# Patient Record
Sex: Male | Born: 1935 | Race: White | Hispanic: No | Marital: Single | Smoking: Former smoker
Health system: Southern US, Community
[De-identification: ages and names within clinical notes are randomized; demographics above are authoritative.]

## PROBLEM LIST (undated history)

## (undated) DIAGNOSIS — R011 Cardiac murmur, unspecified: Secondary | ICD-10-CM

## (undated) DIAGNOSIS — M2550 Pain in unspecified joint: Secondary | ICD-10-CM

## (undated) DIAGNOSIS — M1990 Unspecified osteoarthritis, unspecified site: Secondary | ICD-10-CM

## (undated) DIAGNOSIS — I10 Essential (primary) hypertension: Secondary | ICD-10-CM

## (undated) DIAGNOSIS — I509 Heart failure, unspecified: Secondary | ICD-10-CM

## (undated) DIAGNOSIS — Z961 Presence of intraocular lens: Secondary | ICD-10-CM

## (undated) DIAGNOSIS — H3560 Retinal hemorrhage, unspecified eye: Secondary | ICD-10-CM

## (undated) DIAGNOSIS — M545 Low back pain: Secondary | ICD-10-CM

## (undated) DIAGNOSIS — H189 Unspecified disorder of cornea: Secondary | ICD-10-CM

## (undated) DIAGNOSIS — D181 Lymphangioma, any site: Secondary | ICD-10-CM

## (undated) DIAGNOSIS — R197 Diarrhea, unspecified: Secondary | ICD-10-CM

## (undated) DIAGNOSIS — K219 Gastro-esophageal reflux disease without esophagitis: Secondary | ICD-10-CM

## (undated) DIAGNOSIS — I4891 Unspecified atrial fibrillation: Secondary | ICD-10-CM

## (undated) DIAGNOSIS — K5900 Constipation, unspecified: Secondary | ICD-10-CM

## (undated) DIAGNOSIS — R42 Dizziness and giddiness: Secondary | ICD-10-CM

## (undated) DIAGNOSIS — J449 Chronic obstructive pulmonary disease, unspecified: Secondary | ICD-10-CM

## (undated) DIAGNOSIS — G4730 Sleep apnea, unspecified: Secondary | ICD-10-CM

## (undated) DIAGNOSIS — H26499 Other secondary cataract, unspecified eye: Secondary | ICD-10-CM

## (undated) DIAGNOSIS — I739 Peripheral vascular disease, unspecified: Secondary | ICD-10-CM

## (undated) DIAGNOSIS — J849 Interstitial pulmonary disease, unspecified: Secondary | ICD-10-CM

## (undated) DIAGNOSIS — H0289 Other specified disorders of eyelid: Secondary | ICD-10-CM

## (undated) HISTORY — PX: REPLACEMENT TOTAL KNEE: SUR1224

## (undated) HISTORY — DX: Pain in unspecified joint: M25.50

## (undated) HISTORY — DX: Sleep apnea, unspecified: G47.30

## (undated) HISTORY — DX: Dizziness and giddiness: R42

## (undated) HISTORY — PX: NASAL SINUS SURGERY: SHX719

## (undated) HISTORY — DX: Low back pain: M54.5

## (undated) HISTORY — PX: SHOULDER SURGERY: SHX246

## (undated) HISTORY — DX: Peripheral vascular disease, unspecified: I73.9

## (undated) HISTORY — PX: APPENDECTOMY: SHX54

## (undated) HISTORY — PX: CARPAL TUNNEL RELEASE: SHX101

## (undated) HISTORY — DX: Essential (primary) hypertension: I10

## (undated) HISTORY — PX: EYE SURGERY: SHX253

## (undated) HISTORY — DX: Other specified disorders of eyelid: H02.89

## (undated) HISTORY — DX: Heart failure, unspecified: I50.9

## (undated) HISTORY — PX: TONSILLECTOMY: SUR1361

## (undated) HISTORY — DX: Diarrhea, unspecified: R19.7

## (undated) HISTORY — DX: Other secondary cataract, unspecified eye: H26.499

## (undated) HISTORY — DX: Constipation, unspecified: K59.00

## (undated) HISTORY — DX: Unspecified disorder of cornea: H18.9

## (undated) HISTORY — DX: Interstitial pulmonary disease, unspecified: J84.9

## (undated) HISTORY — DX: Presence of intraocular lens: Z96.1

## (undated) HISTORY — DX: Retinal hemorrhage, unspecified eye: H35.60

## (undated) HISTORY — PX: BACK SURGERY: SHX140

## (undated) HISTORY — PX: JOINT REPLACEMENT: SHX530

## (undated) HISTORY — DX: Lymphangioma, any site: D18.1

## (undated) HISTORY — PX: HERNIA REPAIR: SHX51

## (undated) HISTORY — DX: Chronic obstructive pulmonary disease, unspecified: J44.9

---

## 2009-04-24 ENCOUNTER — Ambulatory Visit: Payer: Self-pay

## 2009-06-17 ENCOUNTER — Ambulatory Visit: Payer: Self-pay | Admitting: General Practice

## 2009-07-01 ENCOUNTER — Ambulatory Visit: Payer: Self-pay | Admitting: General Practice

## 2009-12-12 ENCOUNTER — Ambulatory Visit: Payer: Self-pay | Admitting: General Practice

## 2010-01-26 ENCOUNTER — Ambulatory Visit: Payer: Self-pay | Admitting: General Practice

## 2010-02-08 ENCOUNTER — Inpatient Hospital Stay: Payer: Self-pay | Admitting: General Practice

## 2010-02-13 ENCOUNTER — Encounter: Payer: Self-pay | Admitting: Internal Medicine

## 2010-02-14 ENCOUNTER — Encounter: Payer: Self-pay | Admitting: Internal Medicine

## 2010-03-04 ENCOUNTER — Other Ambulatory Visit: Payer: Self-pay | Admitting: Internal Medicine

## 2010-06-05 ENCOUNTER — Ambulatory Visit: Payer: Self-pay | Admitting: Internal Medicine

## 2010-07-03 ENCOUNTER — Ambulatory Visit: Payer: Self-pay | Admitting: Urology

## 2010-11-03 DIAGNOSIS — M545 Low back pain, unspecified: Secondary | ICD-10-CM

## 2010-11-03 DIAGNOSIS — I739 Peripheral vascular disease, unspecified: Secondary | ICD-10-CM

## 2010-11-03 DIAGNOSIS — I10 Essential (primary) hypertension: Secondary | ICD-10-CM

## 2010-11-03 DIAGNOSIS — J441 Chronic obstructive pulmonary disease with (acute) exacerbation: Secondary | ICD-10-CM | POA: Insufficient documentation

## 2010-11-03 DIAGNOSIS — J449 Chronic obstructive pulmonary disease, unspecified: Secondary | ICD-10-CM

## 2010-11-03 DIAGNOSIS — Z720 Tobacco use: Secondary | ICD-10-CM | POA: Insufficient documentation

## 2010-11-03 HISTORY — DX: Chronic obstructive pulmonary disease, unspecified: J44.9

## 2010-11-03 HISTORY — DX: Essential (primary) hypertension: I10

## 2010-11-03 HISTORY — DX: Low back pain, unspecified: M54.50

## 2010-11-03 HISTORY — DX: Peripheral vascular disease, unspecified: I73.9

## 2010-12-08 DIAGNOSIS — H189 Unspecified disorder of cornea: Secondary | ICD-10-CM | POA: Insufficient documentation

## 2010-12-08 DIAGNOSIS — Z961 Presence of intraocular lens: Secondary | ICD-10-CM

## 2010-12-08 DIAGNOSIS — H0289 Other specified disorders of eyelid: Secondary | ICD-10-CM | POA: Insufficient documentation

## 2010-12-08 HISTORY — DX: Presence of intraocular lens: Z96.1

## 2010-12-08 HISTORY — DX: Other specified disorders of eyelid: H02.89

## 2010-12-08 HISTORY — DX: Unspecified disorder of cornea: H18.9

## 2011-06-15 DIAGNOSIS — M2550 Pain in unspecified joint: Secondary | ICD-10-CM | POA: Insufficient documentation

## 2011-06-15 DIAGNOSIS — Z0000 Encounter for general adult medical examination without abnormal findings: Secondary | ICD-10-CM | POA: Insufficient documentation

## 2011-06-15 DIAGNOSIS — K5900 Constipation, unspecified: Secondary | ICD-10-CM

## 2011-06-15 HISTORY — DX: Pain in unspecified joint: M25.50

## 2011-06-15 HISTORY — DX: Constipation, unspecified: K59.00

## 2011-12-21 DIAGNOSIS — H26499 Other secondary cataract, unspecified eye: Secondary | ICD-10-CM

## 2011-12-21 DIAGNOSIS — Z9889 Other specified postprocedural states: Secondary | ICD-10-CM | POA: Insufficient documentation

## 2011-12-21 HISTORY — DX: Other secondary cataract, unspecified eye: H26.499

## 2012-06-16 ENCOUNTER — Ambulatory Visit: Payer: Self-pay | Admitting: Ophthalmology

## 2012-06-16 LAB — PROTIME-INR
INR: 2.6
Prothrombin Time: 27.1 secs — ABNORMAL HIGH (ref 11.5–14.7)

## 2012-06-25 ENCOUNTER — Ambulatory Visit: Payer: Self-pay | Admitting: Ophthalmology

## 2012-06-25 LAB — PROTIME-INR
INR: 1.7
Prothrombin Time: 20 secs — ABNORMAL HIGH (ref 11.5–14.7)

## 2012-06-26 ENCOUNTER — Ambulatory Visit: Payer: Self-pay | Admitting: Ophthalmology

## 2012-06-26 LAB — PROTIME-INR
INR: 1.3
Prothrombin Time: 16.6 secs — ABNORMAL HIGH (ref 11.5–14.7)

## 2012-10-10 ENCOUNTER — Emergency Department: Payer: Self-pay | Admitting: Emergency Medicine

## 2012-10-10 LAB — PROTIME-INR
INR: 2.5
Prothrombin Time: 26 secs — ABNORMAL HIGH (ref 11.5–14.7)

## 2012-10-10 LAB — CBC
HCT: 45.3 % (ref 40.0–52.0)
HGB: 15.4 g/dL (ref 13.0–18.0)
MCH: 30.6 pg (ref 26.0–34.0)
MCHC: 34.1 g/dL (ref 32.0–36.0)
MCV: 90 fL (ref 80–100)
Platelet: 183 10*3/uL (ref 150–440)
RBC: 5.04 10*6/uL (ref 4.40–5.90)
RDW: 13.8 % (ref 11.5–14.5)
WBC: 9.4 10*3/uL (ref 3.8–10.6)

## 2012-10-10 LAB — COMPREHENSIVE METABOLIC PANEL
Albumin: 3.7 g/dL (ref 3.4–5.0)
Alkaline Phosphatase: 68 U/L (ref 50–136)
Anion Gap: 5 — ABNORMAL LOW (ref 7–16)
BUN: 19 mg/dL — ABNORMAL HIGH (ref 7–18)
Bilirubin,Total: 0.7 mg/dL (ref 0.2–1.0)
Calcium, Total: 9.1 mg/dL (ref 8.5–10.1)
Chloride: 109 mmol/L — ABNORMAL HIGH (ref 98–107)
Co2: 28 mmol/L (ref 21–32)
Creatinine: 1.12 mg/dL (ref 0.60–1.30)
EGFR (African American): >60
EGFR (Non-African Amer.): >60
Glucose: 101 mg/dL — ABNORMAL HIGH (ref 65–99)
Osmolality: 286 (ref 275–301)
Potassium: 4.1 mmol/L (ref 3.5–5.1)
SGOT(AST): 29 U/L (ref 15–37)
SGPT (ALT): 36 U/L (ref 12–78)
Sodium: 142 mmol/L (ref 136–145)
Total Protein: 7.5 g/dL (ref 6.4–8.2)

## 2013-02-23 ENCOUNTER — Ambulatory Visit: Payer: Self-pay | Admitting: Specialist

## 2013-10-13 DIAGNOSIS — J849 Interstitial pulmonary disease, unspecified: Secondary | ICD-10-CM

## 2013-10-13 HISTORY — DX: Interstitial pulmonary disease, unspecified: J84.9

## 2014-03-16 DIAGNOSIS — H3560 Retinal hemorrhage, unspecified eye: Secondary | ICD-10-CM

## 2014-03-16 HISTORY — DX: Retinal hemorrhage, unspecified eye: H35.60

## 2014-05-13 ENCOUNTER — Ambulatory Visit: Payer: Self-pay | Admitting: Nephrology

## 2014-08-06 NOTE — Op Note (Signed)
PATIENT NAME:  Dennis Savage, Dennis Savage MR#:  [id number] DATE OF BIRTH:  Jul 12, 1929  DATE OF PROCEDURE:  06/26/2012  PROCEDURE PERFORMED:  1. Pars plana vitrectomy of the right eye.  2. Removal of subretinal membrane of the right eye.   PREOPERATIVE DIAGNOSES:  1. Choroidal neovascular membrane.  2. Dense subretinal hemorrhage.   POSTOPERATIVE DIAGNOSES:  1. Choroidal neovascular membrane.  2. Dense subretinal hemorrhage.   PRIMARY SURGEON: Teresa Pelton. Naeem Quillin, MD  ANESTHESIA: Retrobulbar block of the right eye with monitored anesthesia care.   COMPLICATIONS: None.   ESTIMATED BLOOD LOSS: Less than 1 mL.   INDICATIONS FOR PROCEDURE: The patient presented to my office with sudden loss of vision approximately 1-1/2 weeks ago. 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 The patient was noted to have vision of hand motion. Risks, benefits and alternatives of the above procedure were discussed, and the patient wished to proceed.   DETAILS OF PROCEDURE: After informed consent was obtained, the patient was brought into the operative suite at [facility]. The patient was placed in supine position and was given a small dose of Alfenta, and a retrobulbar block was performed on the right eye by the primary surgeon without complications. The right eye was prepped and draped in sterile manner. After lid speculum was inserted, a 25-gauge trocar was placed inferotemporally through displaced conjunctiva in an oblique fashion 3 mm beyond the limbus in the inferotemporal quadrant. Infusion cannula was turned on and inserted through the trocar and secured in position with Steri-Strips. Two more trocars were placed in a similar fashion superotemporally and superonasally. Vitreous cutter and light pipe were introduced, and a core vitrectomy was performed. The vitreous face was elevated off of the retina using suction. This was removed,  and the vitreous was trimmed for 360 degrees. Scleral depressed exam was performed for 360 degrees, and no signs of any breaks or tears could be identified for 360 degrees. A 38-gauge cannula was introduced into the eye, and a single retinal hole was created, and tPA was injected subretinally into the macula propria. Concentration of the tPA was 150 mcg/mL. Approximately somewhere between 0.33 and 0.5 mL was injected. A small amount of subretinal air was then injected in order to provide buoyancy to force the blood down. Once this was completed, another scleral depressed exam was performed. No signs of any breaks or tears could be identified. There did seem to be tracking of the tPA inferiorly. The air bubble tracked inferiorly, proving communication. An 85% air-fluid exchange was performed, and endolaser was introduced. Laser was provided from the equator to the ora serrata inferiorly. Once this was complete, the trocars were removed. The 20% SF6 was used as an Acupuncturist. One sclerotomy was noted to be leaky, and a 6-0 plain gut was placed. All the wounds were then noted to be airtight. Dexamethasone 5 mg was given into the inferior fornix. Avastin 1.25 mg was given in a pars plana fashion. The lid speculum was removed, and pressure in the eye was confirmed to be approximately 15 mmHg. The eye was cleaned, and TobraDex was placed on the eye. A patch and shield were placed over the eye, and the patient was taken to postanesthesia care with instruction to remain head up and a forward head tilt.    ____________________________ Teresa Pelton. Starling Manns, MD mfa:OSi D: 06/26/2012 08:06:31 ET T: 06/26/2012 08:33:31 ET JOB#: [id number]  cc: Teresa Pelton. Starling Manns, MD, <Dictator> Deepti Gunawan F  Jevante Hollibaugh MD ELECTRONICALLY SIGNED 07/02/2012 9:27

## 2014-08-10 ENCOUNTER — Ambulatory Visit
Admit: 2014-08-10 | Disposition: A | Discharge status: Home / Self Care | Payer: Self-pay | Attending: Nephrology | Admitting: Nephrology

## 2014-08-13 ENCOUNTER — Ambulatory Visit
Admit: 2014-08-13 | Disposition: A | Discharge status: Home / Self Care | Payer: Self-pay | Attending: Nephrology | Admitting: Nephrology

## 2014-11-04 ENCOUNTER — Other Ambulatory Visit: Payer: Self-pay | Admitting: Specialist

## 2014-11-04 DIAGNOSIS — J849 Interstitial pulmonary disease, unspecified: Secondary | ICD-10-CM

## 2014-11-11 ENCOUNTER — Ambulatory Visit
Admission: RE | Admit: 2014-11-11 | Discharge: 2014-11-11 | Disposition: A | Discharge status: Home / Self Care | Payer: Medicare Other | Source: Ambulatory Visit | Attending: Specialist | Admitting: Specialist

## 2014-11-11 ENCOUNTER — Other Ambulatory Visit: Payer: Self-pay | Admitting: Specialist

## 2014-11-11 DIAGNOSIS — J849 Interstitial pulmonary disease, unspecified: Secondary | ICD-10-CM | POA: Insufficient documentation

## 2014-11-11 DIAGNOSIS — R911 Solitary pulmonary nodule: Secondary | ICD-10-CM | POA: Insufficient documentation

## 2014-11-11 DIAGNOSIS — I2510 Atherosclerotic heart disease of native coronary artery without angina pectoris: Secondary | ICD-10-CM | POA: Insufficient documentation

## 2014-11-11 DIAGNOSIS — K760 Fatty (change of) liver, not elsewhere classified: Secondary | ICD-10-CM | POA: Insufficient documentation

## 2014-11-17 ENCOUNTER — Other Ambulatory Visit: Payer: Self-pay | Admitting: Specialist

## 2014-11-17 DIAGNOSIS — R911 Solitary pulmonary nodule: Secondary | ICD-10-CM

## 2015-02-17 ENCOUNTER — Emergency Department: Payer: Medicare Other

## 2015-02-17 ENCOUNTER — Encounter: Payer: Self-pay | Admitting: Emergency Medicine

## 2015-02-17 ENCOUNTER — Emergency Department
Admission: EM | Admit: 2015-02-17 | Discharge: 2015-02-17 | Disposition: A | Discharge status: Other Acute Inpatient Hospital | Payer: Medicare Other | Attending: Emergency Medicine | Admitting: Emergency Medicine

## 2015-02-17 DIAGNOSIS — Z7901 Long term (current) use of anticoagulants: Secondary | ICD-10-CM | POA: Diagnosis not present

## 2015-02-17 DIAGNOSIS — I10 Essential (primary) hypertension: Secondary | ICD-10-CM | POA: Insufficient documentation

## 2015-02-17 DIAGNOSIS — R531 Weakness: Secondary | ICD-10-CM | POA: Insufficient documentation

## 2015-02-17 DIAGNOSIS — Z87891 Personal history of nicotine dependence: Secondary | ICD-10-CM | POA: Insufficient documentation

## 2015-02-17 DIAGNOSIS — D181 Lymphangioma, any site: Secondary | ICD-10-CM

## 2015-02-17 DIAGNOSIS — Z79899 Other long term (current) drug therapy: Secondary | ICD-10-CM | POA: Insufficient documentation

## 2015-02-17 DIAGNOSIS — R42 Dizziness and giddiness: Secondary | ICD-10-CM | POA: Diagnosis present

## 2015-02-17 DIAGNOSIS — Z880 Allergy status to penicillin: Secondary | ICD-10-CM | POA: Insufficient documentation

## 2015-02-17 HISTORY — DX: Essential (primary) hypertension: I10

## 2015-02-17 HISTORY — DX: Unspecified atrial fibrillation: I48.91

## 2015-02-17 HISTORY — DX: Lymphangioma, any site: D18.1

## 2015-02-17 LAB — DIFFERENTIAL
Basophils Absolute: 0 10*3/uL (ref 0–0.1)
Basophils Relative: 1 %
Eosinophils Absolute: 0.1 10*3/uL (ref 0–0.7)
Eosinophils Relative: 2 %
Lymphocytes Relative: 30 %
Lymphs Abs: 2.6 10*3/uL (ref 1.0–3.6)
Monocytes Absolute: 0.8 10*3/uL (ref 0.2–1.0)
Monocytes Relative: 10 %
Neutro Abs: 5.1 10*3/uL (ref 1.4–6.5)
Neutrophils Relative %: 59 %

## 2015-02-17 LAB — COMPREHENSIVE METABOLIC PANEL
ALT: 34 U/L (ref 17–63)
AST: 35 U/L (ref 15–41)
Albumin: 4 g/dL (ref 3.5–5.0)
Alkaline Phosphatase: 53 U/L (ref 38–126)
Anion gap: 9 (ref 5–15)
BUN: 26 mg/dL — ABNORMAL HIGH (ref 6–20)
CO2: 26 mmol/L (ref 22–32)
Calcium: 9.2 mg/dL (ref 8.9–10.3)
Chloride: 105 mmol/L (ref 101–111)
Creatinine, Ser: 1.1 mg/dL (ref 0.61–1.24)
GFR calc Af Amer: >60 mL/min (ref >60)
GFR calc non Af Amer: >60 mL/min (ref >60)
Glucose, Bld: 109 mg/dL — ABNORMAL HIGH (ref 65–99)
Potassium: 3.6 mmol/L (ref 3.5–5.1)
Sodium: 140 mmol/L (ref 135–145)
Total Bilirubin: 1.1 mg/dL (ref 0.3–1.2)
Total Protein: 6.9 g/dL (ref 6.5–8.1)

## 2015-02-17 LAB — GLUCOSE, CAPILLARY: Glucose-Capillary: 103 mg/dL — ABNORMAL HIGH (ref 65–99)

## 2015-02-17 LAB — CBC
HCT: 47.2 % (ref 40.0–52.0)
Hemoglobin: 16.1 g/dL (ref 13.0–18.0)
MCH: 31.1 pg (ref 26.0–34.0)
MCHC: 34 g/dL (ref 32.0–36.0)
MCV: 91.3 fL (ref 80.0–100.0)
Platelets: 171 10*3/uL (ref 150–440)
RBC: 5.17 MIL/uL (ref 4.40–5.90)
RDW: 13.1 % (ref 11.5–14.5)
WBC: 8.7 10*3/uL (ref 3.8–10.6)

## 2015-02-17 LAB — APTT: aPTT: 26 seconds (ref 24–36)

## 2015-02-17 LAB — TROPONIN I: Troponin I: <0.03 ng/mL (ref <0.031)

## 2015-02-17 LAB — PROTIME-INR
INR: 0.99
Prothrombin Time: 13.3 seconds (ref 11.4–15.0)

## 2015-02-17 NOTE — ED Provider Notes (Addendum)
[facility] Emergency Department Provider Note  ____________________________________________   I have reviewed the triage vital signs and the nursing notes.   HISTORY  Chief Complaint Dizziness    HPI Dennis Savage is a 81 y.o. male with a history of sinus surgery 16 years ago, on Zarontin for atrial fibrillation, no history of CVA no history of stent placement or CAD that he knows of, does have a history of hypertension presents today complaining of being unsteady on his feet "like I'm drunk". His been going on for 5 days. He's had 2 near events where he almost fell. One where he was half on and off the toilet and the other where he had to lean up against the wall. Neither time that he hit his head. He has no recollected head trauma. He does take xarelto. He does state that over the last 6 months he has had recurrent headaches that come every few days which are significant but usually resolved with Tylenol. These headaches are atypical for him. They're usually on the right side. He has not had one for the last 3 days. The patient has no complaints of focal numbness or weakness, he has very poor vision at baseline but there's been no change in that, he has no history of difficulty speaking or other focal neurologic deficit. The patient does state a review of systems and over the last month he has been sometimes winded when he walks around, and occasionally has pain in his shoulders when he walks but he is not having that at this time. He denies any fever or chills or stiff neck. He has no drainage from his nose.  Past Medical History  Diagnosis Date  . Hypertension   . Atrial fibrillation ([place])     There are no active problems to display for this patient.   Past Surgical History  Procedure Laterality Date  . Shoulder surgery    . Replacement total knee    . Tonsillectomy    . Hernia repair    . Back surgery    . Appendectomy      Current Outpatient Rx   Name  Route  Sig  Dispense  Refill  . amLODipine (NORVASC) 10 MG tablet   Oral   Take 10 mg by mouth daily.         . chlorthalidone (HYGROTON) 25 MG tablet   Oral   Take 25 mg by mouth daily as needed.         . docusate sodium (COLACE) 250 MG capsule   Oral   Take 250 mg by mouth daily as needed for constipation.         . Multiple Vitamins-Minerals (PRESERVISION AREDS 2) CAPS   Oral   Take 2 capsules by mouth daily.         . rivaroxaban (XARELTO) 20 MG TABS tablet   Oral   Take 20 mg by mouth daily with supper.           Allergies Penicillins  History reviewed. No pertinent family history.  Social History Social History  Substance Use Topics  . Smoking status: Former Research scientist (life sciences)  . Smokeless tobacco: None  . Alcohol Use: No    Review of Systems Constitutional: No fever/chills Eyes: No visual changes. ENT: No sore throat. No stiff neck no neck pain Cardiovascular: Denies chest pain. Respiratory: Denies shortness of breath. Gastrointestinal:   no vomiting.  No diarrhea.  No constipation. Genitourinary: Negative for dysuria. Musculoskeletal: Negative lower  extremity swelling Skin: Negative for rash. Neurological: Negative for headaches, focal weakness or numbness. 10-point ROS otherwise negative.  ____________________________________________   PHYSICAL EXAM:  VITAL SIGNS: ED Triage Vitals  Enc Vitals Group     BP 02/17/15 1043 136/72 mmHg     Pulse Rate 02/17/15 1043 83     Resp 02/17/15 1043 18     Temp 02/17/15 1043 98 F (36.7 C)     Temp Source 02/17/15 1043 Oral     SpO2 02/17/15 1043 94 %     Weight 02/17/15 1043 217 lb (98.431 kg)     Height 02/17/15 1043 5' 9.5" (1.765 m)     Head Cir --      Peak Flow --      Pain Score --      Pain Loc --      Pain Edu? --      Excl. in [place]? --     Constitutional: Alert and oriented. Well appearing and in no acute distress. Eyes: Conjunctivae are normal. PERRL. EOMI. Head: Atraumatic. Nose:  No congestion/rhinnorhea. Mouth/Throat: Mucous membranes are moist.  Oropharynx non-erythematous. Neck: No stridor.   Nontender with no meningismus Cardiovascular: Normal rate, regular rhythm. Grossly normal heart sounds.  Good peripheral circulation. Respiratory: Normal respiratory effort.  No retractions. Lungs CTAB. Abdominal: Soft and nontender. No distention. No guarding no rebound Back:  There is no focal tenderness or step off there is no midline tenderness there are no lesions noted. there is no CVA tenderness Musculoskeletal: No lower extremity tenderness. No joint effusions, no DVT signs strong distal pulses no edema Cranial nerves II through XII are grossly intact 5 out of 5 strength bilateral upper and lower extremity. Finger to nose within normal limits heel to shin within normal limits, speech is normal with no word finding difficulty ordered dysarthria, reflexes symmetric pupils are equally round and reactive to light there is no pronator drift, sensation is normal, normal neurologic exam negative Romberg Skin:  Skin is warm, dry and intact. No rash noted. Psychiatric: Mood and affect are normal. Speech and behavior are normal.  ____________________________________________   LABS (all labs ordered are listed, but only abnormal results are displayed)  Labs Reviewed  COMPREHENSIVE METABOLIC PANEL - Abnormal; Notable for the following:    Glucose, Bld 109 (*)    BUN 26 (*)    All other components within normal limits  GLUCOSE, CAPILLARY - Abnormal; Notable for the following:    Glucose-Capillary 103 (*)    All other components within normal limits  PROTIME-INR  APTT  CBC  DIFFERENTIAL  TROPONIN I  CBG MONITORING, ED   ____________________________________________  EKG  I personally interpreted EKG, atrial fibrillation with occasional PVC. Right bundle-branch block noted no acute ischemic changes rate is  72 ____________________________________________  RADIOLOGY  Personally reviewed the films ____________________________________________   PROCEDURES  Procedure(s) performed: None  Critical Care performed: None  ____________________________________________   INITIAL IMPRESSION / ASSESSMENT AND PLAN / ED COURSE  Pertinent labs & imaging results that were available during my care of the patient were reviewed by me and considered in my medical decision making (see chart for details).  Patient is neurological intact however he is on a blood thinner and he has a headache from a suggested that he probably has had a headache injury in the recent past. He has also had extensive sinus surgery, there is no evidence of a sign CSF leak. He also has been having symptoms of lightheadedness and difficulty  with ambulation. This could be secondary to the hygroma or other pathology. The patient additionally complains of exertional dyspnea and occasional shoulder pain when he walks as well. He is not having any symptoms of angina at this time. For these and many reasons I think the patient probably would benefit from admission for observation in the hospital. However we do not have neurosurgery here. I will discuss with [facility] with her thoughts are.  ----------------------------------------- 1:48 PM on 02/17/2015 ----------------------------------------- Given that there is no clear inciting cause for this and patient did have a significant headache last week the concern exists for possible spontaneous subdural bleed although again trauma is the most likely. Patient has no recollection of a trauma however and he does seem to be quite with it. I did discuss with the hospitalist here and they feel he would be better served in place with neurosurgical backup. This is not unreasonable. I then talked to Dr. Forbes Cellar at [facility] ED who very graciously accepted the patient onto her service. 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     ____________________________________________   FINAL CLINICAL IMPRESSION(S) / ED DIAGNOSES  Final diagnoses:  None     Schuyler Amor, MD 02/17/15 [place], MD 02/17/15 1349

## 2015-02-17 NOTE — ED Notes (Signed)
Patient has been dizzy and lightheaded for 5 days and off balance.  Has fallen twice since sx started.  C/o weakness in both arms.  No speech changes.

## 2015-02-25 DIAGNOSIS — R42 Dizziness and giddiness: Secondary | ICD-10-CM

## 2015-02-25 DIAGNOSIS — R197 Diarrhea, unspecified: Secondary | ICD-10-CM

## 2015-02-25 HISTORY — DX: Diarrhea, unspecified: R19.7

## 2015-02-25 HISTORY — DX: Dizziness and giddiness: R42

## 2015-07-06 ENCOUNTER — Ambulatory Visit: Payer: Self-pay | Admitting: Urology

## 2015-07-13 ENCOUNTER — Ambulatory Visit (INDEPENDENT_AMBULATORY_CARE_PROVIDER_SITE_OTHER): Payer: Medicare Other | Admitting: Urology

## 2015-07-13 ENCOUNTER — Encounter: Payer: Self-pay | Admitting: Urology

## 2015-07-13 VITALS — BP 149/77 | HR 97 | Ht 69.5 in | Wt 211.0 lb

## 2015-07-13 DIAGNOSIS — N400 Enlarged prostate without lower urinary tract symptoms: Secondary | ICD-10-CM | POA: Diagnosis not present

## 2015-07-13 DIAGNOSIS — Q6100 Congenital renal cyst, unspecified: Secondary | ICD-10-CM

## 2015-07-13 DIAGNOSIS — Z9889 Other specified postprocedural states: Secondary | ICD-10-CM

## 2015-07-13 DIAGNOSIS — N281 Cyst of kidney, acquired: Secondary | ICD-10-CM

## 2015-07-13 DIAGNOSIS — I4891 Unspecified atrial fibrillation: Secondary | ICD-10-CM | POA: Insufficient documentation

## 2015-07-13 DIAGNOSIS — R351 Nocturia: Secondary | ICD-10-CM

## 2015-07-13 DIAGNOSIS — G4730 Sleep apnea, unspecified: Secondary | ICD-10-CM

## 2015-07-13 DIAGNOSIS — N50811 Right testicular pain: Secondary | ICD-10-CM | POA: Diagnosis not present

## 2015-07-13 HISTORY — DX: Sleep apnea, unspecified: G47.30

## 2015-07-13 LAB — URINALYSIS, COMPLETE
Bilirubin, UA: NEGATIVE
Glucose, UA: NEGATIVE
Ketones, UA: NEGATIVE
Leukocytes, UA: NEGATIVE
Nitrite, UA: NEGATIVE
Specific Gravity, UA: 1.02 (ref 1.005–1.030)
Urobilinogen, Ur: 0.2 mg/dL (ref 0.2–1.0)
pH, UA: 5 (ref 5.0–7.5)

## 2015-07-13 LAB — MICROSCOPIC EXAMINATION

## 2015-07-13 LAB — BLADDER SCAN AMB NON-IMAGING

## 2015-07-13 MED ORDER — TAMSULOSIN HCL 0.4 MG PO CAPS: 0.4000 mg | ORAL_CAPSULE | Freq: Every day | ORAL | Status: DC

## 2015-07-13 NOTE — Progress Notes (Signed)
07/13/2015 5:28 PM   Dennis Savage 1929/11/13 [id number]  Referring provider: [name], MD [facility] [place], [place] [address]  Chief Complaint  Patient presents with  . Benign Prostatic Hypertrophy    New Patient    HPI: 82 yo M referred by Dr. Gilford Rile for evaluation of nocturia.   Nocturia/ LUTS Patient reports that he gets up 5x nightly over the past month which is increased from 2-3 x.  No significant daytime urgency, frequency, or urge incontinence.  He does have a weak stream and sits to void.  He does sometimes double void and does not feel that he is able to completely emtpy at times.     He does have untreated OSA.  He was dx 15 years ago but was unable to tolerate CPAP. He denies leg swelling. He is currently on a diuretic.  He does avoid drinking in the evening time.  IPSS 17/2 (see below)  PVR 15 mL.    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   No testicular swelling. He puts a pillow between his legs at night to help with the discomfort. He has switched from boxers to boxer briefs.  No dysuria or gross hematuria.  History of bladder lesion He also has a history of ? Transurethral surgery for a bladder lesion by [name] ~4 years ago.  He does not think that he has a  history of bladder cancer. Pathology and surgical dictation unavailable today. We'll request records.   Renal cyst   Patient was told he has renal cysts by his nephrologist (Dr.Singh approximately one year ago). 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 In addition, it appears that there was a hyperattenuating/ hyper enhancing exophytic lesion on  the lower pole of the right kidney measuring 1.9 cm and other complex cysts. Unable to review images today report only.       IPSS      07/13/15 1400       International Prostate Symptom Score   How often have you had the sensation of not emptying your bladder? Less than half the time     How often have you had to urinate less than every two hours? More than half the time     How often have you found you stopped and started again several times when you urinated? Less than half the time     How often have you found it difficult to postpone urination? Less than half the time     How often have you had a weak urinary stream? Less than half the time     How often have you had to strain to start urination? Not at All     How many times did you typically get up at night to urinate? 5 Times     Total IPSS Score 17     Quality of Life due to urinary symptoms   If you were to spend the rest of your life with your urinary condition just the way it is now how would you feel about that? Mostly Satisfied        Score:  1-7 Mild 8-19 Moderate  20-35 Severe   PMH: Past Medical History  Diagnosis Date  . Hypertension   . Atrial fibrillation ([place])   . Benign essential HTN 11/03/2010  . Peripheral vascular disease ([place]) 11/03/2010  . Chronic obstructive pulmonary disease ([place]) 11/03/2010  . Interstitial lung disease ([place]) 10/13/2013  . Acute diarrhea 02/25/2015  . CN (constipation) 06/15/2011  . After cataract not obscuring vision 12/21/2011  . 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 Apnea, sleep 07/13/2015  . Arthralgia of multiple joints 06/15/2011  . Artificial lens present 12/08/2010  . 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 Dizziness 02/25/2015  . LBP (low back pain) 11/03/2010  . 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 Retinal hemorrhage 03/16/2014    Surgical History: Past Surgical History  Procedure Laterality Date  . Shoulder surgery    . Replacement total knee    . Tonsillectomy    . Hernia  repair    . Back surgery    . Appendectomy    . Carpal tunnel release    . Nasal sinus surgery      Home Medications:    Medication List       This list is accurate as of: 07/13/15  5:28 PM.  Always use your most recent med list.               amLODipine 10 MG tablet  Commonly known as:  NORVASC  Take 10 mg by mouth daily.     chlorthalidone 25 MG tablet  Commonly known as:  HYGROTON  Take 25 mg by mouth daily as needed.     docusate sodium 250 MG capsule  Commonly known as:  COLACE  Take 250 mg by mouth daily as needed for constipation.     PRESERVISION AREDS 2 Caps  Take 2 capsules by mouth daily.     tamsulosin 0.4 MG Caps capsule  Commonly known as:  FLOMAX  Take 1 capsule (0.4 mg total) by mouth daily.     XARELTO 20 MG Tabs tablet  Generic drug:  rivaroxaban  Take 20 mg by mouth daily with supper.        Allergies:  Allergies  Allergen Reactions  . Penicillins Swelling    Family History: Family History  Problem Relation Age of Onset  . Bladder Cancer Neg Hx   . Kidney cancer Neg Hx   . Prostate cancer Neg Hx     Social History:  reports that he has quit smoking. He does not have any smokeless tobacco history on file. He reports that he does not drink alcohol or use illicit drugs.  ROS: UROLOGY Frequent Urination?: Yes Hard to postpone urination?: Yes Burning/pain with urination?: No Get up at night to urinate?: Yes Leakage of urine?: No Urine stream starts and stops?: No Trouble starting stream?: No Do you have to strain to urinate?: No Blood in urine?: No Urinary tract infection?: No Sexually transmitted disease?: No Injury to kidneys or bladder?: No Painful intercourse?: No Weak stream?: Yes Erection problems?: No Penile pain?: No  Gastrointestinal Nausea?: No Vomiting?: No Indigestion/heartburn?: No Diarrhea?: No Constipation?: Yes  Constitutional Fever: No Night sweats?: No Weight loss?: No Fatigue?: No  Skin Skin  rash/lesions?: No Itching?: No  Eyes Blurred vision?: Yes Double vision?: No  Ears/Nose/Throat Sore throat?: Yes Sinus problems?: No  Hematologic/Lymphatic Swollen glands?: No Easy bruising?: Yes  Cardiovascular Leg swelling?: No Chest pain?: No  Respiratory Cough?: Yes Shortness of breath?: No  Endocrine Excessive thirst?: No  Musculoskeletal Back pain?: Yes Joint pain?: Yes  Neurological Headaches?: Yes Dizziness?: Yes  Psychologic Depression?: No Anxiety?: No  Physical Exam: BP 149/77 mmHg  Pulse 97  Ht 5' 9.5" (1.765 m)  Wt 211 lb (95.709 kg)  BMI 30.72 kg/m2  Constitutional:  Alert and oriented, No acute distress. HEENT: [place] AT, moist mucus membranes.  Trachea midline, no masses. Cardiovascular: No clubbing, cyanosis, or edema. Respiratory: Normal respiratory effort, no increased work of breathing. GI: Abdomen is soft, nontender, nondistended, no abdominal masses GU: No CVA tenderness. Circumcised phallus.  Mild right testicular tenderness but no evidence of epididymo-orchitis.  Left testicle normal.   Rectal: + external hemorrhoids.  Normal sphincter tone.  20 cc prostate, nontender.   Skin: No rashes, bruises or suspicious lesions. Lymph: No cervical or inguinal adenopathy. Neurologic: Grossly intact, no focal deficits, moving all 4 extremities.  Mild hand tremor. Psychiatric: Normal mood and affect.  Laboratory Data: Lab Results  Component Value Date   WBC 8.7 02/17/2015   HGB 16.1 02/17/2015   HCT 47.2 02/17/2015   MCV 91.3 02/17/2015   PLT 171 02/17/2015    Lab Results  Component Value Date   CREATININE 1.10 02/17/2015    Urinalysis UA today reviewed.  2+ blood on dip but negative RBC on HPF.  3+ protein.  Pertinent Imaging: Study Result     CLINICAL DATA: Left flank pain for 6 days  EXAM: RENAL / URINARY TRACT ULTRASOUND COMPLETE  COMPARISON: 05/13/2014  FINDINGS: Right Kidney:  Length: 13.7 cm. Echogenicity  within normal limits. No mass or hydronephrosis visualized. Two cysts laterally in the lower pole the larger measuring 7 cm.  Left Kidney:  Length: 14.1 cm. Echogenicity within normal limits. No mass or hydronephrosis visualized. Lower pole cyst measures 6 cm.  Bladder:  Normal for degree of distention, but not well evaluated as it was not distended.  IMPRESSION: There are no acute findings. Renal cysts.   Electronically Signed  By: Skipper Cliche M.D.  On: 08/13/2014 16:11   CT abdomen pelvis with contrast11/12/2014  [place]  Result Narrative  CT abdomen and pelvis with IV contrast,      Comparison:  No comparison.  Indication:  R10.84 Generalized abdominal pain, Eval for diverticulitis.  Technique:  CT imaging was performed of the abdomen and pelvis following the uncomplicated administration of intravenous contrast (Isovue-300, 150 mL at 3 mL/sec).  Iodinated contrast was used due to the indications for the examination, to improve disease detection and further define anatomy. The most recent serum creatinine is 1.3 mg/dL.   Coronal reformatted images were generated and reviewed.  Findings:  There is mild dependent atelectasis bilaterally. There are small right-sided pleural calcifications.  The liver is normal in size and contour. There is a 2.0 cm hypoattenuating/hypoenhancing lesion in the mid liver (series 5 image 31), most likely a hepatic cyst. There is a nodular too small to characterize lesion in the inferior posterior right lobe (series 5 image 50). The portal and hepatic veins are patent. There is steatosis of the liver. The gallbladder is fluid-filled without radiopaque stones within the lumen. There is no intrahepatic or extra hepatic biliary ductal dilation. There is central sent fatty infiltration of the pancreas. The spleen is unremarkable. There is a 2.9 cm left adrenal nodule with indeterminate attenuation  characteristics. There is a 2.4 cm right adrenal nodule with heterogeneous fatty attenuation consistent with an adrenal myelolipoma.  There are multiple cysts within the bilateral kidneys. There is a hyperattenuating/hyper enhancing exophytic lesion located on the lower pole  of the right kidney which measures 1.9 cm in diameter. There is the suggestion of a thickened enhancing wall. Additionally there is a 1.9 cm cystic lesion in the superior pole of the left kidney with the suggestion of an enhancing thin septa.  There is a 4 mm calcified stone located in the left upper pole located within the collecting system. There is no hydronephrosis, hydroureter, or evidence of obstruction. The bladder is fluid-filled. The prostate and seminal vesicles are unremarkable.  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 There is thickening of the terminal ileum with increased mural enhancement. The ascending and transverse colon are decompressed, however there is the suggestion of mild bowel wall thickening. The surrounding mesentery is normal in appearance. There are are no intraperitoneal fluid collections. There is no evidence of obstruction.  There are scattered atherosclerotic calcifications of the abdominal aorta and its major branches. The abdominal vasculature is otherwise unremarkable. There is no retroperitoneal or mesenteric adenopathy by CT criteria. There are multilevel degenerative changes of the thoracic and lumbar spine as well as the pelvis. There are no focal osseous lesions.  Impression: 1.Thickening of the terminal ileum, likely enteritis. The colon is decompressed throughout, giving the appearance of wall thickening. Differential considerations include ischemic, infectious and inflammatory etiologies. The mesenteric vasculature is patent. No pneumatosis. 2.Indeterminate right renal lesion. A dedicated CT of the kidneys without and with intravenous  is recommended for further evaluation. 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 This could be further evaluated at the time of dedicated renal mass workup.  Discussed  02/23/2015 8:19 PM by Garlan Fillers, MD with Dr. Rogue Bussing  Electronically Reviewed by:  Garlan Fillers, MD Electronically Reviewed on:  02/24/2015 10:51 AM  I have reviewed the images and concur with the above findings.  Electronically Signed by:  Art Buff, MD Electronically Signed on:  02/24/2015 2:19 PM     Assessment & Plan:    1. Nocturia 82 year old with primary complaint of nocturia up to 5 times only. We discussed the pathophysiology of nocturia and that the etiology is multifactorial. Prostate white small on exam today and is not likely a significant contributing factor.  Emptying bladder well today with PVR. -Recommend avoidance of fluids after 6 PM with a 10 PM bedtime -Discussed importance of compliance with CPAP, have recommended that he follow back up with his PCP and consider repeat sleep study -Discussed nocturnal polyuria as it relates to aging -Will try Flomax for a few weeks to see if this helps, advised to stop if no benefit - Urinalysis, Complete - BLADDER SCAN AMB NON-IMAGING  2. Renal cyst Incidental renal cyst, follow-up CT with contrast at [facility] from 02/2015 somewhat suspicious Recommend follow-up CT abdomen with and without contrast (dedicated renal study) at 6 month interval which would be May for further follow-up/evaluation - CT Abd Wo & W Cm; Future  3. History of transurethral destruction of bladder lesion Unclear history of some sort of transurethral procedure, patient thinks for benign lesion. Records have been requested today  4. Right testicular pain Exam today benign, recommend supportive care   Return in about 2 months (around 09/12/2015) for f/u CT abd, please try to find old [facility] records.  Hollice Espy, MD  [facility] [address], [place], [place] [address] [phone]   I spent 45 min with this patient of which greater than 50% was spent in counseling and coordination of care with the patient.

## 2015-08-24 ENCOUNTER — Ambulatory Visit
Admission: RE | Admit: 2015-08-24 | Discharge: 2015-08-24 | Disposition: A | Discharge status: Home / Self Care | Payer: Medicare Other | Source: Ambulatory Visit | Attending: Urology | Admitting: Urology

## 2015-08-24 DIAGNOSIS — N200 Calculus of kidney: Secondary | ICD-10-CM | POA: Diagnosis not present

## 2015-08-24 DIAGNOSIS — K760 Fatty (change of) liver, not elsewhere classified: Secondary | ICD-10-CM | POA: Diagnosis not present

## 2015-08-24 DIAGNOSIS — N281 Cyst of kidney, acquired: Secondary | ICD-10-CM

## 2015-08-24 DIAGNOSIS — Q6100 Congenital renal cyst, unspecified: Secondary | ICD-10-CM | POA: Diagnosis present

## 2015-08-24 LAB — POCT I-STAT CREATININE: Creatinine, Ser: 1.1 mg/dL (ref 0.61–1.24)

## 2015-08-24 MED ORDER — IOPAMIDOL (ISOVUE-300) INJECTION 61%
100.0000 mL | Freq: Once | INTRAVENOUS | Status: AC | PRN
  Administered 2015-08-24: 100 mL via INTRAVENOUS

## 2015-09-12 DIAGNOSIS — M169 Osteoarthritis of hip, unspecified: Secondary | ICD-10-CM | POA: Insufficient documentation

## 2015-09-14 ENCOUNTER — Ambulatory Visit (INDEPENDENT_AMBULATORY_CARE_PROVIDER_SITE_OTHER): Payer: Medicare Other | Admitting: Urology

## 2015-09-14 ENCOUNTER — Encounter: Payer: Self-pay | Admitting: Urology

## 2015-09-14 VITALS — BP 125/67 | HR 82 | Ht 69.5 in | Wt 212.0 lb

## 2015-09-14 DIAGNOSIS — Q6100 Congenital renal cyst, unspecified: Secondary | ICD-10-CM | POA: Diagnosis not present

## 2015-09-14 DIAGNOSIS — E278 Other specified disorders of adrenal gland: Secondary | ICD-10-CM

## 2015-09-14 DIAGNOSIS — R351 Nocturia: Secondary | ICD-10-CM

## 2015-09-14 DIAGNOSIS — N281 Cyst of kidney, acquired: Secondary | ICD-10-CM

## 2015-09-14 DIAGNOSIS — N200 Calculus of kidney: Secondary | ICD-10-CM | POA: Diagnosis not present

## 2015-09-14 DIAGNOSIS — E279 Disorder of adrenal gland, unspecified: Secondary | ICD-10-CM

## 2015-09-14 NOTE — Progress Notes (Signed)
1:43 PM  09/14/2015  Dennis Savage [birth year] [id number]  Referring provider: [name], MD [facility] [place], [place] [address]  Chief Complaint  Patient presents with  . Renal Cyst    CTscan results    HPI: 82 yo M Who returned today for follow-up of multiple GI issues including: CT abdomen pelvis with and without contrast.  Nocturia/ LUTS Patient reports that he gets up 3x nightly, improved   No significant daytime urgency, frequency, or urge incontinence.  He does have a weak stream and sits to void.  He does sometimes double void and does not feel that he is able to completely emtpy at times.    He does have untreated OSA.  He was dx 15 years ago but was unable to tolerate CPAP. He denies leg swelling. He is currently on a diuretic.   He was prescribed Flomax last visit as a trial but never filled this prescription. There was some confusion about whether or not the prescription had been sent electronically.   Right testicular pain Resolved since last visit  History of bladder lesion He also has a history of ? Transurethral surgery for a bladder lesion by [name] ~4 years ago.  He does not think that he has a  history of bladder cancer. Pathology and surgical dictation unavailable. Unable to locate records.  Renal cyst   Patient was told he has renal cysts by his nephrologist (Dr.Singh approximately one year ago). 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 In addition, it appears that there was a hyperattenuating/ hyper enhancing exophytic lesion on the lower pole of the right kidney measuring 1.9 cm and other complex cysts on 02/2015.  Repeat CT abd/ pelvis with and without contrast (dedicated renal mass protocol) on 08/24/2015, the  indeterminate left lower pole lesion has remained stable in size 19 mm and has equivocal/ low level enhancement.    Nephrolithiasis Incidental 4 mm left upper pole stone on CT scan. Asymptomatic.   PMH: Past Medical History  Diagnosis Date  . Hypertension   . Atrial fibrillation ([place])   . Benign essential HTN 11/03/2010  . Peripheral vascular disease ([place]) 11/03/2010  . Chronic obstructive pulmonary disease ([place]) 11/03/2010  . Interstitial lung disease ([place]) 10/13/2013  . Acute diarrhea 02/25/2015  . CN (constipation) 06/15/2011  . After cataract not obscuring vision 12/21/2011  . 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 Apnea, sleep 07/13/2015  . Arthralgia of multiple joints 06/15/2011  . Artificial lens present 12/08/2010  . 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 Dizziness 02/25/2015  . LBP (low back pain) 11/03/2010  . 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 Retinal hemorrhage 03/16/2014    Surgical History: Past Surgical History  Procedure Laterality Date  . Shoulder surgery    . Replacement total knee    . Tonsillectomy    . Hernia repair    . Back surgery    . Appendectomy    . Carpal tunnel release    . Nasal sinus surgery      Home Medications:    Medication List       This list is accurate as of: 09/14/15  1:43 PM.  Always use your most recent med list.  amLODipine 10 MG tablet  Commonly known as:  NORVASC  Take 10 mg by mouth daily.     chlorthalidone 25 MG tablet  Commonly known as:  HYGROTON  Take 25 mg by mouth daily as needed.     docusate sodium 250 MG capsule  Commonly known as:  COLACE  Take 250 mg by mouth daily as needed for constipation.     PRESERVISION AREDS 2 Caps  Take 2 capsules by mouth daily.     tamsulosin 0.4 MG Caps capsule  Commonly known as:  FLOMAX  Take 1 capsule (0.4 mg total) by mouth daily.     XARELTO 20 MG Tabs tablet  Generic drug:  rivaroxaban  Take 20 mg by mouth daily with supper.        Allergies:  Allergies    Allergen Reactions  . Penicillins Swelling    Family History: Family History  Problem Relation Age of Onset  . Bladder Cancer Neg Hx   . Kidney cancer Neg Hx   . Prostate cancer Neg Hx     Social History:  reports that he has quit smoking. He does not have any smokeless tobacco history on file. He reports that he does not drink alcohol or use illicit drugs.  ROS: UROLOGY Frequent Urination?: No Hard to postpone urination?: No Burning/pain with urination?: No Get up at night to urinate?: No Leakage of urine?: No Urine stream starts and stops?: No Trouble starting stream?: No Do you have to strain to urinate?: No Blood in urine?: No Urinary tract infection?: No Sexually transmitted disease?: No Injury to kidneys or bladder?: No Painful intercourse?: No Weak stream?: No Erection problems?: No Penile pain?: No  Gastrointestinal Nausea?: No Vomiting?: No Indigestion/heartburn?: No Diarrhea?: No Constipation?: No  Constitutional Fever: No Night sweats?: No Weight loss?: No Fatigue?: No  Skin Skin rash/lesions?: No Itching?: No  Eyes Blurred vision?: No Double vision?: No  Ears/Nose/Throat Sore throat?: No Sinus problems?: No  Hematologic/Lymphatic Swollen glands?: No Easy bruising?: No  Cardiovascular Leg swelling?: No Chest pain?: No  Respiratory Cough?: No Shortness of breath?: No  Endocrine Excessive thirst?: No  Musculoskeletal Back pain?: No Joint pain?: No  Neurological Headaches?: No Dizziness?: No  Psychologic Depression?: No Anxiety?: No  Physical Exam: BP 125/67 mmHg  Pulse 82  Ht 5' 9.5" (1.765 m)  Wt 212 lb (96.163 kg)  BMI 30.87 kg/m2  Constitutional:  Alert and oriented, No acute distress.  Presents today with his daughter.   HEENT: [place] AT, moist mucus membranes.  Trachea midline, no masses. Cardiovascular: No clubbing, cyanosis, or edema. Respiratory: Normal respiratory effort, no increased work of breathing. GI:  Abdomen is soft, nontender, nondistended, no abdominal masses.  Obese.   Rectal: Deferred today, unremarkable last visit Skin: No rashes, bruises or suspicious lesions. Lymph: No cervical or inguinal adenopathy. Neurologic: Grossly intact, no focal deficits, moving all 4 extremities.  Mild hand tremor.  Ambulating with walker.   Psychiatric: Normal mood and affect.  Laboratory Data: Lab Results  Component Value Date   WBC 8.7 02/17/2015   HGB 16.1 02/17/2015   HCT 47.2 02/17/2015   MCV 91.3 02/17/2015   PLT 171 02/17/2015    Lab Results  Component Value Date   CREATININE 1.10 08/24/2015     Pertinent Imaging:    Study Result     CLINICAL DATA: Follow up renal ultrasound demonstrating bilateral cysts. No current complaints. History of benign bladder tumors per patient.  EXAM: CT ABDOMEN WITHOUT AND  WITH CONTRAST  TECHNIQUE: Multidetector CT imaging of the abdomen was performed following the standard protocol before and following the bolus administration of intravenous contrast.  CONTRAST: 113mL ISOVUE-300 IOPAMIDOL (ISOVUE-300) INJECTION 61%  COMPARISON: Renal ultrasound 08/13/2014. Chest CT 11/11/2014 and 02/23/2013.  FINDINGS: Lower chest: Stable calcified subpleural nodule or pleural plaque posteriorly at the right lung base on image 12. No suspicious pulmonary nodules. No pleural effusion. A small amount of pericardial fluid and mild cardiomegaly are unchanged.  Hepatobiliary: As demonstrated on prior chest CTs, the liver demonstrates heterogeneous low density consistent with steatosis. Best seen on the portal phase images is a 1.7 cm low-density lesion in the left lobe on image 26 of series 5. There is another smaller lesion posteriorly on the same image. The dominant lesion appears grossly unchanged from the 2014 chest CT. No evidence of gallstones, gallbladder wall thickening or biliary dilatation.  Pancreas: Unremarkable. No pancreatic  ductal dilatation or surrounding inflammatory changes.  Spleen: Normal in size without focal abnormality.  Adrenals/Urinary Tract: 2.2 cm low-density right adrenal nodule is unchanged from 2014, consistent with an adenoma or myelolipoma. There is a left adrenal nodule as well, similar in size to the prior CT, measuring 2.9 x 2.6 cm. This lesion is higher in density than on the prior study, measuring 36 HU prior to contrast administration, 44 HU on the immediate postcontrast images and 56 HU on the delayed images. These enhancement and washout characteristics are nonspecific. Previously, this did appear to reflect an adenoma. Pre contrast images demonstrate a 4 mm nonobstructing calculus in the upper pole of the left kidney. No other urinary tract calculi are seen. There is no evidence of hydronephrosis or proximal ureteral calculus. There are bilateral renal cysts, the largest in the lower poles, measuring up to 7.2 cm on the right and 6.1 cm on the left. Most of these appear simple with low-density and no abnormal enhancement. There is a mildly complex lesion anteriorly in the lower pole the left kidney, measuring 18 mm on image 70 series 5. This measures slightly higher than water density, although demonstrates no significant enhancement. There is an indeterminate lesion laterally in the lower pole of the right kidney. This measures 19 mm on image 64 of series 5. It measures approximately 32 HU prior to contrast administration, 37 HU on the immediate postcontrast images and 46-52 HU on the delayed post-contrast images.  Stomach/Bowel: No evidence of bowel wall thickening, distention or surrounding inflammatory change.Prominent gastric fundal diverticulum again noted.  Vascular/Lymphatic: There are stable small lymph nodes within the porta hepatis. There is no retroperitoneal lymphadenopathy. Stable aortic and branch vessel atherosclerosis.  Other:  None.  Musculoskeletal: No acute or significant osseous findings. There is moderate multilevel lumbar spondylosis. Postsurgical changes are present within the lower lumbar spine.  IMPRESSION: 1. Bilateral renal cystic lesions, mostly simple. 1.9 cm lesion in the lower pole of the right kidney demonstrates possible low-level enhancement, indeterminate for neoplasm. This could be more definitively characterized with renal MRI. Alternatively, given the patient's age, follow-up CT in 6-12 months could be performed. 2. Interval increased density within the otherwise stable left adrenal nodule, possibly hemorrhage into an adenoma. Small right adrenal nodule appears unchanged. 3. Nonobstructing calculus in the upper pole of the left kidney. 4. Heterogeneous hepatic steatosis with grossly stable low-density lesion in the left.   Electronically Signed  By: Richardean Sale M.D.  On: 08/24/2015 10:38   DT scan reviewed personally today and with the patient and his daughter.  Assessment & Plan:    1. Renal cyst/ mass Dedicated CT abdomen and pelvis with contrast again equivocal. Patient is mildly suspicious for a small right lower pole renal mass measuring 19 mm that certainly not diagnostic.   We discussed this in detail and in regards to the spectrum of renal masses which includes cysts (pure cysts are considered benign), solid masses and everything in between. The risk of metastasis increases as the size of solid renal mass increases. In general, it is believed that the risk of metastasis for renal masses less than 3-4 cm is small (up to approximately 5%) based mainly on large retrospective studies.  In some cases and especially in patients of older age and multiple comorbidities a surveillance approach may be appropriate. The treatment of solid renal masses includes: surveillance, cryoablation (percutaneous and laparoscopic), RFA in addition to partial and complete nephrectomy (each  with option of laparoscopic, robotic and open depending on appropriateness).   Given age and comorbidities as well as the stable size of the lesion over the past 6 months, I would recommend further characterization in 6 months with an MRI. If lesion remained stable at that time, may continue to follow annually with renal ultrasound.  - MR Abdomen W Wo Contrast; Future  2. Nocturia Improving, down to 3 times nightly. He will fill the Flomax prescription and try for 30 days, may or may not continue based on relief of symptoms  3. 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  4. Nephrolithiasis Incidental 4 mm left nonobstructing renal stone, asymptomatic. No intervention recommended.   Return in about 6 months (around 03/15/2016) for f/u MRI.  Hollice Espy, MD  [facility] [address] [place], [place] [address] [phone]   I spent 45 min with this patient of which greater than 50% was spent in counseling and coordination of care with the patient.

## 2015-09-28 ENCOUNTER — Other Ambulatory Visit: Payer: Medicare Other

## 2015-10-05 ENCOUNTER — Encounter
Admission: RE | Admit: 2015-10-05 | Discharge: 2015-10-05 | Disposition: A | Discharge status: Home / Self Care | Payer: Medicare Other | Source: Ambulatory Visit | Attending: Orthopedic Surgery | Admitting: Orthopedic Surgery

## 2015-10-05 DIAGNOSIS — I4891 Unspecified atrial fibrillation: Secondary | ICD-10-CM | POA: Diagnosis not present

## 2015-10-05 DIAGNOSIS — Z01812 Encounter for preprocedural laboratory examination: Secondary | ICD-10-CM | POA: Insufficient documentation

## 2015-10-05 DIAGNOSIS — R197 Diarrhea, unspecified: Secondary | ICD-10-CM | POA: Insufficient documentation

## 2015-10-05 DIAGNOSIS — Z720 Tobacco use: Secondary | ICD-10-CM | POA: Insufficient documentation

## 2015-10-05 DIAGNOSIS — J449 Chronic obstructive pulmonary disease, unspecified: Secondary | ICD-10-CM | POA: Diagnosis not present

## 2015-10-05 DIAGNOSIS — M1611 Unilateral primary osteoarthritis, right hip: Secondary | ICD-10-CM | POA: Diagnosis present

## 2015-10-05 DIAGNOSIS — I4510 Unspecified right bundle-branch block: Secondary | ICD-10-CM | POA: Diagnosis not present

## 2015-10-05 DIAGNOSIS — G4730 Sleep apnea, unspecified: Secondary | ICD-10-CM | POA: Insufficient documentation

## 2015-10-05 DIAGNOSIS — I10 Essential (primary) hypertension: Secondary | ICD-10-CM | POA: Insufficient documentation

## 2015-10-05 DIAGNOSIS — I739 Peripheral vascular disease, unspecified: Secondary | ICD-10-CM | POA: Insufficient documentation

## 2015-10-05 DIAGNOSIS — Z01810 Encounter for preprocedural cardiovascular examination: Secondary | ICD-10-CM | POA: Insufficient documentation

## 2015-10-05 DIAGNOSIS — J849 Interstitial pulmonary disease, unspecified: Secondary | ICD-10-CM | POA: Insufficient documentation

## 2015-10-05 DIAGNOSIS — H26499 Other secondary cataract, unspecified eye: Secondary | ICD-10-CM | POA: Insufficient documentation

## 2015-10-05 HISTORY — DX: Cardiac murmur, unspecified: R01.1

## 2015-10-05 HISTORY — DX: Unspecified osteoarthritis, unspecified site: M19.90

## 2015-10-05 HISTORY — DX: Gastro-esophageal reflux disease without esophagitis: K21.9

## 2015-10-05 LAB — TYPE AND SCREEN
ABO/RH(D): A POS
Antibody Screen: NEGATIVE

## 2015-10-05 LAB — COMPREHENSIVE METABOLIC PANEL
ALT: 27 U/L (ref 17–63)
AST: 27 U/L (ref 15–41)
Albumin: 4.2 g/dL (ref 3.5–5.0)
Alkaline Phosphatase: 51 U/L (ref 38–126)
Anion gap: 9 (ref 5–15)
BUN: 36 mg/dL — ABNORMAL HIGH (ref 6–20)
CO2: 26 mmol/L (ref 22–32)
Calcium: 9.5 mg/dL (ref 8.9–10.3)
Chloride: 105 mmol/L (ref 101–111)
Creatinine, Ser: 1.02 mg/dL (ref 0.61–1.24)
GFR calc Af Amer: >60 mL/min (ref >60)
GFR calc non Af Amer: >60 mL/min (ref >60)
Glucose, Bld: 115 mg/dL — ABNORMAL HIGH (ref 65–99)
Potassium: 3.7 mmol/L (ref 3.5–5.1)
Sodium: 140 mmol/L (ref 135–145)
Total Bilirubin: 1 mg/dL (ref 0.3–1.2)
Total Protein: 7.1 g/dL (ref 6.5–8.1)

## 2015-10-05 LAB — URINALYSIS COMPLETE WITH MICROSCOPIC (ARMC ONLY)
Bacteria, UA: NONE SEEN
Bilirubin Urine: NEGATIVE
Glucose, UA: NEGATIVE mg/dL
Hgb urine dipstick: NEGATIVE
Ketones, ur: NEGATIVE mg/dL
Leukocytes, UA: NEGATIVE
Nitrite: NEGATIVE
Protein, ur: 100 mg/dL — AB
Specific Gravity, Urine: 1.02 (ref 1.005–1.030)
pH: 5 (ref 5.0–8.0)

## 2015-10-05 LAB — CBC
HCT: 43.4 % (ref 40.0–52.0)
Hemoglobin: 15.1 g/dL (ref 13.0–18.0)
MCH: 31.6 pg (ref 26.0–34.0)
MCHC: 34.8 g/dL (ref 32.0–36.0)
MCV: 90.7 fL (ref 80.0–100.0)
Platelets: 175 10*3/uL (ref 150–440)
RBC: 4.78 MIL/uL (ref 4.40–5.90)
RDW: 13.5 % (ref 11.5–14.5)
WBC: 9.1 10*3/uL (ref 3.8–10.6)

## 2015-10-05 LAB — SEDIMENTATION RATE: Sed Rate: 8 mm/hr (ref 0–20)

## 2015-10-05 LAB — APTT: aPTT: 32 seconds (ref 24–36)

## 2015-10-05 LAB — SURGICAL PCR SCREEN
MRSA, PCR: NEGATIVE
Staphylococcus aureus: POSITIVE — AB

## 2015-10-05 LAB — PROTIME-INR
INR: 1.52
Prothrombin Time: 18.4 seconds — ABNORMAL HIGH (ref 11.4–15.0)

## 2015-10-05 NOTE — Patient Instructions (Signed)
  Your procedure is scheduled on October 12, 2015 (Wednesday) Report to Day Surgery.[facility] SECOND FLOOR To find out your arrival time please call [phone] between 1PM - 3PM on October 11, 2015 (Tuesday).  Remember: Instructions that are not followed completely may result in serious medical risk, up to and including death, or upon the discretion of your surgeon and anesthesiologist your surgery may need to be rescheduled.    _x___ 1. Do not eat food or drink liquids after midnight. No gum chewing or hard candies.     _x___ 2. No Alcohol for 24 hours before or after surgery.   _x___ 3. Do Not Smoke For 24 Hours Prior to Your Surgery.   ____ 4. Bring all medications with you on the day of surgery if instructed.    __x__ 5. Notify your doctor if there is any change in your medical condition     (cold, fever, infections).       Do not wear jewelry, make-up, hairpins, clips or nail polish.  Do not wear lotions, powders, or perfumes. You may wear deodorant.  Do not shave 48 hours prior to surgery. Men may shave face and neck.  Do not bring valuables to the hospital.    [facility] is not responsible for any belongings or valuables.               Contacts, dentures or bridgework may not be worn into surgery.  Leave your suitcase in the car. After surgery it may be brought to your room.  For patients admitted to the hospital, discharge time is determined by your                treatment team.   Patients discharged the day of surgery will not be allowed to drive home.   Please read over the following fact sheets that you were given:   MRSA Information and Surgical Site Infection Prevention   _x___ Take these medicines the morning of surgery with A SIP OF WATER:    1. AMLODIPINE  2.   3.   4.  5.  6.  ____ Fleet Enema (as directed)   _x___ Use CHG Soap as directed  ____ Use inhalers on the day of surgery  ____ Stop metformin 2 days prior to surgery    ____ Take 1/2 of  usual insulin dose the night before surgery and none on the morning of surgery.   _x___ Stop Coumadin/Plavix/aspirin on (PATIENT INSTRUCTED BY [place] ON JUNE  26)  __x__ Stop Anti-inflammatories on (NO NSAIDS) TYLENOL OK TO TAKE FOR PAIN IF NEEDED   _x___ Stop supplements until after surgery.  (STOP PRESERVISION NOW )  ____ Bring C-Pap to the hospital.

## 2015-10-05 NOTE — Pre-Procedure Instructions (Signed)
Staph results faxed and called to Dr. Marry Guan office (spoke to Nadine Counts).

## 2015-10-06 LAB — URINE CULTURE: Special Requests: NORMAL

## 2015-10-12 ENCOUNTER — Encounter
Admission: RE | Disposition: A | Discharge status: Skilled Nursing Facility | Payer: Self-pay | Source: Ambulatory Visit | Attending: Orthopedic Surgery

## 2015-10-12 ENCOUNTER — Inpatient Hospital Stay: Payer: Medicare Other | Admitting: Anesthesiology

## 2015-10-12 ENCOUNTER — Inpatient Hospital Stay: Payer: Medicare Other

## 2015-10-12 ENCOUNTER — Encounter: Payer: Self-pay | Admitting: *Deleted

## 2015-10-12 ENCOUNTER — Inpatient Hospital Stay
Admission: RE | Admit: 2015-10-12 | Discharge: 2015-10-16 | DRG: 470 | Disposition: A | Discharge status: Skilled Nursing Facility | Payer: Medicare Other | Source: Ambulatory Visit | Attending: Orthopedic Surgery | Admitting: Orthopedic Surgery

## 2015-10-12 DIAGNOSIS — Z800 Family history of malignant neoplasm of digestive organs: Secondary | ICD-10-CM | POA: Diagnosis not present

## 2015-10-12 DIAGNOSIS — K219 Gastro-esophageal reflux disease without esophagitis: Secondary | ICD-10-CM | POA: Diagnosis present

## 2015-10-12 DIAGNOSIS — J449 Chronic obstructive pulmonary disease, unspecified: Secondary | ICD-10-CM | POA: Diagnosis present

## 2015-10-12 DIAGNOSIS — Z23 Encounter for immunization: Secondary | ICD-10-CM

## 2015-10-12 DIAGNOSIS — Z880 Allergy status to penicillin: Secondary | ICD-10-CM | POA: Diagnosis not present

## 2015-10-12 DIAGNOSIS — Z79899 Other long term (current) drug therapy: Secondary | ICD-10-CM

## 2015-10-12 DIAGNOSIS — Z833 Family history of diabetes mellitus: Secondary | ICD-10-CM | POA: Diagnosis not present

## 2015-10-12 DIAGNOSIS — Z6831 Body mass index (BMI) 31.0-31.9, adult: Secondary | ICD-10-CM

## 2015-10-12 DIAGNOSIS — I10 Essential (primary) hypertension: Secondary | ICD-10-CM | POA: Diagnosis present

## 2015-10-12 DIAGNOSIS — Z7901 Long term (current) use of anticoagulants: Secondary | ICD-10-CM

## 2015-10-12 DIAGNOSIS — I739 Peripheral vascular disease, unspecified: Secondary | ICD-10-CM | POA: Diagnosis present

## 2015-10-12 DIAGNOSIS — Z8249 Family history of ischemic heart disease and other diseases of the circulatory system: Secondary | ICD-10-CM

## 2015-10-12 DIAGNOSIS — G4730 Sleep apnea, unspecified: Secondary | ICD-10-CM | POA: Diagnosis present

## 2015-10-12 DIAGNOSIS — E669 Obesity, unspecified: Secondary | ICD-10-CM | POA: Diagnosis present

## 2015-10-12 DIAGNOSIS — I4891 Unspecified atrial fibrillation: Secondary | ICD-10-CM | POA: Diagnosis present

## 2015-10-12 DIAGNOSIS — M1611 Unilateral primary osteoarthritis, right hip: Secondary | ICD-10-CM | POA: Diagnosis present

## 2015-10-12 DIAGNOSIS — Z96649 Presence of unspecified artificial hip joint: Secondary | ICD-10-CM

## 2015-10-12 HISTORY — PX: TOTAL HIP ARTHROPLASTY: SHX124

## 2015-10-12 LAB — PROTIME-INR
INR: 1.01
Prothrombin Time: 13.5 seconds (ref 11.4–15.0)

## 2015-10-12 SURGERY — ARTHROPLASTY, HIP, TOTAL,POSTERIOR APPROACH
Anesthesia: Spinal | Site: Hip | Laterality: Right | Wound class: Clean

## 2015-10-12 MED ORDER — CLINDAMYCIN PHOSPHATE 900 MG/50ML IV SOLN
INTRAVENOUS | Status: AC
  Filled 2015-10-12: qty 50

## 2015-10-12 MED ORDER — PHENOL 1.4 % MT LIQD
1.0000 | OROMUCOSAL | Status: DC | PRN
  Filled 2015-10-12: qty 177

## 2015-10-12 MED ORDER — ESMOLOL HCL 100 MG/10ML IV SOLN
INTRAVENOUS | Status: DC | PRN
  Administered 2015-10-12 (×2): 10 mg via INTRAVENOUS

## 2015-10-12 MED ORDER — ONDANSETRON HCL 4 MG PO TABS: 4.0000 mg | ORAL_TABLET | Freq: Four times a day (QID) | ORAL | Status: DC | PRN

## 2015-10-12 MED ORDER — CLINDAMYCIN PHOSPHATE 600 MG/50ML IV SOLN
600.0000 mg | Freq: Four times a day (QID) | INTRAVENOUS | Status: AC
  Administered 2015-10-12 – 2015-10-13 (×4): 600 mg via INTRAVENOUS
  Filled 2015-10-12 (×4): qty 50

## 2015-10-12 MED ORDER — DEXAMETHASONE SODIUM PHOSPHATE 10 MG/ML IJ SOLN
INTRAMUSCULAR | Status: DC | PRN
  Administered 2015-10-12: 10 mg via INTRAVENOUS

## 2015-10-12 MED ORDER — TAMSULOSIN HCL 0.4 MG PO CAPS
0.4000 mg | ORAL_CAPSULE | Freq: Every day | ORAL | Status: DC
  Administered 2015-10-12 – 2015-10-16 (×5): 0.4 mg via ORAL
  Filled 2015-10-12 (×5): qty 1

## 2015-10-12 MED ORDER — TRANEXAMIC ACID 1000 MG/10ML IV SOLN
1000.0000 mg | Freq: Once | INTRAVENOUS | Status: AC
  Administered 2015-10-12: 1000 mg via INTRAVENOUS
  Filled 2015-10-12: qty 10

## 2015-10-12 MED ORDER — BISACODYL 10 MG RE SUPP
10.0000 mg | Freq: Every day | RECTAL | Status: DC | PRN
  Administered 2015-10-15: 10 mg via RECTAL
  Filled 2015-10-12: qty 1

## 2015-10-12 MED ORDER — ACETAMINOPHEN 325 MG PO TABS: 650.0000 mg | ORAL_TABLET | Freq: Four times a day (QID) | ORAL | Status: DC | PRN

## 2015-10-12 MED ORDER — PROPOFOL 10 MG/ML IV BOLUS
INTRAVENOUS | Status: DC | PRN
  Administered 2015-10-12 (×2): 10 mg via INTRAVENOUS

## 2015-10-12 MED ORDER — OXYCODONE HCL 5 MG PO TABS
5.0000 mg | ORAL_TABLET | ORAL | Status: DC | PRN
  Administered 2015-10-12: 10 mg via ORAL
  Administered 2015-10-12 – 2015-10-13 (×3): 5 mg via ORAL
  Administered 2015-10-13 – 2015-10-14 (×3): 10 mg via ORAL
  Filled 2015-10-12 (×2): qty 2
  Filled 2015-10-12 (×2): qty 1
  Filled 2015-10-12: qty 2
  Filled 2015-10-12: qty 1
  Filled 2015-10-12: qty 2

## 2015-10-12 MED ORDER — ONDANSETRON HCL 4 MG/2ML IJ SOLN
4.0000 mg | Freq: Four times a day (QID) | INTRAMUSCULAR | Status: DC | PRN
  Administered 2015-10-15: 4 mg via INTRAVENOUS
  Filled 2015-10-12: qty 2

## 2015-10-12 MED ORDER — GLYCOPYRROLATE 0.2 MG/ML IJ SOLN
INTRAMUSCULAR | Status: DC | PRN
  Administered 2015-10-12: 0.1 mg via INTRAVENOUS
  Administered 2015-10-12: 0.2 mg via INTRAVENOUS

## 2015-10-12 MED ORDER — FAMOTIDINE 20 MG PO TABS
20.0000 mg | ORAL_TABLET | Freq: Once | ORAL | Status: AC
  Administered 2015-10-12: 20 mg via ORAL

## 2015-10-12 MED ORDER — CHLORHEXIDINE GLUCONATE 4 % EX LIQD: 60.0000 mL | Freq: Once | CUTANEOUS | Status: DC

## 2015-10-12 MED ORDER — ONDANSETRON HCL 4 MG/2ML IJ SOLN: 4.0000 mg | Freq: Once | INTRAMUSCULAR | Status: DC | PRN

## 2015-10-12 MED ORDER — AMLODIPINE BESYLATE 10 MG PO TABS
10.0000 mg | ORAL_TABLET | Freq: Every day | ORAL | Status: DC
  Administered 2015-10-12 – 2015-10-16 (×5): 10 mg via ORAL
  Filled 2015-10-12 (×5): qty 1

## 2015-10-12 MED ORDER — OCUVITE-LUTEIN PO CAPS
1.0000 | ORAL_CAPSULE | Freq: Every day | ORAL | Status: DC
  Administered 2015-10-12 – 2015-10-16 (×5): 1 via ORAL
  Filled 2015-10-12 (×5): qty 1

## 2015-10-12 MED ORDER — RIVAROXABAN 20 MG PO TABS
20.0000 mg | ORAL_TABLET | Freq: Every day | ORAL | Status: DC
  Administered 2015-10-13 – 2015-10-15 (×3): 20 mg via ORAL
  Filled 2015-10-12 (×3): qty 1

## 2015-10-12 MED ORDER — ACETAMINOPHEN 10 MG/ML IV SOLN
INTRAVENOUS | Status: DC | PRN
Start: 2015-10-12 — End: 2015-10-12
  Administered 2015-10-12: 1000 mg via INTRAVENOUS

## 2015-10-12 MED ORDER — NEOMYCIN-POLYMYXIN B GU 40-200000 IR SOLN
Status: AC
  Filled 2015-10-12: qty 20

## 2015-10-12 MED ORDER — LACTATED RINGERS IV SOLN
INTRAVENOUS | Status: DC
  Administered 2015-10-12: 07:00:00 via INTRAVENOUS

## 2015-10-12 MED ORDER — NEOMYCIN-POLYMYXIN B GU 40-200000 IR SOLN
Status: DC | PRN
  Administered 2015-10-12: 16 mL

## 2015-10-12 MED ORDER — MAGNESIUM HYDROXIDE 400 MG/5ML PO SUSP
30.0000 mL | Freq: Every day | ORAL | Status: DC | PRN
  Administered 2015-10-13: 30 mL via ORAL
  Filled 2015-10-12: qty 30

## 2015-10-12 MED ORDER — SODIUM CHLORIDE 0.9 % IV SOLN
INTRAVENOUS | Status: DC
  Administered 2015-10-12 (×2): via INTRAVENOUS

## 2015-10-12 MED ORDER — ACETAMINOPHEN 10 MG/ML IV SOLN
INTRAVENOUS | Status: AC
  Filled 2015-10-12: qty 100

## 2015-10-12 MED ORDER — CHLORTHALIDONE 25 MG PO TABS
25.0000 mg | ORAL_TABLET | Freq: Every day | ORAL | Status: DC | PRN
  Filled 2015-10-12: qty 1

## 2015-10-12 MED ORDER — MENTHOL 3 MG MT LOZG
1.0000 | LOZENGE | OROMUCOSAL | Status: DC | PRN
  Filled 2015-10-12: qty 9

## 2015-10-12 MED ORDER — FENTANYL CITRATE (PF) 100 MCG/2ML IJ SOLN: 25.0000 ug | INTRAMUSCULAR | Status: DC | PRN

## 2015-10-12 MED ORDER — ALUM & MAG HYDROXIDE-SIMETH 200-200-20 MG/5ML PO SUSP
30.0000 mL | ORAL | Status: DC | PRN
  Administered 2015-10-12 – 2015-10-16 (×6): 30 mL via ORAL
  Filled 2015-10-12 (×6): qty 30

## 2015-10-12 MED ORDER — PROPOFOL 500 MG/50ML IV EMUL
INTRAVENOUS | Status: DC | PRN
  Administered 2015-10-12: 75 ug/kg/min via INTRAVENOUS

## 2015-10-12 MED ORDER — PNEUMOCOCCAL VAC POLYVALENT 25 MCG/0.5ML IJ INJ: 0.5000 mL | INJECTION | INTRAMUSCULAR | Status: DC

## 2015-10-12 MED ORDER — FLEET ENEMA 7-19 GM/118ML RE ENEM: 1.0000 | ENEMA | Freq: Once | RECTAL | Status: DC | PRN

## 2015-10-12 MED ORDER — BUPIVACAINE HCL (PF) 0.5 % IJ SOLN
INTRAMUSCULAR | Status: DC | PRN
  Administered 2015-10-12: 3 mL

## 2015-10-12 MED ORDER — TRAMADOL HCL 50 MG PO TABS
50.0000 mg | ORAL_TABLET | ORAL | Status: DC | PRN
  Administered 2015-10-12: 50 mg via ORAL
  Administered 2015-10-12: 100 mg via ORAL
  Administered 2015-10-12 – 2015-10-16 (×6): 50 mg via ORAL
  Filled 2015-10-12 (×4): qty 1
  Filled 2015-10-12: qty 2
  Filled 2015-10-12 (×3): qty 1

## 2015-10-12 MED ORDER — ACETAMINOPHEN 650 MG RE SUPP: 650.0000 mg | Freq: Four times a day (QID) | RECTAL | Status: DC | PRN

## 2015-10-12 MED ORDER — ACETAMINOPHEN 10 MG/ML IV SOLN
1000.0000 mg | Freq: Four times a day (QID) | INTRAVENOUS | Status: AC
  Administered 2015-10-12 – 2015-10-13 (×4): 1000 mg via INTRAVENOUS
  Filled 2015-10-12 (×4): qty 100

## 2015-10-12 MED ORDER — FENTANYL CITRATE (PF) 100 MCG/2ML IJ SOLN
INTRAMUSCULAR | Status: DC | PRN
  Administered 2015-10-12 (×2): 25 ug via INTRAVENOUS

## 2015-10-12 MED ORDER — SODIUM CHLORIDE 0.9 % IV SOLN
10000.0000 ug | INTRAVENOUS | Status: DC | PRN
  Administered 2015-10-12: 60 ug/min via INTRAVENOUS

## 2015-10-12 MED ORDER — TRANEXAMIC ACID 1000 MG/10ML IV SOLN
1000.0000 mg | INTRAVENOUS | Status: AC
  Administered 2015-10-12: 1000 mg via INTRAVENOUS
  Filled 2015-10-12: qty 10

## 2015-10-12 MED ORDER — ONDANSETRON HCL 4 MG/2ML IJ SOLN
INTRAMUSCULAR | Status: DC | PRN
  Administered 2015-10-12: 4 mg via INTRAVENOUS

## 2015-10-12 MED ORDER — FERROUS SULFATE 325 (65 FE) MG PO TABS
325.0000 mg | ORAL_TABLET | Freq: Two times a day (BID) | ORAL | Status: DC
  Administered 2015-10-12 – 2015-10-16 (×8): 325 mg via ORAL
  Filled 2015-10-12 (×8): qty 1

## 2015-10-12 MED ORDER — DIPHENHYDRAMINE HCL 12.5 MG/5ML PO ELIX: 12.5000 mg | ORAL_SOLUTION | ORAL | Status: DC | PRN

## 2015-10-12 MED ORDER — MORPHINE SULFATE (PF) 2 MG/ML IV SOLN
2.0000 mg | INTRAVENOUS | Status: DC | PRN
Start: 2015-10-12 — End: 2015-10-16

## 2015-10-12 MED ORDER — PHENYLEPHRINE HCL 10 MG/ML IJ SOLN
INTRAMUSCULAR | Status: DC | PRN
  Administered 2015-10-12: 100 ug via INTRAVENOUS

## 2015-10-12 MED ORDER — SENNOSIDES-DOCUSATE SODIUM 8.6-50 MG PO TABS
1.0000 | ORAL_TABLET | Freq: Two times a day (BID) | ORAL | Status: DC
Start: 2015-10-12 — End: 2015-10-16
  Administered 2015-10-12 – 2015-10-16 (×9): 1 via ORAL
  Filled 2015-10-12 (×9): qty 1

## 2015-10-12 MED ORDER — CLINDAMYCIN PHOSPHATE 900 MG/50ML IV SOLN
900.0000 mg | INTRAVENOUS | Status: AC
  Administered 2015-10-12: 900 mg via INTRAVENOUS

## 2015-10-12 MED ORDER — KETAMINE HCL 50 MG/ML IJ SOLN
INTRAMUSCULAR | Status: DC | PRN
  Administered 2015-10-12: 20 mg via INTRAMUSCULAR

## 2015-10-12 MED ORDER — METOCLOPRAMIDE HCL 10 MG PO TABS
10.0000 mg | ORAL_TABLET | Freq: Three times a day (TID) | ORAL | Status: AC
  Administered 2015-10-12 – 2015-10-14 (×7): 10 mg via ORAL
  Filled 2015-10-12 (×7): qty 1

## 2015-10-12 MED ORDER — EPHEDRINE SULFATE 50 MG/ML IJ SOLN
INTRAMUSCULAR | Status: DC | PRN
  Administered 2015-10-12 (×2): 10 mg via INTRAVENOUS

## 2015-10-12 MED ORDER — FAMOTIDINE 20 MG PO TABS
ORAL_TABLET | ORAL | Status: AC
  Administered 2015-10-12: 20 mg via ORAL
  Filled 2015-10-12: qty 1

## 2015-10-12 SURGICAL SUPPLY — 50 items
BLADE DRUM FLTD (BLADE) ×3 IMPLANT
BLADE SAW 1 (BLADE) ×3 IMPLANT
CANISTER SUCT 1200ML W/VALVE (MISCELLANEOUS) ×3 IMPLANT
CANISTER SUCT 3000ML (MISCELLANEOUS) ×6 IMPLANT
CAPT HIP TOTAL 2 ×3 IMPLANT
CARTRIDGE OIL MAESTRO DRILL (MISCELLANEOUS) ×1 IMPLANT
CATH FOL LEG HOLDER (MISCELLANEOUS) ×3 IMPLANT
CATH TRAY METER 16FR LF (MISCELLANEOUS) ×3 IMPLANT
DIFFUSER MAESTRO (MISCELLANEOUS) ×3 IMPLANT
DRAPE INCISE IOBAN 66X60 STRL (DRAPES) ×3 IMPLANT
DRAPE SHEET LG 3/4 BI-LAMINATE (DRAPES) ×3 IMPLANT
DRSG DERMACEA 8X12 NADH (GAUZE/BANDAGES/DRESSINGS) ×3 IMPLANT
DRSG OPSITE POSTOP 3X4 (GAUZE/BANDAGES/DRESSINGS) ×3 IMPLANT
DRSG OPSITE POSTOP 4X12 (GAUZE/BANDAGES/DRESSINGS) ×3 IMPLANT
DRSG OPSITE POSTOP 4X14 (GAUZE/BANDAGES/DRESSINGS) ×3 IMPLANT
DRSG TEGADERM 4X4.75 (GAUZE/BANDAGES/DRESSINGS) ×3 IMPLANT
DURAPREP 26ML APPLICATOR (WOUND CARE) ×3 IMPLANT
ELECT BLADE 6.5 EXT (BLADE) ×3 IMPLANT
ELECT CAUTERY BLADE 6.4 (BLADE) ×3 IMPLANT
GLOVE BIOGEL M STRL SZ7.5 (GLOVE) ×6 IMPLANT
GLOVE INDICATOR 8.0 STRL GRN (GLOVE) ×3 IMPLANT
GLOVE SURG 9.0 ORTHO LTXF (GLOVE) ×3 IMPLANT
GLOVE SURG ORTHO 9.0 STRL STRW (GLOVE) ×3 IMPLANT
GOWN STRL REUS W/ TWL LRG LVL3 (GOWN DISPOSABLE) ×2 IMPLANT
GOWN STRL REUS W/TWL 2XL LVL3 (GOWN DISPOSABLE) ×3 IMPLANT
GOWN STRL REUS W/TWL LRG LVL3 (GOWN DISPOSABLE) ×4
HANDPIECE SUCTION TUBG SURGILV (MISCELLANEOUS) ×3 IMPLANT
HEMOVAC 400CC 10FR (MISCELLANEOUS) ×3 IMPLANT
HOOD PEEL AWAY FLYTE STAYCOOL (MISCELLANEOUS) ×6 IMPLANT
KIT RM TURNOVER STRD PROC AR (KITS) ×3 IMPLANT
NDL SAFETY 18GX1.5 (NEEDLE) ×3 IMPLANT
NS IRRIG 500ML POUR BTL (IV SOLUTION) ×3 IMPLANT
OIL CARTRIDGE MAESTRO DRILL (MISCELLANEOUS) ×3
PACK HIP PROSTHESIS (MISCELLANEOUS) ×3 IMPLANT
PIN STEIN THRED 5/32 (Pin) ×3 IMPLANT
SOL .9 NS 3000ML IRR  AL (IV SOLUTION) ×2
SOL .9 NS 3000ML IRR UROMATIC (IV SOLUTION) ×1 IMPLANT
SOL PREP PVP 2OZ (MISCELLANEOUS) ×3
SOLUTION PREP PVP 2OZ (MISCELLANEOUS) ×1 IMPLANT
SPONGE DRAIN TRACH 4X4 STRL 2S (GAUZE/BANDAGES/DRESSINGS) ×3 IMPLANT
STAPLER SKIN PROX 35W (STAPLE) ×3 IMPLANT
SUT ETHIBOND #5 BRAIDED 30INL (SUTURE) ×3 IMPLANT
SUT VIC AB 0 CT1 36 (SUTURE) ×3 IMPLANT
SUT VIC AB 1 CT1 36 (SUTURE) ×6 IMPLANT
SUT VIC AB 2-0 CT1 27 (SUTURE) ×2
SUT VIC AB 2-0 CT1 TAPERPNT 27 (SUTURE) ×1 IMPLANT
SYR 20CC LL (SYRINGE) ×3 IMPLANT
TAPE ADH 3 LX (MISCELLANEOUS) ×3 IMPLANT
TAPE TRANSPORE STRL 2 31045 (GAUZE/BANDAGES/DRESSINGS) ×3 IMPLANT
TOWEL OR 17X26 4PK STRL BLUE (TOWEL DISPOSABLE) ×3 IMPLANT

## 2015-10-12 NOTE — Anesthesia Preprocedure Evaluation (Addendum)
Anesthesia Evaluation  Patient identified by MRN, date of birth, ID band Patient awake    Reviewed: Allergy & Precautions, NPO status , Patient's Chart, lab work & pertinent test results, reviewed documented beta blocker date and time   Airway Mallampati: II  TM Distance: >3 FB     Dental  (+) Chipped   Pulmonary sleep apnea , pneumonia, resolved, COPD, former smoker,           Cardiovascular hypertension, Pt. on medications + Peripheral Vascular Disease  + dysrhythmias Atrial Fibrillation      Neuro/Psych    GI/Hepatic GERD  Controlled,  Endo/Other    Renal/GU      Musculoskeletal  (+) Arthritis ,   Abdominal   Peds  Hematology   Anesthesia Other Findings Obese. Stopped Xarelto 3 days. No CPAP use. EKG no change, RBBB and AF. PT now 1.01.  Reproductive/Obstetrics                            Anesthesia Physical Anesthesia Plan  ASA: III  Anesthesia Plan: Spinal   Post-op Pain Management:    Induction:   Airway Management Planned:   Additional Equipment:   Intra-op Plan:   Post-operative Plan:   Informed Consent: I have reviewed the patients History and Physical, chart, labs and discussed the procedure including the risks, benefits and alternatives for the proposed anesthesia with the patient or authorized representative who has indicated his/her understanding and acceptance.     Plan Discussed with: CRNA  Anesthesia Plan Comments:         Anesthesia Quick Evaluation

## 2015-10-12 NOTE — NC FL2 (Signed)
[place] LEVEL OF CARE SCREENING TOOL     IDENTIFICATION  Patient Name: Dennis Savage Birthdate: 1929/07/21 Sex: male Admission Date (Current Location): 10/12/2015  [place] and [place] Number:  Engineering geologist and Address:  [facility], [address], [place], [place] [address]      Provider Number: [id number]  Attending Physician Name and Address:  Dereck Leep, MD  Relative Name and Phone Number:       Current Level of Care: Hospital Recommended Level of Care: [place] Prior Approval Number:    Date Approved/Denied:   PASRR Number:    Discharge Plan: SNF    Current Diagnoses: Patient Active Problem List   Diagnosis Date Noted  . 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 Degenerative arthritis of hip 09/12/2015  . Atrial fibrillation ([place]) 07/13/2015  . Apnea, sleep 07/13/2015  . Acute diarrhea 02/25/2015  . Dizziness 02/25/2015  . 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 Lymphangioma, 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  . Retinal hemorrhage 03/16/2014  . Interstitial lung disease ([place]) 10/13/2013  . After cataract not obscuring vision 12/21/2011  . History of surgical procedure 12/21/2011  . CN (constipation) 06/15/2011  . Encounter for general adult medical examination without abnormal findings 06/15/2011  . Arthralgia of multiple joints 06/15/2011  . 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 Artificial lens present 12/08/2010  . 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 Benign essential HTN 11/03/2010  . Chronic obstructive pulmonary disease ([place]) 11/03/2010  . Benign hypertension 11/03/2010  . LBP (low back pain) 11/03/2010  . Peripheral vascular disease ([place]) 11/03/2010  . Current tobacco use 11/03/2010    Orientation RESPIRATION BLADDER Height & Weight     Self, Time, Situation, Place  O2 (3 Liters Oxygen ) Continent Weight: 212 lb (96.163 kg) Height:  5\' 9"  (175.3 cm)  BEHAVIORAL SYMPTOMS/MOOD NEUROLOGICAL BOWEL  NUTRITION STATUS   (none )  (none ) Continent Diet (Regular Diet )  AMBULATORY STATUS COMMUNICATION OF NEEDS Skin   Extensive Assist Verbally Surgical wounds (Incision: Right Hip. )                       Personal Care Assistance Level of Assistance  Bathing, Feeding, Dressing Bathing Assistance: Limited assistance Feeding assistance: Independent Dressing Assistance: Limited assistance     Functional Limitations Info  Sight, Hearing, Speech Sight Info: Impaired Hearing Info: Impaired Speech Info: Adequate    SPECIAL CARE FACTORS FREQUENCY  PT (By licensed PT), OT (By licensed OT)     PT Frequency:  (5) OT Frequency:  (5)            Contractures      Additional Factors Info  Code Status, Allergies Code Status Info:  (Full Code. ) Allergies Info:  (Penicillins)           Current Medications (10/12/2015):  This is the current hospital active medication list Current Facility-Administered Medications  Medication Dose Route Frequency Provider Last Rate Last Dose  . 0.9 %  sodium chloride infusion   Intravenous Continuous Dereck Leep, MD 100 mL/hr at 10/12/15 1220    . acetaminophen (OFIRMEV) IV 1,000 mg  1,000 mg Intravenous Q6H Dereck Leep, MD   1,000 mg at 10/12/15 1221  . acetaminophen (TYLENOL) tablet 650 mg  650 mg Oral Q6H PRN Dereck Leep, MD       Or  . acetaminophen (TYLENOL) suppository 650 mg  650 mg Rectal Q6H PRN Dereck Leep,  MD      . alum & mag hydroxide-simeth (MAALOX/MYLANTA) 200-200-20 MG/5ML suspension 30 mL  30 mL Oral Q4H PRN Dereck Leep, MD      . amLODipine (NORVASC) tablet 10 mg  10 mg Oral Daily Dereck Leep, MD   10 mg at 10/12/15 1219  . bisacodyl (DULCOLAX) suppository 10 mg  10 mg Rectal Daily PRN Dereck Leep, MD      . chlorthalidone (HYGROTON) tablet 25 mg  25 mg Oral Daily PRN Dereck Leep, MD      . clindamycin (CLEOCIN) 900 MG/50ML IVPB           . clindamycin (CLEOCIN) IVPB 600 mg  600 mg Intravenous Q6H Dereck Leep, MD   600 mg at 10/12/15 1257  . diphenhydrAMINE (BENADRYL) 12.5 MG/5ML elixir 12.5-25 mg  12.5-25 mg Oral Q4H PRN Dereck Leep, MD      . ferrous sulfate tablet 325 mg  325 mg Oral BID WC Dereck Leep, MD   325 mg at 10/12/15 1608  . magnesium hydroxide (MILK OF MAGNESIA) suspension 30 mL  30 mL Oral Daily PRN Dereck Leep, MD      . menthol-cetylpyridinium (CEPACOL) lozenge 3 mg  1 lozenge Oral PRN Dereck Leep, MD       Or  . phenol (CHLORASEPTIC) mouth spray 1 spray  1 spray Mouth/Throat PRN Dereck Leep, MD      . metoCLOPramide (REGLAN) tablet 10 mg  10 mg Oral TID AC & HS Dereck Leep, MD   10 mg at 10/12/15 1608  . morphine 2 MG/ML injection 2 mg  2 mg Intravenous Q2H PRN Dereck Leep, MD      . multivitamin-lutein (OCUVITE-LUTEIN) capsule 1 capsule  1 capsule Oral Daily Dereck Leep, MD   1 capsule at 10/12/15 1224  . ondansetron (ZOFRAN) tablet 4 mg  4 mg Oral Q6H PRN Dereck Leep, MD       Or  . ondansetron (ZOFRAN) injection 4 mg  4 mg Intravenous Q6H PRN Dereck Leep, MD      . oxyCODONE (Oxy IR/ROXICODONE) immediate release tablet 5-10 mg  5-10 mg Oral Q4H PRN Dereck Leep, MD   5 mg at 10/12/15 1308  . [START ON 10/13/2015] pneumococcal 23 valent vaccine (PNU-IMMUNE) injection 0.5 mL  0.5 mL Intramuscular Tomorrow-1000 Dereck Leep, MD      . Derrill Memo ON 10/13/2015] rivaroxaban (XARELTO) tablet 20 mg  20 mg Oral Q supper Dereck Leep, MD      . senna-docusate (Senokot-S) tablet 1 tablet  1 tablet Oral BID Dereck Leep, MD   1 tablet at 10/12/15 1219  . sodium phosphate (FLEET) 7-19 GM/118ML enema 1 enema  1 enema Rectal Once PRN Dereck Leep, MD      . tamsulosin (FLOMAX) capsule 0.4 mg  0.4 mg Oral Daily Dereck Leep, MD   0.4 mg at 10/12/15 1219  . traMADol (ULTRAM) tablet 50-100 mg  50-100 mg Oral Q4H PRN Dereck Leep, MD   50 mg at 10/12/15 1608     Discharge Medications: Please see discharge summary for a list of discharge  medications.  Relevant Imaging Results:  Relevant Lab Results:   Additional Information  (SSN: [id number])  Loralyn Freshwater, LCSW

## 2015-10-12 NOTE — Anesthesia Procedure Notes (Signed)
Spinal Patient location during procedure: OR Start time: 10/12/2015 7:21 AM End time: 10/12/2015 7:35 AM Staffing Performed by: anesthesiologist  Preanesthetic Checklist Completed: patient identified, site marked, surgical consent, pre-op evaluation, timeout performed, IV checked, risks and benefits discussed and monitors and equipment checked Spinal Block Patient position: sitting Prep: ChloraPrep Patient monitoring: continuous pulse ox and blood pressure Approach: midline Location: L2-3 Injection technique: single-shot Needle Needle type: [place]  Needle gauge: 25 G

## 2015-10-12 NOTE — Evaluation (Signed)
Physical Therapy Evaluation Patient Details Name: Dennis Savage MRN: [id number] DOB: 1929-05-07 Today's Date: 10/12/2015   History of Present Illness  Patient is a pleasant 82 y/o male that presents for R THR.   Clinical Impression  Patient seen for R THR, he has previously been mod I to independent with household mobility. He reports some concerns of returning home after this admission as he lives with daughters who work during the day and his mobile home cannot accommodate a RW. He also has high threshold to step into tub. Patient today requires fairly significant assistance with bed mobility and sit to stand transfer, as well as mod verbal cuing. He improves with gait, however unable to tolerate more than 3' today due to pain/weakness. He is progressing well, however would recommend STR upon discharge due to concerns of living space and being at home alone.      Follow Up Recommendations SNF    Equipment Recommendations  Rolling walker with 5" wheels    Recommendations for Other Services       Precautions / Restrictions Precautions Precautions: Posterior Hip;Fall Restrictions Weight Bearing Restrictions: Yes RLE Weight Bearing: Weight bearing as tolerated      Mobility  Bed Mobility Overal bed mobility: Needs Assistance Bed Mobility: Supine to Sit     Supine to sit: Min assist;Mod assist     General bed mobility comments: Patient requires assistance to bring LEs off the EOB and manage trunk secondary to weakness.   Transfers Overall transfer level: Needs assistance Equipment used: Rolling walker (2 wheeled) Transfers: Sit to/from Stand Sit to Stand: Mod assist;Max assist         General transfer comment: Patient requires fairly heavy physical assistance and cuing to complete transfer, no increase in dizziness during.   Ambulation/Gait Ambulation/Gait assistance: Min guard;Min assist Ambulation Distance (Feet): 3 Feet Assistive device: Rolling walker (2  wheeled) Gait Pattern/deviations: Step-through pattern;Decreased step length - right;Decreased step length - left;Narrow base of support;Trunk flexed   Gait velocity interpretation: Below normal speed for age/gender General Gait Details: Patient is able to advance LEs forward in sequence provided by therapist. No buckling or loss of balance, he did note minor increase in pain.   Stairs            Wheelchair Mobility    Modified Rankin (Stroke Patients Only)       Balance Overall balance assessment: Needs assistance Sitting-balance support: Bilateral upper extremity supported Sitting balance-Leahy Scale: Fair     Standing balance support: Bilateral upper extremity supported Standing balance-Leahy Scale: Fair                               Pertinent Vitals/Pain Pain Assessment:  (He reports as very mild.)    Home Living Family/patient expects to be discharged to:: Skilled nursing facility (7 steps into house, high threshold to get into tub.) Living Arrangements: Children                    Prior Function Level of Independence: Independent with assistive device(s);Independent         Comments: Patient has been using cane it appears PRN in the house.      Hand Dominance        Extremity/Trunk Assessment   Upper Extremity Assessment: Overall WFL for tasks assessed           Lower Extremity Assessment: RLE deficits/detail RLE Deficits / Details: Able  to complete SLRs and LAQs with very minimal assistance.        Communication   Communication: No difficulties  Cognition Arousal/Alertness: Awake/alert Behavior During Therapy: WFL for tasks assessed/performed Overall Cognitive Status: Within Functional Limits for tasks assessed                      General Comments General comments (skin integrity, edema, etc.): Drains, lines, tubes in tact.     Exercises Total Joint Exercises Ankle Circles/Pumps: AROM;Both;15  reps Gluteal Sets: AROM;Both;10 reps Heel Slides: AROM;Both;10 reps Hip ABduction/ADduction: AROM;Both;10 reps Straight Leg Raises: AROM;Left;AAROM;Right;10 reps Long Arc Quad: AROM;Left;AAROM;Right;15 reps      Assessment/Plan    PT Assessment Patient needs continued PT services  PT Diagnosis Difficulty walking   PT Problem List Decreased strength;Decreased mobility;Decreased knowledge of precautions;Decreased activity tolerance;Decreased balance;Decreased knowledge of use of DME  PT Treatment Interventions DME instruction;Gait training;Therapeutic exercise;Therapeutic activities;Manual techniques;Stair training;Balance training   PT Goals (Current goals can be found in the Care Plan section) Acute Rehab PT Goals Patient Stated Goal: To get stronger  PT Goal Formulation: With patient Time For Goal Achievement: 10/26/15 Potential to Achieve Goals: Good    Frequency BID   Barriers to discharge Inaccessible home environment;Decreased caregiver support Patient has 7 steps in and is at home alone throughout the day.     Co-evaluation               End of Session Equipment Utilized During Treatment: Gait belt (O2 dc'd as patient was satting in the upper 90s on RA throughout session.) Activity Tolerance: Patient tolerated treatment well Patient left: in chair;with chair alarm set;with call bell/phone within reach Nurse Communication: Mobility status         Time: [id number] PT Time Calculation (min) (ACUTE ONLY): 28 min   Charges:   PT Evaluation $PT Eval Moderate Complexity: 1 Procedure PT Treatments $Therapeutic Exercise: 8-22 mins   PT G Codes:       Kerman Passey, PT, DPT    10/12/2015, 6:24 PM

## 2015-10-12 NOTE — Anesthesia Postprocedure Evaluation (Signed)
Anesthesia Post Note  Patient: Dennis Savage  Procedure(s) Performed: Procedure(s) (LRB): TOTAL HIP ARTHROPLASTY (Right)  Patient location during evaluation: PACU Anesthesia Type: General Level of consciousness: awake and alert Pain management: pain level controlled Vital Signs Assessment: post-procedure vital signs reviewed and stable Respiratory status: spontaneous breathing, nonlabored ventilation, respiratory function stable and patient connected to nasal cannula oxygen Cardiovascular status: blood pressure returned to baseline and stable Postop Assessment: no signs of nausea or vomiting Anesthetic complications: no    Last Vitals:  Filed Vitals:   10/12/15 1539 10/12/15 1632  BP: 126/66 121/64  Pulse: 61 66  Temp: 36.2 C 36.4 C  Resp: 18 18    Last Pain:  Filed Vitals:   10/12/15 1858  PainSc: [place]

## 2015-10-12 NOTE — Op Note (Signed)
OPERATIVE NOTE  DATE OF SURGERY:  10/12/2015  PATIENT NAME:  Dennis Savage   DOB: 1929/08/03  MRN: [id number]  PRE-OPERATIVE DIAGNOSIS: Degenerative arthrosis of the right hip, primary  POST-OPERATIVE DIAGNOSIS:  Same  PROCEDURE:  Right total hip arthroplasty  SURGEON:  Marciano Sequin. M.D.  ASSISTANT:  Vance Peper, PA (present and scrubbed throughout the case, critical for assistance with exposure, retraction, instrumentation, and closure)  ANESTHESIA: spinal  ESTIMATED BLOOD LOSS: 250 mL  FLUIDS REPLACED: 1400 mL of crystalloid  DRAINS: 2 medium drains to a Hemovac reservoir  IMPLANTS UTILIZED: DePuy 15 mm large stature AML femoral stem, 56 mm OD Pinnacle 100 acetabular component, neutral Pinnacle Marathon polyethylene insert, and a 36 mm M-SPEC +1.5 mm hip ball  INDICATIONS FOR SURGERY: Dennis Savage is a 82 y.o. year old male with a long history of progressive hip and groin  pain. X-rays demonstrated severe degenerative changes. The patient had not seen any significant improvement despite conservative nonsurgical intervention. After discussion of the risks and benefits of surgical intervention, the patient expressed understanding of the risks benefits and agree with plans for total hip arthroplasty.   The risks, benefits, and alternatives were discussed at length including but not limited to the risks of infection, bleeding, nerve injury, stiffness, blood clots, the need for revision surgery, limb length inequality, dislocation, cardiopulmonary complications, among others, and they were willing to proceed.  PROCEDURE IN DETAIL: The patient was brought into the operating room and, after adequate spinal anesthesia was achieved, the patient was placed in a left lateral decubitus position. Axillary roll was placed and all bony prominences were well-padded. The patient's right hip was cleaned and prepped with alcohol and DuraPrep and draped in the usual sterile fashion. A "timeout"  was performed as per usual protocol. A lateral curvilinear incision was made gently curving towards the posterior superior iliac spine. The IT band was incised in line with the skin incision and the fibers of the gluteus maximus were split in line. The piriformis tendon was identified, skeletonized, and incised at its insertion to the proximal femur and reflected posteriorly. A T type posterior capsulotomy was performed. Prior to dislocation of the femoral head, a threaded Steinmann pin was inserted through a separate stab incision into the pelvis superior to the acetabulum and bent in the form of a stylus so as to assess limb length and hip offset throughout the procedure. The femoral head was then dislocated posteriorly. Inspection of the femoral head demonstrated severe degenerative changes with full-thickness loss of articular cartilage. The femoral neck cut was performed using an oscillating saw. The anterior capsule was elevated off of the femoral neck using a periosteal elevator. Attention was then directed to the acetabulum. The remnant of the labrum was excised using electrocautery. Inspection of the acetabulum also demonstrated significant degenerative changes. The acetabulum was reamed in sequential fashion up to a 55 mm diameter. Good punctate bleeding bone was encountered. A 56 mm Pinnacle 100 acetabular component was positioned and impacted into place. Good scratch fit was appreciated. A neutral polyethylene trial was inserted.  Attention was then directed to the proximal femur. A hole for reaming of the proximal femoral canal was created using a high-speed burr. The femoral canal was reamed in sequential fashion up to a 14.5 mm diameter. This allowed for approximately 5 cm of scratch fit, with an extremely tight fit. It was thus elected to ream up to a 15 mm diameter to allow for a line to  line fit. Serial broaches were inserted up to a 15 mm large stature femoral broach. Calcar region was planed  and a trial reduction was performed using a 36 mm hip ball with a +1.5 mm neck length. Good equalization of limb lengths and hip offset was appreciated and excellent stability was noted both anteriorly and posteriorly. Trial components were removed. The acetabular shell was irrigated with copious amounts of normal saline with antibiotic solution and suctioned dry. A neutral Pinnacle Marathon polyethylene insert was positioned and impacted into place. Next, a 15 mm large stature AML femoral stem was positioned and impacted into place. Excellent scratch fit was appreciated. A trial reduction was again performed with a 36 mm hip ball with a +1.5 mm neck length. Again, good equalization of limb lengths was appreciated and excellent stability appreciated both anteriorly and posteriorly. The hip was then dislocated and the trial hip ball was removed. The Morse taper was cleaned and dried. A 36 mm M-SPEC hip ball with a +1.5 mm neck length was placed on the trunnion and impacted into place. The hip was then reduced and placed through range of motion. Excellent stability was appreciated both anteriorly and posteriorly.  The wound was irrigated with copious amounts of normal saline with antibiotic solution and suctioned dry. Good hemostasis was appreciated. The posterior capsulotomy was repaired using #5 Ethibond. Piriformis tendon was reapproximated to the undersurface of the gluteus medius tendon using #5 Ethibond. Two medium drains were placed in the wound bed and brought out through separate stab incisions to be attached to a Hemovac reservoir. The IT band was reapproximated using interrupted sutures of #1 Vicryl. Subcutaneous tissue was approximated using first #0 Vicryl followed by #2-0 Vicryl. The skin was closed with skin staples.  The patient tolerated the procedure well and was transported to the recovery room in stable condition.   Marciano Sequin., M.D.

## 2015-10-12 NOTE — Care Management Note (Signed)
Case Management Note  Patient Details  Name: Dennis Savage MRN: [id number] Date of Birth: 17-Dec-1929  Subjective/Objective:    Spoke with patient who is alert and oriented from home. Patient daughters present in room. Patient lives with adult daughters who work during the day.  Patient has a cane and a front rolling walker and stated that he cannot maneuver walker well inside home due to small passageways. Stated that home is a trailer. Has 6 steps to climb to get into the home. No ramp. Gets medications filled at [facility] on [address]   Patient is on Xarelto chronically for Affib. Will need PT evaluation prior to discharge. Patient would like to go to [facility] or [facility] if SNF recommended.            Action/Plan: Awaiting PT evaluation.   Expected Discharge Date:  10/14/15               Expected Discharge Plan:  Skilled Nursing Facility  In-House Referral:  Clinical Social Work  Discharge planning Services  CM Consult  Post Acute Care Choice:    Choice offered to:  Patient, Adult Children  DME Arranged:    DME Agency:     HH Arranged:    [place] Agency:     Status of Service:  In process, will continue to follow  If discussed at Long Length of Stay Meetings, dates discussed:    Additional Comments:  Alvie Heidelberg, RN 10/12/2015, 2:04 PM

## 2015-10-12 NOTE — H&P (Signed)
The patient has been re-examined, and the chart reviewed, and there have been no interval changes to the documented history and physical.    The risks, benefits, and alternatives have been discussed at length. The patient expressed understanding of the risks benefits and agreed with plans for surgical intervention.  James P. Hooten, Jr. M.D.    

## 2015-10-12 NOTE — Transfer of Care (Signed)
Immediate Anesthesia Transfer of Care Note  Patient: Dennis Savage  Procedure(s) Performed: Procedure(s): TOTAL HIP ARTHROPLASTY (Right)  Patient Location: PACU  Anesthesia Type:Spinal  Level of Consciousness: awake, alert , oriented and patient cooperative  Airway & Oxygen Therapy: Patient Spontanous Breathing and Patient connected to face mask oxygen  Post-op Assessment: Report given to RN and Post -op Vital signs reviewed and stable  Post vital signs: Reviewed and stable  Last Vitals:  Filed Vitals:   10/12/15 0622 10/12/15 1041  BP: 150/77 104/67  Pulse: 64 71  Temp: 36 C 36.1 C  Resp: 18 12    Last Pain: There were no vitals filed for this visit.       Complications: No apparent anesthesia complications

## 2015-10-12 NOTE — Brief Op Note (Signed)
10/12/2015  10:41 AM  PATIENT:  Dennis Savage  82 y.o. male  PRE-OPERATIVE DIAGNOSIS:  osteoarthritis of the right hip  POST-OPERATIVE DIAGNOSIS:  Same  PROCEDURE:  Procedure(s): TOTAL HIP ARTHROPLASTY (Right)  SURGEON:  Surgeon(s) and Role:    * Dereck Leep, MD - Primary  ASSISTANTS: Vance Peper, PA   ANESTHESIA:   spinal  EBL:  Total I/O In: 1400 [I.V.:1400] Out: 650 [Urine:400; Blood:250]  BLOOD ADMINISTERED:none  DRAINS: 2 medium Hemovac drains   LOCAL MEDICATIONS USED:  NONE  SPECIMEN:  Source of Specimen:  Right femoral head  DISPOSITION OF SPECIMEN:  PATHOLOGY  COUNTS:  YES  TOURNIQUET:  * No tourniquets in log *  DICTATION: .Dragon Dictation  PLAN OF CARE: Admit to inpatient   PATIENT DISPOSITION:  PACU - hemodynamically stable.   Delay start of Pharmacological VTE agent (>24hrs) due to surgical blood loss or risk of bleeding: yes

## 2015-10-13 LAB — CBC
HCT: 38.1 % — ABNORMAL LOW (ref 40.0–52.0)
Hemoglobin: 13.4 g/dL (ref 13.0–18.0)
MCH: 31.4 pg (ref 26.0–34.0)
MCHC: 35.1 g/dL (ref 32.0–36.0)
MCV: 89.5 fL (ref 80.0–100.0)
Platelets: 175 10*3/uL (ref 150–440)
RBC: 4.26 MIL/uL — ABNORMAL LOW (ref 4.40–5.90)
RDW: 13.3 % (ref 11.5–14.5)
WBC: 13.6 10*3/uL — ABNORMAL HIGH (ref 3.8–10.6)

## 2015-10-13 LAB — BASIC METABOLIC PANEL
Anion gap: 8 (ref 5–15)
BUN: 25 mg/dL — ABNORMAL HIGH (ref 6–20)
CO2: 25 mmol/L (ref 22–32)
Calcium: 8.4 mg/dL — ABNORMAL LOW (ref 8.9–10.3)
Chloride: 101 mmol/L (ref 101–111)
Creatinine, Ser: 1.22 mg/dL (ref 0.61–1.24)
GFR calc Af Amer: >60 mL/min (ref >60)
GFR calc non Af Amer: 53 mL/min — ABNORMAL LOW (ref >60)
Glucose, Bld: 129 mg/dL — ABNORMAL HIGH (ref 65–99)
Potassium: 3.2 mmol/L — ABNORMAL LOW (ref 3.5–5.1)
Sodium: 134 mmol/L — ABNORMAL LOW (ref 135–145)

## 2015-10-13 MED ORDER — PNEUMOCOCCAL VAC POLYVALENT 25 MCG/0.5ML IJ INJ
0.5000 mL | INJECTION | INTRAMUSCULAR | Status: AC
  Administered 2015-10-14: 0.5 mL via INTRAMUSCULAR
  Filled 2015-10-13: qty 0.5

## 2015-10-13 MED ORDER — TRAMADOL HCL 50 MG PO TABS: 50.0000 mg | ORAL_TABLET | ORAL | Status: DC | PRN

## 2015-10-13 MED ORDER — POTASSIUM CHLORIDE 20 MEQ PO PACK
40.0000 meq | PACK | Freq: Two times a day (BID) | ORAL | Status: DC
  Administered 2015-10-13 – 2015-10-15 (×5): 40 meq via ORAL
  Filled 2015-10-13 (×7): qty 2

## 2015-10-13 MED ORDER — OXYCODONE HCL 5 MG PO TABS: 5.0000 mg | ORAL_TABLET | ORAL | Status: DC | PRN

## 2015-10-13 NOTE — Clinical Social Work Placement (Signed)
   CLINICAL SOCIAL WORK PLACEMENT  NOTE  Date:  10/13/2015  Patient Details  Name: Dennis Savage MRN: [id number] Date of Birth: Jul 23, 1929  Clinical Social Work is seeking post-discharge placement for this patient at the [place] level of care (*CSW will initial, date and re-position this form in  chart as items are completed):  Yes   Patient/family provided with [facility] Work Department's list of facilities offering this level of care within the geographic area requested by the patient (or if unable, by the patient's family).  Yes   Patient/family informed of their freedom to choose among providers that offer the needed level of care, that participate in Medicare, Medicaid or managed care program needed by the patient, have an available bed and are willing to accept the patient.  Yes   Patient/family informed of [facility]'s ownership interest in [facility] and [facility], as well as of the fact that they are under no obligation to receive care at these facilities.  PASRR submitted to EDS on       PASRR number received on       Existing PASRR number confirmed on 10/13/15     FL2 transmitted to all facilities in geographic area requested by pt/family on 10/13/15     FL2 transmitted to all facilities within larger geographic area on       Patient informed that his/her managed care company has contracts with or will negotiate with certain facilities, including the following:        Yes   Patient/family informed of bed offers received.  Patient chooses bed at  ([facility]     Physician recommends and patient chooses bed at      Patient to be transferred to   on  .  Patient to be transferred to facility by       Patient family notified on   of transfer.  Name of family member notified:        PHYSICIAN       Additional Comment:    _______________________________________________ Loralyn Freshwater, LCSW 10/13/2015, 5:25 PM

## 2015-10-13 NOTE — Progress Notes (Addendum)
   Subjective: 1 Day Post-Op Procedure(s) (LRB): TOTAL HIP ARTHROPLASTY (Right) Patient reports pain as 1 on 0-10 scale.   Patient is well, and has had no acute complaints or problems We will start therapy today.  Plan is to go Rehab after hospital stay. no nausea and no vomiting Patient denies any chest pains or shortness of breath. Do not well during the night. Patient does have a history of sleep apnea which does create some issues. Likes to sleep on his left side  Objective: Vital signs in last 24 hours: Temp:  [96.3 F (35.7 C)-98.4 F (36.9 C)] 98.4 F (36.9 C) (06/29 0556) Pulse Rate:  [61-81] 63 (06/29 0556) Resp:  [12-20] 19 (06/29 0556) BP: (101-127)/(60-74) 110/60 mmHg (06/29 0556) SpO2:  [92 %-99 %] 96 % (06/29 0556) well approximated incision Heels are non tender and elevated off the bed using rolled towels Intake/Output from previous day: 06/28 0701 - 06/29 0700 In: 4911.7 [P.O.:960; I.V.:3206.7; IV Piggyback:710] Out: 2430 [Urine:2100; Drains:80; Blood:250] Intake/Output this shift:     Recent Labs  10/13/15 0327  HGB 13.4    Recent Labs  10/13/15 0327  WBC 13.6*  RBC 4.26*  HCT 38.1*  PLT 175    Recent Labs  10/13/15 0327  NA 134*  K 3.2*  CL 101  CO2 25  BUN 25*  CREATININE 1.22  GLUCOSE 129*  CALCIUM 8.4*    Recent Labs  10/12/15 0621  INR 1.01    EXAM General - Patient is Alert, Appropriate and Oriented Extremity - Neurologically intact Neurovascular intact Sensation intact distally Intact pulses distally Dorsiflexion/Plantar flexion intact Compartment soft Dressing - scant drainage Motor Function - intact, moving foot and toes well on exam.    Past Medical History  Diagnosis Date  . Hypertension   . Atrial fibrillation ([place])   . Benign essential HTN 11/03/2010  . Peripheral vascular disease ([place]) 11/03/2010  . Chronic obstructive pulmonary disease ([place]) 11/03/2010  . Interstitial lung disease ([place]) 10/13/2013  .  Acute diarrhea 02/25/2015  . CN (constipation) 06/15/2011  . After cataract not obscuring vision 12/21/2011  . 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 Apnea, sleep 07/13/2015  . Arthralgia of multiple joints 06/15/2011  . Artificial lens present 12/08/2010  . 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 Dizziness 02/25/2015  . LBP (low back pain) 11/03/2010  . 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 Retinal hemorrhage 03/16/2014  . Heart murmur   . GERD (gastroesophageal reflux disease)   . Arthritis     Assessment/Plan: 1 Day Post-Op Procedure(s) (LRB): TOTAL HIP ARTHROPLASTY (Right) Active Problems:   S/P total hip arthroplasty  Estimated body mass index is 31.29 kg/(m^2) as calculated from the following:   Height as of this encounter: 5\' 9"  (1.753 m).   Weight as of this encounter: 96.163 kg (212 lb). Advance diet Up with therapy D/C IV fluids Discharge to SNF  Labs: K+ 3.2 added klor con DVT Prophylaxis - Xarelto, Foot Pumps and TED hose Weight-Bearing as tolerated to right leg D/C O2 and Pulse OX and try on Room Air Begin working on a bowel movement Labs tomorrow am  Jillyn Ledger. Antonito Beloit 10/13/2015, 7:16 AM

## 2015-10-13 NOTE — Progress Notes (Signed)
Physical Therapy Treatment Patient Details Name: ZACKARY KOSE MRN: [id number] DOB: [birth year] Today's Date: 10/13/2015    History of Present Illness Patient is a pleasant 82 y/o male that presents for R THR.     PT Comments    Pt agreeable to PT. Pain in right hip manageable. Pt continues to require assist for bed mobility and transfers, but demonstrates good effort. Mild unsteadiness with stand and initial steps with retropulsive tendencies, but improves. Pt demonstrates short ambulation that is antalgic and effortful. Pt participates well in seated and long sit exercises. Continue PT to progress strength, endurance, bed mobility, transfers, and ambulation to improve functional mobility. Plan to see pt again this afternoon.   Follow Up Recommendations        Equipment Recommendations       Recommendations for Other Services       Precautions / Restrictions Precautions Precautions: Posterior Hip;Fall Restrictions Weight Bearing Restrictions: Yes RLE Weight Bearing: Weight bearing as tolerated    Mobility  Bed Mobility                  Transfers                    Ambulation/Gait                 Stairs            Wheelchair Mobility    Modified Rankin (Stroke Patients Only)       Balance                                    Cognition Arousal/Alertness: Awake/alert   Overall Cognitive Status: Within Functional Limits for tasks assessed                      Exercises      General Comments        Pertinent Vitals/Pain Pain Assessment: 0-10 Pain Score: 2     Home Living Family/patient expects to be discharged to:: Skilled nursing facility Living Arrangements: Children                  Prior Function Level of Independence: Independent with assistive device(s);Independent          PT Goals (current goals can now be found in the care plan section) Acute Rehab PT Goals Patient Stated Goal: To  get stronger    Frequency       PT Plan      Co-evaluation             End of Session           Time:  -     Charges:                       G CodesCharlaine Dalton, PTA 10/13/2015, 3:35 PM

## 2015-10-13 NOTE — Progress Notes (Signed)
Physical Therapy Treatment Patient Details Name: Dennis Savage MRN: [id number] DOB: Aug 28, 1929 Today's Date: 10/13/2015    History of Present Illness      PT Comments    Pt had just returned to bed before PT arrived. Pt fatigued and does not wish out of bed at this time. Agreeable to supine exercises. Pt notes pain has increased to 4/10 in right hip. Pt participates well with supine exercises with assist as needed on right. Pt increasingly lethargic during session, but able to encourage through. Continue PT to progress strength, endurance, bed mobility, transfers, balance and distance/quality of gait to improve functional mobility.   Follow Up Recommendations  SNF     Equipment Recommendations  Rolling walker with 5" wheels    Recommendations for Other Services       Precautions / Restrictions Restrictions Weight Bearing Restrictions: Yes RLE Weight Bearing: Weight bearing as tolerated    Mobility  Bed Mobility                  Transfers                    Ambulation/Gait                 Stairs            Wheelchair Mobility    Modified Rankin (Stroke Patients Only)       Balance                                    Cognition Arousal/Alertness: Lethargic Behavior During Therapy: WFL for tasks assessed/performed Overall Cognitive Status: Within Functional Limits for tasks assessed                      Exercises Total Joint Exercises Ankle Circles/Pumps: AROM;Both;20 reps;Supine Quad Sets: Strengthening;Both;10 reps (long sit) Gluteal Sets: Strengthening;Both;10 reps (long sit) Towel Squeeze: Strengthening;Both;10 reps (long sit ) Short Arc Quad: AROM;Strengthening;Both;10 reps;Supine (2 sets) Heel Slides: AROM;Both;10 reps;Supine Hip ABduction/ADduction: Both;10 reps;AAROM;Supine (with knee extended; single leg at a time) Straight Leg Raises: AAROM;Both;10 reps;Supine    General Comments         Pertinent Vitals/Pain Pain Assessment: 0-10 Pain Score: 4  Pain Location: R hip Pain Descriptors / Indicators: Aching;Constant Pain Intervention(s): Premedicated before session;Monitored during session    Home Living                      Prior Function            PT Goals (current goals can now be found in the care plan section) Progress towards PT goals: Progressing toward goals    Frequency  BID    PT Plan Current plan remains appropriate    Co-evaluation             End of Session   Activity Tolerance: Patient tolerated treatment well;Patient limited by fatigue Patient left: with call bell/phone within reach;in bed;with bed alarm set;with nursing/sitter in room;with SCD's reapplied     Time: 1545-1605 PT Time Calculation (min) (ACUTE ONLY): 20 min  Charges:  $Therapeutic Exercise: 8-22 mins                    G CodesCharlaine Dalton, PTA 10/13/2015, 4:08 PM

## 2015-10-13 NOTE — Clinical Social Work Note (Signed)
Clinical Social Work Assessment  Patient Details  Name: Dennis Savage MRN: [id number] Date of Birth: [birth year]  Date of referral:  10/13/15               Reason for consult:  Facility Placement                Permission sought to share information with:  Chartered certified accountant granted to share information::  Yes, Verbal Permission Granted  Name::      [place]::   [place]   Relationship::     Contact Information:     Housing/Transportation Living arrangements for the past 2 months:  [place] of Information:  Patient Patient Interpreter Needed:  None Criminal Activity/Legal Involvement Pertinent to Current Situation/Hospitalization:  No - Comment as needed Significant Relationships:  Adult Children Lives with:  Adult Children Do you feel safe going back to the place where you live?  Yes Need for family participation in patient care:  Yes (Comment)  Care giving concerns:  Patient lives with his 2 adult daughters in [place].    Social Worker assessment / plan:  Holiday representative (CSW) received SNF consult. PT is recommending SNF. CSW met with patient alone to discuss D/C plan. Patient was alert and oriented and was sitting up in the bed. CSW introduced self and explained role of CSW department. Patient reported that he lives in [place] with his 2 daughters. CSW explained SNF process and that he will require a 3 night qualifying inpatient stay at [facility] in order for Medicare to pay for SNF. Patient was admitted to inpatient on 10/12/15. Patient is agreeable to SNF search in [facility]   FL2 complete and faxed out. CSW presented bed offers. [facility] chose [facility]. [facility] liaison is aware of accepted bed offer.   Employment status:  Retired Forensic scientist:  Medicare PT Recommendations:  [place] / Referral to community resources:  [place]  Patient/Family's Response to care:  Patient is agreeable to going to [facility].   Patient/Family's Understanding of and Emotional Response to Diagnosis, Current Treatment, and Prognosis:  Patient was pleasant and thanked CSW for visit.   Emotional Assessment Appearance:  Appears stated age Attitude/Demeanor/Rapport:    Affect (typically observed):  Accepting, Adaptable, Pleasant Orientation:  Oriented to Self, Oriented to Place, Oriented to  Time, Oriented to Situation Alcohol / Substance use:  Not Applicable Psych involvement (Current and /or in the community):  No (Comment)  Discharge Needs  Concerns to be addressed:  Discharge Planning Concerns Readmission within the last 30 days:  No Current discharge risk:  Dependent with Mobility Barriers to Discharge:  Continued Medical Work up   [name] 10/13/2015, 5:26 PM

## 2015-10-13 NOTE — Evaluation (Addendum)
Occupational Therapy Evaluation Patient Details Name: EDELMIRO NIEMI MRN: [id number] DOB: 1929-07-28 Today's Date: 10/13/2015    History of Present Illness Patient is a pleasant 82 y/o male that presents for R THR.    Clinical Impression   Pt. Is a 82 y.o. Male who was admitted to [facility] for a right THR(Posterior approach). Pt. Presents with pain, limited ROM, weakness, limited activity tolerance, and impaired functional mobility for ADLs which hinder his ability to complete ADL tasks. Pt. Could benefit from skilled OT services for ADL training, A/E training, UE therapeutic, ex., functional mobility for ADLs, and pt. Education about home modifications and DME.    Follow Up Recommendations  No OT follow up    Equipment Recommendations       Recommendations for Other Services PT consult     Precautions / Restrictions Precautions Precautions: Posterior Hip;Fall Restrictions Weight Bearing Restrictions: Yes RLE Weight Bearing: Weight bearing as tolerated      Balance Overall balance assessment: Needs assistance Sitting-balance support: Bilateral upper extremity supported;Feet supported Sitting balance-Leahy Scale: Good Sitting balance - Comments: Initial "wooziness"; possibly due to overly hot room                            ADL Overall ADL's : Needs assistance/impaired Eating/Feeding: Set up       Upper Body Bathing: Set up           Lower Body Dressing: Maximal assistance               Functional mobility during ADLs: Minimal assistance       Vision     Perception     Praxis      Pertinent Vitals/Pain Pain Assessment: 0-10 Pain Score: 2  Pain Location: R hip Pain Descriptors / Indicators: Aching (with movement) Pain Intervention(s): Monitored during session;Premedicated before session;Repositioned     Hand Dominance     Extremity/Trunk Assessment Upper Extremity Assessment Upper Extremity Assessment: Overall WFL for tasks  assessed           Communication Communication Communication: No difficulties   Cognition Arousal/Alertness: Awake/alert Behavior During Therapy: WFL for tasks assessed/performed Overall Cognitive Status: Within Functional Limits for tasks assessed       Memory: Decreased recall of precautions (Remembers 2 of 3; requires cues for functional application)             General Comments   Pt. Education about A/E use for LE ADLs.    Exercises       Shoulder Instructions      Home Living Family/patient expects to be discharged to:: Skilled nursing facility Living Arrangements: Children                                      Prior Functioning/Environment Level of Independence: Independent with assistive device(s);Independent             OT Diagnosis: Generalized weakness   OT Problem List: Decreased strength;Decreased range of motion;Decreased activity tolerance;Decreased knowledge of use of DME or AE;Decreased safety awareness   OT Treatment/Interventions: Self-care/ADL training;Therapeutic exercise;Therapeutic activities;DME and/or AE instruction;Patient/family education    OT Goals(Current goals can be found in the care plan section) Acute Rehab OT Goals Patient Stated Goal: To get stronger OT Goal Formulation: With patient/family  OT Frequency: Min 1X/week   Barriers to D/C:  Co-evaluation              End of Session    Activity Tolerance: Patient tolerated treatment well Patient left: in chair;with call bell/phone within reach;with chair alarm set   Time: 1105-1135 OT Time Calculation (min): 30 min Charges:  OT General Charges $OT Visit: 1 Procedure OT Evaluation $OT Eval Moderate Complexity: 1 Procedure OT Treatments $Self Care/Home Management : 8-22 mins G-Codes:    Harrel Carina, MS, OTR/L  Harrel Carina 10/13/2015, 12:07 PM

## 2015-10-13 NOTE — Progress Notes (Signed)
Foley d/c'd at [place] with 450cc urine output.

## 2015-10-13 NOTE — Discharge Summary (Signed)
Physician Discharge Summary  Patient ID: Dennis Savage MRN: [id number] DOB/AGE: 1929-10-18 82 y.o.  Admit date: 10/12/2015 Discharge date: 10/15/2015  Admission Diagnoses:  osteoarthritis   Discharge Diagnoses: Patient Active Problem List   Diagnosis Date Noted  . 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 Degenerative arthritis of hip 09/12/2015  . Atrial fibrillation ([place]) 07/13/2015  . Apnea, sleep 07/13/2015  . Acute diarrhea 02/25/2015  . Dizziness 02/25/2015  . 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 Lymphangioma, 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  . Retinal hemorrhage 03/16/2014  . Interstitial lung disease ([place]) 10/13/2013  . After cataract not obscuring vision 12/21/2011  . History of surgical procedure 12/21/2011  . CN (constipation) 06/15/2011  . Encounter for general adult medical examination without abnormal findings 06/15/2011  . Arthralgia of multiple joints 06/15/2011  . 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 Artificial lens present 12/08/2010  . 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 Benign essential HTN 11/03/2010  . Chronic obstructive pulmonary disease ([place]) 11/03/2010  . Benign hypertension 11/03/2010  . LBP (low back pain) 11/03/2010  . Peripheral vascular disease ([place]) 11/03/2010  . Current tobacco use 11/03/2010    Past Medical History  Diagnosis Date  . Hypertension   . Atrial fibrillation ([place])   . Benign essential HTN 11/03/2010  . Peripheral vascular disease ([place]) 11/03/2010  . Chronic obstructive pulmonary disease ([place]) 11/03/2010  . Interstitial lung disease ([place]) 10/13/2013  . Acute diarrhea 02/25/2015  . CN (constipation) 06/15/2011  . After cataract not obscuring vision 12/21/2011  . 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 Apnea, sleep 07/13/2015  . Arthralgia of multiple joints 06/15/2011  . Artificial lens present 12/08/2010  . 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 Dizziness 02/25/2015  . LBP (low back pain) 11/03/2010  . 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 Retinal  hemorrhage 03/16/2014  . Heart murmur   . GERD (gastroesophageal reflux disease)   . Arthritis      Transfusion: No transfusions given doing this admission   Consultants (if any):   case management for assistance with placement  Discharged Condition: Improved  Hospital Course: Dennis Savage is an 82 y.o. male who was admitted 10/12/2015 with a diagnosis of degenerative arthrosis right hip and went to the operating room on 10/12/2015 and underwent the above named procedures.    Surgeries:Procedure(s): TOTAL HIP ARTHROPLASTY on 10/12/2015  PRE-OPERATIVE DIAGNOSIS: Degenerative arthrosis of the right hip, primary  POST-OPERATIVE DIAGNOSIS: Same  PROCEDURE: Right total hip arthroplasty  SURGEON: Marciano Sequin. M.D.  ASSISTANT: Vance Peper, PA (present and scrubbed throughout the case, critical for assistance with exposure, retraction, instrumentation, and closure)  ANESTHESIA: spinal  ESTIMATED BLOOD LOSS: 250 mL  FLUIDS REPLACED: 1400 mL of crystalloid  DRAINS: 2 medium drains to a Hemovac reservoir  IMPLANTS UTILIZED: DePuy 15 mm large stature AML femoral stem, 56 mm OD Pinnacle 100 acetabular component, neutral Pinnacle Marathon polyethylene insert, and a 36 mm M-SPEC +1.5 mm hip ball  INDICATIONS FOR SURGERY: Dennis Savage is a 82 y.o. year old male with a long history of progressive hip and groin pain. X-rays demonstrated severe degenerative changes. The patient had not seen any significant improvement despite conservative nonsurgical intervention. After discussion of the risks and benefits of surgical intervention, the patient expressed understanding of the risks benefits and agree with plans for total hip arthroplasty.   The risks, benefits, and alternatives were discussed at length including but not limited to the risks of infection, bleeding, nerve injury, stiffness, blood clots, the need for revision surgery,  limb length inequality, dislocation, cardiopulmonary  complications, among others, and they were willing to proceed. Patient tolerated the surgery well. No complications .Patient was taken to PACU where she was stabilized and then transferred to the orthopedic floor.  Patient started on Xarelto q 24 hrs. Foot pumps applied bilaterally at 80 mm hg. Heels elevated off bed with rolled towels. No evidence of DVT. Calves non tender. Negative [place]oman. Physical therapy started on day #1 for gait training and transfer with OT starting on  day #1 for ADL and assisted devices. Patient has done well with therapy. Ambulated 22 feet upon being discharged. The patient had a bowel movement on postop day 2.   Patient's IV and Foley were discontinued on day #1 with the Hemovac being discontinued on day #2. Dressing was also changed on day #2.   He was given perioperative antibiotics:      Anti-infectives    Start     Dose/Rate Route Frequency Ordered Stop   10/12/15 1145  clindamycin (CLEOCIN) IVPB 600 mg     600 mg 100 mL/hr over 30 Minutes Intravenous Every 6 hours 10/12/15 1138 10/13/15 0531   10/12/15 0555  clindamycin (CLEOCIN) 900 MG/50ML IVPB    Comments:  Ronnell [place]: cabinet override      10/12/15 0555 10/12/15 1759   10/12/15 0507  clindamycin (CLEOCIN) IVPB 900 mg     900 mg 100 mL/hr over 30 Minutes Intravenous On call to O.R. 10/12/15 0507 10/12/15 0739    .  He was fitted with AV 1 compression foot pump devices, instructed on heel pumps early ambulation, and TED stockings bilaterally for DVT prophylaxis.  He benefited maximally from the hospital stay and there were no complications.    Recent vital signs:  Filed Vitals:   10/14/15 1959 10/15/15 0430  BP: 128/67 129/72  Pulse: 80 82  Temp: 99.6 F (37.6 C) 98.5 F (36.9 C)  Resp: 20 22    Recent laboratory studies:  Lab Results  Component Value Date   HGB 13.1 10/14/2015   HGB 13.4 10/13/2015   HGB 15.1 10/05/2015   Lab Results  Component Value Date   WBC 12.7* 10/14/2015    PLT 160 10/14/2015   Lab Results  Component Value Date   INR 1.01 10/12/2015   Lab Results  Component Value Date   NA 135 10/14/2015   K 3.4* 10/14/2015   CL 100* 10/14/2015   CO2 29 10/14/2015   BUN 23* 10/14/2015   CREATININE 1.10 10/14/2015   GLUCOSE 132* 10/14/2015    Discharge Medications:     Medication List    TAKE these medications        acetaminophen 325 MG tablet  Commonly known as:  TYLENOL  Take 650 mg by mouth every 6 (six) hours as needed.     amLODipine 10 MG tablet  Commonly known as:  NORVASC  Take 10 mg by mouth daily.     chlorthalidone 25 MG tablet  Commonly known as:  HYGROTON  Take 25 mg by mouth daily as needed.     docusate sodium 250 MG capsule  Commonly known as:  COLACE  Take 250 mg by mouth daily as needed for constipation.     oxyCODONE 5 MG immediate release tablet  Commonly known as:  Oxy IR/ROXICODONE  Take 1-2 tablets (5-10 mg total) by mouth every 4 (four) hours as needed for severe pain.     PRESERVISION AREDS 2 Caps  Take 2 capsules by mouth  daily.     tamsulosin 0.4 MG Caps capsule  Commonly known as:  FLOMAX  Take 1 capsule (0.4 mg total) by mouth daily.     traMADol 50 MG tablet  Commonly known as:  ULTRAM  Take 1-2 tablets (50-100 mg total) by mouth every 4 (four) hours as needed for moderate pain.     XARELTO 20 MG Tabs tablet  Generic drug:  rivaroxaban  Take 20 mg by mouth daily with supper.        Diagnostic Studies: Dg Hip Port Unilat With Pelvis 1v Right  10/12/2015  CLINICAL DATA:  Postop right total hip arthroplasty. EXAM: DG HIP (WITH OR WITHOUT PELVIS) 1V PORT RIGHT COMPARISON:  Abdominal radiographs 08/10/2014 FINDINGS: The AP view of the lower pelvis and cross-table lateral view of the right hip demonstrate recent right total hip arthroplasty. The hardware is well positioned. There is no evidence of acute fracture or dislocation. A surgical drain and surgical clips are in place with a small amount of  soft tissue emphysema surrounding the hip. IMPRESSION: No demonstrated complication following right total hip arthroplasty. Electronically Signed   By: Richardean Sale M.D.   On: 10/12/2015 11:37    Disposition: 02-Transferred to [facility]  Discharge Instructions    Diet - low sodium heart healthy    Complete by:  As directed      Increase activity slowly    Complete by:  As directed            Follow-up Information    Follow up with Dereck Leep, MD On 11/17/2015.   Specialty:  Orthopedic Surgery   Why:  at 2:15pm   Contact information:   [address] [place] [address] [phone]       Follow up with [facility] .   Specialty:  [place] information:   [address] [place] [phone]       Signed: Prescott Parma, Sherren Mocha 10/15/2015, 6:37 AM

## 2015-10-13 NOTE — Progress Notes (Signed)
Patient is A&Ox4. HOH. Up to [facility] and chair with assist x1 and WW. C/o acid reflex, gave PRN med with relief. C/O pain with movement. Gave oral pain med with relief. Dressing is intact to right hip. Drain in place. LBM 6/27. K+ 3.2, gave oral supplement. PPP. Passing flatus.

## 2015-10-14 DIAGNOSIS — M1611 Unilateral primary osteoarthritis, right hip: Secondary | ICD-10-CM | POA: Diagnosis not present

## 2015-10-14 LAB — BASIC METABOLIC PANEL
Anion gap: 6 (ref 5–15)
BUN: 23 mg/dL — ABNORMAL HIGH (ref 6–20)
CO2: 29 mmol/L (ref 22–32)
Calcium: 8.2 mg/dL — ABNORMAL LOW (ref 8.9–10.3)
Chloride: 100 mmol/L — ABNORMAL LOW (ref 101–111)
Creatinine, Ser: 1.1 mg/dL (ref 0.61–1.24)
GFR calc Af Amer: >60 mL/min (ref >60)
GFR calc non Af Amer: >60 mL/min (ref >60)
Glucose, Bld: 132 mg/dL — ABNORMAL HIGH (ref 65–99)
Potassium: 3.4 mmol/L — ABNORMAL LOW (ref 3.5–5.1)
Sodium: 135 mmol/L (ref 135–145)

## 2015-10-14 LAB — CBC
HCT: 37.4 % — ABNORMAL LOW (ref 40.0–52.0)
Hemoglobin: 13.1 g/dL (ref 13.0–18.0)
MCH: 31.5 pg (ref 26.0–34.0)
MCHC: 35 g/dL (ref 32.0–36.0)
MCV: 90 fL (ref 80.0–100.0)
Platelets: 160 10*3/uL (ref 150–440)
RBC: 4.16 MIL/uL — ABNORMAL LOW (ref 4.40–5.90)
RDW: 13.3 % (ref 11.5–14.5)
WBC: 12.7 10*3/uL — ABNORMAL HIGH (ref 3.8–10.6)

## 2015-10-14 LAB — SURGICAL PATHOLOGY

## 2015-10-14 MED ORDER — LACTULOSE 10 GM/15ML PO SOLN: 20.0000 g | Freq: Two times a day (BID) | ORAL | Status: DC | PRN

## 2015-10-14 NOTE — Progress Notes (Signed)
Patient is A&O x4, HOH. Up with a strong assist x1 and WW. Drain removed this shift. Dressing to hip is intact. Urinating via urinal. Up to chair for meals, needs encouragement for activity. No C/O pain, no pain meds given. LBM this shift. PPP. CMTS, intact.

## 2015-10-14 NOTE — Progress Notes (Signed)
Physical Therapy Treatment Patient Details Name: Dennis Savage MRN: [id number] DOB: 08/20/1929 Today's Date: 10/14/2015    History of Present Illness Patient is a pleasant 82 y/o male that presents for R THR.     PT Comments    Pt presented in chair requesting to use BSC.  Due to urgency attempt to BR was not performed.  Pt demonstrated MinA sit to stand with cues for decreasing forward flexion to maintain precautions.  Pt ambulated 3ft to BSC.  After voiding pt demonstrated improved technique sit to stand from BSC.  Ambulated 8ft to bed and was minA for LE placement and sequencing for sit to supine transfer.  Pt able to scoot to HOB with UE and use of bed rails.  Pt then able to perform supine therex as noted in chair.  Pt left in bed with all current needs met.   Follow Up Recommendations  SNF     Equipment Recommendations  Rolling walker with 5" wheels    Recommendations for Other Services       Precautions / Restrictions Precautions Precautions: Posterior Hip;Fall Restrictions Weight Bearing Restrictions: Yes RLE Weight Bearing: Weight bearing as tolerated    Mobility  Bed Mobility Overal bed mobility: Needs Assistance Bed Mobility: Sit to Supine       Sit to supine: Min assist   General bed mobility comments: Assist for LE placement.  Pt able to scoot to HOB with min cues.    Transfers Overall transfer level: Needs assistance Equipment used: Rolling walker (2 wheeled) Transfers: Sit to/from Stand Sit to Stand: Min assist         General transfer comment: Cues for hand placement, min cues for maintaing precautions   Ambulation/Gait Ambulation/Gait assistance: Min guard Ambulation Distance (Feet): 8 Feet Assistive device: Rolling walker (2 wheeled) Gait Pattern/deviations: Step-to pattern;Decreased step length - right;Decreased step length - left;Decreased stance time - left;Antalgic     General Gait Details: Cues to increase step length and wt shifting ,  significant UE use noted.     Stairs            Wheelchair Mobility    Modified Rankin (Stroke Patients Only)       Balance Overall balance assessment: Needs assistance Sitting-balance support: Feet supported Sitting balance-Leahy Scale: Good     Standing balance support: Bilateral upper extremity supported Standing balance-Leahy Scale: Fair                      Cognition Arousal/Alertness: Awake/alert Behavior During Therapy: WFL for tasks assessed/performed Overall Cognitive Status: Within Functional Limits for tasks assessed                      Exercises Total Joint Exercises Ankle Circles/Pumps: AROM;20 reps;Supine Quad Sets: Strengthening;10 reps;Supine Gluteal Sets: Strengthening;Both;10 reps;Supine Towel Squeeze: Strengthening;Both;10 reps;Supine Heel Slides: AROM;Left;10 reps;Supine Hip ABduction/ADduction: AROM;Left;10 reps;Supine    General Comments        Pertinent Vitals/Pain Pain Assessment: 0-10 Pain Score: 4  Pain Location: R hip Pain Descriptors / Indicators: Aching;Operative site guarding Pain Intervention(s): Limited activity within patient's tolerance;Monitored during session    Home Living                      Prior Function            PT Goals (current goals can now be found in the care plan section) Acute Rehab PT Goals Patient Stated Goal: To get   stronger Progress towards PT goals: Progressing toward goals    Frequency  BID    PT Plan Current plan remains appropriate    Co-evaluation             End of Session Equipment Utilized During Treatment: Gait belt Activity Tolerance: Patient tolerated treatment well Patient left: in bed;with call bell/phone within reach;with bed alarm set     Time: 0910-0950 PT Time Calculation (min) (ACUTE ONLY): 40 min  Charges:  $Gait Training: 8-22 mins $Therapeutic Exercise: 8-22 mins $Therapeutic Activity: 8-22 mins                    G Codes:       Rosita DeChalus 10/14/2015, 9:59 AM Rosita DeChalus, PTA   

## 2015-10-14 NOTE — Progress Notes (Signed)
Occupational Therapy Treatment Patient Details Name: Dennis Savage MRN: [id number] DOB: July 05, 1929 Today's Date: 10/14/2015    History of present illness Patient is an 82 y/o male who was admitted for a  right THR.    OT comments  Pt. education was provided about A/E use for LE dressing, posterior hip precautions, and DME. Pt. Continues to present with pain, weakness, posterior hip precautions, and limited functional mobility during ADLS which continue to hinder his ability to complete ADL tasks independently. Pt. Continues to benefit from skilled OT services for ADL and A/E training, UE therapeutic ex., review of hip precautions, and functional mobility for ADLs in order to achieve independence and return to his PLOF.   Follow Up Recommendations  SNF    Equipment Recommendations       Recommendations for Other Services PT consult    Precautions / Restrictions                ADL   Eating/Feeding: Set up       Upper Body Bathing: Set up           Lower Body Dressing: Maximal assistance                 General ADL Comments: Pt. education was provided about A/E use for LE ADLs, and DME.      Vision                     Perception     Praxis      Cognition   Behavior During Therapy: WFL for tasks assessed/performed Overall Cognitive Status: Within Functional Limits for tasks assessed       Memory: Decreased recall of precautions               Extremity/Trunk Assessment               Exercises     Shoulder Instructions       General Comments      Pertinent Vitals/ Pain       Pain Assessment: 0-10 Pain Score: 4  Pain Location: Right Hip  Pain Descriptors / Indicators: Aching  Home Living Family/patient expects to be discharged to:: Skilled nursing facility Living Arrangements: Children                                      Prior Functioning/Environment Level of Independence: Independent with  assistive device(s)            Frequency Min 1X/week     Progress Toward Goals  OT Goals(current goals can now be found in the care plan section)        Plan      Co-evaluation                 End of Session     Activity Tolerance Patient tolerated treatment well   Patient Left in bed;with bed alarm set   Nurse Communication          Time: 0122-0137 OT Time Calculation (min): 15 min  Charges: OT Treatments $Self Care/Home Management : 8-22 mins   Harrel Carina, MS, OTR/L  Harrel Carina 10/14/2015, 2:57 PM

## 2015-10-14 NOTE — Progress Notes (Signed)
Dr Marry Guan requested that I discontinue the lactulose order if the pt had a BM today.  Pt had a BM so order was discontinued

## 2015-10-14 NOTE — Care Management Important Message (Signed)
Important Message  Patient Details  Name: Dennis Savage MRN: [id number] Date of Birth: [birth year]   Medicare Important Message Given:  Yes    [name] 10/14/2015, 11:08 AM

## 2015-10-14 NOTE — Progress Notes (Signed)
Physical Therapy Treatment Patient Details Name: Dennis Savage MRN: [id number] DOB: 27-Jun-1929 Today's Date: 10/14/2015    History of Present Illness Patient is an 82 y/o male who was admitted for a  right THR.     PT Comments    Pt presented in bed, supine to sit with minA for LE placement and truncal support.  Improved technique with sit to stand transfer.  Upon standing pt ambulated 40ft to [facility].  After voiding pt ambulated 65ft in room with cues to decreased UE pressure on RW.  Pt continues to demonstrate decreased step length with gait.  Encouraged pt to sit in chair and pt able to tolerate seated LE therex as noted.  Encouraged pt to sit in chair for min 30 and contact nsg when ready to return to bed.    Follow Up Recommendations  SNF     Equipment Recommendations  Rolling walker with 5" wheels    Recommendations for Other Services       Precautions / Restrictions Precautions Precautions: Posterior Hip;Fall Restrictions Weight Bearing Restrictions: Yes RLE Weight Bearing: Weight bearing as tolerated    Mobility  Bed Mobility Overal bed mobility: Needs Assistance Bed Mobility: Supine to Sit     Supine to sit: Min assist     General bed mobility comments: Min assist for LE placement and truncal support   Transfers Overall transfer level: Needs assistance Equipment used: Rolling walker (2 wheeled) Transfers: Sit to/from Stand Sit to Stand: Min guard         General transfer comment: improved techniqe in pm session   Ambulation/Gait Ambulation/Gait assistance: Min guard Ambulation Distance (Feet): 22 Feet Assistive device: Rolling walker (2 wheeled) Gait Pattern/deviations: Step-to pattern     General Gait Details: Cues to decrease heavy use of UE    Stairs            Wheelchair Mobility    Modified Rankin (Stroke Patients Only)       Balance Overall balance assessment: Needs assistance Sitting-balance support: Feet supported Sitting  balance-Leahy Scale: Good     Standing balance support: Bilateral upper extremity supported Standing balance-Leahy Scale: Fair                      Cognition Arousal/Alertness: Awake/alert Behavior During Therapy: WFL for tasks assessed/performed Overall Cognitive Status: Within Functional Limits for tasks assessed       Memory: Decreased recall of precautions              Exercises Total Joint Exercises Quad Sets: Strengthening;Both;10 reps Gluteal Sets: Strengthening;Both;10 reps Towel Squeeze: Strengthening;Both;10 reps Hip ABduction/ADduction: Strengthening;Both;10 reps Long Arc Quad: Strengthening;Both;10 reps    General Comments        Pertinent Vitals/Pain Pain Assessment: 0-10 Pain Score: 4  Pain Location: Right Hip  Pain Descriptors / Indicators: Aching Pain Intervention(s): Limited activity within patient's tolerance    Home Living Family/patient expects to be discharged to:: Skilled nursing facility Living Arrangements: Children                  Prior Function Level of Independence: Independent with assistive device(s)          PT Goals (current goals can now be found in the care plan section) Acute Rehab PT Goals Patient Stated Goal: To get stronger Progress towards PT goals: Progressing toward goals    Frequency  BID    PT Plan Current plan remains appropriate    Co-evaluation  End of Session Equipment Utilized During Treatment: Gait belt Activity Tolerance: Patient tolerated treatment well Patient left: in chair;with call bell/phone within reach;with chair alarm set     Time: 1335-1410 PT Time Calculation (min) (ACUTE ONLY): 35 min  Charges:  $Gait Training: 8-22 mins $Therapeutic Exercise: 8-22 mins                    G Codes:      Jayquan Bradsher 28-Oct-2015, 3:57 PM  Shakeisha Horine, PTA

## 2015-10-14 NOTE — Progress Notes (Addendum)
   Subjective: 2 Days Post-Op Procedure(s) (LRB): TOTAL HIP ARTHROPLASTY (Right) Patient reports pain as 1 on 0-10 scale.   Patient is well, and has had no acute complaints or problems We will continue therapy today.  Plan is to go Rehab after hospital stay. The plan is to go to [facility] on Saturday. no nausea and no vomiting Patient denies any chest pains or shortness of breath. The patient slept better last night.  Objective: Vital signs in last 24 hours: Temp:  [97.8 F (36.6 C)-98.7 F (37.1 C)] 98.7 F (37.1 C) (06/30 0442) Pulse Rate:  [64-93] 87 (06/30 0442) Resp:  [18] 18 (06/30 0442) BP: (129-133)/(70-75) 129/70 mmHg (06/30 0442) SpO2:  [93 %-98 %] 93 % (06/30 0442) well approximated incision Heels are non tender and elevated off the bed using rolled towels Intake/Output from previous day: 06/29 0701 - 06/30 0700 In: 240 [P.O.:240] Out: 425 [Urine:375; Drains:50] Intake/Output this shift: Total I/O In: -  Out: 50 [Drains:50]   Recent Labs  10/13/15 0327 10/14/15 0504  HGB 13.4 13.1    Recent Labs  10/13/15 0327 10/14/15 0504  WBC 13.6* 12.7*  RBC 4.26* 4.16*  HCT 38.1* 37.4*  PLT 175 160    Recent Labs  10/13/15 0327 10/14/15 0504  NA 134* 135  K 3.2* 3.4*  CL 101 100*  CO2 25 29  BUN 25* 23*  CREATININE 1.22 1.10  GLUCOSE 129* 132*  CALCIUM 8.4* 8.2*    Recent Labs  10/12/15 0621  INR 1.01    EXAM General - Patient is Alert, Appropriate and Oriented Extremity - Neurologically intact Neurovascular intact Sensation intact distally Intact pulses distally Dorsiflexion/Plantar flexion intact Compartment soft Dressing - scant drainage, with the Hemovac removed. Motor Function - intact, moving foot and toes well on exam.  The patient ambulated to the chair with physical therapy.  Past Medical History  Diagnosis Date  . Hypertension   . Atrial fibrillation ([place])   . Benign essential HTN 11/03/2010  . Peripheral vascular  disease ([place]) 11/03/2010  . Chronic obstructive pulmonary disease ([place]) 11/03/2010  . Interstitial lung disease ([place]) 10/13/2013  . Acute diarrhea 02/25/2015  . CN (constipation) 06/15/2011  . After cataract not obscuring vision 12/21/2011  . 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 Apnea, sleep 07/13/2015  . Arthralgia of multiple joints 06/15/2011  . Artificial lens present 12/08/2010  . 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 Dizziness 02/25/2015  . LBP (low back pain) 11/03/2010  . 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 Retinal hemorrhage 03/16/2014  . Heart murmur   . GERD (gastroesophageal reflux disease)   . Arthritis     Assessment/Plan: 2 Days Post-Op Procedure(s) (LRB): TOTAL HIP ARTHROPLASTY (Right) Active Problems:   S/P total hip arthroplasty  Estimated body mass index is 31.29 kg/(m^2) as calculated from the following:   Height as of this encounter: 5\' 9"  (1.753 m).   Weight as of this encounter: 96.163 kg (212 lb). Continue increasing his diet. Plan to discharge to rehabilitation Saturday. Continue physical therapy.  Labs: K+ 3.4 continue klor con DVT Prophylaxis - Xarelto, Foot Pumps and TED hose Weight-Bearing as tolerated to right leg D/C O2 and Pulse OX and try on Room Air Continue working on a bowel movement Labs tomorrow am  Dennis Dixon PA-C [place] 10/14/2015, 6:13 AM

## 2015-10-14 NOTE — Discharge Instructions (Signed)
POSTERIOR TOTAL HIP REPLACEMENT POSTOPERATIVE DIRECTIONS ° °Hip Rehabilitation, Guidelines Following Surgery  °The results of a hip operation are greatly improved after range of motion and muscle strengthening exercises. Follow all safety measures which are given to protect your hip. If any of these exercises cause increased pain or swelling in your joint, decrease the amount until you are comfortable again. Then slowly increase the exercises. Call your caregiver if you have problems or questions.  ° °HOME CARE INSTRUCTIONS  °Remove items at home which could result in a fall. This includes throw rugs or furniture in walking pathways.  °· ICE to the affected hip every three hours for 30 minutes at a time and then as needed for pain and swelling.  Continue to use ice on the hip for pain and swelling from surgery. You may notice swelling that will progress down to the foot and ankle.  This is normal after surgery.  Elevate the leg when you are not up walking on it.   °· Continue to use the breathing machine which will help keep your temperature down.  It is common for your temperature to cycle up and down following surgery, especially at night when you are not up moving around and exerting yourself.  The breathing machine keeps your lungs expanded and your temperature down. ° °DIET °You may resume your previous home diet once your are discharged from the hospital. ° °DRESSING / WOUND CARE / SHOWERING °Keep the surgical dressing until follow up.  The dressing is water proof, so you can shower without any extra covering.  IF THE DRESSING FALLS OFF or the wound gets wet inside, change the dressing with sterile gauze.  Please use good hand washing techniques before changing the dressing.  Do not use any lotions or creams on the incision until instructed by your surgeon.   °You need to keep your dressing dry after discharge.   °Change the surgical dressing if needed with Physical Therapy and reapply a dry dressing each  time. ° ° ° °ACTIVITY °Walk with your walker as instructed. °Use walker as long as suggested by your caregivers. °Avoid periods of inactivity such as sitting longer than an hour when not asleep. This helps prevent blood clots.  °You may resume a sexual relationship in one month or when given the OK by your doctor.  °You may return to work once you are cleared by your doctor.  °Do not drive a car for 6 weeks or until released by you surgeon.  °Do not drive while taking narcotics. ° °WEIGHT BEARING °Weight bearing as tolerated with assist device (walker, cane, etc) as directed, use it as long as suggested by your surgeon or therapist, typically at least 4-6 weeks. ° °POSTOPERATIVE CONSTIPATION PROTOCOL °Constipation - defined medically as fewer than three stools per week and severe constipation as less than one stool per week. ° °One of the most common issues patients have following surgery is constipation.  Even if you have a regular bowel pattern at home, your normal regimen is likely to be disrupted due to multiple reasons following surgery.  Combination of anesthesia, postoperative narcotics, change in appetite and fluid intake all can affect your bowels.  In order to avoid complications following surgery, here are some recommendations in order to help you during your recovery period. ° °Colace (docusate) - Pick up an over-the-counter form of Colace or another stool softener and take twice a day as long as you are requiring postoperative pain medications.  Take with a   full glass of water daily.  If you experience loose stools or diarrhea, hold the colace until you stool forms back up.  If your symptoms do not get better within 1 week or if they get worse, check with your doctor. ° °Dulcolax (bisacodyl) - Pick up over-the-counter and take as directed by the product packaging as needed to assist with the movement of your bowels.  Take with a full glass of water.  Use this product as needed if not relieved by Colace  only.  ° °MiraLax (polyethylene glycol) - Pick up over-the-counter to have on hand.  MiraLax is a solution that will increase the amount of water in your bowels to assist with bowel movements.  Take as directed and can mix with a glass of water, juice, soda, coffee, or tea.  Take if you go more than two days without a movement. °Do not use MiraLax more than once per day. Call your doctor if you are still constipated or irregular after using this medication for 7 days in a row. ° °If you continue to have problems with postoperative constipation, please contact the office for further assistance and recommendations.  If you experience "the worst abdominal pain ever" or develop nausea or vomiting, please contact the office immediatly for further recommendations for treatment. ° °ITCHING ° If you experience itching with your medications, try taking only a single pain pill, or even half a pain pill at a time.  You can also use Benadryl over the counter for itching or also to help with sleep.  ° °TED HOSE STOCKINGS °Wear the elastic stockings on both legs for three weeks following surgery during the day but you may remove then at night for sleeping. ° °MEDICATIONS °See your medication summary on the “After Visit Summary” that the nursing staff will review with you prior to discharge.  You may have some home medications which will be placed on hold until you complete the course of blood thinner medication.  It is important for you to complete the blood thinner medication as prescribed by your surgeon.  Continue your approved medications as instructed at time of discharge. ° °PRECAUTIONS °If you experience chest pain or shortness of breath - call 911 immediately for transfer to the hospital emergency department.  °If you develop a fever greater that 101 F, purulent drainage from wound, increased redness or drainage from wound, foul odor from the wound/dressing, or calf pain - CONTACT YOUR SURGEON.   °                                                 °FOLLOW-UP APPOINTMENTS °Make sure you keep all of your appointments after your operation with your surgeon and caregivers. You should call the office at the above phone number and make an appointment for approximately two weeks after the date of your surgery or on the date instructed by your surgeon outlined in the "After Visit Summary". ° °RANGE OF MOTION AND STRENGTHENING EXERCISES  °These exercises are designed to help you keep full movement of your hip joint. Follow your caregiver's or physical therapist's instructions. Perform all exercises about fifteen times, three times per day or as directed. Exercise both hips, even if you have had only one joint replacement. These exercises can be done on a training (exercise) mat, on the floor, on a table or on a   bed. Use whatever works the best and is most comfortable for you. Use music or television while you are exercising so that the exercises are a pleasant break in your day. This will make your life better with the exercises acting as a break in routine you can look forward to.  °Lying on your back, slowly slide your foot toward your buttocks, raising your knee up off the floor. Then slowly slide your foot back down until your leg is straight again.  °Lying on your back spread your legs as far apart as you can without causing discomfort.  °Lying on your side, raise your upper leg and foot straight up from the floor as far as is comfortable. Slowly lower the leg and repeat.  °Lying on your back, tighten up the muscle in the front of your thigh (quadriceps muscles). You can do this by keeping your leg straight and trying to raise your heel off the floor. This helps strengthen the largest muscle supporting your knee.  °Lying on your back, tighten up the muscles of your buttocks both with the legs straight and with the knee bent at a comfortable angle while keeping your heel on the floor.  ° ° ° ° °IF YOU ARE TRANSFERRED TO A SKILLED REHAB  FACILITY °If the patient is transferred to a skilled rehab facility following release from the hospital, a list of the current medications will be sent to the facility for the patient to continue.  When discharged from the skilled rehab facility, please have the facility set up the patient's Home Health Physical Therapy prior to being released. Also, the skilled facility will be responsible for providing the patient with their medications at time of release from the facility to include their pain medication, the muscle relaxants, and their blood thinner medication. If the patient is still at the rehab facility at time of the two week follow up appointment, the skilled rehab facility will also need to assist the patient in arranging follow up appointment in our office and any transportation needs. ° °MAKE SURE YOU:  °Understand these instructions.  °Get help right away if you are not doing well or get worse.  ° ° °Pick up stool softner and laxative for home use following surgery while on pain medications. °Do not submerge incision under water. °Please use good hand washing techniques while changing dressing each day. °May shower starting three days after surgery. °Please use a clean towel to pat the incision dry following showers. °Continue to use ice for pain and swelling after surgery. °Do not use any lotions or creams on the incision until instructed by your surgeon. °

## 2015-10-15 NOTE — Progress Notes (Signed)
CSW was informed that patient will not discharge today. CSW informed Broadus John- Admissions Coordinator at [facility]. Patient may discharge tomorrow 7/2. CSW will continue to follow and assist.  Ernest Pine, MSW, [place], [place] Social Worker [phone]

## 2015-10-15 NOTE — Progress Notes (Signed)
Patient states he still does not feel good and feels like he may not be able to discharge today. Dr Roland Rack paged. Waiting on callback.

## 2015-10-15 NOTE — Progress Notes (Signed)
Dr Roland Rack called back, will keep patient one more night.

## 2015-10-15 NOTE — Progress Notes (Signed)
Physical Therapy Treatment Patient Details Name: Dennis Savage MRN: [id number] DOB: 12-02-1929 Today's Date: 10/15/2015    History of Present Illness Patient is an 82 y/o male who was admitted for a  right THR.     PT Comments    Pt did not discharge today as planned and is eager to work with PT this afternoon.  He reports essentially no pain in the hip at rest and shows good effort t/o the session.  He is nearly able to get to sitting w/o assist and does very well with getting to standing and walking 2X as far as he had in any previous PT session.  Pt able to recall 3/3 hip precautions and shows great effort with exercises.    Follow Up Recommendations  SNF     Equipment Recommendations       Recommendations for Other Services       Precautions / Restrictions Precautions Precautions: Posterior Hip;Fall Restrictions RLE Weight Bearing: Weight bearing as tolerated    Mobility  Bed Mobility Overal bed mobility: Needs Assistance Bed Mobility: Supine to Sit;Sit to Supine     Supine to sit: Min assist Sit to supine: Min assist   General bed mobility comments: Pt did very well getting to/from supine and needs only very light assist and cuing.  Heavy reliance on the rails, labored once up to sitting.  Transfers Overall transfer level: Modified independent Equipment used: Rolling walker (2 wheeled) Transfers: Sit to/from Stand Sit to Stand: Min guard         General transfer comment: Pt needs cues for hand placement and positioning, but pt is able to get up to standing w/o direct assist.  Ambulation/Gait Ambulation/Gait assistance: Min assist Ambulation Distance (Feet): 55 Feet Assistive device: Rolling walker (2 wheeled)       General Gait Details: Pt is slow and deliberate with ambulation, but actually is able to walk much farther than he has yet.  Pt fatigued with the effort, but overall shows good improvement with slow, safe ambulation.    Stairs             Wheelchair Mobility    Modified Rankin (Stroke Patients Only)       Balance                                    Cognition Arousal/Alertness: Awake/alert Behavior During Therapy: WFL for tasks assessed/performed Overall Cognitive Status: Within Functional Limits for tasks assessed                      Exercises Total Joint Exercises Ankle Circles/Pumps: 10 reps;Strengthening Quad Sets: 20 reps;Strengthening Gluteal Sets: Strengthening;20 reps Short Arc Quad: 20 reps;Strengthening Heel Slides: 10 reps;AROM;Strengthening Hip ABduction/ADduction: 10 reps;Strengthening    General Comments        Pertinent Vitals/Pain Pain Score:  (almost no hip pain at rest, stomach 5/10)    Home Living                      Prior Function            PT Goals (current goals can now be found in the care plan section) Progress towards PT goals: Progressing toward goals    Frequency  BID    PT Plan Current plan remains appropriate    Co-evaluation  End of Session Equipment Utilized During Treatment: Gait belt Activity Tolerance: Patient tolerated treatment well Patient left: with call bell/phone within reach;with bed alarm set     Time: [id number] PT Time Calculation (min) (ACUTE ONLY): 25 min  Charges:  $Gait Training: 8-22 mins $Therapeutic Exercise: 8-22 mins                    G Codes:      Kreg Shropshire, DPT 10/15/2015, 4:20 PM

## 2015-10-15 NOTE — Progress Notes (Signed)
OT Cancellation Note  Patient Details Name: FRIEND VANDERPOEL MRN: [id number] DOB: [birth year]   Cancelled Treatment:    Reason Eval/Treat Not Completed: Other (comment) (Patient declined, due to nausea. Also states he does not need AE, and does not want OT at this time)  Amie [place], OTR/L Emanuella Nickle L 10/15/2015, 12:44 PM

## 2015-10-15 NOTE — Progress Notes (Signed)
DR POGGI  ON ROUNDS. PT DISTENDED . MD ORDERS  DULCOLAX SUPP  10MG  PR ORDERED FOLLOWED BY ENEMA IN 1 HR

## 2015-10-15 NOTE — Progress Notes (Signed)
   Subjective: 3 Days Post-Op Procedure(s) (LRB): TOTAL HIP ARTHROPLASTY (Right) Patient reports pain as 1 on 0-10 scale.   Patient is well, and has had no acute complaints or problems We will continue therapy today. He ambulated 22 feet yesterday. Plan is to go Rehab after hospital stay. The plan [facility]. no nausea and no vomiting Patient denies any chest pains or shortness of breath. The patient slept better last night. The patient had a bowel movement yesterday.  Objective: Vital signs in last 24 hours: Temp:  [98.1 F (36.7 C)-99.6 F (37.6 C)] 98.5 F (36.9 C) (07/01 0430) Pulse Rate:  [70-82] 82 (07/01 0430) Resp:  [18-22] 22 (07/01 0430) BP: (123-129)/(67-72) 129/72 mmHg (07/01 0430) SpO2:  [92 %-96 %] 94 % (07/01 0430) well approximated incision Heels are non tender and elevated off the bed using rolled towels Intake/Output from previous day: 06/30 0701 - 07/01 0700 In: -  Out: 900 [Urine:900] Intake/Output this shift: Total I/O In: -  Out: 900 [Urine:900]   Recent Labs  10/13/15 0327 10/14/15 0504  HGB 13.4 13.1    Recent Labs  10/13/15 0327 10/14/15 0504  WBC 13.6* 12.7*  RBC 4.26* 4.16*  HCT 38.1* 37.4*  PLT 175 160    Recent Labs  10/13/15 0327 10/14/15 0504  NA 134* 135  K 3.2* 3.4*  CL 101 100*  CO2 25 29  BUN 25* 23*  CREATININE 1.22 1.10  GLUCOSE 129* 132*  CALCIUM 8.4* 8.2*   No results for input(s): LABPT, INR in the last 72 hours.  EXAM General - Patient is Alert, Appropriate and Oriented Extremity - Neurologically intact Neurovascular intact Sensation intact distally Intact pulses distally Dorsiflexion/Plantar flexion intact Compartment soft Dressing - scant drainage, Motor Function - intact, moving foot and toes well on exam.  The patient ambulated 22 feet with physical therapy.  Past Medical History  Diagnosis Date  . Hypertension   . Atrial fibrillation ([place])   . Benign essential HTN  11/03/2010  . Peripheral vascular disease ([place]) 11/03/2010  . Chronic obstructive pulmonary disease ([place]) 11/03/2010  . Interstitial lung disease ([place]) 10/13/2013  . Acute diarrhea 02/25/2015  . CN (constipation) 06/15/2011  . After cataract not obscuring vision 12/21/2011  . 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 Apnea, sleep 07/13/2015  . Arthralgia of multiple joints 06/15/2011  . Artificial lens present 12/08/2010  . 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 Dizziness 02/25/2015  . LBP (low back pain) 11/03/2010  . 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 Retinal hemorrhage 03/16/2014  . Heart murmur   . GERD (gastroesophageal reflux disease)   . Arthritis     Assessment/Plan: 3 Days Post-Op Procedure(s) (LRB): TOTAL HIP ARTHROPLASTY (Right) Active Problems:   S/P total hip arthroplasty  Estimated body mass index is 31.29 kg/(m^2) as calculated from the following:   Height as of this encounter: 5\' 9"  (1.753 m).   Weight as of this encounter: 96.163 kg (212 lb). Continue increasing his diet. Plan to discharge to rehabilitation Today. Continue physical therapy.  Labs: K+ 3.4 continue klor con DVT Prophylaxis - Xarelto, Foot Pumps and TED hose Weight-Bearing as tolerated to right leg Plan to discharge to rehabilitation today.  Reche Dixon [facility] 10/15/2015, 6:33 AM

## 2015-10-15 NOTE — Progress Notes (Signed)
Pt refusing foot pumps through out shift.

## 2015-10-15 NOTE — Progress Notes (Signed)
Physical Therapy Treatment Patient Details Name: SCOUT VIENS MRN: [id number] DOB: 28-Feb-1930 Today's Date: 10/15/2015    History of Present Illness Patient is an 82 y/o male who was admitted for a  right THR.     PT Comments    Patient reports having stomach discomfort and declines all mobility due to stomach pain but agrees to perform bed exercises today. Patient tolerates exercises well.  Follow Up Recommendations  SNF     Equipment Recommendations  Rolling walker with 5" wheels    Recommendations for Other Services       Precautions / Restrictions Precautions Precautions: Posterior Hip;Fall Restrictions Weight Bearing Restrictions: Yes RLE Weight Bearing: Weight bearing as tolerated    Mobility  Bed Mobility Overal bed mobility: Needs Assistance Bed Mobility:  (Patient declined)     Supine to sit:  (patient declined)        Transfers Overall transfer level:  (Patient declined)                  Ambulation/Gait Ambulation/Gait assistance:  (Patient declined)               Financial trader Rankin (Stroke Patients Only)       Balance                                    Cognition Arousal/Alertness: Awake/alert Behavior During Therapy: WFL for tasks assessed/performed Overall Cognitive Status: Within Functional Limits for tasks assessed       Memory: Decreased recall of precautions              Exercises Total Joint Exercises Ankle Circles/Pumps: AROM;20 reps;Supine Quad Sets: Strengthening;Both;10 reps Gluteal Sets: Strengthening;Both;10 reps Towel Squeeze: Strengthening;Both;10 reps Short Arc Quad: AROM;Strengthening;Both;10 reps;Supine Heel Slides: AROM;Left;10 reps;Supine Hip ABduction/ADduction: Strengthening;Both;10 reps Straight Leg Raises: AAROM;Both;10 reps;Supine Long Arc Quad: Strengthening;Both;10 reps    General Comments        Pertinent Vitals/Pain  Pain Assessment: 0-10 Pain Score: 5  Pain Location: stomach and right hip Pain Descriptors / Indicators: Aching Pain Intervention(s): Limited activity within patient's tolerance    Home Living                      Prior Function            PT Goals (current goals can now be found in the care plan section) Acute Rehab PT Goals Patient Stated Goal: To get stronger PT Goal Formulation: With patient Time For Goal Achievement: 10/26/15 Potential to Achieve Goals: Good Progress towards PT goals: Progressing toward goals    Frequency  BID    PT Plan Current plan remains appropriate    Co-evaluation             End of Session   Activity Tolerance: Patient tolerated treatment well       Time: [id number] PT Time Calculation (min) (ACUTE ONLY): 25 min  Charges:  $Therapeutic Exercise: 23-37 mins                    G Codes:     Alanson Puls, PT, DPT [place], [place] S 10/15/2015, 9:14 AM

## 2015-10-15 NOTE — Progress Notes (Signed)
CSW informed by RN that patient's stomach is distended. Awaiting results from Dulcolax and Enema to determine if patient will discharge to [facility] today. CSW will continue to follow and assist.  Ernest Pine, MSW, [place], [place] Social Worker [phone]

## 2015-10-16 NOTE — Progress Notes (Signed)
Report called to Kim at [facility]. EMS called. Waiting on transport.

## 2015-10-16 NOTE — Progress Notes (Signed)
Plan is for patient to discharge to [facility] today.

## 2015-10-16 NOTE — Progress Notes (Signed)
   Subjective: 4 Days Post-Op Procedure(s) (LRB): TOTAL HIP ARTHROPLASTY (Right) Patient reports pain as 1 on 0-10 scale.   Patient is well, and has had no acute complaints or problems. The patient had abdominal distention yesterday and nausea. He has had several bowel movements since receiving an enema. He is feeling much better now. We will continue therapy today. He ambulated 55 feet yesterday. Plan is to go Rehab after hospital stay. The plan [facility]. no nausea and no vomiting Patient denies any chest pains or shortness of breath. The patient slept better last night. The patient had a bowel movement yesterday.  Objective: Vital signs in last 24 hours: Temp:  [97.5 F (36.4 C)-98.7 F (37.1 C)] 97.9 F (36.6 C) (07/02 0445) Pulse Rate:  [77-87] 82 (07/02 0445) Resp:  [16-20] 20 (07/02 0445) BP: (125-128)/(65-75) 125/75 mmHg (07/02 0445) SpO2:  [92 %-96 %] 92 % (07/02 0445) well approximated incision Heels are non tender and elevated off the bed using rolled towels Intake/Output from previous day: 07/01 0701 - 07/02 0700 In: 240 [P.O.:240] Out: 500 [Urine:500] Intake/Output this shift: Total I/O In: -  Out: 500 [Urine:500]   Recent Labs  10/14/15 0504  HGB 13.1    Recent Labs  10/14/15 0504  WBC 12.7*  RBC 4.16*  HCT 37.4*  PLT 160    Recent Labs  10/14/15 0504  NA 135  K 3.4*  CL 100*  CO2 29  BUN 23*  CREATININE 1.10  GLUCOSE 132*  CALCIUM 8.2*   No results for input(s): LABPT, INR in the last 72 hours.  EXAM General - Patient is Alert, Appropriate and Oriented Extremity - Neurologically intact Neurovascular intact Sensation intact distally Intact pulses distally Dorsiflexion/Plantar flexion intact Compartment soft Dressing - scant drainage, Motor Function - intact, moving foot and toes well on exam.  The patient ambulated 55 feet with physical therapy.  Past Medical History  Diagnosis Date  . Hypertension   .  Atrial fibrillation ([place])   . Benign essential HTN 11/03/2010  . Peripheral vascular disease ([place]) 11/03/2010  . Chronic obstructive pulmonary disease ([place]) 11/03/2010  . Interstitial lung disease ([place]) 10/13/2013  . Acute diarrhea 02/25/2015  . CN (constipation) 06/15/2011  . After cataract not obscuring vision 12/21/2011  . 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 Apnea, sleep 07/13/2015  . Arthralgia of multiple joints 06/15/2011  . Artificial lens present 12/08/2010  . 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 Dizziness 02/25/2015  . LBP (low back pain) 11/03/2010  . 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 Retinal hemorrhage 03/16/2014  . Heart murmur   . GERD (gastroesophageal reflux disease)   . Arthritis     Assessment/Plan: 4 Days Post-Op Procedure(s) (LRB): TOTAL HIP ARTHROPLASTY (Right) Active Problems:   S/P total hip arthroplasty  Estimated body mass index is 31.29 kg/(m^2) as calculated from the following:   Height as of this encounter: 5\' 9"  (1.753 m).   Weight as of this encounter: 96.163 kg (212 lb). Continue increasing his diet. Plan to discharge to rehabilitation Today. Continue physical therapy.  DVT Prophylaxis - Xarelto, Foot Pumps and TED hose Weight-Bearing as tolerated to right leg Plan to discharge to rehabilitation today.  Reche Dixon PA-C [place] 10/16/2015, 6:18 AM

## 2015-10-16 NOTE — Clinical Social Work Placement (Signed)
   CLINICAL SOCIAL WORK PLACEMENT  NOTE  Date:  10/16/2015  Patient Details  Name: Dennis Savage MRN: [id number] Date of Birth: 07/10/1929  Clinical Social Work is seeking post-discharge placement for this patient at the [place] level of care (*CSW will initial, date and re-position this form in  chart as items are completed):  Yes   Patient/family provided with [place] Work Department's list of facilities offering this level of care within the geographic area requested by the patient (or if unable, by the patient's family).  Yes   Patient/family informed of their freedom to choose among providers that offer the needed level of care, that participate in Medicare, Medicaid or managed care program needed by the patient, have an available bed and are willing to accept the patient.  Yes   Patient/family informed of [place]'s ownership interest in [facility] and [facility], as well as of the fact that they are under no obligation to receive care at these facilities.  PASRR submitted to EDS on       PASRR number received on       Existing PASRR number confirmed on 10/13/15     FL2 transmitted to all facilities in geographic area requested by pt/family on 10/13/15     FL2 transmitted to all facilities within larger geographic area on       Patient informed that his/her managed care company has contracts with or will negotiate with certain facilities, including the following:        Yes   Patient/family informed of bed offers received.  Patient chooses bed at  ([facility] )     Physician recommends and patient chooses bed at      Patient to be transferred to  ([facility] ) on 10/16/15.  Patient to be transferred to facility by  [facility] )     Patient family notified on 10/16/15 of transfer.  Name of family member notified:   (Beatrice left patient's daughter Pamala Hurry a Advertising account executive )     PHYSICIAN       Additional Comment:     _______________________________________________ Loralyn Freshwater, LCSW 10/16/2015, 9:15 AM

## 2015-10-16 NOTE — Progress Notes (Signed)
Patient is medically stable for D/C to [facility] today. Per [facility] liaison patient will go to room 701. RN called reported and arranged EMS for transport. Clinical Education officer, museum (CSW) sent D/C orders to [facility] via [facility]. Patient is aware of above. CSW left patient's daughter Pamala Hurry a Advertising account executive. Please reconsult if future social work needs arise. CSW signing off.   Blima Rich, LCSW [phone]

## 2015-11-10 ENCOUNTER — Telehealth: Payer: Self-pay | Admitting: Urology

## 2015-11-10 ENCOUNTER — Ambulatory Visit
Admission: RE | Admit: 2015-11-10 | Discharge: 2015-11-10 | Disposition: A | Discharge status: Home / Self Care | Payer: Medicare Other | Source: Ambulatory Visit | Attending: Specialist | Admitting: Specialist

## 2015-11-10 DIAGNOSIS — I2510 Atherosclerotic heart disease of native coronary artery without angina pectoris: Secondary | ICD-10-CM | POA: Diagnosis not present

## 2015-11-10 DIAGNOSIS — J61 Pneumoconiosis due to asbestos and other mineral fibers: Secondary | ICD-10-CM | POA: Diagnosis not present

## 2015-11-10 DIAGNOSIS — R911 Solitary pulmonary nodule: Secondary | ICD-10-CM

## 2015-11-10 DIAGNOSIS — I700 Atherosclerosis of aorta: Secondary | ICD-10-CM | POA: Diagnosis not present

## 2015-11-10 NOTE — Telephone Encounter (Signed)
Please call me about this so I can explain what happened.   Sharyn Lull

## 2015-11-11 ENCOUNTER — Ambulatory Visit
Admission: RE | Admit: 2015-11-11 | Discharge: 2015-11-11 | Disposition: A | Discharge status: Home / Self Care | Payer: Medicare Other | Source: Ambulatory Visit | Attending: Urology | Admitting: Urology

## 2015-11-11 ENCOUNTER — Other Ambulatory Visit: Payer: Self-pay | Admitting: Urology

## 2015-11-11 DIAGNOSIS — N281 Cyst of kidney, acquired: Secondary | ICD-10-CM

## 2015-12-02 ENCOUNTER — Other Ambulatory Visit: Payer: Self-pay | Admitting: Specialist

## 2015-12-02 DIAGNOSIS — J849 Interstitial pulmonary disease, unspecified: Secondary | ICD-10-CM

## 2016-03-14 ENCOUNTER — Ambulatory Visit
Admission: RE | Admit: 2016-03-14 | Discharge: 2016-03-14 | Disposition: A | Discharge status: Home / Self Care | Payer: Medicare Other | Source: Ambulatory Visit | Attending: Urology | Admitting: Urology

## 2016-03-14 DIAGNOSIS — K760 Fatty (change of) liver, not elsewhere classified: Secondary | ICD-10-CM | POA: Insufficient documentation

## 2016-03-14 DIAGNOSIS — N281 Cyst of kidney, acquired: Secondary | ICD-10-CM | POA: Insufficient documentation

## 2016-03-14 DIAGNOSIS — N2889 Other specified disorders of kidney and ureter: Secondary | ICD-10-CM | POA: Diagnosis present

## 2016-03-14 DIAGNOSIS — D3501 Benign neoplasm of right adrenal gland: Secondary | ICD-10-CM | POA: Insufficient documentation

## 2016-03-14 MED ORDER — GADOBENATE DIMEGLUMINE 529 MG/ML IV SOLN
20.0000 mL | Freq: Once | INTRAVENOUS | Status: AC | PRN
  Administered 2016-03-14: 20 mL via INTRAVENOUS

## 2016-03-21 ENCOUNTER — Ambulatory Visit: Payer: Medicare Other | Admitting: Urology

## 2016-03-21 ENCOUNTER — Encounter: Payer: Self-pay | Admitting: Urology

## 2016-03-21 ENCOUNTER — Ambulatory Visit (INDEPENDENT_AMBULATORY_CARE_PROVIDER_SITE_OTHER): Payer: Medicare Other | Admitting: Urology

## 2016-03-21 VITALS — BP 138/68 | HR 84 | Ht 69.0 in | Wt 211.0 lb

## 2016-03-21 DIAGNOSIS — N200 Calculus of kidney: Secondary | ICD-10-CM

## 2016-03-21 DIAGNOSIS — E279 Disorder of adrenal gland, unspecified: Secondary | ICD-10-CM

## 2016-03-21 DIAGNOSIS — E278 Other specified disorders of adrenal gland: Secondary | ICD-10-CM

## 2016-03-21 DIAGNOSIS — N281 Cyst of kidney, acquired: Secondary | ICD-10-CM | POA: Diagnosis not present

## 2016-03-22 ENCOUNTER — Ambulatory Visit: Payer: Medicare Other | Admitting: Urology

## 2016-03-24 NOTE — Progress Notes (Signed)
1:50 PM  03/21/16  Dennis Savage 1929/08/01 [id number]  Referring provider: [name], MD [place] [facility] [place], [place] [address]  Chief Complaint  Patient presents with  . Results    32months w/MRI    HPI: 82 yo M Who returned today for follow-up of multiple GI issues including:  Nocturia/ LUTS Patient reports that he gets up 3x nightly, improved   No significant daytime urgency, frequency, or urge incontinence.  He does have a weak stream and sits to void.  He does sometimes double void and does not feel that he is able to completely emtpy at times.    He does have untreated OSA.  He was dx 15 years ago but was unable to tolerate CPAP. He denies leg swelling. He is currently on a diuretic.   Urinary symptoms not addressed today.  History of bladder lesion He also has a history of ? Transurethral surgery for a bladder lesion by [name] ~5 years ago.  He does not think that he has a  history of bladder cancer. Pathology and surgical dictation unavailable. Unable to locate records.  Renal cyst   Patient was told he has renal cysts by his nephrologist (Dr.Singh approximately one year ago). 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 In addition, it appears that there was a hyperattenuating/ hyper enhancing exophytic lesion on the lower pole of the right kidney measuring 1.9 cm and other complex cysts on 02/2015.  Repeat CT abd/ pelvis with and without contrast (dedicated renal mass protocol) on 08/24/2015, the indeterminate left lower pole lesion has remained stable in size 19 mm and has equivocal/ low level enhancement.    Follow-up MRI abdomen with and without contrast on 03/14/2016 shows a 2.3 hemorrhagic left renal cyst as well as a 1.6 cm  left hemorrhagic renal cyst. There is no enhancing renal masses identified. He has had interval hemorrhage into the left adrenal adenoma measuring 2.2 cm overall unchanged from 5/17.  Right adrenal adenoma 2.2 cm also stable in size.  Flank pain or gross hematuria.  Nephrolithiasis Incidental 4 mm left upper pole stone on CT scan. Asymptomatic.   PMH: Past Medical History:  Diagnosis Date  . Acute diarrhea 02/25/2015  . After cataract not obscuring vision 12/21/2011  . 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 Apnea, sleep 07/13/2015  . Arthralgia of multiple joints 06/15/2011  . Arthritis   . Artificial lens present 12/08/2010  . Atrial fibrillation ([place])   . Benign essential HTN 11/03/2010  . Chronic obstructive pulmonary disease ([place]) 11/03/2010  . CN (constipation) 06/15/2011  . 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 Dizziness 02/25/2015  . GERD (gastroesophageal reflux disease)   . Heart murmur   . Hypertension   . Interstitial lung disease ([place]) 10/13/2013  . LBP (low back pain) 11/03/2010  . 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 Peripheral vascular disease ([place]) 11/03/2010  . Retinal hemorrhage 03/16/2014    Surgical History: Past Surgical History:  Procedure Laterality Date  . APPENDECTOMY    . BACK SURGERY    . CARPAL TUNNEL RELEASE Bilateral   . EYE SURGERY Bilateral    Cataract Extraction with IOL  . HERNIA REPAIR     Umbilical Hernia X 3  . JOINT REPLACEMENT  bilat knees  . NASAL SINUS SURGERY    . REPLACEMENT TOTAL KNEE Bilateral   . SHOULDER SURGERY Right   . TONSILLECTOMY    . TOTAL HIP ARTHROPLASTY Right 10/12/2015   Procedure: TOTAL HIP ARTHROPLASTY;  Surgeon: Dereck Leep, MD;  Location: [facility] ORS;  Service: Orthopedics;  Laterality: Right;    Home Medications:    Medication List       Accurate as of 03/21/16 11:59 PM. Always use your most recent med list.          acetaminophen 325 MG tablet Commonly known as:  TYLENOL Take 650 mg by mouth every 6 (six)  hours as needed.   amLODipine 10 MG tablet Commonly known as:  NORVASC Take 10 mg by mouth daily.   chlorthalidone 25 MG tablet Commonly known as:  HYGROTON Take 25 mg by mouth daily as needed.   docusate sodium 250 MG capsule Commonly known as:  COLACE Take 250 mg by mouth daily as needed for constipation.   oxyCODONE 5 MG immediate release tablet Commonly known as:  Oxy IR/ROXICODONE Take 1-2 tablets (5-10 mg total) by mouth every 4 (four) hours as needed for severe pain.   PRESERVISION AREDS 2 Caps Take 2 capsules by mouth daily.   tamsulosin 0.4 MG Caps capsule Commonly known as:  FLOMAX Take 1 capsule (0.4 mg total) by mouth daily.   traMADol 50 MG tablet Commonly known as:  ULTRAM Take 1-2 tablets (50-100 mg total) by mouth every 4 (four) hours as needed for moderate pain.   XARELTO 20 MG Tabs tablet Generic drug:  rivaroxaban Take 20 mg by mouth daily with supper.       Allergies:  Allergies  Allergen Reactions  . Penicillins Hives and Swelling    Has patient had a PCN reaction causing immediate rash, facial/tongue/throat swelling, SOB or lightheadedness with hypotension: yES Has patient had a PCN reaction causing severe rash involving mucus membranes or skin necrosis: UNKNOWN Has patient had a PCN reaction that required hospitalization NO Has patient had a PCN reaction occurring within the last 10 years: nO If all of the above answers are "NO", then may proceed with Cephalosporin use.    Family History: Family History  Problem Relation Age of Onset  . Bladder Cancer Neg Hx   . Kidney cancer Neg Hx   . Prostate cancer Neg Hx     Social History:  reports that he has quit smoking. His smoking use included Cigarettes. He smoked 1.00 pack per day. He has never used smokeless tobacco. He reports that he does not drink alcohol or use drugs.  ROS: UROLOGY Frequent Urination?: No Hard to postpone urination?: No Burning/pain with urination?: No Get up at  night to urinate?: No Leakage of urine?: No Urine stream starts and stops?: No Trouble starting stream?: No Do you have to strain to urinate?: No Blood in urine?: No Urinary tract infection?: No Sexually transmitted disease?: No Injury to kidneys or bladder?: No Painful intercourse?: No Weak stream?: No Erection problems?: No Penile pain?: No  Gastrointestinal Nausea?: No Vomiting?: No Indigestion/heartburn?: No Diarrhea?: No Constipation?: No  Constitutional Fever: No Night sweats?: No Weight loss?: No Fatigue?: No  Skin Skin rash/lesions?: No Itching?: No  Eyes Blurred vision?: No Double vision?: No  Ears/Nose/Throat Sore throat?: No Sinus problems?: No  Hematologic/Lymphatic Swollen glands?: No Easy bruising?: No  Cardiovascular Leg swelling?: No Chest pain?: No  Respiratory Cough?: No Shortness of breath?: No  Endocrine Excessive thirst?: No  Musculoskeletal Back pain?: No Joint pain?: No  Neurological Headaches?: No Dizziness?: No  Psychologic Depression?: No Anxiety?: No  Physical Exam: BP 138/68   Pulse 84   Ht 5\' 9"  (1.753 m)   Wt 211 lb (95.7 kg)   BMI 31.16 kg/m   Constitutional:  Alert and oriented, No acute distress.   HEENT: [place] AT, moist mucus membranes.  Trachea midline, no masses. Cardiovascular: No clubbing, cyanosis, or edema. Respiratory: Normal respiratory effort, no increased work of breathing. GI: Abdomen is soft, nontender, nondistended, no abdominal masses.  Obese.   Skin: No rashes, bruises or suspicious lesions Neurologic: Grossly intact, no focal deficits, moving all 4 extremities.  Mild hand tremor.  Ambulating with walker.   Psychiatric: Normal mood and affect.  Laboratory Data: Lab Results  Component Value Date   WBC 12.7 (H) 10/14/2015   HGB 13.1 10/14/2015   HCT 37.4 (L) 10/14/2015   MCV 90.0 10/14/2015   PLT 160 10/14/2015    Lab Results  Component Value Date   CREATININE 1.10 10/14/2015      Pertinent Imaging: Study Result   CLINICAL DATA:  Right renal mass.  EXAM: MRI ABDOMEN WITHOUT AND WITH CONTRAST  TECHNIQUE: Multiplanar multisequence MR imaging of the abdomen was performed both before and after the administration of intravenous contrast.  CONTRAST:  58mL MULTIHANCE GADOBENATE DIMEGLUMINE 529 MG/ML IV SOLN  COMPARISON:  11/10/2015  FINDINGS: Lower chest: No pleural fluid.  No pericardial effusion.  Hepatobiliary: Hepatic steatosis is identified. Cyst within the posterior right lobe measures 7 mm, image number 5 of series 7. Additional cysts are identified within the left lobe. The gallbladder appears normal. No biliary dilatation.  Pancreas: No mass, inflammatory changes, or other parenchymal abnormality identified.  Spleen:  Within normal limits in size and appearance.  Adrenals/Urinary Tract: Left adrenal nodule is identified measuring 2.2 cm and is unchanged from 08/24/2015. This appears to of been complicated by a hemorrhage with areas of increased T1 signal present, image number 6 of series 9. This likely accounts for the increased attenuation on CT from 11/10/2015 peer similar appearance of right adrenal gland adenoma measuring 2.2 cm. Numerous bilateral renal cysts are identified. Hemorrhagic cyst arises from the inferior pole of the left kidney measuring 2.3 cm, image 37 of series 9. A second hemorrhagic cyst is identified within the left mid kidney measuring 1.6 cm, image 15 of series 9. No enhancing kidney lesions identified.  Stomach/Bowel: Visualized portions within the abdomen are unremarkable.  Vascular/Lymphatic: No pathologically enlarged lymph nodes identified. No abdominal aortic aneurysm demonstrated.  Other:  None.  Musculoskeletal: No suspicious bone lesions identified.  IMPRESSION: 1. Bilateral Bosniak category 1 and 2 kidney cysts. 2. Chronic hemorrhage into stable appearing of left adrenal nodule. 3.  Right adrenal adenoma 4. Hepatic steatosis.   Electronically Signed   By: Kerby Moors M.D.   On: 03/14/2016 17:39   MRI personally reviewed today.  Assessment & Plan:    1. Renal cyst/ mass Dedicated CT abdomen and pelvis with contrast previously equivocal.  Follow up MRI with bilateral Bosniak I/II renal cyst, no enhancing renal masses Results reviewed with the patient, I would recommend RUS in 1 year as a precaution  2. 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 Hemorrhage into left adenoma likely compounded by Coumadin. Given age and likely benign nature along with stability, will defer any further work up  3. Nephrolithiasis Incidental 4 mm left nonobstructing renal stone, asymptomatic. No  intervention recommended.   Return in about 1 year (around 03/21/2017) for RUS.  Hollice Espy, MD  [facility] [address] [place], [place] [address] [phone]

## 2016-10-30 ENCOUNTER — Ambulatory Visit: Payer: Medicare Other

## 2016-11-13 ENCOUNTER — Other Ambulatory Visit: Payer: Self-pay | Admitting: Specialist

## 2016-11-13 ENCOUNTER — Ambulatory Visit
Admission: RE | Admit: 2016-11-13 | Discharge: 2016-11-13 | Disposition: A | Discharge status: Home / Self Care | Payer: Medicare Other | Source: Ambulatory Visit | Attending: Specialist | Admitting: Specialist

## 2016-11-13 DIAGNOSIS — I517 Cardiomegaly: Secondary | ICD-10-CM | POA: Insufficient documentation

## 2016-11-13 DIAGNOSIS — K760 Fatty (change of) liver, not elsewhere classified: Secondary | ICD-10-CM | POA: Diagnosis not present

## 2016-11-13 DIAGNOSIS — E278 Other specified disorders of adrenal gland: Secondary | ICD-10-CM | POA: Diagnosis not present

## 2016-11-13 DIAGNOSIS — N200 Calculus of kidney: Secondary | ICD-10-CM | POA: Diagnosis not present

## 2016-11-13 DIAGNOSIS — I700 Atherosclerosis of aorta: Secondary | ICD-10-CM | POA: Diagnosis not present

## 2016-11-13 DIAGNOSIS — J849 Interstitial pulmonary disease, unspecified: Secondary | ICD-10-CM | POA: Diagnosis not present

## 2016-11-17 IMAGING — CT CT ADDITIONAL VIEWS AT NO CHARGE
2 of 4 series · 13 of 32 positions shown, 19 images · IV contrast (isovue)
Comparison: 08/24/2015

CLINICAL DATA: Bilateral renal cystic lesions.

EXAM:
CT ABDOMEN WITHOUT AND WITH CONTRAST
TECHNIQUE: Multidetector CT imaging of the abdomen was performed following the
standard protocol before and following the bolus administration of
intravenous contrast.
CONTRAST:  100 cc Isovue 370 IV

[Series 3: axial arterial · axial · arterial · 0.86mm/px · z∈[-818,-602]mm · 9 of 91 slices shown, 15 images]
[im 10/91  soft-tissue]
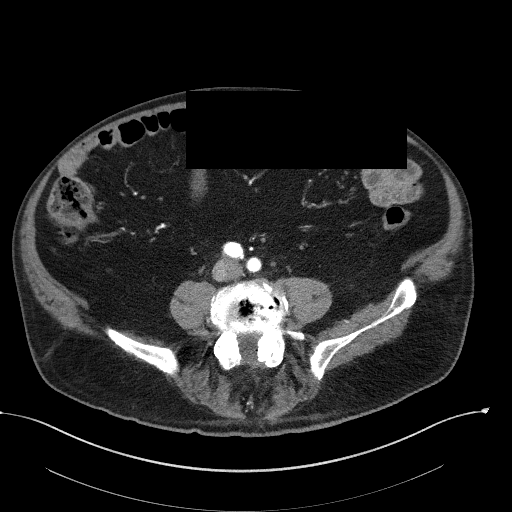
[im 10/91  bone]
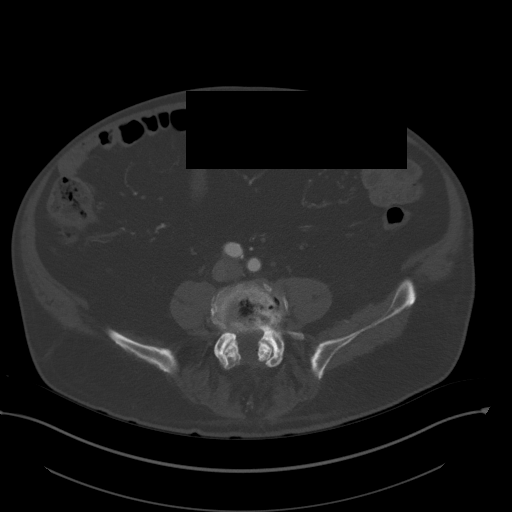
[im 19/91  soft-tissue]
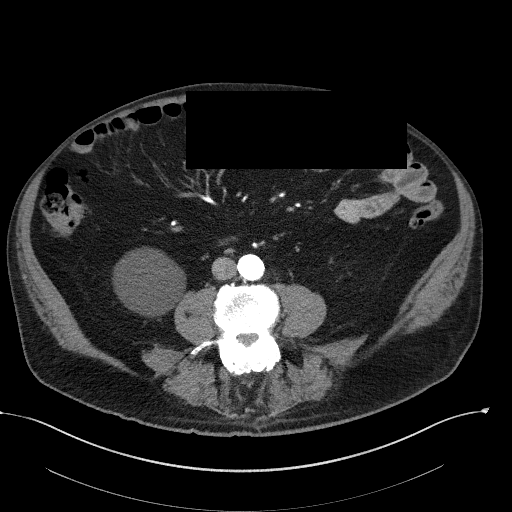
[im 28/91  soft-tissue]
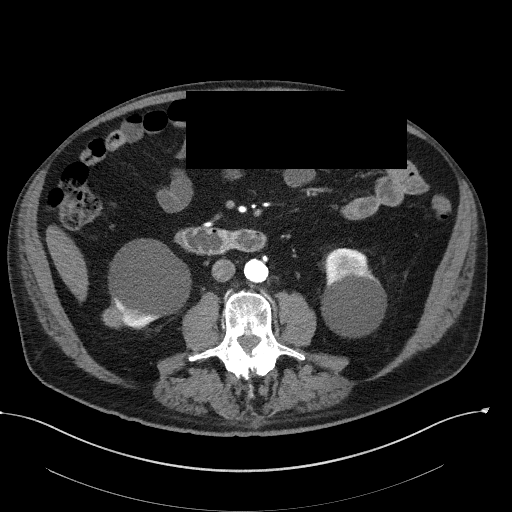
[im 37/91  soft-tissue]
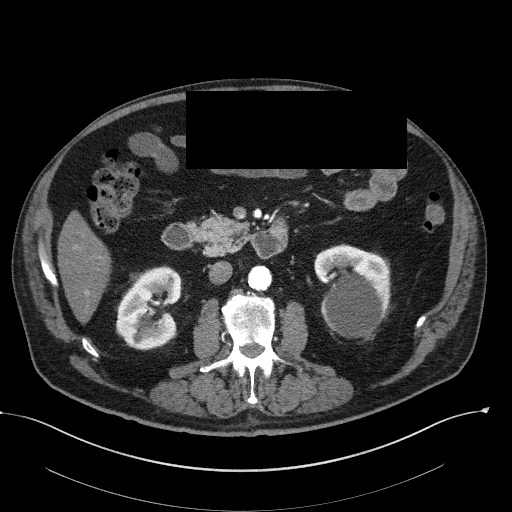
[im 46/91  soft-tissue]
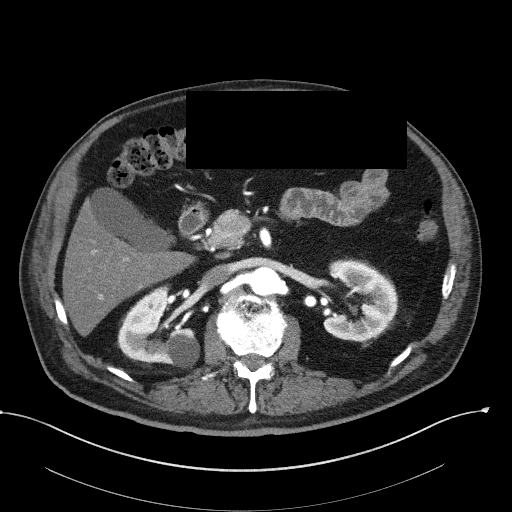
[im 55/91  soft-tissue]
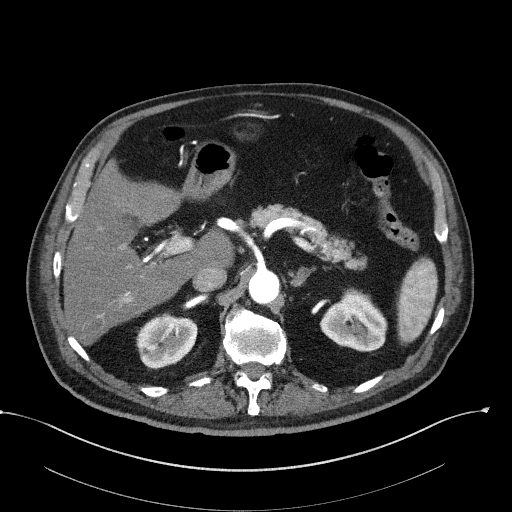
[im 55/91  lung]
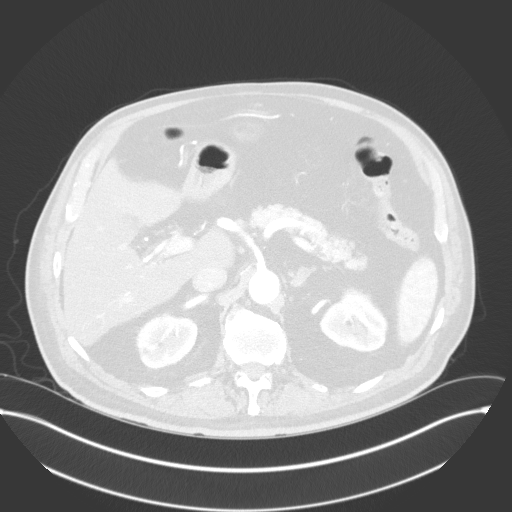
[im 64/91  soft-tissue]
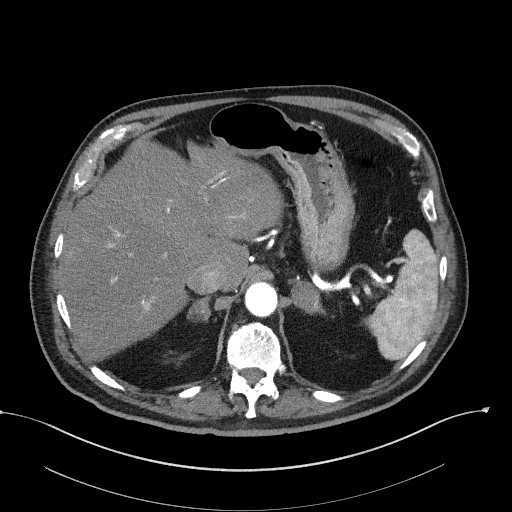
[im 64/91  lung]
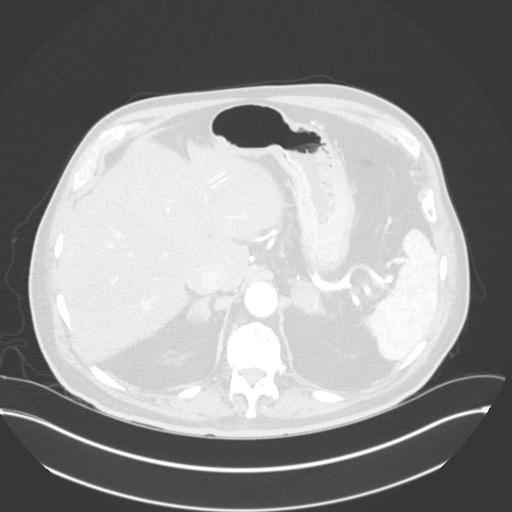
[im 73/91  soft-tissue]
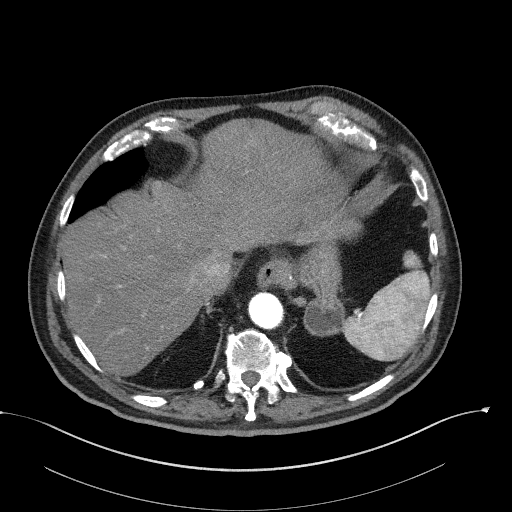
[im 73/91  lung]
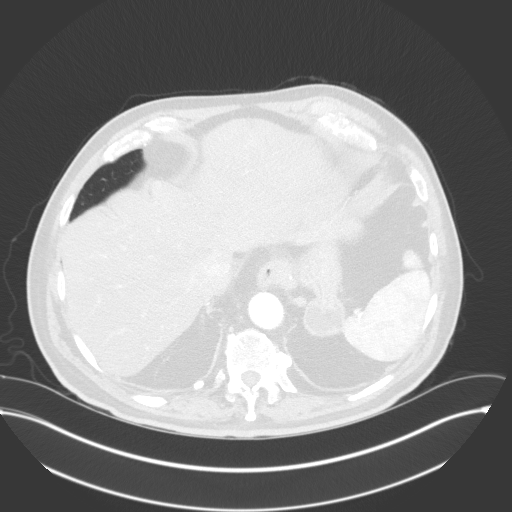
[im 82/91  soft-tissue]
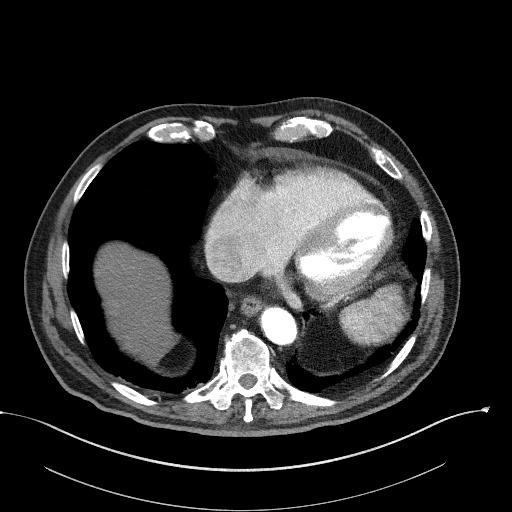
[im 82/91  lung]
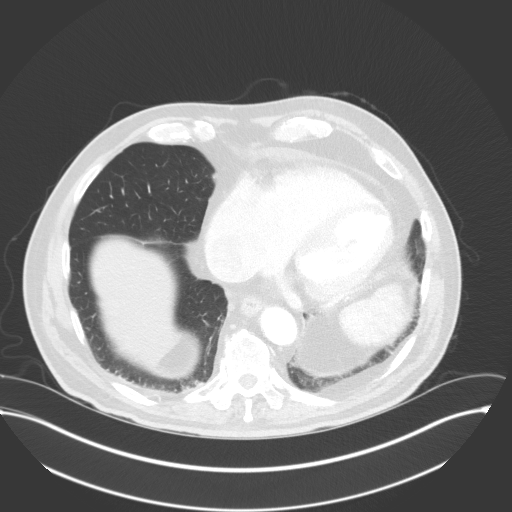
[im 82/91  bone]
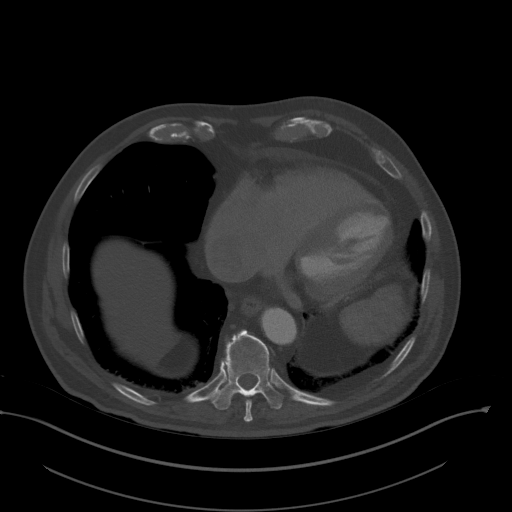

[Series 7: axial nephro · axial · 0.86mm/px · z∈[-818,-737]mm · 4 of 91 slices shown]
[im 10/91  soft-tissue]
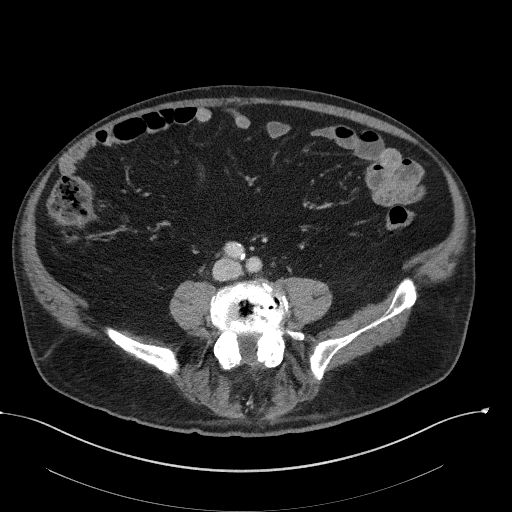
[im 19/91  soft-tissue]
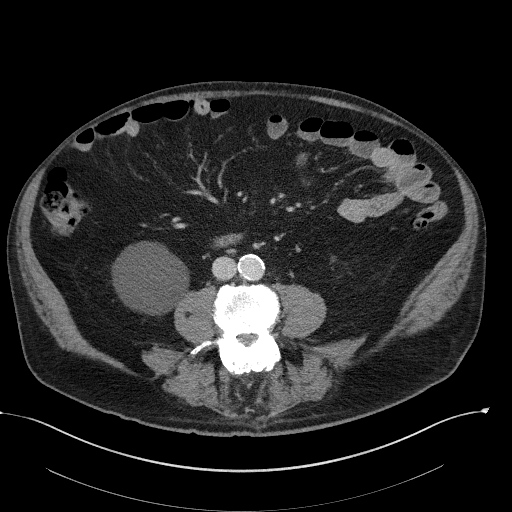
[im 28/91  soft-tissue]
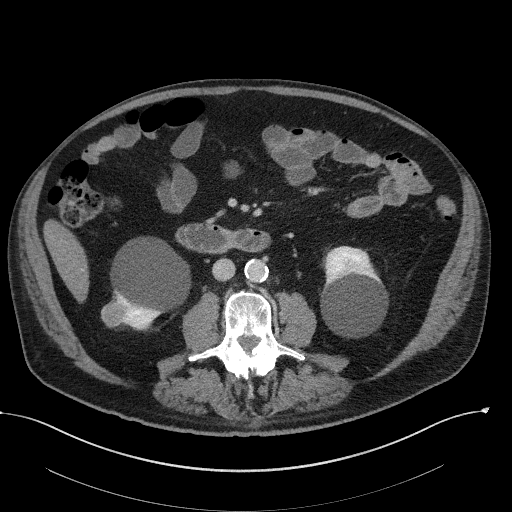
[im 37/91  soft-tissue]
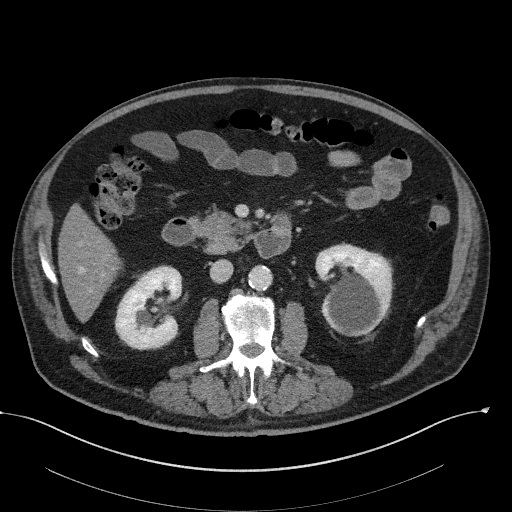

[13 of 32 positions shown; findings below may reference images not displayed]

FINDINGS: Lower chest: Cardiomegaly. No confluent airspace opacity heater rim
bases. No effusions.

Hepatobiliary: Diffuse fatty infiltration of the liver. Small
scattered low-density areas in the liver compatible with small
cysts. Gallbladder unremarkable.

Pancreas: No focal abnormality or ductal dilatation.

Spleen: No focal abnormality.  Normal size.

Adrenals/Urinary Tract: 2.2 cm nodule within the left adrenal gland.
This does not measure fat density on precontrast images. This is
unchanged since prior study. Fat density nodule in the right adrenal
gland is stable.

Numerous bilateral renal cysts, the largest on the right measures
7.2 cm in the lowest pole. The largest on the left measures 5.9 cm
in the lower pole. No change. No hydronephrosis. Punctate
nonobstructing stone in the upper pole of the left kidney.

Stomach/Bowel: Small posterior proximal gastric wall diverticulum.
Visualized large and small bowel unremarkable.

Vascular/Lymphatic: Aortic calcifications without aneurysm. No
adenopathy.

Other: No free fluid or free air.

Musculoskeletal: No acute bony abnormality or focal bone lesion.
IMPRESSION: Numerous bilateral renal cysts which appear simple and stable since
prior study.

Fatty liver.  Small scattered hepatic cysts.

Punctate left upper pole nephrolithiasis.

Bilateral adrenal nodules, unchanged since prior study.

Aortic atherosclerosis.

## 2016-11-17 IMAGING — CT CT CHEST W/O CM
1 series · 15 of 34 positions shown, 19 images · non-contrast
Comparison: 11/11/2014

CLINICAL DATA: Followup right upper lobe pulmonary nodule.

EXAM:
CT CHEST WITHOUT CONTRAST
TECHNIQUE: Multidetector CT imaging of the chest was performed following the
standard protocol without IV contrast.

[Series 2: thorax · axial · 0.79mm/px · z∈[-656,-394]mm · 15 of 155 slices shown, 19 images]
[im 12/155  mediastinal]
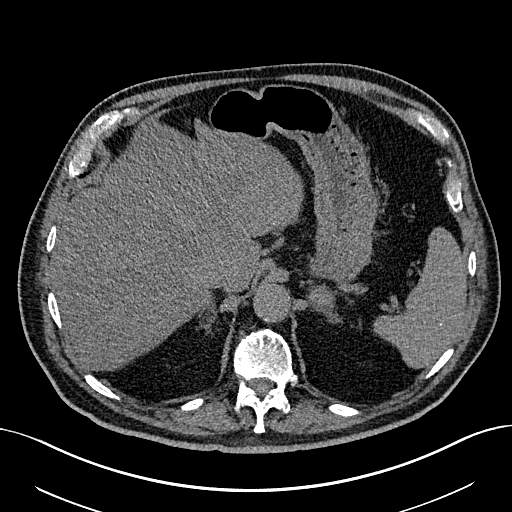
[im 12/155  lung]
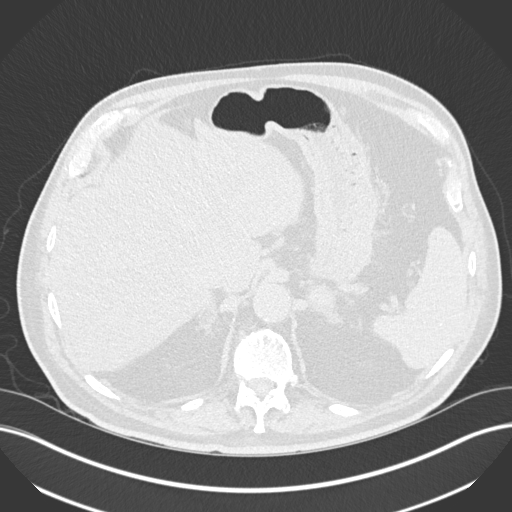
[im 23/155  lung]
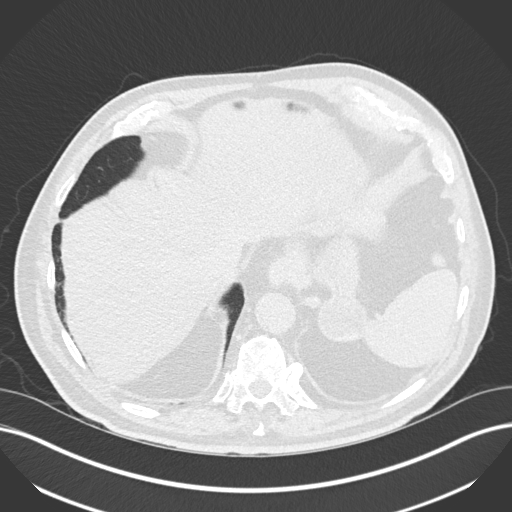
[im 31/155  lung]
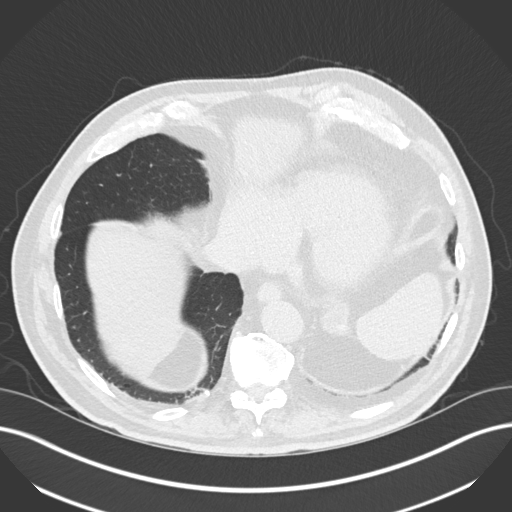
[im 40/155  lung]
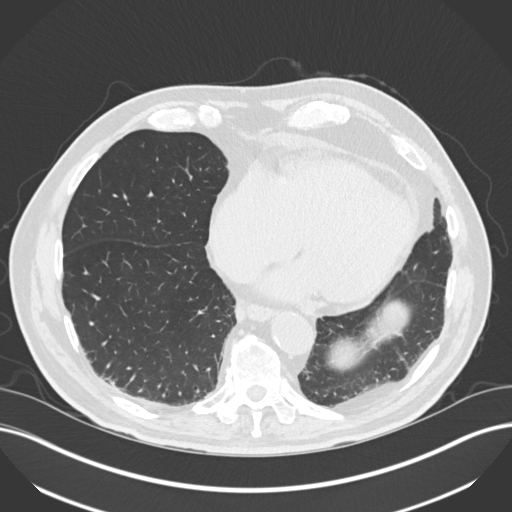
[im 52/155  mediastinal]
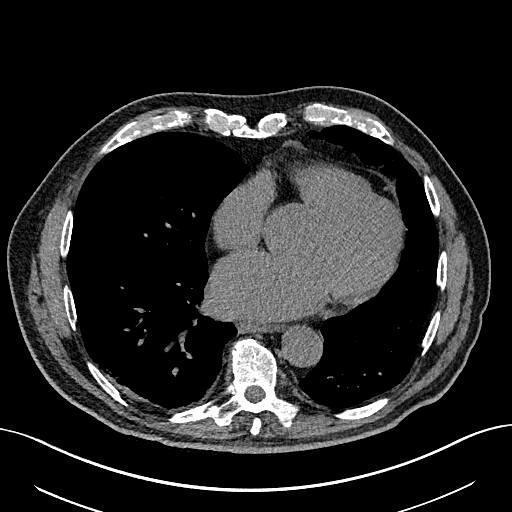
[im 52/155  lung]
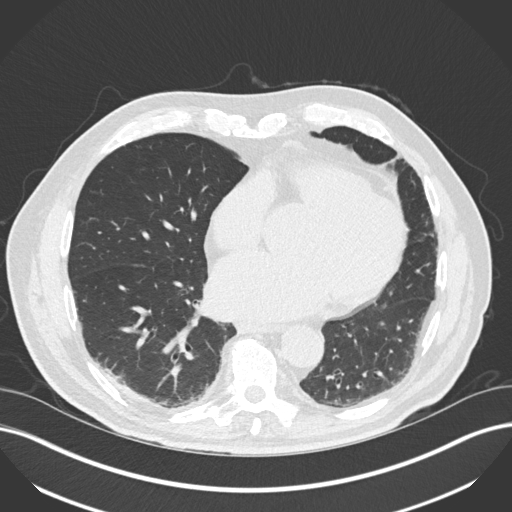
[im 62/155  lung]
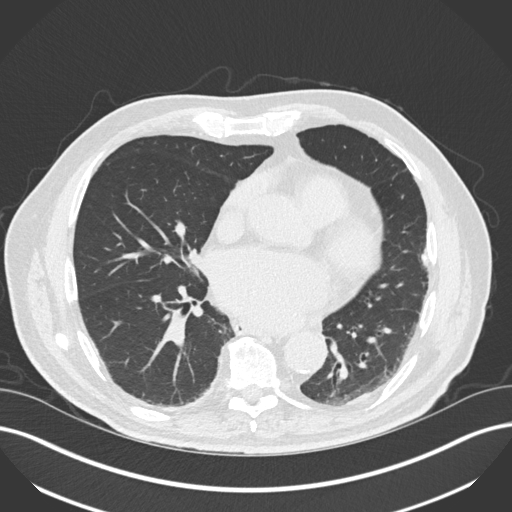
[im 69/155  lung]
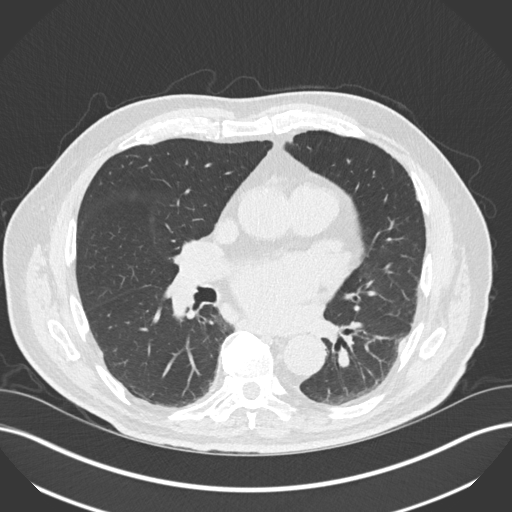
[im 80/155  lung]
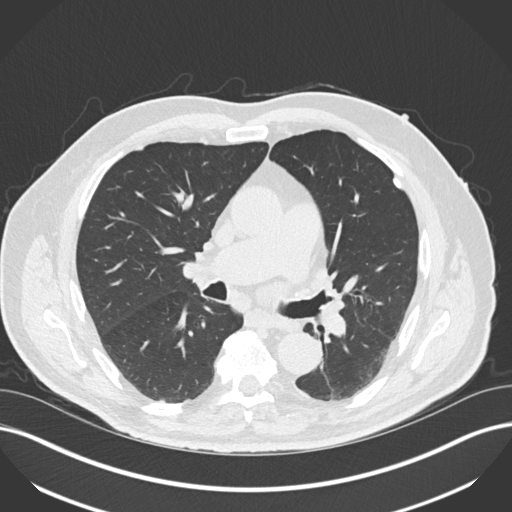
[im 86/155  mediastinal]
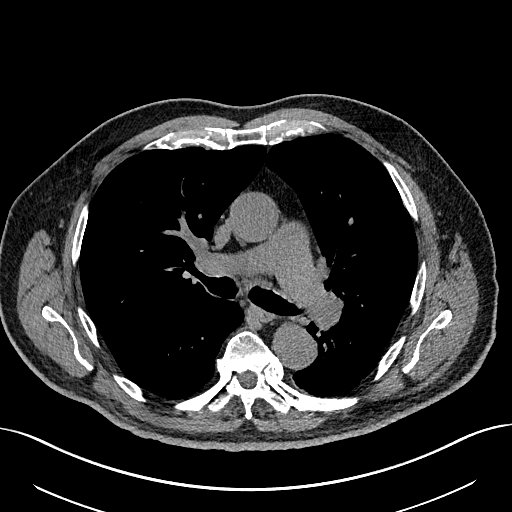
[im 86/155  lung]
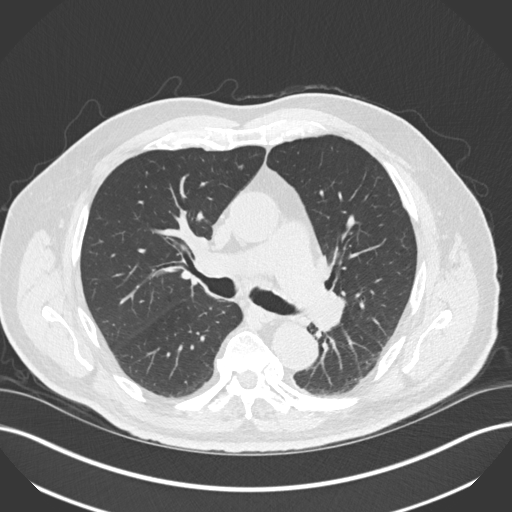
[im 93/155  lung]
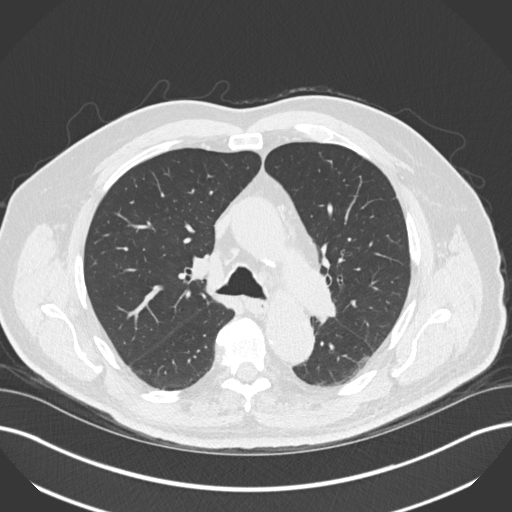
[im 103/155  lung]
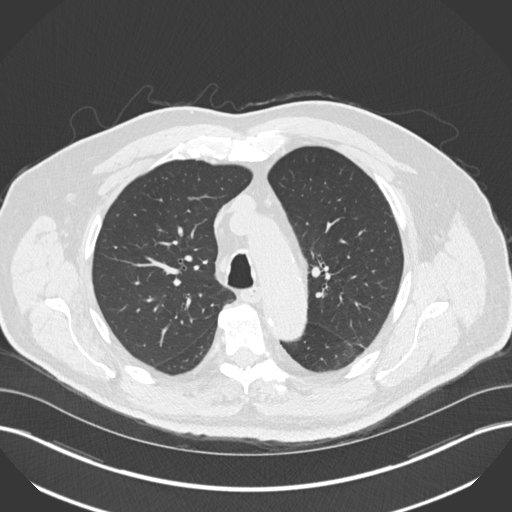
[im 115/155  lung]
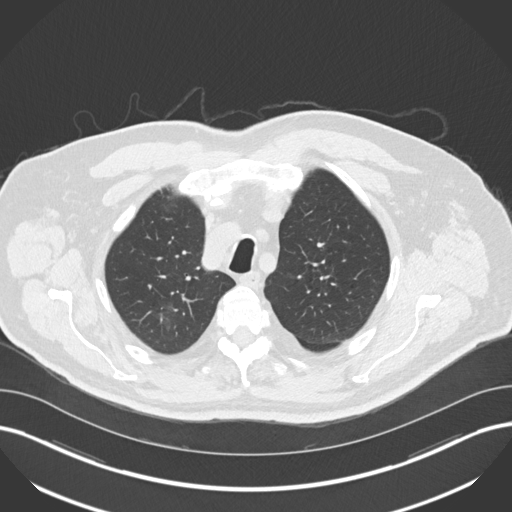
[im 124/155  mediastinal]
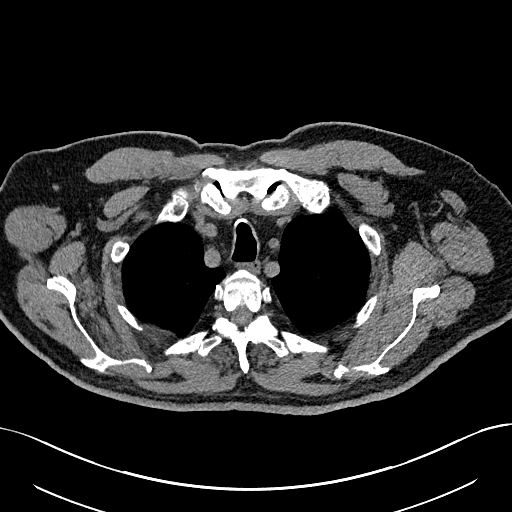
[im 124/155  lung]
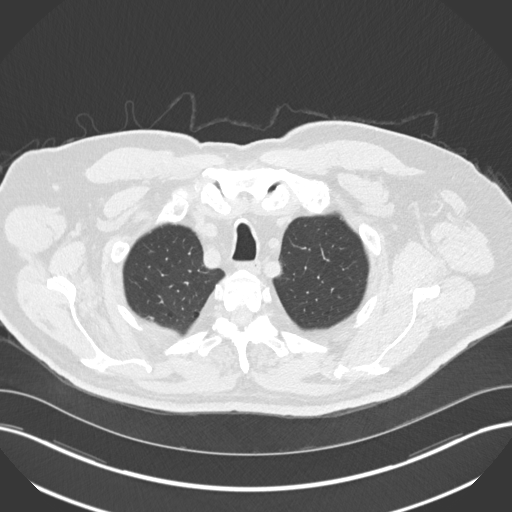
[im 132/155  lung]
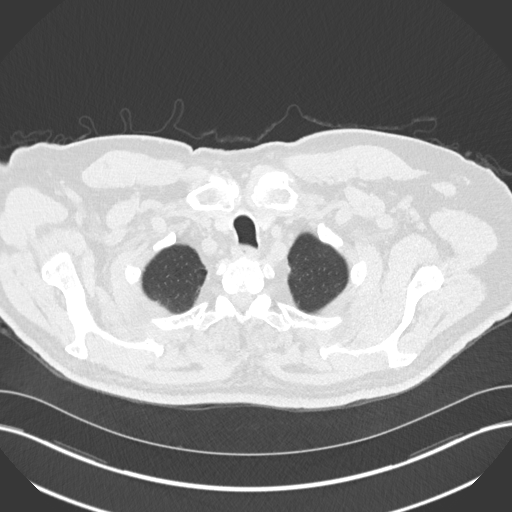
[im 143/155  lung]
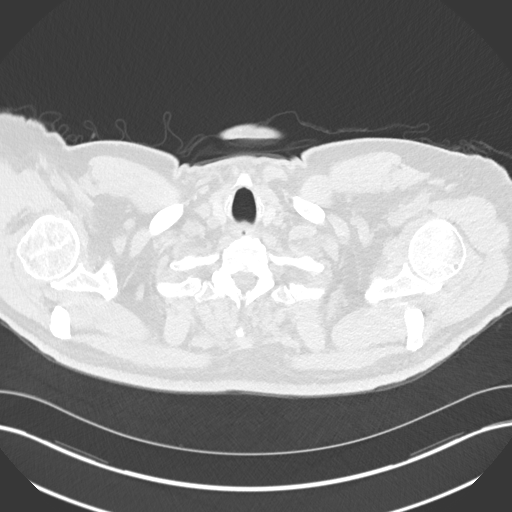

[15 of 34 positions shown; findings below may reference images not displayed]

FINDINGS: Cardiovascular: Stable mild cardiomegaly. Aortic atherosclerosis
noted. LAD and left circumflex coronary artery calcification again
noted.

Mediastinum/Lymph Nodes: 1.4 cm subcarinal mediastinal lymph node
shows no significant change compared to previous study when it
measured 1.5 cm. Other small less than 1 cm mediastinal lymph nodes
are also stable. No definite hilar lymphadenopathy seen on this
unenhanced exam. No axillary lymphadenopathy.

Lungs/Pleura: Faint ground-glass opacity in the posterior right
upper lobe shows no significant change, measuring 1.2 x 1.9 cm on
image 41/ series 3 compared to 1.3 x 1.9 cm previously. No solid
pulmonary nodules or masses identified. No evidence of pulmonary
consolidation or pleural effusion. Mild bilateral calcified pleural
plaque seen, consistent with asbestos related pleural disease.

Upper abdomen: Stable hepatic steatosis and small low-attenuation
lesion in the left hepatic lobe. No significant change in bilateral
adrenal nodules.

Musculoskeletal: No chest wall mass or suspicious bone lesions
identified.
IMPRESSION: Stable faint ground-glass opacity in posterior right upper lobe.
Low-grade adenocarcinoma cannot be excluded. Continued followup by
chest CT recommended in 12 months. This recommendation follows the
consensus statement: Guidelines for Management of Small Pulmonary
Nodules Detected on CT Images: From the [HOSPITAL] 6995;
published online before print (10.1148/radiol.1810171772).

Stable shotty mediastinal lymph nodes, largest in subcarinal region
measuring 1.4 cm.

Stable asbestos related pleural disease.

Aortic atherosclerosis and coronary artery calcification.

## 2017-03-13 ENCOUNTER — Ambulatory Visit
Admission: RE | Admit: 2017-03-13 | Discharge: 2017-03-13 | Disposition: A | Discharge status: Home / Self Care | Payer: Medicare Other | Source: Ambulatory Visit | Attending: Urology | Admitting: Urology

## 2017-03-13 DIAGNOSIS — N281 Cyst of kidney, acquired: Secondary | ICD-10-CM | POA: Insufficient documentation

## 2017-03-20 ENCOUNTER — Ambulatory Visit: Payer: Medicare Other | Admitting: Urology

## 2017-03-26 ENCOUNTER — Ambulatory Visit: Payer: Medicare Other | Admitting: Urology

## 2017-04-03 ENCOUNTER — Ambulatory Visit (INDEPENDENT_AMBULATORY_CARE_PROVIDER_SITE_OTHER): Payer: Medicare Other | Admitting: Urology

## 2017-04-03 ENCOUNTER — Encounter: Payer: Self-pay | Admitting: Urology

## 2017-04-03 VITALS — BP 132/67 | HR 80 | Ht 69.0 in | Wt 212.0 lb

## 2017-04-03 DIAGNOSIS — E278 Other specified disorders of adrenal gland: Secondary | ICD-10-CM

## 2017-04-03 DIAGNOSIS — E279 Disorder of adrenal gland, unspecified: Secondary | ICD-10-CM | POA: Diagnosis not present

## 2017-04-03 DIAGNOSIS — N200 Calculus of kidney: Secondary | ICD-10-CM

## 2017-04-03 DIAGNOSIS — R351 Nocturia: Secondary | ICD-10-CM | POA: Diagnosis not present

## 2017-04-03 DIAGNOSIS — N281 Cyst of kidney, acquired: Secondary | ICD-10-CM | POA: Diagnosis not present

## 2017-04-03 NOTE — Progress Notes (Signed)
9:02 AM  03/21/16  Dennis Savage 10/05/1929 [id number]  Referring provider: [name], MD No address on file  Chief Complaint  Patient presents with  . Renal Cyst    1year follow up    HPI: 83 yo M who returns for annual follow-up of multiple GU issues.  Nocturia/ LUTS Patient reports that he gets up 2-3x nightly, stable.  He denies any other urinary symptoms.  He continues on Flomax.  He does have untreated OSA.  He was dx 15 years ago but was unable to tolerate CPAP. He denies leg swelling. He is currently on a diuretic.   History of bladder lesion He also has a history of ? Transurethral surgery for a bladder lesion by [name] >5 years ago.  He does not think that he has a  history of bladder cancer. Pathology and surgical dictation unavailable. Unable to locate records.  No gross hematuria.  Renal cyst / adrenal nodule  Patient was told he has renal cysts by his nephrologist (Dr.Singh approximately two year ago). 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 In addition, it appears that there was a hyperattenuating/ hyper enhancing exophytic lesion on the lower pole of the right kidney measuring 1.9 cm and other complex cysts on 02/2015.  Repeat CT abd/ pelvis with and without contrast (dedicated renal mass protocol) on 08/24/2015, the indeterminate left lower pole lesion has remained stable in size 19 mm and has equivocal/ low level enhancement.    Follow-up MRI abdomen with and without contrast on 03/14/2016 shows a 2.3 hemorrhagic left renal cyst as well as a 1.6 cm left hemorrhagic renal cyst. There is no enhancing renal masses identified. He has had interval hemorrhage into the left adrenal adenoma measuring 2.2 cm overall unchanged from 5/17.  Right adrenal  adenoma 2.2 cm also stable in size.  In the light of no non-concerning features, he underwent routine renal ultrasound this year on 02/2017, essentially unchanged from previous exams.  Since her last visit, he was referred to Dr. Gabriel Carina for metabolic workup of his adrenal nodules.  He is yet to hear the results back from her although it appears unremarkable.  Flank pain or gross hematuria.  Nephrolithiasis Incidental 4 mm left upper pole stone on CT scan. Asymptomatic.   PMH: Past Medical History:  Diagnosis Date  . Acute diarrhea 02/25/2015  . After cataract not obscuring vision 12/21/2011  . 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 Apnea, sleep 07/13/2015  . Arthralgia of multiple joints 06/15/2011  . Arthritis   . Artificial lens present 12/08/2010  . Atrial fibrillation ([place])   . Benign essential HTN 11/03/2010  . Chronic obstructive pulmonary disease ([place]) 11/03/2010  . CN (constipation) 06/15/2011  . 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 Dizziness 02/25/2015  . GERD (gastroesophageal reflux disease)   . Heart murmur   . Hypertension   . Interstitial lung disease ([place]) 10/13/2013  . LBP (low back pain) 11/03/2010  . 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 Peripheral vascular disease ([place]) 11/03/2010  . Retinal hemorrhage 03/16/2014    Surgical History: Past Surgical History:  Procedure Laterality Date  . APPENDECTOMY    . BACK SURGERY    . CARPAL TUNNEL RELEASE Bilateral   . EYE SURGERY Bilateral    Cataract Extraction  with IOL  . HERNIA REPAIR     Umbilical Hernia X 3  . JOINT REPLACEMENT     bilat knees  . NASAL SINUS SURGERY    . REPLACEMENT TOTAL KNEE Bilateral   . SHOULDER SURGERY Right   . TONSILLECTOMY    . TOTAL HIP ARTHROPLASTY Right 10/12/2015   Procedure: TOTAL HIP ARTHROPLASTY;  Surgeon: Dereck Leep, MD;  Location: [facility] ORS;  Service: Orthopedics;  Laterality: Right;    Home Medications:  Allergies as of 04/03/2017      Reactions   Penicillins Hives, Swelling    Has patient had a PCN reaction causing immediate rash, facial/tongue/throat swelling, SOB or lightheadedness with hypotension: yES Has patient had a PCN reaction causing severe rash involving mucus membranes or skin necrosis: UNKNOWN Has patient had a PCN reaction that required hospitalization NO Has patient had a PCN reaction occurring within the last 10 years: nO If all of the above answers are "NO", then may proceed with Cephalosporin use.      Medication List        Accurate as of 04/03/17  9:02 AM. Always use your most recent med list.          acetaminophen 325 MG tablet Commonly known as:  TYLENOL Take 650 mg by mouth every 6 (six) hours as needed.   amLODipine 10 MG tablet Commonly known as:  NORVASC Take 10 mg by mouth daily.   chlorthalidone 25 MG tablet Commonly known as:  HYGROTON Take 25 mg by mouth daily as needed.   docusate sodium 250 MG capsule Commonly known as:  COLACE Take 250 mg by mouth daily as needed for constipation.   metoprolol tartrate 25 MG tablet Commonly known as:  LOPRESSOR Take 25 mg by mouth.   predniSONE 10 MG tablet Commonly known as:  DELTASONE   PRESERVISION AREDS 2 Caps Take 2 capsules by mouth daily.   PRESERVISION AREDS 2 PO Take by mouth.   tamsulosin 0.4 MG Caps capsule Commonly known as:  FLOMAX Take 1 capsule (0.4 mg total) by mouth daily.   traMADol 50 MG tablet Commonly known as:  ULTRAM Take 1-2 tablets (50-100 mg total) by mouth every 4 (four) hours as needed for moderate pain.   XARELTO 20 MG Tabs tablet Generic drug:  rivaroxaban Take 20 mg by mouth daily with supper.       Allergies:  Allergies  Allergen Reactions  . Penicillins Hives and Swelling    Has patient had a PCN reaction causing immediate rash, facial/tongue/throat swelling, SOB or lightheadedness with hypotension: yES Has patient had a PCN reaction causing severe rash involving mucus membranes or skin necrosis: UNKNOWN Has patient had a PCN  reaction that required hospitalization NO Has patient had a PCN reaction occurring within the last 10 years: nO If all of the above answers are "NO", then may proceed with Cephalosporin use.    Family History: Family History  Problem Relation Age of Onset  . Bladder Cancer Neg Hx   . Kidney cancer Neg Hx   . Prostate cancer Neg Hx     Social History:  reports that he has quit smoking. His smoking use included cigarettes. He smoked 1.00 pack per day. he has never used smokeless tobacco. He reports that he does not drink alcohol or use drugs.  ROS: UROLOGY Frequent Urination?: No Hard to postpone urination?: No Burning/pain with urination?: No Get up at night to urinate?: No Leakage of urine?: No Urine stream starts and  stops?: No Trouble starting stream?: No Do you have to strain to urinate?: No Blood in urine?: No Urinary tract infection?: No Sexually transmitted disease?: No Injury to kidneys or bladder?: No Painful intercourse?: No Weak stream?: No Erection problems?: No Penile pain?: No  Gastrointestinal Nausea?: No Vomiting?: No Indigestion/heartburn?: No Diarrhea?: No Constipation?: No  Constitutional Fever: No Night sweats?: No Weight loss?: No Fatigue?: No  Skin Skin rash/lesions?: No Itching?: No  Eyes Blurred vision?: No Double vision?: No  Ears/Nose/Throat Sore throat?: No Sinus problems?: No  Hematologic/Lymphatic Swollen glands?: No Easy bruising?: No  Cardiovascular Leg swelling?: No Chest pain?: No  Respiratory Cough?: No Shortness of breath?: No  Endocrine Excessive thirst?: No  Musculoskeletal Back pain?: Yes Joint pain?: No  Neurological Headaches?: No Dizziness?: No  Psychologic Depression?: No Anxiety?: No  Physical Exam: BP 132/67   Pulse 80   Ht 5\' 9"  (1.753 m)   Wt 212 lb (96.2 kg)   BMI 31.31 kg/m   Constitutional:  Alert and oriented, No acute distress.  Accompanied by daughter and great  granddaughter. HEENT: [place] AT, moist mucus membranes.  Trachea midline, no masses. Cardiovascular: No clubbing, cyanosis, or edema. Respiratory: Normal respiratory effort, no increased work of breathing. GI: Abdomen is soft, nontender, nondistended, no abdominal masses.  Obese.  Umbilicus surgically absent. GU: No CVA tenderness Skin: No rashes, bruises or suspicious lesions Neurologic: Grossly intact, no focal deficits, moving all 4 extremities.  Ambulating with cane Psychiatric: Normal mood and affect.  Laboratory Data: Lab Results  Component Value Date   WBC 12.7 (H) 10/14/2015   HGB 13.1 10/14/2015   HCT 37.4 (L) 10/14/2015   MCV 90.0 10/14/2015   PLT 160 10/14/2015    Lab Results  Component Value Date   CREATININE 1.10 10/14/2015     Pertinent Imaging: CLINICAL DATA:  Renal cysts  EXAM: RENAL / URINARY TRACT ULTRASOUND COMPLETE  COMPARISON:  MRI 03/14/2016, CT 11/10/2015  FINDINGS: Right Kidney:  Length: 15.8 cm. Cortical echogenicity within normal limits. No hydronephrosis. Multiple cysts within the right kidney. The largest cysts were measured. Within the lower pole of the right kidney, there is a 6.4 x 8 x 7.2 cm cyst. Also within the lower pole of the right kidney is a 1.8 x 1.6 x 1.4 cm cyst. Within the upper pole of the kidney, there is a 2.7 x 2.8 x 2.5 cm cyst  Left Kidney:  Length: 14.1 cm. Slight increased cortical echogenicity. No hydronephrosis. Multiple cysts, the largest were measured.  Within the mid to upper pole of the left kidney is a 1.1 x 0.9 x 1 cm cysts. Within the mid to lower pole of the left kidney is a 6 x 5.9 x 6.6 cm cyst.  Bladder:  Appears normal for degree of bladder distention.  IMPRESSION: 1. Negative for hydronephrosis. Numerous bilateral cysts, the largest measured cysts were present on the previous exams. 2. Slight increased cortical echogenicity of the left kidney.   Electronically Signed   By: Donavan Foil M.D.   On: 03/13/2017 19:12  RUS personally reviewed this morning.  Assessment & Plan:    1. Renal cysts Dedicated CT abdomen and pelvis with contrast previously equivocal.  Follow up MRI with bilateral Bosniak I/II renal cyst, no enhancing renal masses Stable f/u renal ultraound this year Given his age and non-concerning findings, recommend deferring further imaging  2. 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 Hemorrhage into left adenoma likely  compounded by Coumadin. Previously elected to defer that further workup but is now undergoing imaging/metabolic workup with Dr. Gabriel Carina  3. Nephrolithiasis Incidental 4 mm left nonobstructing renal stone, asymptomatic. No intervention recommended.  4. Nocturia Stable with minimal bother Continue Flomax Would likely benefit from compliance with CPAP  Hollice Espy, MD [facility] [address], [place], [place] [address] [phone]

## 2017-06-06 ENCOUNTER — Other Ambulatory Visit: Payer: Self-pay

## 2017-06-06 ENCOUNTER — Emergency Department
Admission: EM | Admit: 2017-06-06 | Discharge: 2017-06-06 | Disposition: A | Discharge status: Home / Self Care | Payer: Medicare Other | Attending: Emergency Medicine | Admitting: Emergency Medicine

## 2017-06-06 ENCOUNTER — Emergency Department: Payer: Medicare Other

## 2017-06-06 ENCOUNTER — Encounter: Payer: Self-pay | Admitting: Emergency Medicine

## 2017-06-06 DIAGNOSIS — Z79899 Other long term (current) drug therapy: Secondary | ICD-10-CM | POA: Diagnosis not present

## 2017-06-06 DIAGNOSIS — M25552 Pain in left hip: Secondary | ICD-10-CM | POA: Insufficient documentation

## 2017-06-06 DIAGNOSIS — Z96641 Presence of right artificial hip joint: Secondary | ICD-10-CM | POA: Diagnosis not present

## 2017-06-06 DIAGNOSIS — I10 Essential (primary) hypertension: Secondary | ICD-10-CM | POA: Insufficient documentation

## 2017-06-06 DIAGNOSIS — Z87891 Personal history of nicotine dependence: Secondary | ICD-10-CM | POA: Insufficient documentation

## 2017-06-06 DIAGNOSIS — Z7901 Long term (current) use of anticoagulants: Secondary | ICD-10-CM | POA: Diagnosis not present

## 2017-06-06 DIAGNOSIS — J449 Chronic obstructive pulmonary disease, unspecified: Secondary | ICD-10-CM | POA: Insufficient documentation

## 2017-06-06 DIAGNOSIS — Z96653 Presence of artificial knee joint, bilateral: Secondary | ICD-10-CM | POA: Insufficient documentation

## 2017-06-06 MED ORDER — TRAMADOL HCL 50 MG PO TABS
50.0000 mg | ORAL_TABLET | Freq: Once | ORAL | Status: AC
  Administered 2017-06-06: 50 mg via ORAL
  Filled 2017-06-06: qty 1

## 2017-06-06 MED ORDER — TRAMADOL HCL 50 MG PO TABS: 50.0000 mg | ORAL_TABLET | Freq: Four times a day (QID) | ORAL | 0 refills | Status: DC | PRN

## 2017-06-06 NOTE — ED Notes (Signed)
See triage note  Presents with left hip pain  Denies any fall but states he rolled over in bed and felt left hip pain  Is able to bear wt but left is painful  No deformity noted

## 2017-06-06 NOTE — ED Provider Notes (Signed)
[facility] Emergency Department Provider Note  ___________________________________________   None    (approximate)  I have reviewed the triage vital signs and the nursing notes.   HISTORY  Chief Complaint Hip Pain  HPI Dennis Savage is a 84 y.o. male is here with complaint of left hip pain after rolling over in bed last night.  He denies any injury to his leg in the last several days.  Patient is status post total hip replacement on the right 2 years ago.  Patient is able to ambulate but states that bearing weight or walking increases his pain.  He also has had difficulty getting from a standing to seated position when the seat level is low such as a toilet.  Currently he rates his pain as 3/10.  Past Medical History:  Diagnosis Date  . Acute diarrhea 02/25/2015  . After cataract not obscuring vision 12/21/2011  . 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 Apnea, sleep 07/13/2015  . Arthralgia of multiple joints 06/15/2011  . Arthritis   . Artificial lens present 12/08/2010  . Atrial fibrillation ([place])   . Benign essential HTN 11/03/2010  . Chronic obstructive pulmonary disease ([place]) 11/03/2010  . CN (constipation) 06/15/2011  . 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 Dizziness 02/25/2015  . GERD (gastroesophageal reflux disease)   . Heart murmur   . Hypertension   . Interstitial lung disease ([place]) 10/13/2013  . LBP (low back pain) 11/03/2010  . 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 Peripheral vascular disease ([place]) 11/03/2010  . Retinal hemorrhage 03/16/2014    Patient Active Problem List   Diagnosis Date Noted  . 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 Degenerative arthritis of hip 09/12/2015  . Atrial fibrillation ([place]) 07/13/2015  . Apnea, sleep 07/13/2015  . Acute diarrhea 02/25/2015  . Dizziness 02/25/2015  . 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 Lymphangioma, 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  . Retinal hemorrhage 03/16/2014  . Interstitial lung disease ([place]) 10/13/2013    . After cataract not obscuring vision 12/21/2011  . History of surgical procedure 12/21/2011  . CN (constipation) 06/15/2011  . Encounter for general adult medical examination without abnormal findings 06/15/2011  . Arthralgia of multiple joints 06/15/2011  . 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 Artificial lens present 12/08/2010  . 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 Benign essential HTN 11/03/2010  . Chronic obstructive pulmonary disease ([place]) 11/03/2010  . Benign hypertension 11/03/2010  . LBP (low back pain) 11/03/2010  . Peripheral vascular disease ([place]) 11/03/2010  . Current tobacco use 11/03/2010    Past Surgical History:  Procedure Laterality Date  . APPENDECTOMY    . BACK SURGERY    . CARPAL TUNNEL RELEASE Bilateral   . EYE SURGERY Bilateral    Cataract Extraction with IOL  . HERNIA REPAIR     Umbilical Hernia X 3  . JOINT REPLACEMENT     bilat knees  . NASAL SINUS SURGERY    . REPLACEMENT TOTAL KNEE Bilateral   . SHOULDER SURGERY Right   . TONSILLECTOMY    . TOTAL HIP ARTHROPLASTY Right 10/12/2015   Procedure: TOTAL HIP ARTHROPLASTY;  Surgeon: Dereck Leep, MD;  Location: [facility] ORS;  Service: Orthopedics;  Laterality: Right;    Prior to Admission medications   Medication Sig Start Date End Date Taking? Authorizing Provider  acetaminophen (TYLENOL) 325 MG tablet Take 650 mg by mouth every 6 (six) hours as needed.    [provider]  amLODipine (NORVASC) 10 MG tablet Take 10 mg by mouth  daily.    [provider]  chlorthalidone (HYGROTON) 25 MG tablet Take 25 mg by mouth daily as needed.     [provider]  docusate sodium (COLACE) 250 MG capsule Take 250 mg by mouth daily as needed for constipation.    [provider]  metoprolol tartrate (LOPRESSOR) 25 MG tablet Take 25 mg by mouth. 10/22/12   [provider]  Multiple Vitamins-Minerals (PRESERVISION AREDS 2 PO) Take by mouth.    [provider]  Multiple  Vitamins-Minerals (PRESERVISION AREDS 2) CAPS Take 2 capsules by mouth daily.    [provider]  predniSONE (DELTASONE) 10 MG tablet  03/22/17   [provider]  rivaroxaban (XARELTO) 20 MG TABS tablet Take 20 mg by mouth daily with supper.    [provider]  traMADol (ULTRAM) 50 MG tablet Take 1 tablet (50 mg total) by mouth every 6 (six) hours as needed for moderate pain. 06/06/17   Johnn Hai, PA-C    Allergies Penicillins  Family History  Problem Relation Age of Onset  . Bladder Cancer Neg Hx   . Kidney cancer Neg Hx   . Prostate cancer Neg Hx     Social History Social History   Tobacco Use  . Smoking status: Former Smoker    Packs/day: 1.00    Types: Cigarettes  . Smokeless tobacco: Never Used  Substance Use Topics  . Alcohol use: No  . Drug use: No    Review of Systems Constitutional: No fever/chills Cardiovascular: Denies chest pain. Respiratory: Denies shortness of breath. Musculoskeletal: Positive for left hip pain.  Positive for right total hip replacement. Skin: Negative for rash. Neurological: Negative for focal weakness or numbness. ___________________________________________   PHYSICAL EXAM:  VITAL SIGNS: ED Triage Vitals  Enc Vitals Group     BP 06/06/17 0703 (!) 149/81     Pulse Rate 06/06/17 0703 76     Resp 06/06/17 0703 18     Temp 06/06/17 0703 97.7 F (36.5 C)     Temp Source 06/06/17 0703 Oral     SpO2 06/06/17 0703 99 %     Weight 06/06/17 0702 215 lb (97.5 kg)     Height 06/06/17 0702 5\' 9"  (1.753 m)     Head Circumference --      Peak Flow --      Pain Score 06/06/17 0702 3     Pain Loc --      Pain Edu? --      Excl. in [place]? --    Constitutional: Alert and oriented. Well appearing and in no acute distress. Eyes: Conjunctivae are normal.  Head: Atraumatic. Neck: No stridor.   Cardiovascular: Normal rate, regular rhythm. Grossly normal heart sounds.  Good peripheral circulation. Respiratory:  Normal respiratory effort.  No retractions. Lungs CTAB. Musculoskeletal: There is point tenderness on palpation of the left hip lateral and posterior aspect.  Range of motion is limited secondary to discomfort but no obvious deformity is noted.  Patient is able to weight-bear.  Neurologic:  Normal speech and language. No gross focal neurologic deficits are appreciated. No gait instability. Skin:  Skin is warm, dry and intact.  No ecchymosis, erythema or abrasions noted. Psychiatric: Mood and affect are normal. Speech and behavior are normal.  ____________________________________________   LABS (all labs ordered are listed, but only abnormal results are displayed)  Labs Reviewed - No data to display  RADIOLOGY  ED MD interpretation:   No acute injury.  Decreased space  femoral head with degenerative changes.  Official radiology report(s): Dg Hip Unilat W Or Wo Pelvis 2-3 Views Left  Result Date: 06/06/2017 CLINICAL DATA:  84 year old male with acute onset left hip pain when rolling over in bed last night. EXAM: DG HIP (WITH OR WITHOUT PELVIS) 2-3V LEFT COMPARISON:  CT Abdomen and Pelvis 11/10/2015. FINDINGS: Preexisting right bipolar hip arthroplasty appears stable. Left femoral head is normally located. Stable and intact pelvis. The proximal left femur appears intact. No acute osseous abnormality identified. Lower lumbar advanced disc degeneration. Calcified iliofemoral atherosclerosis. IMPRESSION: No acute fracture or dislocation identified about the left hip or pelvis. If occult hip fracture is suspected or if the patient is unable to weightbear, MRI is the preferred modality for further evaluation. Electronically Signed   By: Genevie Ann M.D.   On: 06/06/2017 07:47    ____________________________________________   PROCEDURES  Procedure(s) performed: None  Procedures  Critical Care performed: No  ____________________________________________   INITIAL IMPRESSION / ASSESSMENT AND  PLAN / ED COURSE   Clinical Course as of Jun 06 1232  Thu Jun 06, 2017  0805 DG Hip Unilat W or Wo Pelvis 2-3 Views Left [RS]    Clinical Course User Index [RS] Johnn Hai, PA-C   Patient was made aware that that he has arthritic changes but no acute fracture was noted.  Because there was no history of injury and patient states the pain only started after he turned over in bed no further imaging was done.  Patient will make an appointment with the orthopedic department at [facility] for evaluation of his left hip as this was the group that did he is been hip replacement 2 years ago.  Patient has taken tramadol in the past and has not had any adverse reactions to it.  He is aware that this medication could cause drowsiness and increase his risk for falling.  ____________________________________________   FINAL CLINICAL IMPRESSION(S) / ED DIAGNOSES  Final diagnoses:  Acute pain of left hip     ED Discharge Orders        Ordered    traMADol (ULTRAM) 50 MG tablet  Every 6 hours PRN     06/06/17 7741       Note:  This document was prepared using Dragon voice recognition software and may include unintentional dictation errors.    Johnn Hai, PA-C 06/06/17 1234    Lavonia Drafts, MD 06/06/17 1335

## 2017-06-06 NOTE — ED Triage Notes (Addendum)
Pt to triage via w/c with no distress noted; reports "sometime in the night I rolled over and hurt my hip"; pt c/o left hip pain; st able to weight bear but painful

## 2017-06-06 NOTE — Discharge Instructions (Signed)
Follow-up with the orthopedic department at [facility] for any continued hip pain.  Begin taking tramadol 1 every 6 hours as needed for pain.  Be aware that this medication could cause drowsiness and increase your risk for falling.  Use your walker at home to provide extra stability.

## 2017-08-22 ENCOUNTER — Other Ambulatory Visit: Payer: Self-pay | Admitting: Specialist

## 2017-08-22 DIAGNOSIS — R911 Solitary pulmonary nodule: Secondary | ICD-10-CM

## 2017-09-05 ENCOUNTER — Ambulatory Visit
Admission: RE | Admit: 2017-09-05 | Discharge: 2017-09-05 | Disposition: A | Discharge status: Home / Self Care | Payer: Medicare Other | Source: Ambulatory Visit | Attending: Specialist | Admitting: Specialist

## 2017-09-05 DIAGNOSIS — N281 Cyst of kidney, acquired: Secondary | ICD-10-CM | POA: Diagnosis not present

## 2017-09-05 DIAGNOSIS — E279 Disorder of adrenal gland, unspecified: Secondary | ICD-10-CM | POA: Insufficient documentation

## 2017-09-05 DIAGNOSIS — R918 Other nonspecific abnormal finding of lung field: Secondary | ICD-10-CM | POA: Diagnosis not present

## 2017-09-05 DIAGNOSIS — I700 Atherosclerosis of aorta: Secondary | ICD-10-CM | POA: Insufficient documentation

## 2017-09-05 DIAGNOSIS — J61 Pneumoconiosis due to asbestos and other mineral fibers: Secondary | ICD-10-CM | POA: Diagnosis not present

## 2017-09-05 DIAGNOSIS — R911 Solitary pulmonary nodule: Secondary | ICD-10-CM | POA: Insufficient documentation

## 2017-09-05 DIAGNOSIS — J439 Emphysema, unspecified: Secondary | ICD-10-CM | POA: Insufficient documentation

## 2017-09-05 DIAGNOSIS — I2510 Atherosclerotic heart disease of native coronary artery without angina pectoris: Secondary | ICD-10-CM | POA: Insufficient documentation

## 2017-09-05 LAB — POCT I-STAT CREATININE: Creatinine, Ser: 1.2 mg/dL (ref 0.61–1.24)

## 2017-09-05 MED ORDER — IOPAMIDOL (ISOVUE-300) INJECTION 61%
75.0000 mL | Freq: Once | INTRAVENOUS | Status: AC | PRN
  Administered 2017-09-05: 75 mL via INTRAVENOUS

## 2018-02-21 ENCOUNTER — Other Ambulatory Visit: Payer: Self-pay | Admitting: Specialist

## 2018-02-21 DIAGNOSIS — J849 Interstitial pulmonary disease, unspecified: Secondary | ICD-10-CM

## 2018-08-27 ENCOUNTER — Other Ambulatory Visit: Payer: Self-pay

## 2018-08-27 ENCOUNTER — Ambulatory Visit
Admission: RE | Admit: 2018-08-27 | Discharge: 2018-08-27 | Disposition: A | Discharge status: Home / Self Care | Payer: Medicare Other | Source: Ambulatory Visit | Attending: Specialist | Admitting: Specialist

## 2018-08-27 ENCOUNTER — Ambulatory Visit: Payer: Medicare Other

## 2018-08-27 DIAGNOSIS — J849 Interstitial pulmonary disease, unspecified: Secondary | ICD-10-CM | POA: Diagnosis not present

## 2018-09-29 ENCOUNTER — Other Ambulatory Visit: Payer: Self-pay | Admitting: Specialist

## 2018-09-29 DIAGNOSIS — E278 Other specified disorders of adrenal gland: Secondary | ICD-10-CM

## 2018-10-22 ENCOUNTER — Other Ambulatory Visit: Payer: Self-pay

## 2018-10-22 ENCOUNTER — Ambulatory Visit: Payer: Medicare Other

## 2018-10-22 ENCOUNTER — Ambulatory Visit
Admission: RE | Admit: 2018-10-22 | Discharge: 2018-10-22 | Disposition: A | Discharge status: Home / Self Care | Payer: Medicare Other | Source: Ambulatory Visit | Attending: Specialist | Admitting: Specialist

## 2018-10-22 DIAGNOSIS — E278 Other specified disorders of adrenal gland: Secondary | ICD-10-CM | POA: Insufficient documentation

## 2018-10-22 MED ORDER — GADOBUTROL 1 MMOL/ML IV SOLN
10.0000 mL | Freq: Once | INTRAVENOUS | Status: AC | PRN
  Administered 2018-10-22: 10 mL via INTRAVENOUS

## 2018-10-23 ENCOUNTER — Ambulatory Visit: Payer: Medicare Other

## 2018-10-24 ENCOUNTER — Other Ambulatory Visit: Payer: Self-pay | Admitting: Gastroenterology

## 2018-10-24 DIAGNOSIS — R1314 Dysphagia, pharyngoesophageal phase: Secondary | ICD-10-CM

## 2018-10-31 ENCOUNTER — Ambulatory Visit: Admission: RE | Admit: 2018-10-31 | Payer: Medicare Other | Source: Ambulatory Visit

## 2018-10-31 ENCOUNTER — Ambulatory Visit
Admission: RE | Admit: 2018-10-31 | Discharge: 2018-10-31 | Disposition: A | Discharge status: Home / Self Care | Payer: Medicare Other | Source: Ambulatory Visit | Attending: Gastroenterology | Admitting: Gastroenterology

## 2018-10-31 ENCOUNTER — Other Ambulatory Visit: Payer: Self-pay

## 2018-10-31 ENCOUNTER — Ambulatory Visit: Payer: Medicare Other

## 2018-10-31 ENCOUNTER — Other Ambulatory Visit: Payer: Medicare Other

## 2018-10-31 DIAGNOSIS — R1314 Dysphagia, pharyngoesophageal phase: Secondary | ICD-10-CM

## 2018-11-03 ENCOUNTER — Other Ambulatory Visit: Payer: Self-pay | Admitting: Specialist

## 2018-11-03 DIAGNOSIS — E278 Other specified disorders of adrenal gland: Secondary | ICD-10-CM

## 2018-11-04 ENCOUNTER — Other Ambulatory Visit: Payer: Self-pay | Admitting: Specialist

## 2018-11-04 DIAGNOSIS — E278 Other specified disorders of adrenal gland: Secondary | ICD-10-CM

## 2018-11-07 ENCOUNTER — Other Ambulatory Visit: Payer: Medicare Other

## 2018-11-17 ENCOUNTER — Other Ambulatory Visit: Payer: Self-pay

## 2018-11-17 ENCOUNTER — Ambulatory Visit
Admission: RE | Admit: 2018-11-17 | Discharge: 2018-11-17 | Disposition: A | Discharge status: Home / Self Care | Payer: Medicare Other | Source: Ambulatory Visit | Attending: Specialist | Admitting: Specialist

## 2018-11-17 DIAGNOSIS — I700 Atherosclerosis of aorta: Secondary | ICD-10-CM | POA: Diagnosis not present

## 2018-11-17 DIAGNOSIS — I2510 Atherosclerotic heart disease of native coronary artery without angina pectoris: Secondary | ICD-10-CM | POA: Insufficient documentation

## 2018-11-17 DIAGNOSIS — N200 Calculus of kidney: Secondary | ICD-10-CM | POA: Insufficient documentation

## 2018-11-17 DIAGNOSIS — E278 Other specified disorders of adrenal gland: Secondary | ICD-10-CM | POA: Diagnosis not present

## 2018-11-17 LAB — GLUCOSE, CAPILLARY: Glucose-Capillary: 97 mg/dL (ref 70–99)

## 2018-11-17 MED ORDER — FLUDEOXYGLUCOSE F - 18 (FDG) INJECTION
11.7700 | Freq: Once | INTRAVENOUS | Status: AC | PRN
  Administered 2018-11-17: 11.77 via INTRAVENOUS

## 2018-11-25 ENCOUNTER — Other Ambulatory Visit: Payer: Self-pay | Admitting: Specialist

## 2018-11-25 DIAGNOSIS — E278 Other specified disorders of adrenal gland: Secondary | ICD-10-CM

## 2018-11-26 ENCOUNTER — Other Ambulatory Visit: Payer: Self-pay | Admitting: Specialist

## 2018-11-26 DIAGNOSIS — N200 Calculus of kidney: Secondary | ICD-10-CM

## 2018-11-26 DIAGNOSIS — E278 Other specified disorders of adrenal gland: Secondary | ICD-10-CM

## 2019-07-01 ENCOUNTER — Other Ambulatory Visit: Payer: Self-pay | Admitting: Specialist

## 2019-07-01 DIAGNOSIS — R918 Other nonspecific abnormal finding of lung field: Secondary | ICD-10-CM

## 2019-08-28 ENCOUNTER — Ambulatory Visit
Admission: RE | Admit: 2019-08-28 | Discharge: 2019-08-28 | Disposition: A | Discharge status: Home / Self Care | Payer: Medicare Other | Source: Ambulatory Visit | Attending: Specialist | Admitting: Specialist

## 2019-08-28 ENCOUNTER — Other Ambulatory Visit: Payer: Self-pay

## 2019-08-28 DIAGNOSIS — R918 Other nonspecific abnormal finding of lung field: Secondary | ICD-10-CM

## 2020-03-09 ENCOUNTER — Other Ambulatory Visit: Payer: Self-pay | Admitting: Specialist

## 2020-03-09 DIAGNOSIS — E279 Disorder of adrenal gland, unspecified: Secondary | ICD-10-CM

## 2020-03-09 DIAGNOSIS — E278 Other specified disorders of adrenal gland: Secondary | ICD-10-CM

## 2020-03-14 ENCOUNTER — Other Ambulatory Visit: Payer: Self-pay | Admitting: Specialist

## 2020-03-14 DIAGNOSIS — E279 Disorder of adrenal gland, unspecified: Secondary | ICD-10-CM

## 2020-03-14 DIAGNOSIS — E278 Other specified disorders of adrenal gland: Secondary | ICD-10-CM

## 2020-03-16 ENCOUNTER — Other Ambulatory Visit: Payer: Self-pay | Admitting: Specialist

## 2020-03-16 DIAGNOSIS — E278 Other specified disorders of adrenal gland: Secondary | ICD-10-CM

## 2020-09-04 ENCOUNTER — Emergency Department
Admission: EM | Admit: 2020-09-04 | Discharge: 2020-09-04 | Disposition: A | Discharge status: Home / Self Care | Payer: Medicare Other | Attending: Emergency Medicine | Admitting: Emergency Medicine

## 2020-09-04 ENCOUNTER — Emergency Department: Payer: Medicare Other

## 2020-09-04 DIAGNOSIS — I4891 Unspecified atrial fibrillation: Secondary | ICD-10-CM | POA: Insufficient documentation

## 2020-09-04 DIAGNOSIS — Y906 Blood alcohol level of 120-199 mg/100 ml: Secondary | ICD-10-CM | POA: Diagnosis not present

## 2020-09-04 DIAGNOSIS — Y92511 Restaurant or cafe as the place of occurrence of the external cause: Secondary | ICD-10-CM | POA: Insufficient documentation

## 2020-09-04 DIAGNOSIS — J449 Chronic obstructive pulmonary disease, unspecified: Secondary | ICD-10-CM | POA: Diagnosis not present

## 2020-09-04 DIAGNOSIS — Z87891 Personal history of nicotine dependence: Secondary | ICD-10-CM | POA: Diagnosis not present

## 2020-09-04 DIAGNOSIS — Z7901 Long term (current) use of anticoagulants: Secondary | ICD-10-CM | POA: Insufficient documentation

## 2020-09-04 DIAGNOSIS — W07XXXA Fall from chair, initial encounter: Secondary | ICD-10-CM | POA: Insufficient documentation

## 2020-09-04 DIAGNOSIS — Z23 Encounter for immunization: Secondary | ICD-10-CM | POA: Diagnosis not present

## 2020-09-04 DIAGNOSIS — Z96653 Presence of artificial knee joint, bilateral: Secondary | ICD-10-CM | POA: Insufficient documentation

## 2020-09-04 DIAGNOSIS — S0990XA Unspecified injury of head, initial encounter: Secondary | ICD-10-CM

## 2020-09-04 DIAGNOSIS — I10 Essential (primary) hypertension: Secondary | ICD-10-CM | POA: Insufficient documentation

## 2020-09-04 DIAGNOSIS — F10129 Alcohol abuse with intoxication, unspecified: Secondary | ICD-10-CM | POA: Diagnosis not present

## 2020-09-04 DIAGNOSIS — F10920 Alcohol use, unspecified with intoxication, uncomplicated: Secondary | ICD-10-CM

## 2020-09-04 DIAGNOSIS — Z79899 Other long term (current) drug therapy: Secondary | ICD-10-CM | POA: Diagnosis not present

## 2020-09-04 DIAGNOSIS — S00412A Abrasion of left ear, initial encounter: Secondary | ICD-10-CM | POA: Insufficient documentation

## 2020-09-04 DIAGNOSIS — W19XXXA Unspecified fall, initial encounter: Secondary | ICD-10-CM

## 2020-09-04 DIAGNOSIS — Z96641 Presence of right artificial hip joint: Secondary | ICD-10-CM | POA: Insufficient documentation

## 2020-09-04 DIAGNOSIS — S0991XA Unspecified injury of ear, initial encounter: Secondary | ICD-10-CM | POA: Diagnosis present

## 2020-09-04 LAB — CBC
HCT: 42.4 % (ref 39.0–52.0)
Hemoglobin: 14.2 g/dL (ref 13.0–17.0)
MCH: 30.1 pg (ref 26.0–34.0)
MCHC: 33.5 g/dL (ref 30.0–36.0)
MCV: 89.8 fL (ref 80.0–100.0)
Platelets: 237 10*3/uL (ref 150–400)
RBC: 4.72 MIL/uL (ref 4.22–5.81)
RDW: 13.1 % (ref 11.5–15.5)
WBC: 10.4 10*3/uL (ref 4.0–10.5)
nRBC: 0 % (ref 0.0–0.2)

## 2020-09-04 LAB — BASIC METABOLIC PANEL
Anion gap: 15 (ref 5–15)
BUN: 19 mg/dL (ref 8–23)
CO2: 22 mmol/L (ref 22–32)
Calcium: 8.4 mg/dL — ABNORMAL LOW (ref 8.9–10.3)
Chloride: 97 mmol/L — ABNORMAL LOW (ref 98–111)
Creatinine, Ser: 1.07 mg/dL (ref 0.61–1.24)
GFR, Estimated: >60 mL/min (ref >60)
Glucose, Bld: 129 mg/dL — ABNORMAL HIGH (ref 70–99)
Potassium: 3.1 mmol/L — ABNORMAL LOW (ref 3.5–5.1)
Sodium: 134 mmol/L — ABNORMAL LOW (ref 135–145)

## 2020-09-04 LAB — HEPATIC FUNCTION PANEL
ALT: 20 U/L (ref 0–44)
AST: 25 U/L (ref 15–41)
Albumin: 3.7 g/dL (ref 3.5–5.0)
Alkaline Phosphatase: 66 U/L (ref 38–126)
Bilirubin, Direct: 0.1 mg/dL (ref 0.0–0.2)
Indirect Bilirubin: 0.7 mg/dL (ref 0.3–0.9)
Total Bilirubin: 0.8 mg/dL (ref 0.3–1.2)
Total Protein: 6.7 g/dL (ref 6.5–8.1)

## 2020-09-04 LAB — OCCULT BLOOD GASTRIC / DUODENUM (SPECIMEN CUP)
Occult Blood, Gastric: POSITIVE — AB
pH, Gastric: 2

## 2020-09-04 LAB — TROPONIN I (HIGH SENSITIVITY)
Troponin I (High Sensitivity): 38 ng/L — ABNORMAL HIGH (ref <18)
Troponin I (High Sensitivity): 42 ng/L — ABNORMAL HIGH (ref <18)

## 2020-09-04 LAB — CBG MONITORING, ED: Glucose-Capillary: 144 mg/dL — ABNORMAL HIGH (ref 70–99)

## 2020-09-04 LAB — ETHANOL: Alcohol, Ethyl (B): 195 mg/dL — ABNORMAL HIGH (ref <10)

## 2020-09-04 MED ORDER — POTASSIUM CHLORIDE CRYS ER 20 MEQ PO TBCR
40.0000 meq | EXTENDED_RELEASE_TABLET | Freq: Once | ORAL | Status: AC
  Administered 2020-09-04: 40 meq via ORAL
  Filled 2020-09-04: qty 2

## 2020-09-04 MED ORDER — ONDANSETRON HCL 4 MG/2ML IJ SOLN
4.0000 mg | Freq: Once | INTRAMUSCULAR | Status: AC
  Administered 2020-09-04: 4 mg via INTRAVENOUS
  Filled 2020-09-04: qty 2

## 2020-09-04 MED ORDER — SODIUM CHLORIDE 0.9 % IV SOLN: INTRAVENOUS | Status: DC

## 2020-09-04 MED ORDER — TETANUS-DIPHTH-ACELL PERTUSSIS 5-2.5-18.5 LF-MCG/0.5 IM SUSY
0.5000 mL | PREFILLED_SYRINGE | Freq: Once | INTRAMUSCULAR | Status: AC
  Administered 2020-09-04: 0.5 mL via INTRAMUSCULAR
  Filled 2020-09-04: qty 0.5

## 2020-09-04 MED ORDER — ONDANSETRON 4 MG PO TBDP: 4.0000 mg | ORAL_TABLET | Freq: Four times a day (QID) | ORAL | 0 refills | Status: DC | PRN

## 2020-09-04 NOTE — ED Notes (Signed)
Pt reports c-spine tenderness, c-collar placed at this time

## 2020-09-04 NOTE — ED Triage Notes (Addendum)
Pt to ED via EMS after falling at [place]. Per EMS, nobody on scene able to verify what pt hit when falling d/t all being intoxicated. Pt had 6 beers, very hard of hearing. BGL 111 for EMS. Unknown if on blood thinners or LOC. Pt able to state name and birthday at this time. Alert to voice. smalll lac noted to left ear. Denies pain

## 2020-09-04 NOTE — ED Notes (Signed)
Pt's daughter at bedside, reports pt on xarelto

## 2020-09-04 NOTE — ED Notes (Signed)
Pt in CT at this time.

## 2020-09-04 NOTE — ED Notes (Signed)
BGL 144

## 2020-09-04 NOTE — ED Provider Notes (Addendum)
[facility] Emergency Department Provider Note ____________________________________________   Event Date/Time   First MD Initiated Contact with Patient 09/04/20 0005     (approximate)  I have reviewed the triage vital signs and the nursing notes.   HISTORY  Chief Complaint Fall and Alcohol Intoxication    HPI Dennis Savage is a 87 y.o. male with history of atrial fibrillation on Xarelto, hypertension, COPD who presents to the emergency department with EMS after he had a fall.  His daughter is at the bedside and provides most of the history.  She states that they went to her daughters place of work tonight and had 6 or 7 beers.  She states that the patient told her he wanted to leave and when they got up to go he stood up off the barstool and stumbled and fell to the ground.  He has an abrasion to the left ear.  No known loss of consciousness.  Denies any pain currently but nurse states that he was complaining of some cervical spine pain so a c-collar was placed.  No other known injury.  Ambulates with a cane.  Unsure of last tetanus vaccine.  History limited as patient is intoxicated.  Daughter states that patient does not drink regularly.         Past Medical History:  Diagnosis Date  . Acute diarrhea 02/25/2015  . After cataract not obscuring vision 12/21/2011  . 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 Apnea, sleep 07/13/2015  . Arthralgia of multiple joints 06/15/2011  . Arthritis   . Artificial lens present 12/08/2010  . Atrial fibrillation ([place])   . Benign essential HTN 11/03/2010  . Chronic obstructive pulmonary disease ([place]) 11/03/2010  . CN (constipation) 06/15/2011  . 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 Dizziness 02/25/2015  . GERD (gastroesophageal reflux disease)   . Heart murmur   . Hypertension   . Interstitial lung disease ([place]) 10/13/2013  . LBP (low back pain) 11/03/2010  . 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 Peripheral vascular disease ([place])  11/03/2010  . Retinal hemorrhage 03/16/2014    Patient Active Problem List   Diagnosis Date Noted  . 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 Degenerative arthritis of hip 09/12/2015  . Atrial fibrillation ([place]) 07/13/2015  . Apnea, sleep 07/13/2015  . Acute diarrhea 02/25/2015  . Dizziness 02/25/2015  . 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 Lymphangioma, 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  . Retinal hemorrhage 03/16/2014  . Interstitial lung disease ([place]) 10/13/2013  . After cataract not obscuring vision 12/21/2011  . History of surgical procedure 12/21/2011  . CN (constipation) 06/15/2011  . Encounter for general adult medical examination without abnormal findings 06/15/2011  . Arthralgia of multiple joints 06/15/2011  . 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 Artificial lens present 12/08/2010  . 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 Benign essential HTN 11/03/2010  . Chronic obstructive pulmonary disease ([place]) 11/03/2010  . Benign hypertension 11/03/2010  . LBP (low back pain) 11/03/2010  . Peripheral vascular disease ([place]) 11/03/2010  . Current tobacco use 11/03/2010    Past Surgical History:  Procedure Laterality Date  . APPENDECTOMY    . BACK SURGERY    . CARPAL TUNNEL RELEASE Bilateral   . EYE SURGERY Bilateral    Cataract Extraction with IOL  . HERNIA REPAIR     Umbilical Hernia X 3  . JOINT REPLACEMENT     bilat knees  . NASAL SINUS SURGERY    . REPLACEMENT TOTAL KNEE Bilateral   . SHOULDER SURGERY Right   .  TONSILLECTOMY    . TOTAL HIP ARTHROPLASTY Right 10/12/2015   Procedure: TOTAL HIP ARTHROPLASTY;  Surgeon: Dereck Leep, MD;  Location: [facility] ORS;  Service: Orthopedics;  Laterality: Right;    Prior to Admission medications   Medication Sig Start Date End Date Taking? Authorizing Provider  ondansetron (ZOFRAN ODT) 4 MG disintegrating tablet Take 1 tablet (4 mg total) by mouth every 6 (six) hours as needed for nausea or vomiting. 09/04/20  Yes Chandria Rookstool, Delice Bison, DO   acetaminophen (TYLENOL) 325 MG tablet Take 650 mg by mouth every 6 (six) hours as needed.    [provider]  amLODipine (NORVASC) 10 MG tablet Take 10 mg by mouth daily.    [provider]  chlorthalidone (HYGROTON) 25 MG tablet Take 25 mg by mouth daily as needed.     [provider]  docusate sodium (COLACE) 250 MG capsule Take 250 mg by mouth daily as needed for constipation.    [provider]  metoprolol tartrate (LOPRESSOR) 25 MG tablet Take 25 mg by mouth. 10/22/12   [provider]  Multiple Vitamins-Minerals (PRESERVISION AREDS 2 PO) Take by mouth.    [provider]  Multiple Vitamins-Minerals (PRESERVISION AREDS 2) CAPS Take 2 capsules by mouth daily.    [provider]  predniSONE (DELTASONE) 10 MG tablet  03/22/17   [provider]  rivaroxaban (XARELTO) 20 MG TABS tablet Take 20 mg by mouth daily with supper.    [provider]  traMADol (ULTRAM) 50 MG tablet Take 1 tablet (50 mg total) by mouth every 6 (six) hours as needed for moderate pain. 06/06/17   Johnn Hai, PA-C    Allergies Penicillins  Family History  Problem Relation Age of Onset  . Bladder Cancer Neg Hx   . Kidney cancer Neg Hx   . Prostate cancer Neg Hx     Social History Social History   Tobacco Use  . Smoking status: Former Smoker    Packs/day: 1.00    Types: Cigarettes  . Smokeless tobacco: Never Used  Substance Use Topics  . Alcohol use: No  . Drug use: No    Review of Systems Level V Caveat due to intoxication   ____________________________________________   PHYSICAL EXAM:  VITAL SIGNS: ED Triage Vitals  Enc Vitals Group     BP 09/04/20 0013 139/71     Pulse Rate 09/04/20 0012 (!) 51     Resp 09/04/20 0012 16     Temp 09/04/20 0013 97.6 F (36.4 C)     Temp Source 09/04/20 0013 Oral     SpO2 09/04/20 0012 97 %     Weight 09/04/20 0013 212 lb (96.2 kg)     Height 09/04/20 0026 5\' 9"  (1.753 m)      Head Circumference --      Peak Flow --      Pain Score 09/04/20 0012 0     Pain Loc --      Pain Edu? --      Excl. in GC? --    CONSTITUTIONAL: Alert and oriented but very hard of hearing, intoxicated and drowsy.  Elderly, obese. HEAD: Normocephalic; small abrasion noted to the left earlobe without laceration EYES: Conjunctivae clear, PERRL, EOMI ENT: normal nose; no rhinorrhea; moist mucous membranes; pharynx without lesions noted; no dental injury; no septal hematoma NECK: Supple, no meningismus, no LAD; no midline spinal tenderness, step-off or deformity; trachea midline, cervical collar in place CARD: Irregularly irregular and slightly  bradycardic; S1 and S2 appreciated; no murmurs, no clicks, no rubs, no gallops RESP: Normal chest excursion without splinting or tachypnea; breath sounds clear and equal bilaterally; no wheezes, no rhonchi, no rales; no hypoxia or respiratory distress CHEST:  chest wall stable, no crepitus or ecchymosis or deformity, nontender to palpation; no flail chest ABD/GI: Normal bowel sounds; non-distended; soft, non-tender, no rebound, no guarding; no ecchymosis or other lesions noted PELVIS:  stable, nontender to palpation BACK:  The back appears normal and is non-tender to palpation, there is no CVA tenderness; no midline spinal tenderness, step-off or deformity EXT: Normal ROM in all joints; non-tender to palpation; no edema; normal capillary refill; no cyanosis, no bony tenderness or bony deformity of patient's extremities, no joint effusion, compartments are soft, extremities are warm and well-perfused, no ecchymosis SKIN: Normal color for age and race; warm NEURO: Moves all extremities equally, normal speech, no facial asymmetry PSYCH: The patient's mood and manner are appropriate. Grooming and personal hygiene are appropriate.  ____________________________________________   LABS (all labs ordered are listed, but only abnormal results are  displayed)  Labs Reviewed  BASIC METABOLIC PANEL - Abnormal; Notable for the following components:      Result Value   Sodium 134 (*)    Potassium 3.1 (*)    Chloride 97 (*)    Glucose, Bld 129 (*)    Calcium 8.4 (*)    All other components within normal limits  ETHANOL - Abnormal; Notable for the following components:   Alcohol, Ethyl (B) 195 (*)    All other components within normal limits  CBG MONITORING, ED - Abnormal; Notable for the following components:   Glucose-Capillary 144 (*)    All other components within normal limits  TROPONIN I (HIGH SENSITIVITY) - Abnormal; Notable for the following components:   Troponin I (High Sensitivity) 42 (*)    All other components within normal limits  TROPONIN I (HIGH SENSITIVITY) - Abnormal; Notable for the following components:   Troponin I (High Sensitivity) 38 (*)    All other components within normal limits  CBC  HEPATIC FUNCTION PANEL  URINALYSIS, COMPLETE (UACMP) WITH MICROSCOPIC   ____________________________________________  EKG   EKG Interpretation  Date/Time:  Sunday Sep 04 2020 00:20:08 EDT Ventricular Rate:  54 PR Interval:    QRS Duration: 185 QT Interval:  501 QTC Calculation: 475 R Axis:   13 Text Interpretation: Atrial fibrillation Right bundle branch block Confirmed by Pryor Curia [phone]) on 09/04/2020 12:40:41 AM       ____________________________________________  RADIOLOGY Jessie Foot Sabrina Keough, personally viewed and evaluated these images (plain radiographs) as part of my medical decision making, as well as reviewing the written report by the radiologist.  ED MD interpretation: CT head and cervical spine showed no acute abnormality.  Official radiology report(s): CT HEAD WO CONTRAST  Result Date: 09/04/2020 CLINICAL DATA:  Fall while on blood thinners. EXAM: CT HEAD WITHOUT CONTRAST CT CERVICAL SPINE WITHOUT CONTRAST TECHNIQUE: Multidetector CT imaging of the head and cervical spine was performed  following the standard protocol without intravenous contrast. Multiplanar CT image reconstructions of the cervical spine were also generated. COMPARISON:  None. FINDINGS: CT HEAD FINDINGS Brain: There is no mass, hemorrhage or extra-axial collection. Mildly enlarged CSF space over the right convexity, possibly due to chronic volume loss. The size and configuration of the ventricles and extra-axial CSF spaces are normal. The brain parenchyma is normal, without evidence of acute or chronic infarction. Vascular: No abnormal hyperdensity of the major  intracranial arteries or dural venous sinuses. No intracranial atherosclerosis. Skull: The visualized skull base, calvarium and extracranial soft tissues are normal. Sinuses/Orbits: Postsurgical changes with moderate mucosal thickening. The orbits are normal. CT CERVICAL SPINE FINDINGS Alignment: No static subluxation. Facets are aligned. Occipital condyles are normally positioned. Skull base and vertebrae: No acute fracture. Fusion of the C4-5 vertebrae. There is fusion of the right C2-3 and C4-5 facets. There is fusion of the left C4-5 facets. Soft tissues and spinal canal: No prevertebral fluid or swelling. No visible canal hematoma. Disc levels: No advanced spinal canal or neural foraminal stenosis. Upper chest: No pneumothorax, pulmonary nodule or pleural effusion. Other: Normal visualized paraspinal cervical soft tissues. IMPRESSION: 1. No acute intracranial abnormality. 2. No acute fracture or static subluxation of the cervical spine. Electronically Signed   By: Ulyses Jarred M.D.   On: 09/04/2020 00:55   CT Cervical Spine Wo Contrast  Result Date: 09/04/2020 CLINICAL DATA:  Fall while on blood thinners. EXAM: CT HEAD WITHOUT CONTRAST CT CERVICAL SPINE WITHOUT CONTRAST TECHNIQUE: Multidetector CT imaging of the head and cervical spine was performed following the standard protocol without intravenous contrast. Multiplanar CT image reconstructions of the cervical  spine were also generated. COMPARISON:  None. FINDINGS: CT HEAD FINDINGS Brain: There is no mass, hemorrhage or extra-axial collection. Mildly enlarged CSF space over the right convexity, possibly due to chronic volume loss. The size and configuration of the ventricles and extra-axial CSF spaces are normal. The brain parenchyma is normal, without evidence of acute or chronic infarction. Vascular: No abnormal hyperdensity of the major intracranial arteries or dural venous sinuses. No intracranial atherosclerosis. Skull: The visualized skull base, calvarium and extracranial soft tissues are normal. Sinuses/Orbits: Postsurgical changes with moderate mucosal thickening. The orbits are normal. CT CERVICAL SPINE FINDINGS Alignment: No static subluxation. Facets are aligned. Occipital condyles are normally positioned. Skull base and vertebrae: No acute fracture. Fusion of the C4-5 vertebrae. There is fusion of the right C2-3 and C4-5 facets. There is fusion of the left C4-5 facets. Soft tissues and spinal canal: No prevertebral fluid or swelling. No visible canal hematoma. Disc levels: No advanced spinal canal or neural foraminal stenosis. Upper chest: No pneumothorax, pulmonary nodule or pleural effusion. Other: Normal visualized paraspinal cervical soft tissues. IMPRESSION: 1. No acute intracranial abnormality. 2. No acute fracture or static subluxation of the cervical spine. Electronically Signed   By: Ulyses Jarred M.D.   On: 09/04/2020 00:55    ____________________________________________   PROCEDURES  Procedure(s) performed (including Critical Care):  Procedures   ____________________________________________   INITIAL IMPRESSION / ASSESSMENT AND PLAN / ED COURSE  As part of my medical decision making, I reviewed the following data within the [place] History obtained from family, Nursing notes reviewed and incorporated, Labs reviewed , EKG interpreted , Old EKG reviewed, Old chart  reviewed and Notes from prior ED visits         Patient here after fall at a bar tonight.  Was drinking alcohol.  Appears intoxicated, drowsy here.  He is on Xarelto for atrial fibrillation and did hit his head.  Will obtain CT of the head and cervical spine.  It sounds like this may have been a mechanical fall but is unclear given patient is not able to provide much history.  EKG shows atrial fibrillation which is chronic for him and mild bradycardia.  Otherwise hemodynamically stable.  No other sign of traumatic injury on exam.  Will update his tetanus vaccination.  Will obtain screening labs including troponins.  Will monitor until clinically sober.  ED PROGRESS  CT head and cervical spine show no acute abnormality.  Patient has an alcohol level of 195.  Troponin minimally elevated at 42 with no old for comparison.  EKG nonischemic.  He is now more awake, alert and denies any chest pain, shortness of breath.  Plan is to obtain second troponin to ensure no change.  Patient and daughter comfortable with plan although daughter is eager for discharge home.  2:20 AM  Pt actively vomiting.  Will give Zofran.   2:55 AM  Pt no longer vomiting.  Will p.o. challenge.  Repeat troponin is actually downtrending.  Will discharge home.   At this time, I do not feel there is any life-threatening condition present. I have reviewed, interpreted and discussed all results (EKG, imaging, lab, urine as appropriate) and exam findings with patient/family. I have reviewed nursing notes and appropriate previous records.  I feel the patient is safe to be discharged home without further emergent workup and can continue workup as an outpatient as needed. Discussed usual and customary return precautions. Patient/family verbalize understanding and are comfortable with this plan.  Outpatient follow-up has been provided as needed. All questions have been answered.  4:20 AM  Nurse reported that she was concerned patient had  coffee-ground emesis.  On my evaluation, emesis appears brown in nature without bright red blood.  It is gastric occult positive but he has not had any further vomiting and is tolerating p.o. here.  He is not complaining of any abdominal pain, distention, bloody stool or melena.  This could be a Mallory-Weiss tear from vomiting which I suspect is likely from alcohol intoxication.  He does not have coffee-ground emesis here in the emergency department.  I feel he is safe to be discharged home and have had a lengthy discussion with return precautions and what to look out for at home especially given he is on blood thinners.  Patient and daughter comfortable with this plan.  Patient also ambulate with steady gait. ____________________________________________   FINAL CLINICAL IMPRESSION(S) / ED DIAGNOSES  Final diagnoses:  Fall, initial encounter  Injury of head, initial encounter  Alcoholic intoxication without complication [facility])     ED Discharge Orders         Ordered    ondansetron (ZOFRAN ODT) 4 MG disintegrating tablet  Every 6 hours PRN        09/04/20 0255          *Please note:  Dennis Savage was evaluated in Emergency Department on 09/04/2020 for the symptoms described in the history of present illness. He was evaluated in the context of the global COVID-19 pandemic, which necessitated consideration that the patient might be at risk for infection with the SARS-CoV-2 virus that causes COVID-19. Institutional protocols and algorithms that pertain to the evaluation of patients at risk for COVID-19 are in a state of rapid change based on information released by regulatory bodies including the CDC and federal and state organizations. These policies and algorithms were followed during the patient's care in the ED.  Some ED evaluations and interventions may be delayed as a result of limited staffing during and the pandemic.*   Note:  This document was prepared using Dragon voice recognition  software and may include unintentional dictation errors.   Kiandria Clum, Delice Bison, DO 09/04/20 0256    Kotaro Buer, Delice Bison, DO 09/04/20 0422    Desarae Placide, Delice Bison, DO 09/04/20 [phone]

## 2020-09-04 NOTE — ED Notes (Signed)
Pt ambulatory around room with cane, pt and pt's daughter state they feel safe for discharge home. Pt tolerated PO, denied nausea. All paperwork reviewed with pt and pt's daughter with all questions answered. Pt assisted into pt's daughters car without incident.

## 2020-09-04 NOTE — ED Notes (Signed)
This nurse at [place] when visitor came out asking for emesis bag and reporting patient actively vomiting - this nurse to bedside with emesis bag and observed pt had began vomiting -- approx 400cc emesis noted in emesis bag (?coffee ground mixed in).  Dr Leonides Schanz notified of vomiting episode and Zofran subsequently ordered (see MAR for med administration)

## 2021-03-27 ENCOUNTER — Other Ambulatory Visit: Payer: Self-pay | Admitting: Gastroenterology

## 2021-03-27 DIAGNOSIS — R1314 Dysphagia, pharyngoesophageal phase: Secondary | ICD-10-CM

## 2021-03-27 DIAGNOSIS — K219 Gastro-esophageal reflux disease without esophagitis: Secondary | ICD-10-CM

## 2021-06-17 ENCOUNTER — Emergency Department: Payer: Medicare Other

## 2021-06-17 ENCOUNTER — Other Ambulatory Visit: Payer: Self-pay

## 2021-06-17 ENCOUNTER — Inpatient Hospital Stay
Admission: EM | Admit: 2021-06-17 | Discharge: 2021-06-23 | DRG: 193 | Disposition: A | Discharge status: Home / Self Care | Payer: Medicare Other | Attending: Internal Medicine | Admitting: Internal Medicine

## 2021-06-17 ENCOUNTER — Encounter: Payer: Self-pay | Admitting: Emergency Medicine

## 2021-06-17 DIAGNOSIS — E278 Other specified disorders of adrenal gland: Secondary | ICD-10-CM | POA: Diagnosis not present

## 2021-06-17 DIAGNOSIS — I4891 Unspecified atrial fibrillation: Secondary | ICD-10-CM | POA: Diagnosis present

## 2021-06-17 DIAGNOSIS — R778 Other specified abnormalities of plasma proteins: Secondary | ICD-10-CM | POA: Diagnosis present

## 2021-06-17 DIAGNOSIS — Z96641 Presence of right artificial hip joint: Secondary | ICD-10-CM | POA: Diagnosis present

## 2021-06-17 DIAGNOSIS — Z87891 Personal history of nicotine dependence: Secondary | ICD-10-CM | POA: Diagnosis not present

## 2021-06-17 DIAGNOSIS — I248 Other forms of acute ischemic heart disease: Secondary | ICD-10-CM | POA: Diagnosis present

## 2021-06-17 DIAGNOSIS — J918 Pleural effusion in other conditions classified elsewhere: Secondary | ICD-10-CM | POA: Diagnosis present

## 2021-06-17 DIAGNOSIS — Z888 Allergy status to other drugs, medicaments and biological substances status: Secondary | ICD-10-CM

## 2021-06-17 DIAGNOSIS — I739 Peripheral vascular disease, unspecified: Secondary | ICD-10-CM | POA: Diagnosis present

## 2021-06-17 DIAGNOSIS — E669 Obesity, unspecified: Secondary | ICD-10-CM | POA: Diagnosis present

## 2021-06-17 DIAGNOSIS — N179 Acute kidney failure, unspecified: Secondary | ICD-10-CM | POA: Diagnosis present

## 2021-06-17 DIAGNOSIS — J90 Pleural effusion, not elsewhere classified: Secondary | ICD-10-CM | POA: Diagnosis not present

## 2021-06-17 DIAGNOSIS — G4730 Sleep apnea, unspecified: Secondary | ICD-10-CM | POA: Diagnosis present

## 2021-06-17 DIAGNOSIS — Z20822 Contact with and (suspected) exposure to covid-19: Secondary | ICD-10-CM | POA: Diagnosis present

## 2021-06-17 DIAGNOSIS — I10 Essential (primary) hypertension: Secondary | ICD-10-CM

## 2021-06-17 DIAGNOSIS — N189 Chronic kidney disease, unspecified: Secondary | ICD-10-CM | POA: Diagnosis not present

## 2021-06-17 DIAGNOSIS — I130 Hypertensive heart and chronic kidney disease with heart failure and stage 1 through stage 4 chronic kidney disease, or unspecified chronic kidney disease: Secondary | ICD-10-CM | POA: Diagnosis present

## 2021-06-17 DIAGNOSIS — R0602 Shortness of breath: Secondary | ICD-10-CM

## 2021-06-17 DIAGNOSIS — Z96653 Presence of artificial knee joint, bilateral: Secondary | ICD-10-CM | POA: Diagnosis present

## 2021-06-17 DIAGNOSIS — N182 Chronic kidney disease, stage 2 (mild): Secondary | ICD-10-CM | POA: Diagnosis present

## 2021-06-17 DIAGNOSIS — I4510 Unspecified right bundle-branch block: Secondary | ICD-10-CM | POA: Diagnosis present

## 2021-06-17 DIAGNOSIS — Z9889 Other specified postprocedural states: Secondary | ICD-10-CM

## 2021-06-17 DIAGNOSIS — Z79899 Other long term (current) drug therapy: Secondary | ICD-10-CM

## 2021-06-17 DIAGNOSIS — Z880 Allergy status to penicillin: Secondary | ICD-10-CM | POA: Diagnosis not present

## 2021-06-17 DIAGNOSIS — E279 Disorder of adrenal gland, unspecified: Secondary | ICD-10-CM | POA: Diagnosis present

## 2021-06-17 DIAGNOSIS — K219 Gastro-esophageal reflux disease without esophagitis: Secondary | ICD-10-CM | POA: Diagnosis present

## 2021-06-17 DIAGNOSIS — I509 Heart failure, unspecified: Secondary | ICD-10-CM | POA: Diagnosis present

## 2021-06-17 DIAGNOSIS — Z7901 Long term (current) use of anticoagulants: Secondary | ICD-10-CM

## 2021-06-17 DIAGNOSIS — J849 Interstitial pulmonary disease, unspecified: Secondary | ICD-10-CM | POA: Diagnosis not present

## 2021-06-17 DIAGNOSIS — I482 Chronic atrial fibrillation, unspecified: Secondary | ICD-10-CM

## 2021-06-17 DIAGNOSIS — J440 Chronic obstructive pulmonary disease with acute lower respiratory infection: Secondary | ICD-10-CM | POA: Diagnosis present

## 2021-06-17 DIAGNOSIS — J441 Chronic obstructive pulmonary disease with (acute) exacerbation: Secondary | ICD-10-CM

## 2021-06-17 DIAGNOSIS — J189 Pneumonia, unspecified organism: Secondary | ICD-10-CM | POA: Diagnosis present

## 2021-06-17 DIAGNOSIS — R7989 Other specified abnormal findings of blood chemistry: Secondary | ICD-10-CM | POA: Diagnosis present

## 2021-06-17 DIAGNOSIS — G4733 Obstructive sleep apnea (adult) (pediatric): Secondary | ICD-10-CM | POA: Diagnosis not present

## 2021-06-17 DIAGNOSIS — J449 Chronic obstructive pulmonary disease, unspecified: Secondary | ICD-10-CM | POA: Diagnosis present

## 2021-06-17 DIAGNOSIS — I5033 Acute on chronic diastolic (congestive) heart failure: Secondary | ICD-10-CM | POA: Diagnosis present

## 2021-06-17 DIAGNOSIS — Z6830 Body mass index (BMI) 30.0-30.9, adult: Secondary | ICD-10-CM

## 2021-06-17 LAB — URINALYSIS, COMPLETE (UACMP) WITH MICROSCOPIC
Bilirubin Urine: NEGATIVE
Glucose, UA: NEGATIVE mg/dL
Hgb urine dipstick: NEGATIVE
Ketones, ur: NEGATIVE mg/dL
Leukocytes,Ua: NEGATIVE
Nitrite: NEGATIVE
Protein, ur: >=300 mg/dL — AB
Specific Gravity, Urine: 1.018 (ref 1.005–1.030)
pH: 5 (ref 5.0–8.0)

## 2021-06-17 LAB — CBC WITH DIFFERENTIAL/PLATELET
Abs Immature Granulocytes: 0.07 10*3/uL (ref 0.00–0.07)
Basophils Absolute: 0 10*3/uL (ref 0.0–0.1)
Basophils Relative: 0 %
Eosinophils Absolute: 0 10*3/uL (ref 0.0–0.5)
Eosinophils Relative: 0 %
HCT: 46.6 % (ref 39.0–52.0)
Hemoglobin: 15 g/dL (ref 13.0–17.0)
Immature Granulocytes: 1 %
Lymphocytes Relative: 18 %
Lymphs Abs: 2.5 10*3/uL (ref 0.7–4.0)
MCH: 29 pg (ref 26.0–34.0)
MCHC: 32.2 g/dL (ref 30.0–36.0)
MCV: 90.1 fL (ref 80.0–100.0)
Monocytes Absolute: 1.2 10*3/uL — ABNORMAL HIGH (ref 0.1–1.0)
Monocytes Relative: 9 %
Neutro Abs: 9.8 10*3/uL — ABNORMAL HIGH (ref 1.7–7.7)
Neutrophils Relative %: 72 %
Platelets: 194 10*3/uL (ref 150–400)
RBC: 5.17 MIL/uL (ref 4.22–5.81)
RDW: 14.1 % (ref 11.5–15.5)
WBC: 13.6 10*3/uL — ABNORMAL HIGH (ref 4.0–10.5)
nRBC: 0 % (ref 0.0–0.2)

## 2021-06-17 LAB — COMPREHENSIVE METABOLIC PANEL
ALT: 19 U/L (ref 0–44)
AST: 29 U/L (ref 15–41)
Albumin: 3.3 g/dL — ABNORMAL LOW (ref 3.5–5.0)
Alkaline Phosphatase: 63 U/L (ref 38–126)
Anion gap: 9 (ref 5–15)
BUN: 28 mg/dL — ABNORMAL HIGH (ref 8–23)
CO2: 26 mmol/L (ref 22–32)
Calcium: 9 mg/dL (ref 8.9–10.3)
Chloride: 101 mmol/L (ref 98–111)
Creatinine, Ser: 1.45 mg/dL — ABNORMAL HIGH (ref 0.61–1.24)
GFR, Estimated: 46 mL/min — ABNORMAL LOW (ref >60)
Glucose, Bld: 123 mg/dL — ABNORMAL HIGH (ref 70–99)
Potassium: 3.7 mmol/L (ref 3.5–5.1)
Sodium: 136 mmol/L (ref 135–145)
Total Bilirubin: 1.8 mg/dL — ABNORMAL HIGH (ref 0.3–1.2)
Total Protein: 7.2 g/dL (ref 6.5–8.1)

## 2021-06-17 LAB — TROPONIN I (HIGH SENSITIVITY): Troponin I (High Sensitivity): 74 ng/L — ABNORMAL HIGH (ref <18)

## 2021-06-17 LAB — RESP PANEL BY RT-PCR (FLU A&B, COVID) ARPGX2
Influenza A by PCR: NEGATIVE
Influenza B by PCR: NEGATIVE
SARS Coronavirus 2 by RT PCR: NEGATIVE

## 2021-06-17 LAB — LACTIC ACID, PLASMA: Lactic Acid, Venous: 1.2 mmol/L (ref 0.5–1.9)

## 2021-06-17 LAB — BRAIN NATRIURETIC PEPTIDE: B Natriuretic Peptide: 453.6 pg/mL — ABNORMAL HIGH (ref 0.0–100.0)

## 2021-06-17 MED ORDER — LEVOFLOXACIN IN D5W 750 MG/150ML IV SOLN
750.0000 mg | INTRAVENOUS | Status: DC
  Filled 2021-06-17: qty 150

## 2021-06-17 MED ORDER — FUROSEMIDE 10 MG/ML IJ SOLN
60.0000 mg | Freq: Once | INTRAMUSCULAR | Status: AC
  Administered 2021-06-17: 60 mg via INTRAVENOUS
  Filled 2021-06-17: qty 8

## 2021-06-17 MED ORDER — AMLODIPINE BESYLATE 10 MG PO TABS
10.0000 mg | ORAL_TABLET | Freq: Every day | ORAL | Status: DC
  Administered 2021-06-17 – 2021-06-23 (×7): 10 mg via ORAL
  Filled 2021-06-17: qty 1
  Filled 2021-06-17 (×2): qty 2
  Filled 2021-06-17 (×5): qty 1

## 2021-06-17 MED ORDER — IRBESARTAN 75 MG PO TABS
75.0000 mg | ORAL_TABLET | Freq: Every day | ORAL | Status: DC
  Filled 2021-06-17: qty 1

## 2021-06-17 MED ORDER — RIVAROXABAN 20 MG PO TABS
20.0000 mg | ORAL_TABLET | Freq: Every day | ORAL | Status: DC
  Administered 2021-06-17: 20 mg via ORAL
  Filled 2021-06-17 (×2): qty 1

## 2021-06-17 MED ORDER — ACETAMINOPHEN 325 MG PO TABS
650.0000 mg | ORAL_TABLET | ORAL | Status: DC | PRN
  Administered 2021-06-17 – 2021-06-22 (×6): 650 mg via ORAL
  Filled 2021-06-17 (×6): qty 2

## 2021-06-17 MED ORDER — SODIUM CHLORIDE 0.9 % IV SOLN: 250.0000 mL | INTRAVENOUS | Status: DC | PRN

## 2021-06-17 MED ORDER — LEVOFLOXACIN IN D5W 750 MG/150ML IV SOLN
750.0000 mg | Freq: Once | INTRAVENOUS | Status: AC
  Administered 2021-06-17: 750 mg via INTRAVENOUS
  Filled 2021-06-17: qty 150

## 2021-06-17 MED ORDER — DOCUSATE SODIUM 50 MG PO CAPS
250.0000 mg | ORAL_CAPSULE | Freq: Every day | ORAL | Status: DC | PRN
  Filled 2021-06-17: qty 1

## 2021-06-17 MED ORDER — ONDANSETRON HCL 4 MG/2ML IJ SOLN
4.0000 mg | Freq: Once | INTRAMUSCULAR | Status: AC
Start: 2021-06-17 — End: 2021-06-17
  Administered 2021-06-17: 4 mg via INTRAVENOUS
  Filled 2021-06-17: qty 2

## 2021-06-17 MED ORDER — FUROSEMIDE 10 MG/ML IJ SOLN
60.0000 mg | INTRAMUSCULAR | Status: DC
  Administered 2021-06-18 (×2): 60 mg via INTRAVENOUS
  Filled 2021-06-17 (×2): qty 6

## 2021-06-17 MED ORDER — OCUVITE-LUTEIN PO CAPS
1.0000 | ORAL_CAPSULE | Freq: Every day | ORAL | Status: DC
  Administered 2021-06-18 – 2021-06-23 (×6): 1 via ORAL
  Filled 2021-06-17 (×6): qty 1

## 2021-06-17 MED ORDER — SODIUM CHLORIDE 0.9% FLUSH
3.0000 mL | Freq: Two times a day (BID) | INTRAVENOUS | Status: DC
  Administered 2021-06-18 – 2021-06-23 (×12): 3 mL via INTRAVENOUS

## 2021-06-17 MED ORDER — IPRATROPIUM-ALBUTEROL 0.5-2.5 (3) MG/3ML IN SOLN: 3.0000 mL | RESPIRATORY_TRACT | Status: DC | PRN

## 2021-06-17 MED ORDER — ONDANSETRON HCL 4 MG/2ML IJ SOLN: 4.0000 mg | Freq: Four times a day (QID) | INTRAMUSCULAR | Status: DC | PRN

## 2021-06-17 MED ORDER — SODIUM CHLORIDE 0.9% FLUSH: 3.0000 mL | INTRAVENOUS | Status: DC | PRN

## 2021-06-17 MED ORDER — PRESERVISION AREDS 2 PO CAPS: ORAL_CAPSULE | Freq: Every day | ORAL | Status: DC

## 2021-06-17 MED ORDER — METOPROLOL TARTRATE 25 MG PO TABS
12.5000 mg | ORAL_TABLET | Freq: Two times a day (BID) | ORAL | Status: DC
  Administered 2021-06-17: 12.5 mg via ORAL
  Filled 2021-06-17: qty 1

## 2021-06-17 MED ORDER — METHYLPREDNISOLONE SODIUM SUCC 125 MG IJ SOLR
125.0000 mg | Freq: Once | INTRAMUSCULAR | Status: AC
  Administered 2021-06-17: 125 mg via INTRAVENOUS
  Filled 2021-06-17: qty 2

## 2021-06-17 MED ORDER — ONDANSETRON 4 MG PO TBDP
4.0000 mg | ORAL_TABLET | Freq: Four times a day (QID) | ORAL | Status: DC | PRN
  Filled 2021-06-17: qty 1

## 2021-06-17 NOTE — H&P (Signed)
HISTORY AND PHYSICAL  Patient: Dennis Savage 88 y.o. male MRN: [id number]  Today is hospital day 0 after admission on 06/17/2021  3:38 PM  RECORD [place] COURSE: Dennis Savage is a 88 y.o. male with a past medical history of atrial fibrillation on Xarelto, hypertension, who presents to the emergency department for shortness of breath.  According to the patient he was recently started on a fluid pill for some fluid accumulation in the legs which he states has gone down.  Patient states her last several days he has had worsening shortness of breath, worse today.  Patient states slight chest tightness but denies any "chest pain."  Patient states shortness of breath with cough and occasional clear sputum production.  Patient states he felt like he had a fever earlier today but EMS checked as well as in the hospital here and he has not had a fever as of yet. In ED, CXR concerning for pulmonary edema, BNP elevated, troponin slight elevation, EKG (+)Afib, no chest pain. Started diuresis, supplemental O2, abx   Procedures and Significant Results:  In ED 06/17/21 Mild/mod elevated BNP 453, mild elevated troponin at 74, Cr slightly above baseline at 1.45 GFR 46, slight elevation WBC to 13.6.   Consultants:  none    SUBJECTIVE:  Pt confirms above history. Reports biggest complaint at this time is he's thirsty. No pain. SOB improved in ED on O2. Daughter [name] at bedside.      ASSESSMENT & PLAN  Acute CHF (congestive heart failure) (HCC) New diagnosis w/ multiple risk factors Diuresis ongoing, I&O Echo pending Restarted BB on lower dose Holding off on adding ARB at least for now given AKI  COPD with exacerbation (HCC) Uncertain subjective fever, questionable COPD exacerbation/CAP in addition to CHF, received abx in ED, will continue for now but would d/c if repeat CXR improved / no infiltrate DuoNeb and steroids ordered - would d/c steroids and levaquin if improved  respiratory status w/ diuresis O2 support prn to titrate down as able   Interstitial lung disease (HCC) Known COPD, former smoker  O2 support prn to titrate down as able   Atrial fibrillation ([place]) Stable, continue home meds, restart BB (was not taking at home) Anticoag w/ Xarelto   Benign essential HTN Stable Continue home meds, restart BB, consider add ARB if renal fxn improves  Apnea, sleep CPAP ordered  AKI (acute kidney injury) ([place]) monitor BMP w/ diuresis  Holding off on adding ARB at least for now    VTE Ppx: continue home Xarelto  CODE STATUS   Code Status: Full Code Admitted from: home Expected Dispo: home Barriers to discharge: ongoing medical treatment  Family communication: daughter at bedside in ED, all questions answered               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   Apnea, sleep 07/13/2015   Arthralgia of multiple joints 06/15/2011   Arthritis    Artificial lens present 12/08/2010   Atrial fibrillation ([place])    Benign essential HTN 11/03/2010   Chronic obstructive pulmonary disease ([place]) 11/03/2010   CN (constipation) 06/15/2011   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   Dizziness 02/25/2015   GERD (gastroesophageal reflux disease)    Heart murmur    Hypertension    Interstitial lung disease ([place]) 10/13/2013   LBP (low back pain) 11/03/2010   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   Peripheral vascular disease ([place]) 11/03/2010   Retinal hemorrhage 03/16/2014    Past Surgical History:  Procedure Laterality Date   APPENDECTOMY     BACK SURGERY     CARPAL TUNNEL RELEASE Bilateral    EYE SURGERY Bilateral    Cataract Extraction with IOL   HERNIA REPAIR     Umbilical Hernia X 3   JOINT REPLACEMENT     bilat knees   NASAL SINUS SURGERY     REPLACEMENT TOTAL KNEE Bilateral    SHOULDER SURGERY Right    TONSILLECTOMY     TOTAL HIP  ARTHROPLASTY Right 10/12/2015   Procedure: TOTAL HIP ARTHROPLASTY;  Surgeon: Dereck Leep, MD;  Location: [facility] ORS;  Service: Orthopedics;  Laterality: Right;    Family History  Problem Relation Age of Onset   Bladder Cancer Neg Hx    Kidney cancer Neg Hx    Prostate cancer Neg Hx    Social History:  reports that he has quit smoking. His smoking use included cigarettes. He smoked an average of 1 pack per day. He has never used smokeless tobacco. He reports that he does not drink alcohol and does not use drugs.  Allergies:  Allergies  Allergen Reactions   Lisinopril Cough   Penicillins Hives and Swelling    Has patient had a PCN reaction causing immediate rash, facial/tongue/throat swelling, SOB or lightheadedness with hypotension: yES Has patient had a PCN reaction causing severe rash involving mucus membranes or skin necrosis: UNKNOWN Has patient had a PCN reaction that required hospitalization NO Has patient had a PCN reaction occurring within the last 10 years: nO If all of the above answers are "NO", then may proceed with Cephalosporin use.   No current facility-administered medications on file prior to encounter.   Current Outpatient Medications on File Prior to Encounter  Medication Sig Dispense Refill   amLODipine (NORVASC) 10 MG tablet Take 10 mg by mouth daily.     chlorthalidone (HYGROTON) 25 MG tablet Take 25 mg by mouth daily as needed.      docusate sodium (COLACE) 250 MG capsule Take 250 mg by mouth daily as needed for constipation.     Multiple Vitamins-Minerals (PRESERVISION AREDS 2 PO) Take by mouth.     rivaroxaban (XARELTO) 20 MG TABS tablet Take 20 mg by mouth daily with supper.     acetaminophen (TYLENOL) 325 MG tablet Take 650 mg by mouth every 6 (six) hours as needed.     metoprolol tartrate (LOPRESSOR) 25 MG tablet Take 25 mg by mouth. (Patient not taking: Reported on 06/17/2021)     ondansetron (ZOFRAN ODT) 4 MG disintegrating tablet Take 1 tablet (4 mg  total) by mouth every 6 (six) hours as needed for nausea or vomiting. (Patient not taking: Reported on 06/17/2021) 20 tablet 0   predniSONE (DELTASONE) 10 MG tablet  (Patient not taking: Reported on 06/17/2021)     traMADol (ULTRAM) 50 MG tablet Take 1 tablet (50 mg total) by mouth every 6 (six) hours as needed for moderate pain. (Patient not taking: Reported on 06/17/2021) 20 tablet 0    (Not in a hospital admission)  No current facility-administered medications on file prior to encounter.   Current Outpatient Medications on File Prior to Encounter  Medication Sig Dispense Refill   amLODipine (NORVASC) 10 MG tablet Take 10 mg by mouth daily.     chlorthalidone (HYGROTON) 25 MG tablet Take 25 mg by mouth daily as needed.  docusate sodium (COLACE) 250 MG capsule Take 250 mg by mouth daily as needed for constipation.     Multiple Vitamins-Minerals (PRESERVISION AREDS 2 PO) Take by mouth.     rivaroxaban (XARELTO) 20 MG TABS tablet Take 20 mg by mouth daily with supper.     acetaminophen (TYLENOL) 325 MG tablet Take 650 mg by mouth every 6 (six) hours as needed.     metoprolol tartrate (LOPRESSOR) 25 MG tablet Take 25 mg by mouth. (Patient not taking: Reported on 06/17/2021)     Multiple Vitamins-Minerals (PRESERVISION AREDS 2) CAPS Take 2 capsules by mouth daily.     ondansetron (ZOFRAN ODT) 4 MG disintegrating tablet Take 1 tablet (4 mg total) by mouth every 6 (six) hours as needed for nausea or vomiting. (Patient not taking: Reported on 06/17/2021) 20 tablet 0   predniSONE (DELTASONE) 10 MG tablet  (Patient not taking: Reported on 06/17/2021)     traMADol (ULTRAM) 50 MG tablet Take 1 tablet (50 mg total) by mouth every 6 (six) hours as needed for moderate pain. (Patient not taking: Reported on 06/17/2021) 20 tablet 0     Results for orders placed or performed during the hospital encounter of 06/17/21 (from the past 48 hour(s))  CBC with Differential     Status: Abnormal   Collection Time: 06/17/21   3:57 PM  Result Value Ref Range   WBC 13.6 (H) 4.0 - 10.5 K/uL   RBC 5.17 4.22 - 5.81 MIL/uL   Hemoglobin 15.0 13.0 - 17.0 g/dL   HCT 46.6 39.0 - 52.0 %   MCV 90.1 80.0 - 100.0 fL   MCH 29.0 26.0 - 34.0 pg   MCHC 32.2 30.0 - 36.0 g/dL   RDW 14.1 11.5 - 15.5 %   Platelets 194 150 - 400 K/uL   nRBC 0.0 0.0 - 0.2 %   Neutrophils Relative % 72 %   Neutro Abs 9.8 (H) 1.7 - 7.7 K/uL   Lymphocytes Relative 18 %   Lymphs Abs 2.5 0.7 - 4.0 K/uL   Monocytes Relative 9 %   Monocytes Absolute 1.2 (H) 0.1 - 1.0 K/uL   Eosinophils Relative 0 %   Eosinophils Absolute 0.0 0.0 - 0.5 K/uL   Basophils Relative 0 %   Basophils Absolute 0.0 0.0 - 0.1 K/uL   Immature Granulocytes 1 %   Abs Immature Granulocytes 0.07 0.00 - 0.07 K/uL    Comment: Performed at [facility], [address] [place], [place] [address]  Comprehensive metabolic panel     Status: Abnormal   Collection Time: 06/17/21  3:57 PM  Result Value Ref Range   Sodium 136 135 - 145 mmol/L   Potassium 3.7 3.5 - 5.1 mmol/L    Comment: HEMOLYSIS AT THIS LEVEL MAY AFFECT RESULT   Chloride 101 98 - 111 mmol/L   CO2 26 22 - 32 mmol/L   Glucose, Bld 123 (H) 70 - 99 mg/dL    Comment: Glucose reference range applies only to samples taken after fasting for at least 8 hours.   BUN 28 (H) 8 - 23 mg/dL   Creatinine, Ser 1.45 (H) 0.61 - 1.24 mg/dL   Calcium 9.0 8.9 - 10.3 mg/dL   Total Protein 7.2 6.5 - 8.1 g/dL   Albumin 3.3 (L) 3.5 - 5.0 g/dL   AST 29 15 - 41 U/L   ALT 19 0 - 44 U/L   Alkaline Phosphatase 63 38 - 126 U/L   Total Bilirubin 1.8 (H) 0.3 -  1.2 mg/dL   GFR, Estimated 46 (L) >60 mL/min    Comment: (NOTE) Calculated using the CKD-EPI Creatinine Equation (2021)    Anion gap 9 5 - 15    Comment: Performed at [facility], [place], [place] [address]  Troponin I (High Sensitivity)     Status: Abnormal   Collection Time: 06/17/21  3:57 PM  Result Value Ref Range   Troponin I (High  Sensitivity) 74 (H) <18 ng/L    Comment: (NOTE) Elevated high sensitivity troponin I (hsTnI) values and significant  changes across serial measurements may suggest ACS but many other  chronic and acute conditions are known to elevate hsTnI results.  Refer to the "Links" section for chest pain algorithms and additional  guidance. Performed at [facility], [address] [place], [place] [address]   Brain natriuretic peptide     Status: Abnormal   Collection Time: 06/17/21  3:57 PM  Result Value Ref Range   B Natriuretic Peptide 453.6 (H) 0.0 - 100.0 pg/mL    Comment: Performed at [facility], [address] [place], [place] [address]  Resp Panel by RT-PCR (Flu A&B, Covid) Nasopharyngeal Swab     Status: None   Collection Time: 06/17/21  3:57 PM   Specimen: Nasopharyngeal Swab; Nasopharyngeal(NP) swabs in vial transport medium  Result Value Ref Range   SARS Coronavirus 2 by RT PCR NEGATIVE NEGATIVE    Comment: (NOTE) SARS-CoV-2 target nucleic acids are NOT DETECTED.  The SARS-CoV-2 RNA is generally detectable in upper respiratory specimens during the acute phase of infection. The lowest concentration of SARS-CoV-2 viral copies this assay can detect is 138 copies/mL. A negative result does not preclude SARS-Cov-2 infection and should not be used as the sole basis for treatment or other patient management decisions. A negative result may occur with  improper specimen collection/handling, submission of specimen other than nasopharyngeal swab, presence of viral mutation(s) within the areas targeted by this assay, and inadequate number of viral copies(<138 copies/mL). A negative result must be combined with clinical observations, patient history, and epidemiological information. The expected result is Negative.  Fact Sheet for Patients:  EntrepreneurPulse.com.au  Fact Sheet for Healthcare Providers:   IncredibleEmployment.be  This test is no t yet approved or cleared by the [place] FDA and  has been authorized for detection and/or diagnosis of SARS-CoV-2 by FDA under an Emergency Use Authorization (EUA). This EUA will remain  in effect (meaning this test can be used) for the duration of the COVID-19 declaration under Section 564(b)(1) of the Act, 21 U.S.C.section 360bbb-3(b)(1), unless the authorization is terminated  or revoked sooner.       Influenza A by PCR NEGATIVE NEGATIVE   Influenza B by PCR NEGATIVE NEGATIVE    Comment: (NOTE) The Xpert Xpress SARS-CoV-2/FLU/RSV plus assay is intended as an aid in the diagnosis of influenza from Nasopharyngeal swab specimens and should not be used as a sole basis for treatment. Nasal washings and aspirates are unacceptable for Xpert Xpress SARS-CoV-2/FLU/RSV testing.  Fact Sheet for Patients: EntrepreneurPulse.com.au  Fact Sheet for Healthcare Providers: IncredibleEmployment.be  This test is not yet approved or cleared by the [place] FDA and has been authorized for detection and/or diagnosis of SARS-CoV-2 by FDA under an Emergency Use Authorization (EUA). This EUA will remain in effect (meaning this test can be used) for the duration of the COVID-19 declaration under Section 564(b)(1) of the Act, 21 U.S.C. section 360bbb-3(b)(1), unless the authorization is terminated  or revoked.  Performed at [facility], [address] [place], [place] [address]   Urinalysis, Complete w Microscopic In/Out Cath Urine     Status: Abnormal   Collection Time: 06/17/21  5:42 PM  Result Value Ref Range   Color, Urine YELLOW (A) YELLOW   APPearance CLOUDY (A) CLEAR   Specific Gravity, Urine 1.018 1.005 - 1.030   pH 5.0 5.0 - 8.0   Glucose, UA NEGATIVE NEGATIVE mg/dL   Hgb urine dipstick NEGATIVE NEGATIVE   Bilirubin Urine NEGATIVE NEGATIVE   Ketones, ur NEGATIVE  NEGATIVE mg/dL   Protein, ur >=300 (A) NEGATIVE mg/dL   Nitrite NEGATIVE NEGATIVE   Leukocytes,Ua NEGATIVE NEGATIVE   RBC / HPF 0-5 0 - 5 RBC/hpf   WBC, UA 0-5 0 - 5 WBC/hpf   Bacteria, UA RARE (A) NONE SEEN   Squamous Epithelial / LPF 0-5 0 - 5   Mucus PRESENT     Comment: Performed at [facility], [address] [place], [place] [address]   DG Chest Portable 1 View  Result Date: 06/17/2021 CLINICAL DATA:  SOB EXAM: PORTABLE CHEST 1 VIEW COMPARISON:  May 2021 FINDINGS: The cardiomediastinal silhouette is unchanged and enlarged in contour.Perihilar vascular congestion. Likely small LEFT pleural effusion. Heterogeneous LEFT retrocardiac opacity. Mild interstitial prominence. Visualized abdomen is unremarkable. IMPRESSION: 1. Heterogeneous LEFT retrocardiac opacity. Differential considerations include infection, aspiration or atelectasis. 2. There is a background of likely pulmonary edema with a small LEFT pleural effusion Followup PA and lateral chest X-ray is recommended in 3-4 weeks following trial of antibiotic therapy to ensure resolution and exclude underlying malignancy. Electronically Signed   By: Valentino Saxon M.D.   On: 06/17/2021 16:16    Review of Systems  Constitutional:  Positive for chills. Negative for activity change, fever and unexpected weight change.  Respiratory:  Positive for cough, chest tightness and shortness of breath.   Cardiovascular:  Negative for chest pain, palpitations and leg swelling.  Gastrointestinal:  Negative for abdominal distention, abdominal pain, constipation and diarrhea.  Genitourinary:  Negative for difficulty urinating.  Musculoskeletal:  Negative for arthralgias.  Neurological:  Negative for dizziness, syncope and light-headedness.  Psychiatric/Behavioral:  Negative for agitation and confusion.    Blood pressure (!) 148/77, pulse 74, temperature 98.7 F (37.1 C), temperature source Oral, resp. rate (!) 22, SpO2 95 %. Physical  Exam Constitutional:      Appearance: He is well-developed. He is obese.  HENT:     Head: Normocephalic and atraumatic.  Eyes:     Extraocular Movements: Extraocular movements intact.  Cardiovascular:     Rate and Rhythm: Normal rate. Rhythm irregular.  Pulmonary:     Effort: Pulmonary effort is normal. No tachypnea or respiratory distress.     Breath sounds: Examination of the right-upper field reveals rales. Examination of the left-upper field reveals rales. Examination of the right-middle field reveals rales. Examination of the left-middle field reveals rales. Examination of the right-lower field reveals decreased breath sounds and rales. Examination of the left-lower field reveals decreased breath sounds and rales. Decreased breath sounds and rales present.  Chest:     Chest wall: No tenderness.  Abdominal:     General: Bowel sounds are normal.     Palpations: Abdomen is soft.     Tenderness: There is no abdominal tenderness.  Musculoskeletal:     Cervical back: Normal range of motion.     Right lower leg: No tenderness. Edema (trace) present.     Left  lower leg: No tenderness. Edema (trace) present.  Skin:    General: Skin is warm and dry.  Neurological:     General: No focal deficit present.     Mental Status: He is alert and oriented to person, place, and time.  Psychiatric:        Mood and Affect: Mood normal.        Behavior: Behavior normal.       Emeterio Reeve, DO 06/17/2021, 6:34 PM

## 2021-06-17 NOTE — Progress Notes (Signed)
ReDS vest is not functioning. Unable to obtain ReDS score.  ?

## 2021-06-17 NOTE — Assessment & Plan Note (Addendum)
Changed over to p.o. prednisone and discharged on taper.  Continue nebulizers.  Improved.  Currently on room air.  ?

## 2021-06-17 NOTE — Assessment & Plan Note (Deleted)
New diagnosis w/ multiple risk factors ?Diuresis ongoing, I&O ?Echo pending ?Restarted BB on lower dose ?Holding off on adding ARB at least for now given AKI ?

## 2021-06-17 NOTE — Consult Note (Signed)
CODE SEPSIS - PHARMACY COMMUNICATION ? ?**Broad Spectrum Antibiotics should be administered within 1 hour of Sepsis diagnosis** ? ?Time Code Sepsis Called/Page Received: 1632 ? ?Antibiotics Ordered: 3845 ? ?Time of 1st antibiotic administration: 1752 ? ?If necessary, Name of Provider/Nurse Contacted: Spoke with Candace Nuckles. Dose wasn't given within hour because blood cultures were being drawn.  ? ? ?Darrick Penna, PharmD, MS PGPM ?Clinical Pharmacist ?06/17/2021 ?5:36 PM ? ? ?

## 2021-06-17 NOTE — ED Triage Notes (Signed)
Pt from home via EMS. No hx of O2 at home.  Pt increasingly shob with cough x 2 days.  Sats on arrival were 89-90. No COPD hx.  States he does take a "fluid pill" for swelling in his legs.  Does not think he has been diagnosed with CHF.  Capnography 38, 95% on 4L. Afib 68-116. CBG 147, 98.7 temp. Pt endorses chills. Cough productive of clear sputum.  ?

## 2021-06-17 NOTE — Progress Notes (Signed)
[facility] following for sepsis protocol. 

## 2021-06-17 NOTE — Assessment & Plan Note (Deleted)
CPAP ordered

## 2021-06-17 NOTE — Assessment & Plan Note (Deleted)
Known COPD, former smoker  ?O2 support prn to titrate down as able  ?

## 2021-06-17 NOTE — Assessment & Plan Note (Deleted)
monitor BMP w/ diuresis  ?Holding off on adding ARB at least for now ?

## 2021-06-17 NOTE — ED Notes (Addendum)
Code Sepsis canceled per Sharion Settler NP. Misty White RN with [facility] notified.  ?

## 2021-06-17 NOTE — Assessment & Plan Note (Addendum)
Blood pressure starting to trend upward.  Resuming other home medications. ?

## 2021-06-17 NOTE — ED Provider Notes (Signed)
? ?[facility] ?Provider Note ? ? ? Event Date/Time  ? First MD Initiated Contact with Patient 06/17/21 1544   ?  (approximate) ? ?History  ? ?Chief Complaint: Tachycardia and Shortness of Breath ? ?HPI ? ?Dennis Savage is a 88 y.o. male with a past medical history of atrial fibrillation on Xarelto, hypertension, who presents to the emergency department for shortness of breath.  According to the patient he was recently started on a fluid pill for some fluid accumulation in the legs which he states has gone down.  Patient states her last several days he has had worsening shortness of breath, worse today.  Patient states slight chest tightness but denies any "chest pain."  Patient states shortness of breath with cough and occasional clear sputum production.  Patient states he felt like he had a fever earlier today but EMS checked as well as in the hospital here and he has not had a fever as of yet. ? ?Physical Exam  ? ?Triage Vital Signs: ?ED Triage Vitals  ?Enc Vitals Group  ?   BP 06/17/21 1543 (!) 154/92  ?   Pulse Rate 06/17/21 1543 83  ?   Resp 06/17/21 1543 (!) 25  ?   Temp 06/17/21 1543 98.7 ?F (37.1 ?C)  ?   Temp Source 06/17/21 1543 Oral  ?   SpO2 06/17/21 1543 93 %  ?   Weight --   ?   Height --   ?   Head Circumference --   ?   Peak Flow --   ?   Pain Score 06/17/21 1548 4  ?   Pain Loc --   ?   Pain Edu? --   ?   Excl. in [place]? --   ? ? ?Most recent vital signs: ?Vitals:  ? 06/17/21 1543  ?BP: (!) 154/92  ?Pulse: 83  ?Resp: (!) 25  ?Temp: 98.7 ?F (37.1 ?C)  ?SpO2: 93%  ? ? ?General: Awake, no distress.  ?CV:  Good peripheral perfusion.  Regular rate and rhythm  ?Resp:  Mild tachypnea speaking in 2-3 word sentences, rhonchi auscultated bilaterally. ?Abd:  No distention.  Soft, nontender.  No rebound or guarding. ?Other:  No significant lower extremity edema ? ? ?ED Results / Procedures / Treatments  ? ?EKG ? ?EKG viewed and interpreted by myself shows atrial fibrillation at 76 bpm with  a widened QRS, normal axis, normal intervals, nonspecific ST changes. ? ?RADIOLOGY ? ?I personally reviewed the patient's chest x-ray images appears to be most consistent with pulmonary edema my evaluation. ?Radiology is read the chest x-ray as possible pneumonia versus atelectasis in addition to pulmonary edema. ? ? ?MEDICATIONS ORDERED IN ED: ?Medications - No data to display ? ? ?IMPRESSION / MDM / ASSESSMENT AND PLAN / ED COURSE  ?I reviewed the triage vital signs and the nursing notes. ? ?Patient presents to the emergency department for shortness of breath.  Patient satting 89% on room air per EMS placed on 4 L nasal cannula.  We will attempt to titrate down.  Patient has bilateral rhonchi on exam sounds wet.  No significant lower extremity edema.  We will check labs including cardiac enzymes, BNP obtain a chest x-ray and EKG.  Differential is quite broad at this time but would include infectious etiology such as pneumonia, COVID/influenza, CHF/pulmonary edema, ACS.  Given the patient's initial hypoxia 89% with continued tachypnea and rhonchi on exam anticipate likely admission to the hospital once his emergency department  work-up is been completed. ? ?Patient's chest x-ray appears most consistent with pulmonary edema.  However they are also calling a retrocardiac opacity possibly pneumonia.  We will cover with antibiotics.  Patient's labs have resulted showing a mildly elevated troponin, repeat troponin has increased by approximately 20 points.  Patient's COVID and flu are negative.  Urinalysis reassuring.  Lactate of 1.2.  White blood cell count is elevated at 13.6.  Chemistry is overall reassuring.  BNP elevated 453.  Given the patient's hypoxia chest x-ray findings we will cover with antibiotics for pneumonia but we will also dose IV Lasix for more likely CHF exacerbation.  Patient and family agreeable to this plan of care. ? ?FINAL CLINICAL IMPRESSION(S) / ED DIAGNOSES  ? ?Dyspnea ?Hypoxia ?CHF  exacerbation ?Community-acquired pneumonia ? ? ? ?Note:  This document was prepared using Dragon voice recognition software and may include unintentional dictation errors. ?  Harvest Dark, MD ?06/17/21 2311 ? ?

## 2021-06-17 NOTE — Assessment & Plan Note (Addendum)
Continue Xarelto, dose decreased from 20 mg to 15 mg for renal adjustment.  With sinus pause and bradycardia with beta-blocker started on admission.  Holding beta-blocker at this time ?

## 2021-06-17 NOTE — Progress Notes (Signed)
Arrived to unit at 2225 to room 243. Oriented to unit and staff. Call light within reach.  ? ?

## 2021-06-18 ENCOUNTER — Inpatient Hospital Stay
Admit: 2021-06-18 | Discharge: 2021-06-18 | Disposition: A | Discharge status: Home / Self Care | Payer: Medicare Other | Attending: Osteopathic Medicine | Admitting: Osteopathic Medicine

## 2021-06-18 ENCOUNTER — Inpatient Hospital Stay: Payer: Medicare Other

## 2021-06-18 ENCOUNTER — Encounter: Payer: Self-pay | Admitting: Osteopathic Medicine

## 2021-06-18 DIAGNOSIS — J189 Pneumonia, unspecified organism: Principal | ICD-10-CM

## 2021-06-18 DIAGNOSIS — E278 Other specified disorders of adrenal gland: Secondary | ICD-10-CM

## 2021-06-18 DIAGNOSIS — N189 Chronic kidney disease, unspecified: Secondary | ICD-10-CM

## 2021-06-18 DIAGNOSIS — R778 Other specified abnormalities of plasma proteins: Secondary | ICD-10-CM

## 2021-06-18 DIAGNOSIS — J441 Chronic obstructive pulmonary disease with (acute) exacerbation: Secondary | ICD-10-CM | POA: Diagnosis not present

## 2021-06-18 DIAGNOSIS — I5033 Acute on chronic diastolic (congestive) heart failure: Secondary | ICD-10-CM | POA: Diagnosis not present

## 2021-06-18 DIAGNOSIS — N179 Acute kidney failure, unspecified: Secondary | ICD-10-CM | POA: Diagnosis not present

## 2021-06-18 LAB — ECHOCARDIOGRAM COMPLETE
AR max vel: 2.65 cm2
AV Peak grad: 6.8 mmHg
Ao pk vel: 1.3 m/s
Area-P 1/2: 3.91 cm2
Height: 69.5 in
S' Lateral: 3.95 cm
Weight: 3410.96 oz

## 2021-06-18 LAB — BASIC METABOLIC PANEL
Anion gap: 12 (ref 5–15)
BUN: 31 mg/dL — ABNORMAL HIGH (ref 8–23)
CO2: 27 mmol/L (ref 22–32)
Calcium: 8.6 mg/dL — ABNORMAL LOW (ref 8.9–10.3)
Chloride: 99 mmol/L (ref 98–111)
Creatinine, Ser: 1.72 mg/dL — ABNORMAL HIGH (ref 0.61–1.24)
GFR, Estimated: 38 mL/min — ABNORMAL LOW (ref >60)
Glucose, Bld: 192 mg/dL — ABNORMAL HIGH (ref 70–99)
Potassium: 3.8 mmol/L (ref 3.5–5.1)
Sodium: 138 mmol/L (ref 135–145)

## 2021-06-18 LAB — TROPONIN I (HIGH SENSITIVITY): Troponin I (High Sensitivity): 98 ng/L — ABNORMAL HIGH (ref <18)

## 2021-06-18 MED ORDER — RIVAROXABAN 15 MG PO TABS
15.0000 mg | ORAL_TABLET | Freq: Every day | ORAL | Status: DC
  Administered 2021-06-18 – 2021-06-22 (×5): 15 mg via ORAL
  Filled 2021-06-18 (×6): qty 1

## 2021-06-18 MED ORDER — LEVOFLOXACIN 500 MG PO TABS
500.0000 mg | ORAL_TABLET | Freq: Every evening | ORAL | Status: DC
  Administered 2021-06-18: 500 mg via ORAL
  Filled 2021-06-18: qty 1

## 2021-06-18 MED ORDER — METHYLPREDNISOLONE SODIUM SUCC 40 MG IJ SOLR
40.0000 mg | Freq: Every day | INTRAMUSCULAR | Status: DC
  Administered 2021-06-18 – 2021-06-21 (×4): 40 mg via INTRAVENOUS
  Filled 2021-06-18 (×4): qty 1

## 2021-06-18 MED ORDER — LEVOFLOXACIN IN D5W 750 MG/150ML IV SOLN: 750.0000 mg | INTRAVENOUS | Status: DC

## 2021-06-18 NOTE — Progress Notes (Signed)
*  PRELIMINARY RESULTS* ?Echocardiogram ?2D Echocardiogram has been performed. ? ?Dennis Savage ?06/18/2021, 12:44 PM ?

## 2021-06-18 NOTE — Assessment & Plan Note (Signed)
Borderline elevation likely demand ischemia ?

## 2021-06-18 NOTE — Assessment & Plan Note (Addendum)
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  On admission at 1.45.  Creatinine went up to 1.95 with diuresis, so further diuresis held and received some mild IV fluids.  By the day of discharge, creatinine at 1.5.  He will follow-up with his PCP as outpatient. ?

## 2021-06-18 NOTE — Assessment & Plan Note (Addendum)
Patient was initially diuresed aggressively and creatinine increased.  Likely more COPD exacerbation than CHF.  EF 60 to 65%.  To date, has diuresed 9.5 L and is -5.3 L deficient.  Will be discharged on Jard[place] ?

## 2021-06-18 NOTE — Progress Notes (Signed)
Patient slept through the night. Reports headache subsided with PRN tylenol. Patient daughter called and requested updates, No password on file. Daughter agreed to speak with patient and establish a password in the morning. Safety maintained, bed kept in lowest position, call bell kept within reach, personal possessions kept close. Weight decreased from 98.kg to 96.7kg. Patient has asymptomatic HR dipped to 36 for a second while sleeping and 2.4sec pause. Randol Kern NP made aware.   ?

## 2021-06-18 NOTE — Progress Notes (Signed)
?Progress Note ? ? ?Patient: Dennis Savage LGX:[id number] DOB: 06-11-1929 DOA: 06/17/2021     1 ?DOS: the patient was seen and examined on 06/18/2021 ?  ? ? ?Assessment and Plan: ?* COPD with exacerbation ([place]) ?Will restart Solu-Medrol 40 mg daily.  Continue nebulizer treatments.  Patient was on 3 L of oxygen this morning and looks like able to come off oxygen this afternoon. ? ?CAP (community acquired pneumonia) ?With small left pleural effusion.  We will repeat x-ray with lateral decubitus films in the morning and reassess.  On Levaquin. ? ?Acute on chronic diastolic CHF (congestive heart failure) ([place]) ?Patient was aggressively diuresed and seems euvolemic.  We will hold further Lasix doses and recheck creatinine again tomorrow.  Beta-blocker held with sinus pause and bradycardia overnight. ? ?Acute kidney injury superimposed on CKD ([place]) ?Patient's baseline creatinine around 1.07.  Creatinine upon admission 1.45 up to 1.72 with diuresis.  Hold diuresis today and monitor and recheck creatinine tomorrow morning. ? ?Atrial fibrillation ([place]) ?Continue Xarelto.  With sinus pause and bradycardia overnight hold beta-blocker. ? ?Adrenal mass, left ([place]) ?Seen on prior imaging.  PET scan in 2020 only showed mild hypermetabolism.  In 2021 on CT scan of the chest showed a left adrenal mass have gotten as large as 4.1 cm.  Outpatient follow-up. ? ?Benign essential HTN ?Current blood pressure on the lower side hold off on medications currently ? ?Apnea, sleep ?CPAP ordered ? ?Elevated troponin ?Borderline elevation likely demand ischemia ? ? ? ? ? ? ? ?Subjective: Patient came in with shortness of breath.  Patient was given Lasix and Solu-Medrol for CHF and COPD exacerbation and started on antibiotics.  Feeling a little bit better today.  He does not wear oxygen at home and this morning he was on 3 L of oxygen.  Patient had some nausea and some diarrhea and mild cough. ? ?Physical Exam: ?Vitals:  ? 06/18/21 0425 06/18/21 0941  06/18/21 0950 06/18/21 1152  ?BP: 127/69 121/73  (!) 105/57  ?Pulse: (!) 53 (!) 59  (!) 50  ?Resp: '19 18  20  '$ ?Temp:  98.1 ?F (36.7 ?C)  97.7 ?F (36.5 ?C)  ?TempSrc:      ?SpO2: 96% 96% 96% 90%  ?Weight:      ?Height:      ? ?Physical Exam ?HENT:  ?   Head: Normocephalic.  ?   Mouth/Throat:  ?   Pharynx: No oropharyngeal exudate.  ?Eyes:  ?   General: Lids are normal.  ?   Conjunctiva/sclera: Conjunctivae normal.  ?Cardiovascular:  ?   Rate and Rhythm: Normal rate and regular rhythm.  ?   Heart sounds: Normal heart sounds, S1 normal and S2 normal.  ?Pulmonary:  ?   Breath sounds: Examination of the right-lower field reveals decreased breath sounds and rhonchi. Examination of the left-lower field reveals decreased breath sounds and rhonchi. Decreased breath sounds and rhonchi present. No wheezing or rales.  ?Abdominal:  ?   Palpations: Abdomen is soft.  ?   Tenderness: There is no abdominal tenderness.  ?Musculoskeletal:  ?   Right lower leg: No swelling.  ?   Left lower leg: No swelling.  ?Skin: ?   General: Skin is warm.  ?   Findings: No rash.  ?Neurological:  ?   Mental Status: He is alert and oriented to person, place, and time.  ?  ? ?Data Reviewed: ?Laboratory and radiological data during hospital stay reviewed.  Creatinine up to 1.72 today BNP 453.  Troponins borderline. ? ?Family Communication: Updated patient's daughter on the phone ? ?Disposition: ?Status is: Inpatient ?Remains inpatient appropriate because: Being treated for COPD exacerbation pneumonia and CHF exacerbation ? ?Planned Discharge Destination: Home ? ?Author: ?Loletha Grayer, MD ?06/18/2021 2:15 PM ? ?For on call review [email or web address].  ? ?

## 2021-06-18 NOTE — Progress Notes (Signed)
Pt refused cpap tonight. Pt states he does not wear one at home. ?

## 2021-06-18 NOTE — Assessment & Plan Note (Signed)
Seen on prior imaging.  PET scan in 2020 only showed mild hypermetabolism.  In 2021 on CT scan of the chest showed a left adrenal mass have gotten as large as 4.1 cm.  Outpatient follow-up. ?

## 2021-06-18 NOTE — Assessment & Plan Note (Addendum)
Completed course of Levaquin.  So far culture of thoracentesis negative. ?

## 2021-06-18 NOTE — Consult Note (Signed)
[facility] Cardiology  CARDIOLOGY CONSULT NOTE  Patient ID: Dennis Savage MRN: [id number] DOB/AGE: 10-06-1929 88 y.o.  Admit date: 06/17/2021 Referring Physician Page Primary Physician [facility] Cardiologist  Reason for Consultation congestive heart failure  HPI: 88 year old gentleman referred for evaluation of congestive heart failure.  Patient has a history of chronic atrial fibrillation on Xarelto for stroke prevention.  He also has a history of COPD, followed by Dr. Raul Del.  He presents with 4-day history of worsening shortness of breath and peripheral edema.  ECG revealed atrial fibrillation at a rate of 76 bpm with underlying right bundle branch block.  Chest x-ray revealed left pleural effusion.  Admission labs notable for mildly elevated BNP of 453.6.  High-sensitivity troponin was 74 and 98.  The patient denies chest pain.  Patient was treated with IV Solu-Medrol, DuoNebs, and IV furosemide with initial clinical improvement.  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   Dizziness 02/25/2015   GERD (gastroesophageal reflux disease)    Heart murmur    Hypertension    Interstitial lung disease ([place]) 10/13/2013   LBP (low back pain) 11/03/2010   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   Peripheral vascular disease ([place]) 11/03/2010   Retinal hemorrhage 03/16/2014    Past Surgical History:  Procedure Laterality Date   APPENDECTOMY     BACK SURGERY     CARPAL TUNNEL RELEASE Bilateral    EYE SURGERY Bilateral     Cataract Extraction with IOL   HERNIA REPAIR     Umbilical Hernia X 3   JOINT REPLACEMENT     bilat knees   NASAL SINUS SURGERY     REPLACEMENT TOTAL KNEE Bilateral    SHOULDER SURGERY Right    TONSILLECTOMY     TOTAL HIP ARTHROPLASTY Right 10/12/2015   Procedure: TOTAL HIP ARTHROPLASTY;  Surgeon: Dereck Leep, MD;  Location: [facility] ORS;  Service: Orthopedics;  Laterality: Right;    Medications Prior to Admission  Medication Sig Dispense Refill Last Dose   amLODipine (NORVASC) 10 MG tablet Take 10 mg by mouth daily.   06/17/2021 at AM   chlorthalidone (HYGROTON) 25 MG tablet Take 25 mg by mouth daily as needed.    06/17/2021   docusate sodium (COLACE) 250 MG capsule Take 250 mg by mouth daily as needed for constipation.   06/17/2021 at AM   Multiple Vitamins-Minerals (PRESERVISION AREDS 2 PO) Take by mouth.   06/17/2021 at AM   rivaroxaban (XARELTO) 20 MG TABS tablet Take 20 mg by mouth daily with supper.   06/16/2021 at NIGHT   acetaminophen (TYLENOL) 325 MG tablet Take 650 mg by mouth every 6 (six) hours as needed.   PRN at PRN   metoprolol tartrate (LOPRESSOR) 25 MG tablet Take 25 mg by mouth. (Patient not taking: Reported on 06/17/2021)   Not Taking   ondansetron (ZOFRAN ODT) 4 MG disintegrating tablet Take 1 tablet (4 mg total) by mouth every 6 (six) hours as needed for nausea or vomiting. (Patient not taking: Reported  on 06/17/2021) 20 tablet 0 Not Taking   predniSONE (DELTASONE) 10 MG tablet  (Patient not taking: Reported on 06/17/2021)   Completed Course   traMADol (ULTRAM) 50 MG tablet Take 1 tablet (50 mg total) by mouth every 6 (six) hours as needed for moderate pain. (Patient not taking: Reported on 06/17/2021) 20 tablet 0 Not Taking   Social History   Socioeconomic History   Marital status: Single    Spouse name: Not on file   Number of children: Not on file   Years of education: Not on file   Highest education level: Not on file  Occupational History   Not on file  Tobacco Use    Smoking status: Former    Packs/day: 1.00    Types: Cigarettes   Smokeless tobacco: Never  Substance and Sexual Activity   Alcohol use: No   Drug use: No   Sexual activity: Not on file  Other Topics Concern   Not on file  Social History Narrative   Not on file   Social Determinants of Health   Financial Resource Strain: Not on file  Food Insecurity: Not on file  Transportation Needs: Not on file  Physical Activity: Not on file  Stress: Not on file  Social Connections: Not on file  Intimate Partner Violence: Not on file    Family History  Problem Relation Age of Onset   Bladder Cancer Neg Hx    Kidney cancer Neg Hx    Prostate cancer Neg Hx       Review of systems complete and found to be negative unless listed above      PHYSICAL EXAM  General: Well developed, well nourished, in no acute distress HEENT:  Normocephalic and atramatic Neck:  No JVD.  Lungs: Clear bilaterally to auscultation and percussion. Heart: HRRR . Normal S1 and S2 without gallops or murmurs.  Abdomen: Bowel sounds are positive, abdomen soft and non-tender  Msk:  Back normal, normal gait. Normal strength and tone for age. Extremities: No clubbing, cyanosis or edema.   Neuro: Alert and oriented X 3. Psych:  Good affect, responds appropriately  Labs:   Lab Results  Component Value Date   WBC 13.6 (H) 06/17/2021   HGB 15.0 06/17/2021   HCT 46.6 06/17/2021   MCV 90.1 06/17/2021   PLT 194 06/17/2021    Recent Labs  Lab 06/17/21 1557 06/18/21 0531  NA 136 138  K 3.7 3.8  CL 101 99  CO2 26 27  BUN 28* 31*  CREATININE 1.45* 1.72*  CALCIUM 9.0 8.6*  PROT 7.2  --   BILITOT 1.8*  --   ALKPHOS 63  --   ALT 19  --   AST 29  --   GLUCOSE 123* 192*   Lab Results  Component Value Date   TROPONINI <0.03 02/17/2015   No results found for: CHOL No results found for: HDL No results found for: LDLCALC No results found for: TRIG No results found for: CHOLHDL No results found for:  LDLDIRECT    Radiology: [facility] Chest Port 1 View  Result Date: 06/18/2021 CLINICAL DATA:  88 year old male with history of congestive heart failure. EXAM: PORTABLE CHEST 1 VIEW COMPARISON:  Chest x-ray 06/17/2021. FINDINGS: Lung volumes are low. Opacity throughout the left mid to lower lung obscuring the left hemidiaphragm in the left costophrenic sulcus indicative of atelectasis and/or consolidation with superimposed small to moderate left pleural effusion. Ill-defined opacity in the medial aspect of the right lung base also  noted. No right pleural effusion. No pneumothorax. No evidence of pulmonary edema. Heart size is mildly enlarged. Upper mediastinal contours are within normal limits. Atherosclerotic calcifications in the thoracic aorta. IMPRESSION: 1. Atelectasis and/or consolidation in the left mid to lower lung and medial aspect of right lung base with small to moderate left pleural effusion. 2. Aortic atherosclerosis. 3. Mild cardiomegaly. Electronically Signed   By: Vinnie Langton M.D.   On: 06/18/2021 05:02   DG Chest Portable 1 View  Result Date: 06/17/2021 CLINICAL DATA:  SOB EXAM: PORTABLE CHEST 1 VIEW COMPARISON:  May 2021 FINDINGS: The cardiomediastinal silhouette is unchanged and enlarged in contour.Perihilar vascular congestion. Likely small LEFT pleural effusion. Heterogeneous LEFT retrocardiac opacity. Mild interstitial prominence. Visualized abdomen is unremarkable. IMPRESSION: 1. Heterogeneous LEFT retrocardiac opacity. Differential considerations include infection, aspiration or atelectasis. 2. There is a background of likely pulmonary edema with a small LEFT pleural effusion Followup PA and lateral chest X-ray is recommended in 3-4 weeks following trial of antibiotic therapy to ensure resolution and exclude underlying malignancy. Electronically Signed   By: Valentino Saxon M.D.   On: 06/17/2021 16:16    EKG: Atrial fibrillation with right bundle branch block at a rate of 76  bpm  ASSESSMENT AND PLAN:   1.  Congestive heart failure, with mildly elevated BNP, peripheral edema, with clinical improvement after IV furosemide 2.  COPD exacerbation, on DuoNebs and IV Solu-Medrol 3.  Chronic atrial fibrillation, on Xarelto for stroke prevention 4.  Mildly elevated high-sensitivity troponin (74, 98), in the absence of chest pain, in the setting of CHF and COPD exacerbation, likely demand supply ischemia  Recommendations  1.  Agree with current therapy 2.  Continue diuresis 3.  Carefully monitor renal status 4.  Defer full dose anticoagulation 5.  Review 2D echocardiogram 6.  Further recommendations pending 2D echocardiogram results  Signed: Isaias Cowman MD,PhD, [facility] 06/18/2021, 9:45 AM

## 2021-06-19 ENCOUNTER — Inpatient Hospital Stay: Payer: Medicare Other

## 2021-06-19 DIAGNOSIS — J90 Pleural effusion, not elsewhere classified: Secondary | ICD-10-CM

## 2021-06-19 DIAGNOSIS — J441 Chronic obstructive pulmonary disease with (acute) exacerbation: Secondary | ICD-10-CM | POA: Diagnosis not present

## 2021-06-19 DIAGNOSIS — J189 Pneumonia, unspecified organism: Secondary | ICD-10-CM | POA: Diagnosis not present

## 2021-06-19 DIAGNOSIS — N179 Acute kidney failure, unspecified: Secondary | ICD-10-CM | POA: Diagnosis not present

## 2021-06-19 LAB — BASIC METABOLIC PANEL
Anion gap: 10 (ref 5–15)
BUN: 47 mg/dL — ABNORMAL HIGH (ref 8–23)
CO2: 29 mmol/L (ref 22–32)
Calcium: 8.6 mg/dL — ABNORMAL LOW (ref 8.9–10.3)
Chloride: 100 mmol/L (ref 98–111)
Creatinine, Ser: 1.95 mg/dL — ABNORMAL HIGH (ref 0.61–1.24)
GFR, Estimated: 32 mL/min — ABNORMAL LOW (ref >60)
Glucose, Bld: 138 mg/dL — ABNORMAL HIGH (ref 70–99)
Potassium: 3.5 mmol/L (ref 3.5–5.1)
Sodium: 139 mmol/L (ref 135–145)

## 2021-06-19 LAB — LACTATE DEHYDROGENASE, PLEURAL OR PERITONEAL FLUID: LD, Fluid: 93 U/L — ABNORMAL HIGH (ref 3–23)

## 2021-06-19 LAB — BODY FLUID CELL COUNT WITH DIFFERENTIAL
Eos, Fluid: 1 %
Lymphs, Fluid: 49 %
Monocyte-Macrophage-Serous Fluid: 5 %
Neutrophil Count, Fluid: 44 %
Other Cells, Fluid: 1 %
Total Nucleated Cell Count, Fluid: 565 cu mm

## 2021-06-19 LAB — URINE CULTURE

## 2021-06-19 LAB — GLUCOSE, PLEURAL OR PERITONEAL FLUID: Glucose, Fluid: 173 mg/dL

## 2021-06-19 LAB — PROTEIN, PLEURAL OR PERITONEAL FLUID: Total protein, fluid: <3 g/dL

## 2021-06-19 MED ORDER — LEVOFLOXACIN 500 MG PO TABS
750.0000 mg | ORAL_TABLET | ORAL | Status: AC
  Administered 2021-06-20: 750 mg via ORAL
  Filled 2021-06-19: qty 2

## 2021-06-19 MED ORDER — SODIUM CHLORIDE 0.9 % IV BOLUS
250.0000 mL | Freq: Once | INTRAVENOUS | Status: AC
  Administered 2021-06-19: 250 mL via INTRAVENOUS

## 2021-06-19 MED ORDER — IPRATROPIUM-ALBUTEROL 0.5-2.5 (3) MG/3ML IN SOLN
3.0000 mL | Freq: Three times a day (TID) | RESPIRATORY_TRACT | Status: DC
  Administered 2021-06-19 – 2021-06-20 (×3): 3 mL via RESPIRATORY_TRACT
  Filled 2021-06-19 (×3): qty 3

## 2021-06-19 NOTE — Progress Notes (Addendum)
?Progress Note ? ? ?Patient: Dennis Savage:[id number] DOB: 10-Apr-1930 DOA: 06/17/2021     2 ?DOS: the patient was seen and examined on 06/19/2021 ?  ? ? ?Assessment and Plan: ?* COPD with exacerbation ([place]) ?Continue Solu-Medrol 40 mg daily.  Continue nebulizer treatments. ? ?CAP (community acquired pneumonia) ?Levaquin changed over to p.o. ? ?Acute on chronic diastolic CHF (congestive heart failure) ([place]) ?Patient was initially diuresed and creatinine increased.  Likely more COPD than CHF.  EF 60 to 65%. ? ?Pleural effusion on left ?Layering pleural effusion and moderate in nature.  Thoracentesis drew off 300 mL of blood-tinged fluid.  Follow-up laboratory data. ? ?Acute kidney injury superimposed on CKD ([place]) ?Patient's baseline creatinine around 1.07 (CKD stage II).  Creatinine went up to 1.95 with diuresis.  Hold further diuresis.  Give a fluid bolus today and recheck kidney function tomorrow. ? ?Atrial fibrillation ([place]) ?Continue Xarelto.  With sinus pause and bradycardia overnight hold beta-blocker. ? ?Adrenal mass, left ([place]) ?Seen on prior imaging.  PET scan in 2020 only showed mild hypermetabolism.  In 2021 on CT scan of the chest showed a left adrenal mass have gotten as large as 4.1 cm.  Outpatient follow-up. ? ?Benign essential HTN ?Blood pressure stable ? ?Apnea, sleep ?CPAP ordered ? ?Elevated troponin ?Borderline elevation likely demand ischemia ? ? ? ? ?  ? ?Subjective: Patient feeling a little bit better.  With layering left pleural effusion on x-ray today the patient was amenable to ultrasound-guided thoracentesis by interventional radiology.  Benefits and risks of the procedure explained to patient.  Patient still had some shortness of breath and was placed back on oxygen early morning.  Overall feeling better than when he came in ? ?Physical Exam: ?Vitals:  ? 06/19/21 1407 06/19/21 1436 06/19/21 1500 06/19/21 1632  ?BP: 138/64 (!) 122/56    ?Pulse: 67 60    ?Resp:      ?Temp:      ?TempSrc:       ?SpO2: 94% 96% 94% 94%  ?Weight:      ?Height:      ? ?Physical Exam ?HENT:  ?   Head: Normocephalic.  ?   Mouth/Throat:  ?   Pharynx: No oropharyngeal exudate.  ?Eyes:  ?   General: Lids are normal.  ?   Conjunctiva/sclera: Conjunctivae normal.  ?Cardiovascular:  ?   Rate and Rhythm: Normal rate and regular rhythm.  ?   Heart sounds: Normal heart sounds, S1 normal and S2 normal.  ?Pulmonary:  ?   Breath sounds: Examination of the right-lower field reveals decreased breath sounds. Examination of the left-lower field reveals decreased breath sounds and rhonchi. Decreased breath sounds and rhonchi present. No wheezing or rales.  ?Abdominal:  ?   Palpations: Abdomen is soft.  ?   Tenderness: There is no abdominal tenderness.  ?Musculoskeletal:  ?   Right lower leg: No swelling.  ?   Left lower leg: No swelling.  ?Skin: ?   General: Skin is warm.  ?   Findings: No rash.  ?Neurological:  ?   Mental Status: He is alert and oriented to person, place, and time.  ?  ?Data Reviewed: ? ?Today's creatinine up to 1.95 ? ?Family Communication: Updated patient's daughter on the phone ? ?Disposition: ?Status is: Inpatient ?Remains inpatient appropriate because: Patient had thoracentesis today and treated for pneumonia and COPD exacerbation with IV Solu-Medrol ? Planned Discharge Destination: Home ? ?Author: ?Loletha Grayer, MD ?06/19/2021 5:11 PM ? ?For on call  review [email or web address].  ?

## 2021-06-19 NOTE — Consult Note (Signed)
? ?  Heart Failure Nurse Navigator Note ? ?HFpEF 60-65%.  Moderate LVH.  Mild to moderate aortic insufficiency mild to moderate cuspid regurgitation.  Mild mitral regurgitation. ? ?He presented to the emergency room with complaints of worsening shortness of breath and peripheral edema. ? ?Comorbidities: ? ?COPD ?Chronic kidney disease ?Atrial fibrillation ?Hypertension ?Sleep apnea ? ?Medications: ? ?Amlodipine 10 mg daily ?Xarelto 15 mg daily with supper ? ?Beta-blocker is held due to sinus pause.  Lasix is also on hold. ? ? ?Labs: ? ?Sodium 139, potassium 3.5, chloride 100, CO2 29, BUN 47, creatinine 1.95 up from 1.72 and was 1.45 on admission.  GFR is 32 ?Weight is 95.6 kg ?Blood pressure 138/70 ?Intake 480 mL ?Output 1300 mL ? ?Initial meeting with patient, who was lying quietly in bed in no acute distress. ? ?He states that he has never been admitted for heart failure but has been on a water pill for approximately 3 years. ? ? ?He states he lives here in [place] with his 2 daughters and grandkids.  His youngest daughter is the one that prepares the meals. ? ?He continues to use a little salt at the table, recommended that he remove the saltshaker from the table completely.  He states that his daughter has taken bacon and ham out of his diet.  He states that he does like to eat some potato chips.  Recommend that he find the salt free potato chips. ?Discussed the importance of fluid restriction.  Also explained the relationship between salt/sodium and fluids. ? ?Also went over daily weights and what to report. ? ?Discussed follow-up in the outpatient heart failure clinic for which he has an appointment on March 17 at 1:30 in the afternoon.  He has a 0% rating of no-shows which is 0 out of 23 appointments. 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 ? ?He was given the living with heart failure teaching booklet, zone magnet and information on heart failure and low sodium.  He had no further  questions at this time.  We will continue to follow along. ? ?Pricilla Riffle RN CHFN ?

## 2021-06-19 NOTE — Progress Notes (Signed)
I went to patient's chart because he was assigned to me at the beginning of the shift. ?

## 2021-06-19 NOTE — Progress Notes (Signed)
PHARMACY NOTE:  ANTIMICROBIAL RENAL DOSAGE ADJUSTMENT ? ?Current antimicrobial regimen includes a mismatch between antimicrobial dosage and estimated renal function.  As per policy approved by the Pharmacy & Therapeutics and Medical Executive Committees, the antimicrobial dosage will be adjusted accordingly. ? ?Current antimicrobial dosage:  levofloxacin 500 mg every 24 hours  ? ?Indication: Community Acquired Pneumonia ? ?Renal Function: ? ?Estimated Creatinine Clearance: 29.6 mL/min (A) (by C-G formula based on SCr of 1.95 mg/dL (H)). ? ?   ?Antimicrobial dosage has been changed to:  levofloxacin 750 mg every 48 hours ? ? ?Thank you for allowing pharmacy to be a part of this patient's care. ? ?Wynelle Cleveland, PharmD ?Pharmacy Resident  ?06/19/2021 ?11:30 AM ? ?

## 2021-06-19 NOTE — Progress Notes (Signed)
Patient OTF upon arrival to patient room. Primary RN aware, will notify IV team when patient return. ?

## 2021-06-19 NOTE — Evaluation (Signed)
Physical Therapy Evaluation ?Patient Details ?Name: Dennis Savage ?MRN: [id number] ?DOB: 12/19/1929 ?Today's Date: 06/19/2021 ? ?History of Present Illness ? Dennis Savage is a 88 y.o. male with a past medical history of atrial fibrillation on Xarelto, hypertension, who presents to the emergency department for shortness of breath. ?  ?Clinical Impression ? Patient received in bed, he is agreeable to PT assessment. Patient reports his breathing is improved. Patient is mod independent with bed mobility. Transfers with min assist. He ambulated 25 feet in room on room air with RW and min guard/supervision. O2 sats down to 92% with activity and increased to 94% at rest on room air. Patient left on room air. He will continue to benefit from skilled PT while here to improve activity tolerance and safety for return home at discharge.    ?   ? ?Recommendations for follow up therapy are one component of a multi-disciplinary discharge planning process, led by the attending physician.  Recommendations may be updated based on patient status, additional functional criteria and insurance authorization. ? ?Follow Up Recommendations No PT follow up ? ?  ?Assistance Recommended at Discharge Intermittent Supervision/Assistance  ?Patient can return home with the following ? A little help with bathing/dressing/bathroom;A little help with walking and/or transfers;Assist for transportation;Help with stairs or ramp for entrance ? ?  ?Equipment Recommendations None recommended by PT  ?Recommendations for Other Services ?    ?  ?Functional Status Assessment Patient has had a recent decline in their functional status and demonstrates the ability to make significant improvements in function in a reasonable and predictable amount of time.  ? ?  ?Precautions / Restrictions Precautions ?Precautions: Fall  ? ?  ? ?Mobility ? Bed Mobility ?Overal bed mobility: Modified Independent ?  ?  ?  ?  ?  ?  ?  ?  ? ?Transfers ?Overall transfer level: Needs  assistance ?Equipment used: Rolling walker (2 wheels) ?Transfers: Sit to/from Stand ?Sit to Stand: Min assist ?  ?  ?  ?  ?  ?  ?  ? ?Ambulation/Gait ?Ambulation/Gait assistance: Min guard, Supervision ?Gait Distance (Feet): 25 Feet ?Assistive device: Rolling walker (2 wheels) ?Gait Pattern/deviations: Step-through pattern ?Gait velocity: decr ?  ?  ?General Gait Details: patient is generally safe with mobility using RW, ambulated on room air and sats down to 92%, with rest, increased to 94%. Patient left on room air. ? ?Stairs ?  ?  ?  ?  ?  ? ?Wheelchair Mobility ?  ? ?Modified Rankin (Stroke Patients Only) ?  ? ?  ? ?Balance Overall balance assessment: Modified Independent ?  ?  ?  ?  ?  ?  ?  ?  ?  ?  ?  ?  ?  ?  ?  ?  ?  ?  ?   ? ? ? ?Pertinent Vitals/Pain Pain Assessment ?Pain Assessment: No/denies pain  ? ? ?Home Living Family/patient expects to be discharged to:: Private residence ?Living Arrangements: Children ?Available Help at Discharge: Family;Available PRN/intermittently ?Type of Home: House ?Home Access: Stairs to enter;Ramped entrance ?  ?  ?  ?Home Layout: One level ?Home Equipment: Kasandra Knudsen - single point;Wheelchair Probation officer (4 wheels) ?   ?  ?Prior Function Prior Level of Function : Independent/Modified Independent ?  ?  ?  ?  ?  ?  ?Mobility Comments: uses cane or nothing in home at baseline ?ADLs Comments: independent ?  ? ? ?Hand Dominance  ?   ? ?  ?  Extremity/Trunk Assessment  ? Upper Extremity Assessment ?Upper Extremity Assessment: Overall WFL for tasks assessed ?  ? ?Lower Extremity Assessment ?Lower Extremity Assessment: Overall WFL for tasks assessed ?  ? ?Cervical / Trunk Assessment ?Cervical / Trunk Assessment: Normal  ?Communication  ? Communication: HOH  ?Cognition Arousal/Alertness: Awake/alert ?Behavior During Therapy: [facility] for tasks assessed/performed ?Overall Cognitive Status: Within Functional Limits for tasks assessed ?  ?  ?  ?  ?  ?  ?  ?  ?  ?  ?  ?  ?  ?  ?  ?  ?  ?  ?   ? ?  ?General Comments   ? ?  ?Exercises    ? ?Assessment/Plan  ?  ?PT Assessment Patient needs continued PT services  ?PT Problem List Decreased strength;Decreased mobility;Decreased activity tolerance;Cardiopulmonary status limiting activity ? ?   ?  ?PT Treatment Interventions Gait training;Functional mobility training;Therapeutic activities;Patient/family education   ? ?PT Goals (Current goals can be found in the Care Plan section)  ?Acute Rehab PT Goals ?Patient Stated Goal: to return home ?PT Goal Formulation: With patient ?Time For Goal Achievement: 06/26/21 ?Potential to Achieve Goals: Good ? ?  ?Frequency Min 2X/week ?  ? ? ?Co-evaluation   ?  ?  ?  ?  ? ? ?  ?AM-PAC PT "6 Clicks" Mobility  ?Outcome Measure Help needed turning from your back to your side while in a flat bed without using bedrails?: A Little ?Help needed moving from lying on your back to sitting on the side of a flat bed without using bedrails?: A Little ?Help needed moving to and from a bed to a chair (including a wheelchair)?: A Little ?Help needed standing up from a chair using your arms (e.g., wheelchair or bedside chair)?: A Little ?Help needed to walk in hospital room?: A Little ?Help needed climbing 3-5 steps with a railing? : A Little ?6 Click Score: 18 ? ?  ?End of Session Equipment Utilized During Treatment: Oxygen ?Activity Tolerance: Patient tolerated treatment well ?Patient left: in chair;with call bell/phone within reach ?Nurse Communication: Mobility status ?PT Visit Diagnosis: Muscle weakness (generalized) (M62.81) ?  ? ?Time: 3343-5686 ?PT Time Calculation (min) (ACUTE ONLY): 18 min ? ? ?Charges:   PT Evaluation ?$PT Eval Moderate Complexity: 1 Mod ?PT Treatments ?$Gait Training: 8-22 mins ?  ?   ? ? ?[employer], PT, [employer] ?06/19/21,10:33 AM ? ? ?

## 2021-06-19 NOTE — Progress Notes (Signed)
SATURATION QUALIFICATIONS: (This note is used to comply with regulatory documentation for home oxygen) ? ?Patient Saturations on Room Air at Rest = 94% ? ?Patient Saturations on Room Air while Ambulating = 91% ? ?Patient Saturations on n/a Liters of oxygen while Ambulating = n/a% ? ?Please briefly explain why patient needs home oxygen: patient does not appear to need home oxygen at this time.  ?

## 2021-06-19 NOTE — Progress Notes (Signed)
Patient had an uneventful night. Awake most of the night and stated, "I was asleep all day so I am paying for it now". Otherwise sats were noted 91% while laying flat. Refused CPAP. Denied pain or discomfort. Call bell kept within reach.  ?

## 2021-06-19 NOTE — Progress Notes (Signed)
[facility] Cardiology  CARDIOLOGY CONSULT NOTE  Patient ID: Dennis Savage MRN: [id number] DOB/AGE: November 16, 1929 88 y.o.  Admit date: 06/17/2021 Referring Physician [place] Primary Physician [facility] Cardiologist Nehemiah Massed [phone])  Reason for Consultation congestive heart failure  HPI: 88 year old gentleman referred for evaluation of congestive heart failure.  Patient has a history of chronic atrial fibrillation on Xarelto for stroke prevention.  He also has a history of COPD, followed by Dr. Raul Del.  He presents with 4-day history of worsening shortness of breath and peripheral edema.  ECG revealed atrial fibrillation at a rate of 76 bpm with underlying right bundle branch block.  Chest x-ray revealed left pleural effusion.  Admission labs notable for mildly elevated BNP of 453.6.  High-sensitivity troponin was 74 and 98.  The patient denies chest pain.  Patient was treated with IV Solu-Medrol, DuoNebs, and IV furosemide with initial clinical improvement.  Interval History: -feels much better this morning, off supplemental O2 -ambulated in the room with PT and did well -denies chest pain, worsening SOB, palpitations.   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   Dizziness 02/25/2015   GERD (gastroesophageal reflux disease)    Heart murmur    Hypertension    Interstitial lung disease ([place]) 10/13/2013   LBP (low back pain) 11/03/2010   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   Peripheral vascular disease ([place]) 11/03/2010    Retinal hemorrhage 03/16/2014    Past Surgical History:  Procedure Laterality Date   APPENDECTOMY     BACK SURGERY     CARPAL TUNNEL RELEASE Bilateral    EYE SURGERY Bilateral    Cataract Extraction with IOL   HERNIA REPAIR     Umbilical Hernia X 3   JOINT REPLACEMENT     bilat knees   NASAL SINUS SURGERY     REPLACEMENT TOTAL KNEE Bilateral    SHOULDER SURGERY Right    TONSILLECTOMY     TOTAL HIP ARTHROPLASTY Right 10/12/2015   Procedure: TOTAL HIP ARTHROPLASTY;  Surgeon: Dereck Leep, MD;  Location: [facility] ORS;  Service: Orthopedics;  Laterality: Right;    Medications Prior to Admission  Medication Sig Dispense Refill Last Dose   amLODipine (NORVASC) 10 MG tablet Take 10 mg by mouth daily.   06/17/2021 at AM   chlorthalidone (HYGROTON) 25 MG tablet Take 25 mg by mouth daily as needed.    06/17/2021   docusate sodium (COLACE) 250 MG capsule Take 250 mg by mouth daily as needed for constipation.   06/17/2021 at AM   Multiple Vitamins-Minerals (PRESERVISION AREDS 2 PO) Take by mouth.   06/17/2021 at AM   rivaroxaban (XARELTO) 20 MG TABS tablet Take 20 mg by mouth daily with supper.   06/16/2021 at NIGHT   acetaminophen (TYLENOL) 325 MG tablet Take 650 mg by mouth every 6 (six) hours as needed.   PRN at PRN   metoprolol tartrate (LOPRESSOR) 25 MG tablet Take 25 mg by mouth. (Patient not taking: Reported on 06/17/2021)   Not Taking  ondansetron (ZOFRAN ODT) 4 MG disintegrating tablet Take 1 tablet (4 mg total) by mouth every 6 (six) hours as needed for nausea or vomiting. (Patient not taking: Reported on 06/17/2021) 20 tablet 0 Not Taking   predniSONE (DELTASONE) 10 MG tablet  (Patient not taking: Reported on 06/17/2021)   Completed Course   traMADol (ULTRAM) 50 MG tablet Take 1 tablet (50 mg total) by mouth every 6 (six) hours as needed for moderate pain. (Patient not taking: Reported on 06/17/2021) 20 tablet 0 Not Taking   Social History   Socioeconomic History   Marital status: Single    Spouse  name: Not on file   Number of children: Not on file   Years of education: Not on file   Highest education level: Not on file  Occupational History   Not on file  Tobacco Use   Smoking status: Former    Packs/day: 1.00    Types: Cigarettes   Smokeless tobacco: Never  Substance and Sexual Activity   Alcohol use: No   Drug use: No   Sexual activity: Not on file  Other Topics Concern   Not on file  Social History Narrative   Not on file   Social Determinants of Health   Financial Resource Strain: Not on file  Food Insecurity: Not on file  Transportation Needs: Not on file  Physical Activity: Not on file  Stress: Not on file  Social Connections: Not on file  Intimate Partner Violence: Not on file    Family History  Problem Relation Age of Onset   Bladder Cancer Neg Hx    Kidney cancer Neg Hx    Prostate cancer Neg Hx       Review of systems complete and found to be negative unless listed above      PHYSICAL EXAM  General: Well developed elderly Caucasian male, well nourished, in no acute distress  HEENT:  Normocephalic and atraumatic Neck:  No JVD.  Lungs: normal respiratory effort on room air.  Decreased breath sounds left lower lung. Heart: Irregularly irregular rhythm with controlled rate. Normal S1 and S2 without gallops or murmurs.  Abdomen: Nondistended appearing Msk:   Normal strength and tone for age. Extremities: No clubbing, cyanosis or edema.   Neuro: Alert and oriented X 3. Psych:  Good affect, responds appropriately  Labs:   Lab Results  Component Value Date   WBC 13.6 (H) 06/17/2021   HGB 15.0 06/17/2021   HCT 46.6 06/17/2021   MCV 90.1 06/17/2021   PLT 194 06/17/2021    Recent Labs  Lab 06/17/21 1557 06/18/21 0531 06/19/21 0442  NA 136   < > 139  K 3.7   < > 3.5  CL 101   < > 100  CO2 26   < > 29  BUN 28*   < > 47*  CREATININE 1.45*   < > 1.95*  CALCIUM 9.0   < > 8.6*  PROT 7.2  --   --   BILITOT 1.8*  --   --   ALKPHOS 63  --    --   ALT 19  --   --   AST 29  --   --   GLUCOSE 123*   < > 138*   < > = values in this interval not displayed.    Lab Results  Component Value Date   TROPONINI <0.03 02/17/2015    No results found for: CHOL No results found for: HDL No results found for: [facility]  No results found for: TRIG No results found for: CHOLHDL No results found for: LDLDIRECT    Radiology: DG Chest Left Decubitus  Result Date: 06/19/2021 CLINICAL DATA:  Shortness of breath EXAM: CHEST - LEFT DECUBITUS COMPARISON:  06/18/2021 FINDINGS: Left lateral decubitus image of the chest demonstrates a moderate size layering left pleural effusion. Associated left lung opacity. No appreciable right-sided pleural effusion. No pneumothorax. IMPRESSION: Moderate size layering left pleural effusion. Electronically Signed   By: Davina Poke D.O.   On: 06/19/2021 08:19   DG Chest Port 1 View  Result Date: 06/18/2021 CLINICAL DATA:  88 year old male with history of congestive heart failure. EXAM: PORTABLE CHEST 1 VIEW COMPARISON:  Chest x-ray 06/17/2021. FINDINGS: Lung volumes are low. Opacity throughout the left mid to lower lung obscuring the left hemidiaphragm in the left costophrenic sulcus indicative of atelectasis and/or consolidation with superimposed small to moderate left pleural effusion. Ill-defined opacity in the medial aspect of the right lung base also noted. No right pleural effusion. No pneumothorax. No evidence of pulmonary edema. Heart size is mildly enlarged. Upper mediastinal contours are within normal limits. Atherosclerotic calcifications in the thoracic aorta. IMPRESSION: 1. Atelectasis and/or consolidation in the left mid to lower lung and medial aspect of right lung base with small to moderate left pleural effusion. 2. Aortic atherosclerosis. 3. Mild cardiomegaly. Electronically Signed   By: Vinnie Langton M.D.   On: 06/18/2021 05:02   DG Chest Portable 1 View  Result Date: 06/17/2021 CLINICAL DATA:  SOB  EXAM: PORTABLE CHEST 1 VIEW COMPARISON:  May 2021 FINDINGS: The cardiomediastinal silhouette is unchanged and enlarged in contour.Perihilar vascular congestion. Likely small LEFT pleural effusion. Heterogeneous LEFT retrocardiac opacity. Mild interstitial prominence. Visualized abdomen is unremarkable. IMPRESSION: 1. Heterogeneous LEFT retrocardiac opacity. Differential considerations include infection, aspiration or atelectasis. 2. There is a background of likely pulmonary edema with a small LEFT pleural effusion Followup PA and lateral chest X-ray is recommended in 3-4 weeks following trial of antibiotic therapy to ensure resolution and exclude underlying malignancy. Electronically Signed   By: Valentino Saxon M.D.   On: 06/17/2021 16:16   ECHOCARDIOGRAM COMPLETE  Result Date: 06/18/2021    ECHOCARDIOGRAM REPORT   Patient Name:   DMARI SCHUBRING Date of Exam: 06/18/2021 Medical Rec #:  [id number]      Height:       69.5 in Accession #:    [id number]     Weight:       213.2 lb Date of Birth:  Apr 13, 1930      BSA:          2.133 m Patient Age:    6 years       BP:           148/77 mmHg Patient Gender: M              HR:           73 bpm. Exam Location:  [facility] Procedure: 2D Echo, Cardiac Doppler and Color Doppler Indications:     Congestive Heart Failure I50.9  History:         Patient has no prior history of Echocardiogram examinations.                  Signs/Symptoms:Murmur; Risk Factors:Hypertension.  Sonographer:     Alyse Low Roar Referring Phys:  [id number] Emeterio Reeve Diagnosing Phys: Isaias Cowman MD IMPRESSIONS  1. Left ventricular ejection fraction, by estimation, is 60 to 65%. The left ventricle has normal function.  The left ventricle has no regional wall motion abnormalities. There is mild left ventricular hypertrophy. Left ventricular diastolic parameters are indeterminate.  2. Right ventricular systolic function is normal. The right ventricular size is normal.  3. The mitral valve is normal in  structure. Mild mitral valve regurgitation. No evidence of mitral stenosis.  4. Tricuspid valve regurgitation is mild to moderate.  5. The aortic valve is normal in structure. Aortic valve regurgitation is mild to moderate. No aortic stenosis is present.  6. The inferior vena cava is normal in size with greater than 50% respiratory variability, suggesting right atrial pressure of 3 mmHg. FINDINGS  Left Ventricle: Left ventricular ejection fraction, by estimation, is 60 to 65%. The left ventricle has normal function. The left ventricle has no regional wall motion abnormalities. The left ventricular internal cavity size was normal in size. There is  mild left ventricular hypertrophy. Left ventricular diastolic parameters are indeterminate. Right Ventricle: The right ventricular size is normal. No increase in right ventricular wall thickness. Right ventricular systolic function is normal. Left Atrium: Left atrial size was normal in size. Right Atrium: Right atrial size was normal in size. Pericardium: There is no evidence of pericardial effusion. Mitral Valve: The mitral valve is normal in structure. Mild mitral valve regurgitation. No evidence of mitral valve stenosis. Tricuspid Valve: The tricuspid valve is normal in structure. Tricuspid valve regurgitation is mild to moderate. No evidence of tricuspid stenosis. Aortic Valve: The aortic valve is normal in structure. Aortic valve regurgitation is mild to moderate. No aortic stenosis is present. Aortic valve peak gradient measures 6.8 mmHg. Pulmonic Valve: The pulmonic valve was normal in structure. Pulmonic valve regurgitation is not visualized. No evidence of pulmonic stenosis. Aorta: The aortic root is normal in size and structure. Venous: The inferior vena cava is normal in size with greater than 50% respiratory variability, suggesting right atrial pressure of 3 mmHg. IAS/Shunts: No atrial level shunt detected by color flow Doppler.  LEFT VENTRICLE PLAX 2D LVIDd:          5.70 cm   Diastology LVIDs:         3.95 cm   LV e' medial:    5.55 cm/s LV PW:         1.26 cm   LV E/e' medial:  20.9 LV IVS:        1.48 cm   LV e' lateral:   8.38 cm/s LVOT diam:     2.20 cm   LV E/e' lateral: 13.8 LVOT Area:     3.80 cm  RIGHT VENTRICLE RV Mid diam:    3.52 cm RV S prime:     12.10 cm/s LEFT ATRIUM              Index LA diam:        5.10 cm  2.39 cm/m LA Vol (A2C):   154.0 ml 72.19 ml/m LA Vol (A4C):   52.9 ml  24.80 ml/m LA Biplane Vol: 93.0 ml  43.59 ml/m  AORTIC VALVE                 PULMONIC VALVE AV Area (Vmax): 2.65 cm     PV Vmax:          0.99 m/s AV Vmax:        130.00 cm/s  PV Peak grad:     3.9 mmHg AV Peak Grad:   6.8 mmHg     PR End Diast Vel: 5.95 msec LVOT Vmax:  90.60 cm/s   RVOT Peak grad:   2 mmHg  AORTA Ao Root diam: 3.40 cm Ao Asc diam:  3.70 cm MITRAL VALVE                TRICUSPID VALVE MV Area (PHT): 3.91 cm     TR Peak grad:   24.8 mmHg MV Decel Time: 194 msec     TR Vmax:        249.00 cm/s MV E velocity: 116.00 cm/s                             SHUNTS                             Systemic Diam: 2.20 cm Isaias Cowman MD Electronically signed by Isaias Cowman MD Signature Date/Time: 06/18/2021/12:59:27 PM    Final     EKG: Atrial fibrillation with right bundle branch block at a rate of 76 bpm  ASSESSMENT AND PLAN:   1. CAP, started on levaquin 06/18/21 2. HFpEF (LVEF 60-65%, with mildly elevated BNP, peripheral edema, with clinical improvement s/p IV furosemide '60mg'$  x 3 doses with bump in Cr from 1.45 - 1.95 today. GFR 32. Appears euvolemic today 3.  COPD exacerbation, on DuoNebs and IV Solu-Medrol 4.  Chronic atrial fibrillation, on Xarelto for stroke prevention 5.  Mildly elevated high-sensitivity troponin (74, 98), in the absence of chest pain, in the setting of CHF and COPD exacerbation, likely demand supply ischemia  Recommendations  1.  Agree with current therapy 2.  Hold diuretics as Cr increasing. 3.  Restart home  chlorthalidone 25 mg at discharge 4.  Defer full dose anticoagulation 5.  Echo resulted with preserved EF 60-65%  We will need follow-up with his regular cardiologist Dr. Nehemiah Massed to reestablish care 1 to 2 weeks after discharge.  This patient's plan of care was discussed and created with Dr. Donnelly Angelica and he is in agreement.    Signed: Alanson Puls Nikiesha Milford PA-C 06/19/2021, 9:07 AM

## 2021-06-19 NOTE — Procedures (Signed)
PROCEDURE SUMMARY: ? ?Successful US guided left thoracentesis. ?Yielded 300 mL of blood tinged fluid. ?Pt tolerated procedure well. ?No immediate complications. ? ?Specimen was sent for labs. ?CXR ordered. ? ?EBL < 1 mL ? ?Tsosie Billing D PA-C ?06/19/2021 ?2:32 PM ? ? ? ?

## 2021-06-19 NOTE — Progress Notes (Signed)
?  Progress Note  ? ?Date: 06/19/2021 ? ?Patient Name: Dennis Savage        ?MRN#: [id number] ? ?Clarification of diagnosis: ? ?AKI on CKD stage II ? ? ? ? ?

## 2021-06-19 NOTE — Assessment & Plan Note (Addendum)
Layering pleural effusion and moderate in nature.  Thoracentesis drew off 300 mL of blood-tinged fluid on 06/19/2021.  So far cultures negative.  Cytology noted low protein and LDH of 93 ?

## 2021-06-20 DIAGNOSIS — I5033 Acute on chronic diastolic (congestive) heart failure: Secondary | ICD-10-CM | POA: Diagnosis not present

## 2021-06-20 DIAGNOSIS — J189 Pneumonia, unspecified organism: Secondary | ICD-10-CM | POA: Diagnosis not present

## 2021-06-20 DIAGNOSIS — J441 Chronic obstructive pulmonary disease with (acute) exacerbation: Secondary | ICD-10-CM | POA: Diagnosis not present

## 2021-06-20 DIAGNOSIS — J90 Pleural effusion, not elsewhere classified: Secondary | ICD-10-CM | POA: Diagnosis not present

## 2021-06-20 LAB — CBC
HCT: 41.4 % (ref 39.0–52.0)
Hemoglobin: 13.7 g/dL (ref 13.0–17.0)
MCH: 29.3 pg (ref 26.0–34.0)
MCHC: 33.1 g/dL (ref 30.0–36.0)
MCV: 88.5 fL (ref 80.0–100.0)
Platelets: 235 10*3/uL (ref 150–400)
RBC: 4.68 MIL/uL (ref 4.22–5.81)
RDW: 13.4 % (ref 11.5–15.5)
WBC: 10.2 10*3/uL (ref 4.0–10.5)
nRBC: 0 % (ref 0.0–0.2)

## 2021-06-20 LAB — PROTEIN, BODY FLUID (OTHER): Total Protein, Body Fluid Other: 2.7 g/dL

## 2021-06-20 LAB — BASIC METABOLIC PANEL
Anion gap: 13 (ref 5–15)
BUN: 56 mg/dL — ABNORMAL HIGH (ref 8–23)
CO2: 27 mmol/L (ref 22–32)
Calcium: 8.8 mg/dL — ABNORMAL LOW (ref 8.9–10.3)
Chloride: 100 mmol/L (ref 98–111)
Creatinine, Ser: 1.81 mg/dL — ABNORMAL HIGH (ref 0.61–1.24)
GFR, Estimated: 35 mL/min — ABNORMAL LOW (ref >60)
Glucose, Bld: 120 mg/dL — ABNORMAL HIGH (ref 70–99)
Potassium: 3.1 mmol/L — ABNORMAL LOW (ref 3.5–5.1)
Sodium: 140 mmol/L (ref 135–145)

## 2021-06-20 MED ORDER — POTASSIUM CHLORIDE CRYS ER 20 MEQ PO TBCR
40.0000 meq | EXTENDED_RELEASE_TABLET | Freq: Once | ORAL | Status: AC
  Administered 2021-06-20: 40 meq via ORAL
  Filled 2021-06-20: qty 2

## 2021-06-20 MED ORDER — ALBUTEROL SULFATE HFA 108 (90 BASE) MCG/ACT IN AERS
2.0000 | INHALATION_SPRAY | Freq: Four times a day (QID) | RESPIRATORY_TRACT | Status: DC | PRN
  Filled 2021-06-20 (×2): qty 6.7

## 2021-06-20 MED ORDER — ALBUTEROL SULFATE (2.5 MG/3ML) 0.083% IN NEBU: 3.0000 mL | INHALATION_SOLUTION | Freq: Four times a day (QID) | RESPIRATORY_TRACT | Status: DC | PRN

## 2021-06-20 MED ORDER — SODIUM CHLORIDE 0.9 % IV BOLUS
500.0000 mL | Freq: Once | INTRAVENOUS | Status: AC
  Administered 2021-06-20: 500 mL via INTRAVENOUS

## 2021-06-20 NOTE — Care Management Important Message (Signed)
Important Message ? ?Patient Details  ?Name: Dennis Savage ?MRN: [id number] ?Date of Birth: 05-10-1929 ? ? ?Medicare Important Message Given:  Yes ? ? ? ? ?[name] ?06/20/2021, 9:58 AM ?

## 2021-06-20 NOTE — Progress Notes (Signed)
?Progress Note ? ? ?Patient: Dennis Savage LYY:[id number] DOB: 02-16-1930 DOA: 06/17/2021     3 ?DOS: the patient was seen and examined on 06/20/2021 ?  ?Brief hospital course: ?88 year old man with past medical history of COPD hypertension chronic atrial fibrillation, sleep apnea presented to the hospital with shortness of breath.  Initially thought to be congestive heart failure and was diuresed and his creatinine increased.  Treating for COPD and pneumonia.  The patient also had a layering left pleural effusion and he is status postthoracentesis of 300 mL on 06/19/2021.  The patient had a rough night with quite a bit of coughing and was shaky after nebulizer treatment this morning. ? ?Assessment and Plan: ?* COPD with exacerbation ([place]) ?Continue Solu-Medrol 40 mg daily.  Patient had coughing fit after nebulizer treatments.  Change nebulizers over to albuterol inhaler as needed.  Patient had a lot of coughing last night. ? ?CAP (community acquired pneumonia) ?Levaquin changed over to p.o.  So far culture of thoracentesis negative. ? ?Pleural effusion on left ?Layering pleural effusion and moderate in nature.  Thoracentesis drew off 300 mL of blood-tinged fluid on 06/19/2021.  So far cultures negative.  Cytology pending. ? ?Acute on chronic diastolic CHF (congestive heart failure) ([place]) ?Patient was initially diuresed aggressively and creatinine increased.  Likely more COPD exacerbation than CHF.  EF 60 to 65%. ? ?Acute kidney injury superimposed on CKD ([place]) ?Patient's baseline creatinine around 1.07 (CKD stage II).  Creatinine went up to 1.95 with diuresis.  Hold further diuresis.  With creatinine of 1.81 today I will give another fluid bolus.  Recheck creatinine tomorrow morning. ? ?Atrial fibrillation ([place]) ?Continue Xarelto.  With sinus pause and bradycardia with beta-blocker started on admission.  Holding beta-blocker at this time ? ?Adrenal mass, left ([place]) ?Seen on prior imaging.  PET scan in 2020 only showed mild  hypermetabolism.  In 2021 on CT scan of the chest showed a left adrenal mass have gotten as large as 4.1 cm.  Outpatient follow-up. ? ?Benign essential HTN ?Blood pressure stable ? ?Elevated troponin ?Borderline elevation likely demand ischemia ? ? ? ? ?  ? ?Subjective: Patient stated he had a rough night with coughing after nebulizer treatment.  He had a thoracentesis and he stated his breathing did not get much better after that.  He stated he had some wheezing overnight.  He did get very shaky after breathing treatment this morning.  Admitted with COPD exacerbation and pneumonia. ? ?Physical Exam: ?Vitals:  ? 06/20/21 0544 06/20/21 0714 06/20/21 0806 06/20/21 1127  ?BP: (!) 161/95 139/73  136/81  ?Pulse: 87 74  80  ?Resp: '18 19  18  '$ ?Temp: 97.7 ?F (36.5 ?C) 98.1 ?F (36.7 ?C)  98 ?F (36.7 ?C)  ?TempSrc:  Oral  Oral  ?SpO2: 90% 94% 94% 95%  ?Weight:      ?Height:      ? ?Physical Exam ?HENT:  ?   Head: Normocephalic.  ?   Mouth/Throat:  ?   Pharynx: No oropharyngeal exudate.  ?Eyes:  ?   General: Lids are normal.  ?   Conjunctiva/sclera: Conjunctivae normal.  ?Cardiovascular:  ?   Rate and Rhythm: Normal rate and regular rhythm.  ?   Heart sounds: Normal heart sounds, S1 normal and S2 normal.  ?Pulmonary:  ?   Breath sounds: Examination of the right-lower field reveals decreased breath sounds. Examination of the left-lower field reveals decreased breath sounds and rhonchi. Decreased breath sounds and rhonchi present. No wheezing  or rales.  ?Abdominal:  ?   Palpations: Abdomen is soft.  ?   Tenderness: There is no abdominal tenderness.  ?Musculoskeletal:  ?   Right lower leg: No swelling.  ?   Left lower leg: No swelling.  ?Skin: ?   General: Skin is warm.  ?   Findings: No rash.  ?Neurological:  ?   Mental Status: He is alert and oriented to person, place, and time.  ?  ?Data Reviewed: ?Creatinine is still elevated at 1.81, potassium 3.1, white blood cell count normal at 10.2 hemoglobin 13.7 ? ?Family  Communication: Updated patient's daughter on the phone ? ?Disposition: ?Status is: Inpatient ?Remains inpatient appropriate because: Patient had a rough night with regards to his breathing and coughing.  Still not feeling great.  Creatinine still elevated. ? ?Planned Discharge Destination: Home ? ? ?Author: ?Loletha Grayer, MD ?06/20/2021 3:16 PM ? ?For on call review [email or web address].  ?

## 2021-06-20 NOTE — Progress Notes (Signed)
[facility] Cardiology  CARDIOLOGY CONSULT NOTE  Patient ID: Dennis Savage MRN: [id number] DOB/AGE: 26-May-1929 88 y.o.  Admit date: 06/17/2021 Referring Physician [facility] Cardiologist Nehemiah Massed [phone])  Reason for Consultation congestive heart failure  HPI: 88 year old gentleman referred for evaluation of congestive heart failure.  Patient has a history of chronic atrial fibrillation on Xarelto for stroke prevention.  He also has a history of COPD, followed by Dr. Raul Del.  He presents with 4-day history of worsening shortness of breath and peripheral edema.  ECG revealed atrial fibrillation at a rate of 76 bpm with underlying right bundle branch block.  Chest x-ray revealed left pleural effusion.  Admission labs notable for mildly elevated BNP of 453.6.  High-sensitivity troponin was 74 and 98.  The patient denies chest pain.  Patient was treated with IV Solu-Medrol, DuoNebs, and IV furosemide with initial clinical improvement.  Interval History: -Feels better but still with some shortness of breath with ambulating.  - No LE edema, orthopnea, PND.  - Cough remains a problem.    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   Dizziness 02/25/2015   GERD (gastroesophageal reflux disease)    Heart murmur    Hypertension    Interstitial lung disease ([place]) 10/13/2013   LBP (low back pain) 11/03/2010   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   Peripheral vascular disease ([place]) 11/03/2010   Retinal  hemorrhage 03/16/2014    Past Surgical History:  Procedure Laterality Date   APPENDECTOMY     BACK SURGERY     CARPAL TUNNEL RELEASE Bilateral    EYE SURGERY Bilateral    Cataract Extraction with IOL   HERNIA REPAIR     Umbilical Hernia X 3   JOINT REPLACEMENT     bilat knees   NASAL SINUS SURGERY     REPLACEMENT TOTAL KNEE Bilateral    SHOULDER SURGERY Right    TONSILLECTOMY     TOTAL HIP ARTHROPLASTY Right 10/12/2015   Procedure: TOTAL HIP ARTHROPLASTY;  Surgeon: Dereck Leep, MD;  Location: [facility] ORS;  Service: Orthopedics;  Laterality: Right;    Medications Prior to Admission  Medication Sig Dispense Refill Last Dose   amLODipine (NORVASC) 10 MG tablet Take 10 mg by mouth daily.   06/17/2021 at AM   chlorthalidone (HYGROTON) 25 MG tablet Take 25 mg by mouth daily as needed.    06/17/2021   docusate sodium (COLACE) 250 MG capsule Take 250 mg by mouth daily as needed for constipation.   06/17/2021 at AM   Multiple Vitamins-Minerals (PRESERVISION AREDS 2 PO) Take by mouth.   06/17/2021 at AM   rivaroxaban (XARELTO) 20 MG TABS tablet Take 20 mg by mouth daily with supper.   06/16/2021 at NIGHT   acetaminophen (TYLENOL) 325 MG tablet Take 650 mg by mouth every 6 (six) hours as needed.   PRN at PRN   metoprolol tartrate (LOPRESSOR) 25 MG tablet Take 25 mg by mouth. (Patient not taking: Reported on 06/17/2021)   Not Taking  ondansetron (ZOFRAN ODT) 4 MG disintegrating tablet Take 1 tablet (4 mg total) by mouth every 6 (six) hours as needed for nausea or vomiting. (Patient not taking: Reported on 06/17/2021) 20 tablet 0 Not Taking   predniSONE (DELTASONE) 10 MG tablet  (Patient not taking: Reported on 06/17/2021)   Completed Course   traMADol (ULTRAM) 50 MG tablet Take 1 tablet (50 mg total) by mouth every 6 (six) hours as needed for moderate pain. (Patient not taking: Reported on 06/17/2021) 20 tablet 0 Not Taking   Social History   Socioeconomic History   Marital status: Single    Spouse name: Not  on file   Number of children: Not on file   Years of education: Not on file   Highest education level: Not on file  Occupational History   Not on file  Tobacco Use   Smoking status: Former    Packs/day: 1.00    Types: Cigarettes   Smokeless tobacco: Never  Substance and Sexual Activity   Alcohol use: No   Drug use: No   Sexual activity: Not on file  Other Topics Concern   Not on file  Social History Narrative   Not on file   Social Determinants of Health   Financial Resource Strain: Not on file  Food Insecurity: Not on file  Transportation Needs: Not on file  Physical Activity: Not on file  Stress: Not on file  Social Connections: Not on file  Intimate Partner Violence: Not on file    Family History  Problem Relation Age of Onset   Bladder Cancer Neg Hx    Kidney cancer Neg Hx    Prostate cancer Neg Hx       Review of systems complete and found to be negative unless listed above      PHYSICAL EXAM  General: Well developed elderly Caucasian male, well nourished, in no acute distress  HEENT:  Normocephalic and atraumatic Neck:  No JVD.  Lungs: normal respiratory effort on room air.  Decreased breath sounds left lower lung. Heart: Irregularly irregular rhythm with controlled rate. Normal S1 and S2 without gallops or murmurs.  Abdomen: Nondistended appearing Msk:   Normal strength and tone for age. Extremities: No clubbing, cyanosis or edema.   Neuro: Alert and oriented X 3. Psych:  Good affect, responds appropriately  Labs:   Lab Results  Component Value Date   WBC 10.2 06/20/2021   HGB 13.7 06/20/2021   HCT 41.4 06/20/2021   MCV 88.5 06/20/2021   PLT 235 06/20/2021    Recent Labs  Lab 06/17/21 1557 06/18/21 0531 06/20/21 0630  NA 136   < > 140  K 3.7   < > 3.1*  CL 101   < > 100  CO2 26   < > 27  BUN 28*   < > 56*  CREATININE 1.45*   < > 1.81*  CALCIUM 9.0   < > 8.8*  PROT 7.2  --   --   BILITOT 1.8*  --   --   ALKPHOS 63  --   --   ALT  19  --   --   AST 29  --   --   GLUCOSE 123*   < > 120*   < > = values in this interval not displayed.    Lab Results  Component Value Date   TROPONINI <0.03 02/17/2015    No results found for: CHOL No results found for: HDL No results found for: LDLCALC No  results found for: TRIG No results found for: CHOLHDL No results found for: LDLDIRECT    Radiology: DG Chest Left Decubitus  Result Date: 06/19/2021 CLINICAL DATA:  Shortness of breath EXAM: CHEST - LEFT DECUBITUS COMPARISON:  06/18/2021 FINDINGS: Left lateral decubitus image of the chest demonstrates a moderate size layering left pleural effusion. Associated left lung opacity. No appreciable right-sided pleural effusion. No pneumothorax. IMPRESSION: Moderate size layering left pleural effusion. Electronically Signed   By: Davina Poke D.O.   On: 06/19/2021 08:19   DG Chest Port 1 View  Result Date: 06/19/2021 CLINICAL DATA:  Status post thoracentesis EXAM: PORTABLE CHEST 1 VIEW COMPARISON:  Chest x-ray earlier the same day FINDINGS: Cardiomegaly and mediastinum appear unchanged. Calcified plaques in the aortic arch. Decreased mild opacities at the left lung base and lower lung zone. No pneumothorax visualized. IMPRESSION: 1. No pneumothorax identified. 2. Decreased likely small left pleural effusion with associated atelectasis. Electronically Signed   By: Ofilia Neas M.D.   On: 06/19/2021 14:48   DG Chest Port 1 View  Result Date: 06/18/2021 CLINICAL DATA:  88 year old male with history of congestive heart failure. EXAM: PORTABLE CHEST 1 VIEW COMPARISON:  Chest x-ray 06/17/2021. FINDINGS: Lung volumes are low. Opacity throughout the left mid to lower lung obscuring the left hemidiaphragm in the left costophrenic sulcus indicative of atelectasis and/or consolidation with superimposed small to moderate left pleural effusion. Ill-defined opacity in the medial aspect of the right lung base also noted. No right pleural effusion. No  pneumothorax. No evidence of pulmonary edema. Heart size is mildly enlarged. Upper mediastinal contours are within normal limits. Atherosclerotic calcifications in the thoracic aorta. IMPRESSION: 1. Atelectasis and/or consolidation in the left mid to lower lung and medial aspect of right lung base with small to moderate left pleural effusion. 2. Aortic atherosclerosis. 3. Mild cardiomegaly. Electronically Signed   By: Vinnie Langton M.D.   On: 06/18/2021 05:02   DG Chest Portable 1 View  Result Date: 06/17/2021 CLINICAL DATA:  SOB EXAM: PORTABLE CHEST 1 VIEW COMPARISON:  May 2021 FINDINGS: The cardiomediastinal silhouette is unchanged and enlarged in contour.Perihilar vascular congestion. Likely small LEFT pleural effusion. Heterogeneous LEFT retrocardiac opacity. Mild interstitial prominence. Visualized abdomen is unremarkable. IMPRESSION: 1. Heterogeneous LEFT retrocardiac opacity. Differential considerations include infection, aspiration or atelectasis. 2. There is a background of likely pulmonary edema with a small LEFT pleural effusion Followup PA and lateral chest X-ray is recommended in 3-4 weeks following trial of antibiotic therapy to ensure resolution and exclude underlying malignancy. Electronically Signed   By: Valentino Saxon M.D.   On: 06/17/2021 16:16   ECHOCARDIOGRAM COMPLETE  Result Date: 06/18/2021    ECHOCARDIOGRAM REPORT   Patient Name:   Dennis Savage Date of Exam: 06/18/2021 Medical Rec #:  [id number]      Height:       69.5 in Accession #:    [id number]     Weight:       213.2 lb Date of Birth:  February 27, 1930      BSA:          2.133 m Patient Age:    70 years       BP:           148/77 mmHg Patient Gender: M              HR:           73 bpm. Exam Location:  [facility] Procedure: 2D Echo, Cardiac Doppler and Color Doppler  Indications:     Congestive Heart Failure I50.9  History:         Patient has no prior history of Echocardiogram examinations.                  Signs/Symptoms:Murmur; Risk  Factors:Hypertension.  Sonographer:     Alyse Low Roar Referring Phys:  [id number] Emeterio Reeve Diagnosing Phys: Isaias Cowman MD IMPRESSIONS  1. Left ventricular ejection fraction, by estimation, is 60 to 65%. The left ventricle has normal function. The left ventricle has no regional wall motion abnormalities. There is mild left ventricular hypertrophy. Left ventricular diastolic parameters are indeterminate.  2. Right ventricular systolic function is normal. The right ventricular size is normal.  3. The mitral valve is normal in structure. Mild mitral valve regurgitation. No evidence of mitral stenosis.  4. Tricuspid valve regurgitation is mild to moderate.  5. The aortic valve is normal in structure. Aortic valve regurgitation is mild to moderate. No aortic stenosis is present.  6. The inferior vena cava is normal in size with greater than 50% respiratory variability, suggesting right atrial pressure of 3 mmHg. FINDINGS  Left Ventricle: Left ventricular ejection fraction, by estimation, is 60 to 65%. The left ventricle has normal function. The left ventricle has no regional wall motion abnormalities. The left ventricular internal cavity size was normal in size. There is  mild left ventricular hypertrophy. Left ventricular diastolic parameters are indeterminate. Right Ventricle: The right ventricular size is normal. No increase in right ventricular wall thickness. Right ventricular systolic function is normal. Left Atrium: Left atrial size was normal in size. Right Atrium: Right atrial size was normal in size. Pericardium: There is no evidence of pericardial effusion. Mitral Valve: The mitral valve is normal in structure. Mild mitral valve regurgitation. No evidence of mitral valve stenosis. Tricuspid Valve: The tricuspid valve is normal in structure. Tricuspid valve regurgitation is mild to moderate. No evidence of tricuspid stenosis. Aortic Valve: The aortic valve is normal in structure. Aortic valve  regurgitation is mild to moderate. No aortic stenosis is present. Aortic valve peak gradient measures 6.8 mmHg. Pulmonic Valve: The pulmonic valve was normal in structure. Pulmonic valve regurgitation is not visualized. No evidence of pulmonic stenosis. Aorta: The aortic root is normal in size and structure. Venous: The inferior vena cava is normal in size with greater than 50% respiratory variability, suggesting right atrial pressure of 3 mmHg. IAS/Shunts: No atrial level shunt detected by color flow Doppler.  LEFT VENTRICLE PLAX 2D LVIDd:         5.70 cm   Diastology LVIDs:         3.95 cm   LV e' medial:    5.55 cm/s LV PW:         1.26 cm   LV E/e' medial:  20.9 LV IVS:        1.48 cm   LV e' lateral:   8.38 cm/s LVOT diam:     2.20 cm   LV E/e' lateral: 13.8 LVOT Area:     3.80 cm  RIGHT VENTRICLE RV Mid diam:    3.52 cm RV S prime:     12.10 cm/s LEFT ATRIUM              Index LA diam:        5.10 cm  2.39 cm/m LA Vol (A2C):   154.0 ml 72.19 ml/m LA Vol (A4C):   52.9 ml  24.80 ml/m LA Biplane Vol: 93.0 ml  43.59 ml/m  AORTIC VALVE                 PULMONIC VALVE AV Area (Vmax): 2.65 cm     PV Vmax:          0.99 m/s AV Vmax:        130.00 cm/s  PV Peak grad:     3.9 mmHg AV Peak Grad:   6.8 mmHg     PR End Diast Vel: 5.95 msec LVOT Vmax:      90.60 cm/s   RVOT Peak grad:   2 mmHg  AORTA Ao Root diam: 3.40 cm Ao Asc diam:  3.70 cm MITRAL VALVE                TRICUSPID VALVE MV Area (PHT): 3.91 cm     TR Peak grad:   24.8 mmHg MV Decel Time: 194 msec     TR Vmax:        249.00 cm/s MV E velocity: 116.00 cm/s                             SHUNTS                             Systemic Diam: 2.20 cm Isaias Cowman MD Electronically signed by Isaias Cowman MD Signature Date/Time: 06/18/2021/12:59:27 PM    Final    US THORACENTESIS ASP PLEURAL SPACE W/IMG GUIDE  Result Date: 06/19/2021 INDICATION: Patient with shortness of breath and pleural effusion request received for diagnostic and therapeutic  thoracentesis. EXAM: ULTRASOUND GUIDED LEFT THORACENTESIS MEDICATIONS: Local 1% lidocaine only. COMPLICATIONS: None immediate. PROCEDURE: An ultrasound guided thoracentesis was thoroughly discussed with the patient and questions answered. The benefits, risks, alternatives and complications were also discussed. The patient understands and wishes to proceed with the procedure. Written consent was obtained. Ultrasound was performed to localize and mark an adequate pocket of fluid in the left chest. The area was then prepped and draped in the normal sterile fashion. 1% Lidocaine was used for local anesthesia. Under ultrasound guidance a 19 gauge, 7-cm, Yueh catheter was introduced. Thoracentesis was performed. The catheter was removed and a dressing applied. FINDINGS: A total of approximately 300 mL of blood-tinged fluid was removed. Samples were sent to the laboratory as requested by the clinical team. IMPRESSION: Successful ultrasound guided left thoracentesis yielding 300 mL of pleural fluid. This exam was performed by Tsosie Billing PA-C, and was supervised and interpreted by Dr. Kathlene Cote. Electronically Signed   By: Aletta Edouard M.D.   On: 06/19/2021 14:38    EKG: Atrial fibrillation with right bundle branch block at a rate of 76 bpm  ASSESSMENT AND PLAN:   1. CAP, started on levaquin 06/18/21 2. HFpEF (LVEF 60-65%, with mildly elevated BNP, peripheral edema, with clinical improvement s/p IV furosemide '60mg'$  x 3 doses with bump in Cr from 1.45 - 1.95 on 3/6. GFR 32. Appears euvolemic today 3.  COPD exacerbation, on DuoNebs and IV Solu-Medrol 4.  Chronic atrial fibrillation, on Xarelto for stroke prevention 5.  Mildly elevated high-sensitivity troponin (74, 98), in the absence of chest pain, in the setting of CHF and COPD exacerbation, likely demand supply ischemia  Recommendations  1.  Agree with current therapy 2.  Hold diuretics again today (not on home loop diuretic) 3.  Restart home  chlorthalidone 25 mg after DC pending renal recovery 4.  Continue Xarelto 15  mg --> If renal function improves would discharge on home 20 mg 5.  Echo resulted with preserved EF 60-65%  We will need follow-up with his regular cardiologist Dr. Nehemiah Massed to reestablish care 1 to 2 weeks after discharge. Cardiology will sign off.   This patient's plan of care was discussed and created with Dr. Donnelly Angelica and he is in agreement.    Signed: Andrez Grime MD 06/20/2021, 8:16 AM

## 2021-06-20 NOTE — Progress Notes (Signed)
Physical Therapy Treatment ?Patient Details ?Name: Dennis Savage ?MRN: [id number] ?DOB: 06/18/1929 ?Today's Date: 06/20/2021 ? ? ?History of Present Illness Dennis Savage is a 88 y.o. male with a past medical history of atrial fibrillation on Xarelto, hypertension, who presents to the emergency department for shortness of breath. ? ?  ?PT Comments  ? ? Patient is agreeable to PT. Patient is making progress with functional independence. The patient ambulated a lap in hallway and in the room with rolling walker on room air with Sp02 92-95%. Mild shortness of breath noted. Education provided on a establishing a walking schedule for home, progressing activity slowly, and tips for energy conservation. PT will continue to follow to maximize independence and facilitate return to prior level of function. Anticipate patient can return home with intermittent supervision from family. He is not interested in home health at this time.  ? ?  ?Recommendations for follow up therapy are one component of a multi-disciplinary discharge planning process, led by the attending physician.  Recommendations may be updated based on patient status, additional functional criteria and insurance authorization. ? ?Follow Up Recommendations ? No PT follow up ?  ?  ?Assistance Recommended at Discharge Intermittent Supervision/Assistance  ?Patient can return home with the following A little help with bathing/dressing/bathroom;A little help with walking and/or transfers;Assist for transportation;Help with stairs or ramp for entrance ?  ?Equipment Recommendations ? None recommended by PT  ?  ?Recommendations for Other Services   ? ? ?  ?Precautions / Restrictions Precautions ?Precautions: Fall ?Restrictions ?Weight Bearing Restrictions: No  ?  ? ?Mobility ? Bed Mobility ?Overal bed mobility: Modified Independent ?  ?  ?  ?  ?  ?  ?General bed mobility comments: increased time required, no physical assistance needed ?  ? ?Transfers ?Overall transfer  level: Needs assistance ?Equipment used: Rolling walker (2 wheels) ?Transfers: Sit to/from Stand ?Sit to Stand: Supervision ?  ?  ?  ?  ?  ?General transfer comment: no physical assistance required for sit to stand transfers. supervision for safety ?  ? ?Ambulation/Gait ?Ambulation/Gait assistance: Min guard, Supervision ?Gait Distance (Feet): 175 Feet ?Assistive device: Rolling walker (2 wheels) ?Gait Pattern/deviations: Step-through pattern ?Gait velocity: decreased ?  ?  ?General Gait Details: patient ambulated in hallway on room air with Sp02 94-95%. encouraged patient to use rolling walker for safety with ambulation and provided education for energy conservation techniques at home ? ? ?Stairs ?  ?  ?  ?  ?  ? ? ?Wheelchair Mobility ?  ? ?Modified Rankin (Stroke Patients Only) ?  ? ? ?  ?Balance   ?  ?  ?  ?  ?  ?  ?  ?  ?  ?  ?  ?  ?  ?  ?  ?  ?  ?  ?  ? ?  ?Cognition Arousal/Alertness: Awake/alert ?Behavior During Therapy: [facility] for tasks assessed/performed ?Overall Cognitive Status: Within Functional Limits for tasks assessed ?  ?  ?  ?  ?  ?  ?  ?  ?  ?  ?  ?  ?  ?  ?  ?  ?  ?  ?  ? ?  ?Exercises   ? ?  ?General Comments   ?  ?  ? ?Pertinent Vitals/Pain Pain Assessment ?Pain Assessment: No/denies pain  ? ? ?Home Living   ?  ?  ?  ?  ?  ?  ?  ?  ?  ?   ?  ?  Prior Function    ?  ?  ?   ? ?PT Goals (current goals can now be found in the care plan section) Acute Rehab PT Goals ?Patient Stated Goal: to return home ?PT Goal Formulation: With patient ?Time For Goal Achievement: 06/26/21 ?Potential to Achieve Goals: Good ?Progress towards PT goals: Progressing toward goals ? ?  ?Frequency ? ? ? Min 2X/week ? ? ? ?  ?PT Plan Current plan remains appropriate  ? ? ?Co-evaluation   ?  ?  ?  ?  ? ?  ?AM-PAC PT "6 Clicks" Mobility   ?Outcome Measure ? Help needed turning from your back to your side while in a flat bed without using bedrails?: None ?Help needed moving from lying on your back to sitting on the side of a flat  bed without using bedrails?: None ?Help needed moving to and from a bed to a chair (including a wheelchair)?: A Little ?Help needed standing up from a chair using your arms (e.g., wheelchair or bedside chair)?: A Little ?Help needed to walk in hospital room?: A Little ?Help needed climbing 3-5 steps with a railing? : A Little ?6 Click Score: 20 ? ?  ?End of Session   ?  ?  ?  ?PT Visit Diagnosis: Muscle weakness (generalized) (M62.81) ?  ? ? ?Time: 3754-3606 ?PT Time Calculation (min) (ACUTE ONLY): 19 min ? ?Charges:  $Therapeutic Activity: 8-22 mins          ?          ?Minna Merritts, PT, MPT ? ? ? ?Dennis Savage ?06/20/2021, 2:57 PM ? ?

## 2021-06-20 NOTE — Progress Notes (Signed)
?  Transition of Care (TOC) Screening Note ? ? ?Patient Details  ?Name: Dennis Savage ?Date of Birth: September 23, 1929 ? ? ?Transition of Care (TOC) CM/SW Contact:    ?Alberteen Sam, LCSW ?Phone Number: ?06/20/2021, 8:47 AM ? ? ? ?Transition of Care Department [facility]) has reviewed patient and no TOC needs have been identified at this time. We will continue to monitor patient advancement through interdisciplinary progression rounds. If new patient transition needs arise, please place a TOC consult. ? Pricilla Riffle, [place] ?[phone] ? ?

## 2021-06-20 NOTE — Hospital Course (Addendum)
88 year old man with past medical history of COPD hypertension chronic atrial fibrillation, sleep apnea presented to the hospital with shortness of breath.  Initially thought to be congestive heart failure and was diuresed and his creatinine increased.  Diuretics were stopped and renal function has been slowly improving.  Treating for COPD and pneumonia.  The patient also had a layering left pleural effusion and he is status postthoracentesis of 300 mL on 06/19/2021.   ?

## 2021-06-21 DIAGNOSIS — I5033 Acute on chronic diastolic (congestive) heart failure: Secondary | ICD-10-CM | POA: Diagnosis not present

## 2021-06-21 DIAGNOSIS — I482 Chronic atrial fibrillation, unspecified: Secondary | ICD-10-CM | POA: Diagnosis not present

## 2021-06-21 DIAGNOSIS — J441 Chronic obstructive pulmonary disease with (acute) exacerbation: Secondary | ICD-10-CM | POA: Diagnosis not present

## 2021-06-21 DIAGNOSIS — E669 Obesity, unspecified: Secondary | ICD-10-CM | POA: Diagnosis present

## 2021-06-21 DIAGNOSIS — N179 Acute kidney failure, unspecified: Secondary | ICD-10-CM | POA: Diagnosis not present

## 2021-06-21 LAB — BASIC METABOLIC PANEL
Anion gap: 9 (ref 5–15)
BUN: 45 mg/dL — ABNORMAL HIGH (ref 8–23)
CO2: 27 mmol/L (ref 22–32)
Calcium: 8.6 mg/dL — ABNORMAL LOW (ref 8.9–10.3)
Chloride: 104 mmol/L (ref 98–111)
Creatinine, Ser: 1.65 mg/dL — ABNORMAL HIGH (ref 0.61–1.24)
GFR, Estimated: 39 mL/min — ABNORMAL LOW (ref >60)
Glucose, Bld: 98 mg/dL (ref 70–99)
Potassium: 3.7 mmol/L (ref 3.5–5.1)
Sodium: 140 mmol/L (ref 135–145)

## 2021-06-21 LAB — CYTOLOGY - NON PAP

## 2021-06-21 LAB — CBC
HCT: 41.3 % (ref 39.0–52.0)
Hemoglobin: 13.4 g/dL (ref 13.0–17.0)
MCH: 28.9 pg (ref 26.0–34.0)
MCHC: 32.4 g/dL (ref 30.0–36.0)
MCV: 89 fL (ref 80.0–100.0)
Platelets: 223 10*3/uL (ref 150–400)
RBC: 4.64 MIL/uL (ref 4.22–5.81)
RDW: 13.6 % (ref 11.5–15.5)
WBC: 8.9 10*3/uL (ref 4.0–10.5)
nRBC: 0 % (ref 0.0–0.2)

## 2021-06-21 MED ORDER — EMPAGLIFLOZIN 10 MG PO TABS
10.0000 mg | ORAL_TABLET | Freq: Every day | ORAL | Status: DC
  Administered 2021-06-21 – 2021-06-23 (×3): 10 mg via ORAL
  Filled 2021-06-21 (×3): qty 1

## 2021-06-21 MED ORDER — PREDNISONE 20 MG PO TABS
40.0000 mg | ORAL_TABLET | Freq: Every day | ORAL | Status: AC
  Administered 2021-06-22: 08:00:00 40 mg via ORAL
  Filled 2021-06-21: qty 2

## 2021-06-21 MED ORDER — PREDNISONE 20 MG PO TABS
30.0000 mg | ORAL_TABLET | Freq: Every day | ORAL | Status: AC
  Administered 2021-06-23: 30 mg via ORAL
  Filled 2021-06-21: qty 1

## 2021-06-21 MED ORDER — PREDNISONE 10 MG PO TABS: 10.0000 mg | ORAL_TABLET | Freq: Every day | ORAL | Status: DC

## 2021-06-21 MED ORDER — PREDNISONE 20 MG PO TABS: 20.0000 mg | ORAL_TABLET | Freq: Every day | ORAL | Status: DC

## 2021-06-21 NOTE — Progress Notes (Signed)
[facility] Progress Note ? ?Patient: Dennis Savage    YYT:[id number]  DOA: 06/17/2021    ?Date of Service: the patient was seen and examined on 06/21/2021 ? ?Brief hospital course: ?88 year old man with past medical history of COPD hypertension chronic atrial fibrillation, sleep apnea presented to the hospital with shortness of breath.  Initially thought to be congestive heart failure and was diuresed and his creatinine increased.  Treating for COPD and pneumonia.  The patient also had a layering left pleural effusion and he is status postthoracentesis of 300 mL on 06/19/2021.  Feeling better today, breathing more stable. ? ?Assessment and Plan: ?Assessment and Plan: ?* COPD with exacerbation ([name]) ?Changed over to p.o. prednisone.  Continue nebulizers.  Improved.  Currently on room air.  ? ?CAP (community acquired pneumonia) ?Completed course of Levaquin.  So far culture of thoracentesis negative. ? ?Pleural effusion on left ?Layering pleural effusion and moderate in nature.  Thoracentesis drew off 300 mL of blood-tinged fluid on 06/19/2021.  So far cultures negative.  Cytology pending. ? ?Acute on chronic diastolic CHF (congestive heart failure) ([name]) ?Patient was initially diuresed aggressively and creatinine increased.  Likely more COPD exacerbation than CHF.  EF 60 to 65%.  To date, has diuresed 6.2 L and is -4.2 L deficient ? ?Acute kidney injury superimposed on CKD ([name]) ?Patient's baseline creatinine around 1.07 (CKD stage II).  On admission at 1.45.  Creatinine went up to 1.95 with diuresis, so further diuresis held and given some fluid with creatinine 1.65 today. ? ?Atrial fibrillation ([name]) ?Continue Xarelto.  With sinus pause and bradycardia with beta-blocker started on admission.  Holding beta-blocker at this time ? ?Adrenal mass, left ([name]) ?Seen on prior imaging.  PET scan in 2020 only showed mild hypermetabolism.  In 2021 on CT scan of the chest showed a left adrenal mass have gotten as large as 4.1  cm.  Outpatient follow-up. ? ?Benign essential HTN ?Blood pressure stable ? ?Obesity (BMI 30-39.9) ?Meets criteria with BMI greater than 30 ? ?Elevated troponin ?Borderline elevation likely demand ischemia ? ? ? ? ? ? ?Body mass index is 30.55 kg/m?.  ?  ?   ? ?Consultants: ?Cardiology ?Interventional radiology ? ?Procedures: ?Status post left-sided thoracentesis 300 cc of fluid removed ? ?Antimicrobials: ?Levaquin-completed course ? ?Code Status: Full code ? ? ?Subjective: Patient states that bleeding better.  Denies any pain. ? ?Objective: ?Vital signs were reviewed and unremarkable. ?Vitals:  ? 06/21/21 0804 06/21/21 1204  ?BP: (!) 144/69 (!) 146/70  ?Pulse: 88 (!) 54  ?Resp: 17 18  ?Temp: (!) 97.5 ?F (36.4 ?C) (!) 97.3 ?F (36.3 ?C)  ?SpO2: 94% 97%  ? ? ?Intake/Output Summary (Last 24 hours) at 06/21/2021 1258 ?Last data filed at 06/21/2021 1028 ?Gross per 24 hour  ?Intake 1180 ml  ?Output 1825 ml  ?Net -645 ml  ? ?Filed Weights  ? 06/19/21 0445 06/20/21 0806 06/21/21 0420  ?Weight: 95.6 kg 95 kg 95.2 kg  ? ?Body mass index is 30.55 kg/m?. ? ?Exam: ? ?General: Alert and oriented x3, no acute distress ?HEENT: Normocephalic, atraumatic, mucous membranes are slightly dry ?Cardiovascular: Regular rate and rhythm, S1-S2 ?Respiratory: Decreased breath sounds throughout ?Abdomen: Soft, nontender, nondistended, positive bowel sounds ?Musculoskeletal: No clubbing or cyanosis or edema ?Skin: No skin breaks, tears or lesions ?Psychiatry: Appropriate, no evidence of psychoses ?Neurology: No focal deficits ? ?Data Reviewed: ?Renal function slowly improving, creatinine down to 1.65 with GFR of 39 ? ?Disposition:  ?Status is: Inpatient ?Remains inpatient  appropriate because: Further improvement in renal function ?  ? ?Family Communication: Left message for family  ?DVT Prophylaxis: ? ?Rivaroxaban (XARELTO) tablet 15 mg  ? ? ?Author: ?Annita Brod ,MD ?06/21/2021 12:58 PM ? ?To reach On-call, see care teams to locate the  attending and reach out via [email or web address]. ?Between 7PM-7AM, please contact night-coverage ?If you still have difficulty reaching the attending provider, please page the [facility] (Director on Call) for [facility] on amion for assistance. ? ?

## 2021-06-21 NOTE — Progress Notes (Signed)
Assumed care of pt at 1900. A&O x4. RA. C/o headache overnight, tylenol given w/ good effect. Medication administration per MAR. See flowsheets for full assessment. Call bell within reach, able to make needs known. Comfort and safety maintained.  ?

## 2021-06-21 NOTE — Assessment & Plan Note (Signed)
Meets criteria with BMI greater than 30 ?

## 2021-06-21 NOTE — Progress Notes (Signed)
Mobility Specialist - Progress Note ? ? 06/21/21 1136  ?Mobility  ?Activity Ambulated with assistance in hallway;Stood at bedside;Dangled on edge of bed  ?Level of Assistance Modified independent, requires aide device or extra time  ?Assistive Device Standard walker  ?Distance Ambulated (ft) 175 ft  ?Activity Response Tolerated well  ?$Mobility charge 1 Mobility  ? ? ? ?Pre-mobility: 59 HR, BP, 93% SpO2 ?During mobility: 69 HR, BP, 96% SpO2 ? ?Pt sleeping using RA upon arrival. Pt sat EOB + STS with ModI and ambulated 155f with supervision. Pt voiced no complaints. Pt returned to bed with needs in reach and MD present. ? ?MMerrily Brittle?Mobility Specialist ?06/21/21, 11:40 AM ? ? ? ? ?

## 2021-06-22 LAB — BASIC METABOLIC PANEL
Anion gap: 8 (ref 5–15)
BUN: 41 mg/dL — ABNORMAL HIGH (ref 8–23)
CO2: 27 mmol/L (ref 22–32)
Calcium: 8.6 mg/dL — ABNORMAL LOW (ref 8.9–10.3)
Chloride: 105 mmol/L (ref 98–111)
Creatinine, Ser: 1.5 mg/dL — ABNORMAL HIGH (ref 0.61–1.24)
GFR, Estimated: 44 mL/min — ABNORMAL LOW (ref >60)
Glucose, Bld: 96 mg/dL (ref 70–99)
Potassium: 3.7 mmol/L (ref 3.5–5.1)
Sodium: 140 mmol/L (ref 135–145)

## 2021-06-22 LAB — CULTURE, BLOOD (ROUTINE X 2)
Culture: NO GROWTH
Culture: NO GROWTH
Special Requests: ADEQUATE

## 2021-06-22 MED ORDER — SODIUM CHLORIDE 0.9 % IV SOLN: 250.0000 mL | INTRAVENOUS | Status: DC | PRN

## 2021-06-22 NOTE — Progress Notes (Signed)
[facility] Progress Note ? ?Patient: Dennis Savage    SVX:[id number]  DOA: 06/17/2021    ?Date of Service: the patient was seen and examined on 06/22/2021 ? ?Brief hospital course: ?88 year old man with past medical history of COPD hypertension chronic atrial fibrillation, sleep apnea presented to the hospital with shortness of breath.  Initially thought to be congestive heart failure and was diuresed and his creatinine increased.  Diuretics were stopped and renal function has been slowly improving.  Treating for COPD and pneumonia.  The patient also had a layering left pleural effusion and he is status postthoracentesis of 300 mL on 06/19/2021.   ? ?Assessment and Plan: ?Assessment and Plan: ?* COPD with exacerbation ([place]) ?Changed over to p.o. prednisone.  Continue nebulizers.  Improved.  Currently on room air.  ? ?CAP (community acquired pneumonia) ?Completed course of Levaquin.  So far culture of thoracentesis negative. ? ?Pleural effusion on left ?Layering pleural effusion and moderate in nature.  Thoracentesis drew off 300 mL of blood-tinged fluid on 06/19/2021.  So far cultures negative.  Cytology pending. ? ?Acute on chronic diastolic CHF (congestive heart failure) ([place]) ?Patient was initially diuresed aggressively and creatinine increased.  Likely more COPD exacerbation than CHF.  EF 60 to 65%.  To date, has diuresed 7.8 L and is -4.7 L deficient ? ?Acute kidney injury superimposed on CKD ([place]) ?Patient's baseline creatinine around 1.07 (CKD stage II).  On admission at 1.45.  Creatinine went up to 1.95 with diuresis, so further diuresis held and given some fluid with creatinine 1.5 today. ? ?Atrial fibrillation ([place]) ?Continue Xarelto.  With sinus pause and bradycardia with beta-blocker started on admission.  Holding beta-blocker at this time ? ?Adrenal mass, left ([place]) ?Seen on prior imaging.  PET scan in 2020 only showed mild hypermetabolism.  In 2021 on CT scan of the chest showed a left adrenal mass  have gotten as large as 4.1 cm.  Outpatient follow-up. ? ?Benign essential HTN ?Blood pressure starting to trend upward.  Resuming other home medications. ? ?Obesity (BMI 30-39.9) ?Meets criteria with BMI greater than 30 ? ?Elevated troponin ?Borderline elevation likely demand ischemia ? ? ? ? ? ? ?Body mass index is 29.64 kg/m?.  ?  ?   ? ?Consultants: ?Cardiology ?Interventional radiology ? ?Procedures: ?Status post left-sided thoracentesis 300 cc of fluid removed ? ?Antimicrobials: ?Levaquin-completed course ? ?Code Status: Full code ? ? ?Subjective: Patient states breathing comfortable ? ?Objective: ?Vital signs were reviewed and unremarkable. ?Vitals:  ? 06/22/21 0754 06/22/21 1121  ?BP: (!) 150/67 (!) 156/86  ?Pulse: (!) 49 (!) 52  ?Resp: 16 16  ?Temp: (!) 97.4 ?F (36.3 ?C) 97.9 ?F (36.6 ?C)  ?SpO2: 97% 97%  ? ? ?Intake/Output Summary (Last 24 hours) at 06/22/2021 1340 ?Last data filed at 06/22/2021 1300 ?Gross per 24 hour  ?Intake 720 ml  ?Output 1630 ml  ?Net -910 ml  ? ? ?Filed Weights  ? 06/20/21 0806 06/21/21 0420 06/22/21 0412  ?Weight: 95 kg 95.2 kg 92.4 kg  ? ?Body mass index is 29.64 kg/m?. ? ?Exam: ? ?General: Alert and oriented x3, no acute distress ?HEENT: Normocephalic, atraumatic, mucous membranes are slightly dry ?Cardiovascular: Regular rate and rhythm, S1-S2 ?Respiratory: Decreased breath sounds throughout ?Abdomen: Soft, nontender, nondistended, positive bowel sounds ?Musculoskeletal: No clubbing or cyanosis or edema ?Skin: No skin breaks, tears or lesions ?Psychiatry: Appropriate, no evidence of psychoses ?Neurology: No focal deficits ? ?Data Reviewed: ?Renal function slowly improving, creatinine down to 1.65 with GFR  of 39 ? ?Disposition:  ?Status is: Inpatient ?Remains inpatient appropriate because: Further improvement in renal function ?Anticipated discharge 3/10 ? ?Family Communication: Left message for family  ?DVT Prophylaxis: ? ?Rivaroxaban (XARELTO) tablet 15 mg  ? ? ?Author: ?Annita Brod ,MD ?06/22/2021 1:40 PM ? ?To reach On-call, see care teams to locate the attending and reach out via [email or web address]. ?Between 7PM-7AM, please contact night-coverage ?If you still have difficulty reaching the attending provider, please page the [facility] (Director on Call) for [facility] on amion for assistance. ? ?

## 2021-06-22 NOTE — Progress Notes (Signed)
PT Cancellation Note ? ?Patient Details ?Name: Dennis Savage ?MRN: [id number] ?DOB: 1929/04/22 ? ? ?Cancelled Treatment:    Reason Eval/Treat Not Completed: Patient declined, no reason specified. Pt politely declining PT at this time as pt worked with mobility specialist earlier today. Will re-attempt at later time/date. ? ? ?Salem Caster. Fairly IV, PT, DPT ?Physical Therapist- [place]  ?[facility]  ?06/22/2021, 2:48 PM ?

## 2021-06-22 NOTE — Care Management Important Message (Signed)
Important Message ? ?Patient Details  ?Name: Dennis Savage ?MRN: [id number] ?Date of Birth: 1929/06/27 ? ? ?Medicare Important Message Given:  Yes ? ? ? ? ?[name] ?06/22/2021, 3:21 PM ?

## 2021-06-22 NOTE — Progress Notes (Signed)
Mobility Specialist - Progress Note ? ? 06/22/21 1200  ?Mobility  ?Activity Ambulated with assistance in hallway;Transferred to/from BSC;Dangled on edge of bed  ?Level of Assistance Standby assist, set-up cues, supervision of patient - no hands on  ?Assistive Device None  ?Distance Ambulated (ft) 170 ft  ?Activity Response Tolerated well  ?$Mobility charge 1 Mobility  ? ? ? ?Pre-mobility: 96 HR, BP, 96% SpO2 ?During mobility: 101 HR, BP, 96% SpO2 ? ?Pt in bathroom upon arrival using RA --- BM noted. Pt completed STS indep and ambulated 1 lap with supervision ---- voices no complaints. Returns to bed with needs in reach. ? ?Dennis Savage ?Mobility Specialist ?06/22/21, 12:52 PM ? ? ? ? ?

## 2021-06-23 DIAGNOSIS — E669 Obesity, unspecified: Secondary | ICD-10-CM

## 2021-06-23 LAB — BASIC METABOLIC PANEL
Anion gap: 5 (ref 5–15)
BUN: 44 mg/dL — ABNORMAL HIGH (ref 8–23)
CO2: 29 mmol/L (ref 22–32)
Calcium: 8.5 mg/dL — ABNORMAL LOW (ref 8.9–10.3)
Chloride: 106 mmol/L (ref 98–111)
Creatinine, Ser: 1.55 mg/dL — ABNORMAL HIGH (ref 0.61–1.24)
GFR, Estimated: 43 mL/min — ABNORMAL LOW (ref >60)
Glucose, Bld: 114 mg/dL — ABNORMAL HIGH (ref 70–99)
Potassium: 3.8 mmol/L (ref 3.5–5.1)
Sodium: 140 mmol/L (ref 135–145)

## 2021-06-23 LAB — BODY FLUID CULTURE W GRAM STAIN: Culture: NO GROWTH

## 2021-06-23 MED ORDER — EMPAGLIFLOZIN 10 MG PO TABS: 10.0000 mg | ORAL_TABLET | Freq: Every day | ORAL | 1 refills | Status: DC

## 2021-06-23 MED ORDER — PREDNISONE 10 MG PO TABS: ORAL_TABLET | ORAL | 0 refills | Status: AC

## 2021-06-23 MED ORDER — RIVAROXABAN 15 MG PO TABS: 15.0000 mg | ORAL_TABLET | Freq: Every day | ORAL | 3 refills | Status: DC

## 2021-06-23 NOTE — Discharge Summary (Signed)
Physician Discharge Summary   Patient: Dennis Savage MRN: [id number] DOB: [birth year]  Admit date:     06/17/2021  Discharge date: 06/23/21  Discharge Physician: Annita Brod   PCP: Baxter Hire, MD   Recommendations at discharge:   Medication change: Xarelto decreased to 15 mg daily (renally adjusted) New medication: Jardiance 10 mg p.o. daily Patient will follow-up with his cardiologist, Dr. Nehemiah Massed in 1 to 2 weeks. Follow-up with his PCP in the next 2 to 4 weeks.  At that time, renal function to be checked  Discharge Diagnoses: Principal Problem:   COPD with exacerbation ([place]) Active Problems:   CAP (community acquired pneumonia)   Pleural effusion on left   Acute kidney injury superimposed on CKD ([place])   Acute on chronic diastolic CHF (congestive heart failure) (HCC)   Atrial fibrillation (HCC)   Adrenal mass, left (HCC)   Benign essential HTN   Apnea, sleep   Obesity (BMI 30-39.9)   Interstitial lung disease (HCC)   Elevated troponin  Resolved Problems:   * No resolved hospital problems. *  Hospital Course: 88 year old man with past medical history of COPD hypertension chronic atrial fibrillation, sleep apnea presented to the hospital with shortness of breath.  Initially thought to be congestive heart failure and was diuresed and his creatinine increased.  Diuretics were stopped and renal function has been slowly improving.  Treating for COPD and pneumonia.  The patient also had a layering left pleural effusion and he is status postthoracentesis of 300 mL on 06/19/2021.    Assessment and Plan: * COPD with exacerbation ([place]) Changed over to p.o. prednisone and discharged on taper.  Continue nebulizers.  Improved.  Currently on room air.   CAP (community acquired pneumonia) Completed course of Levaquin.  So far culture of thoracentesis negative.  Pleural effusion on left Layering pleural effusion and moderate in nature.  Thoracentesis drew off 300 mL of  blood-tinged fluid on 06/19/2021.  So far cultures negative.  Cytology noted low protein and LDH of 93  Acute on chronic diastolic CHF (congestive heart failure) ([place]) Patient was initially diuresed aggressively and creatinine increased.  Likely more COPD exacerbation than CHF.  EF 60 to 65%.  To date, has diuresed 9.5 L and is -5.3 L deficient.  Will be discharged on Jardiance  Acute kidney injury superimposed on CKD ([place]) Patient's baseline creatinine around 1.07 (CKD stage II).  On admission at 1.45.  Creatinine went up to 1.95 with diuresis, so further diuresis held and received some mild IV fluids.  By the day of discharge, creatinine at 1.5.  He will follow-up with his PCP as outpatient.  Atrial fibrillation (HCC) Continue Xarelto, dose decreased from 20 mg to 15 mg for renal adjustment.  With sinus pause and bradycardia with beta-blocker started on admission.  Holding beta-blocker at this time  Adrenal mass, left [facility]) Seen on prior imaging.  PET scan in 2020 only showed mild hypermetabolism.  In 2021 on CT scan of the chest showed a left adrenal mass have gotten as large as 4.1 cm.  Outpatient follow-up.  Benign essential HTN Blood pressure starting to trend upward.  Resuming other home medications.  Obesity (BMI 30-39.9) Meets criteria with BMI greater than 30  Elevated troponin Borderline elevation likely demand ischemia         Consultants: Cardiology, interventional radiology Procedures performed: Status post left-sided thoracentesis with 300 cc of fluid removed Disposition: Home Diet recommendation:  Discharge Diet Orders (From admission, onward)  Start     Ordered   06/23/21 0000  Diet - low sodium heart healthy        06/23/21 1411           Cardiac diet DISCHARGE MEDICATION: Allergies as of 06/23/2021       Reactions   Lisinopril Cough   Penicillins Hives, Swelling   Has patient had a PCN reaction causing immediate rash, facial/tongue/throat  swelling, SOB or lightheadedness with hypotension: yES Has patient had a PCN reaction causing severe rash involving mucus membranes or skin necrosis: UNKNOWN Has patient had a PCN reaction that required hospitalization NO Has patient had a PCN reaction occurring within the last 10 years: nO If all of the above answers are "NO", then may proceed with Cephalosporin use.        Medication List     STOP taking these medications    metoprolol tartrate 25 MG tablet Commonly known as: LOPRESSOR   ondansetron 4 MG disintegrating tablet Commonly known as: Zofran ODT   traMADol 50 MG tablet Commonly known as: ULTRAM       TAKE these medications    acetaminophen 325 MG tablet Commonly known as: TYLENOL Take 650 mg by mouth every 6 (six) hours as needed.   amLODipine 10 MG tablet Commonly known as: NORVASC Take 10 mg by mouth daily.   chlorthalidone 25 MG tablet Commonly known as: HYGROTON Take 25 mg by mouth daily as needed.   docusate sodium 250 MG capsule Commonly known as: COLACE Take 250 mg by mouth daily as needed for constipation.   empagliflozin 10 MG Tabs tablet Commonly known as: JARDIANCE Take 1 tablet (10 mg total) by mouth daily. Start taking on: June 24, 2021   predniSONE 10 MG tablet Commonly known as: DELTASONE Take 2 tablets (20 mg total) by mouth daily with breakfast for 1 day, THEN 1 tablet (10 mg total) daily with breakfast for 1 day. Start taking on: June 24, 2021 What changed: See the new instructions.   PRESERVISION AREDS 2 PO Take by mouth.   Rivaroxaban 15 MG Tabs tablet Commonly known as: XARELTO Take 1 tablet (15 mg total) by mouth daily with supper. What changed:  medication strength how much to take        Follow-up Information     Andrez Grime, MD. Go on 07/13/2021.   Specialty: Cardiology Why: @ 2:45pm Contact information: [place] [address] [phone]                Discharge  Exam: Danley Danker Weights   06/21/21 0420 06/22/21 0412 06/23/21 0435  Weight: 95.2 kg 92.4 kg 91 kg   General: Alert and oriented x3 Cardiovascular: Regular rate and rhythm, S1-S2 Lungs: Decreased breath sounds throughout secondary to body habitus  Condition at discharge: good  The results of significant diagnostics from this hospitalization (including imaging, microbiology, ancillary and laboratory) are listed below for reference.   Imaging Studies: DG Chest Left Decubitus  Result Date: 06/19/2021 CLINICAL DATA:  Shortness of breath EXAM: CHEST - LEFT DECUBITUS COMPARISON:  06/18/2021 FINDINGS: Left lateral decubitus image of the chest demonstrates a moderate size layering left pleural effusion. Associated left lung opacity. No appreciable right-sided pleural effusion. No pneumothorax. IMPRESSION: Moderate size layering left pleural effusion. Electronically Signed   By: Davina Poke D.O.   On: 06/19/2021 08:19   DG Chest Port 1 View  Result Date: 06/19/2021 CLINICAL DATA:  Status post thoracentesis EXAM: PORTABLE CHEST 1 VIEW  COMPARISON:  Chest x-ray earlier the same day FINDINGS: Cardiomegaly and mediastinum appear unchanged. Calcified plaques in the aortic arch. Decreased mild opacities at the left lung base and lower lung zone. No pneumothorax visualized. IMPRESSION: 1. No pneumothorax identified. 2. Decreased likely small left pleural effusion with associated atelectasis. Electronically Signed   By: Ofilia Neas M.D.   On: 06/19/2021 14:48   DG Chest Port 1 View  Result Date: 06/18/2021 CLINICAL DATA:  88 year old male with history of congestive heart failure. EXAM: PORTABLE CHEST 1 VIEW COMPARISON:  Chest x-ray 06/17/2021. FINDINGS: Lung volumes are low. Opacity throughout the left mid to lower lung obscuring the left hemidiaphragm in the left costophrenic sulcus indicative of atelectasis and/or consolidation with superimposed small to moderate left pleural effusion. Ill-defined  opacity in the medial aspect of the right lung base also noted. No right pleural effusion. No pneumothorax. No evidence of pulmonary edema. Heart size is mildly enlarged. Upper mediastinal contours are within normal limits. Atherosclerotic calcifications in the thoracic aorta. IMPRESSION: 1. Atelectasis and/or consolidation in the left mid to lower lung and medial aspect of right lung base with small to moderate left pleural effusion. 2. Aortic atherosclerosis. 3. Mild cardiomegaly. Electronically Signed   By: Vinnie Langton M.D.   On: 06/18/2021 05:02   DG Chest Portable 1 View  Result Date: 06/17/2021 CLINICAL DATA:  SOB EXAM: PORTABLE CHEST 1 VIEW COMPARISON:  May 2021 FINDINGS: The cardiomediastinal silhouette is unchanged and enlarged in contour.Perihilar vascular congestion. Likely small LEFT pleural effusion. Heterogeneous LEFT retrocardiac opacity. Mild interstitial prominence. Visualized abdomen is unremarkable. IMPRESSION: 1. Heterogeneous LEFT retrocardiac opacity. Differential considerations include infection, aspiration or atelectasis. 2. There is a background of likely pulmonary edema with a small LEFT pleural effusion Followup PA and lateral chest X-ray is recommended in 3-4 weeks following trial of antibiotic therapy to ensure resolution and exclude underlying malignancy. Electronically Signed   By: Valentino Saxon M.D.   On: 06/17/2021 16:16   ECHOCARDIOGRAM COMPLETE  Result Date: 06/18/2021    ECHOCARDIOGRAM REPORT   Patient Name:   JAMA KRICHBAUM Date of Exam: 06/18/2021 Medical Rec #:  [id number]      Height:       69.5 in Accession #:    [id number]     Weight:       213.2 lb Date of Birth:  March 08, 1930      BSA:          2.133 m Patient Age:    57 years       BP:           148/77 mmHg Patient Gender: M              HR:           73 bpm. Exam Location:  [facility] Procedure: 2D Echo, Cardiac Doppler and Color Doppler Indications:     Congestive Heart Failure I50.9  History:         Patient has  no prior history of Echocardiogram examinations.                  Signs/Symptoms:Murmur; Risk Factors:Hypertension.  Sonographer:     Alyse Low Roar Referring Phys:  [id number] Emeterio Reeve Diagnosing Phys: Isaias Cowman MD IMPRESSIONS  1. Left ventricular ejection fraction, by estimation, is 60 to 65%. The left ventricle has normal function. The left ventricle has no regional wall motion abnormalities. There is mild left ventricular hypertrophy. Left ventricular diastolic parameters are indeterminate.  2. Right  ventricular systolic function is normal. The right ventricular size is normal.  3. The mitral valve is normal in structure. Mild mitral valve regurgitation. No evidence of mitral stenosis.  4. Tricuspid valve regurgitation is mild to moderate.  5. The aortic valve is normal in structure. Aortic valve regurgitation is mild to moderate. No aortic stenosis is present.  6. The inferior vena cava is normal in size with greater than 50% respiratory variability, suggesting right atrial pressure of 3 mmHg. FINDINGS  Left Ventricle: Left ventricular ejection fraction, by estimation, is 60 to 65%. The left ventricle has normal function. The left ventricle has no regional wall motion abnormalities. The left ventricular internal cavity size was normal in size. There is  mild left ventricular hypertrophy. Left ventricular diastolic parameters are indeterminate. Right Ventricle: The right ventricular size is normal. No increase in right ventricular wall thickness. Right ventricular systolic function is normal. Left Atrium: Left atrial size was normal in size. Right Atrium: Right atrial size was normal in size. Pericardium: There is no evidence of pericardial effusion. Mitral Valve: The mitral valve is normal in structure. Mild mitral valve regurgitation. No evidence of mitral valve stenosis. Tricuspid Valve: The tricuspid valve is normal in structure. Tricuspid valve regurgitation is mild to moderate. No evidence of  tricuspid stenosis. Aortic Valve: The aortic valve is normal in structure. Aortic valve regurgitation is mild to moderate. No aortic stenosis is present. Aortic valve peak gradient measures 6.8 mmHg. Pulmonic Valve: The pulmonic valve was normal in structure. Pulmonic valve regurgitation is not visualized. No evidence of pulmonic stenosis. Aorta: The aortic root is normal in size and structure. Venous: The inferior vena cava is normal in size with greater than 50% respiratory variability, suggesting right atrial pressure of 3 mmHg. IAS/Shunts: No atrial level shunt detected by color flow Doppler.  LEFT VENTRICLE PLAX 2D LVIDd:         5.70 cm   Diastology LVIDs:         3.95 cm   LV e' medial:    5.55 cm/s LV PW:         1.26 cm   LV E/e' medial:  20.9 LV IVS:        1.48 cm   LV e' lateral:   8.38 cm/s LVOT diam:     2.20 cm   LV E/e' lateral: 13.8 LVOT Area:     3.80 cm  RIGHT VENTRICLE RV Mid diam:    3.52 cm RV S prime:     12.10 cm/s LEFT ATRIUM              Index LA diam:        5.10 cm  2.39 cm/m LA Vol (A2C):   154.0 ml 72.19 ml/m LA Vol (A4C):   52.9 ml  24.80 ml/m LA Biplane Vol: 93.0 ml  43.59 ml/m  AORTIC VALVE                 PULMONIC VALVE AV Area (Vmax): 2.65 cm     PV Vmax:          0.99 m/s AV Vmax:        130.00 cm/s  PV Peak grad:     3.9 mmHg AV Peak Grad:   6.8 mmHg     PR End Diast Vel: 5.95 msec LVOT Vmax:      90.60 cm/s   RVOT Peak grad:   2 mmHg  AORTA Ao Root diam: 3.40 cm Ao Asc diam:  3.70 cm MITRAL VALVE                TRICUSPID VALVE MV Area (PHT): 3.91 cm     TR Peak grad:   24.8 mmHg MV Decel Time: 194 msec     TR Vmax:        249.00 cm/s MV E velocity: 116.00 cm/s                             SHUNTS                             Systemic Diam: 2.20 cm Isaias Cowman MD Electronically signed by Isaias Cowman MD Signature Date/Time: 06/18/2021/12:59:27 PM    Final    US THORACENTESIS ASP PLEURAL SPACE W/IMG GUIDE  Result Date: 06/19/2021 INDICATION: Patient with  shortness of breath and pleural effusion request received for diagnostic and therapeutic thoracentesis. EXAM: ULTRASOUND GUIDED LEFT THORACENTESIS MEDICATIONS: Local 1% lidocaine only. COMPLICATIONS: None immediate. PROCEDURE: An ultrasound guided thoracentesis was thoroughly discussed with the patient and questions answered. The benefits, risks, alternatives and complications were also discussed. The patient understands and wishes to proceed with the procedure. Written consent was obtained. Ultrasound was performed to localize and mark an adequate pocket of fluid in the left chest. The area was then prepped and draped in the normal sterile fashion. 1% Lidocaine was used for local anesthesia. Under ultrasound guidance a 19 gauge, 7-cm, Yueh catheter was introduced. Thoracentesis was performed. The catheter was removed and a dressing applied. FINDINGS: A total of approximately 300 mL of blood-tinged fluid was removed. Samples were sent to the laboratory as requested by the clinical team. IMPRESSION: Successful ultrasound guided left thoracentesis yielding 300 mL of pleural fluid. This exam was performed by Tsosie Billing PA-C, and was supervised and interpreted by Dr. Kathlene Cote. Electronically Signed   By: Aletta Edouard M.D.   On: 06/19/2021 14:38    Microbiology: Results for orders placed or performed during the hospital encounter of 06/17/21  Resp Panel by RT-PCR (Flu A&B, Covid) Nasopharyngeal Swab     Status: None   Collection Time: 06/17/21  3:57 PM   Specimen: Nasopharyngeal Swab; Nasopharyngeal(NP) swabs in vial transport medium  Result Value Ref Range Status   SARS Coronavirus 2 by RT PCR NEGATIVE NEGATIVE Final    Comment: (NOTE) SARS-CoV-2 target nucleic acids are NOT DETECTED.  The SARS-CoV-2 RNA is generally detectable in upper respiratory specimens during the acute phase of infection. The lowest concentration of SARS-CoV-2 viral copies this assay can detect is 138 copies/mL. A negative  result does not preclude SARS-Cov-2 infection and should not be used as the sole basis for treatment or other patient management decisions. A negative result may occur with  improper specimen collection/handling, submission of specimen other than nasopharyngeal swab, presence of viral mutation(s) within the areas targeted by this assay, and inadequate number of viral copies(<138 copies/mL). A negative result must be combined with clinical observations, patient history, and epidemiological information. The expected result is Negative.  Fact Sheet for Patients:  EntrepreneurPulse.com.au  Fact Sheet for Healthcare Providers:  IncredibleEmployment.be  This test is no t yet approved or cleared by the [place] FDA and  has been authorized for detection and/or diagnosis of SARS-CoV-2 by FDA under an Emergency Use Authorization (EUA). This EUA will remain  in effect (meaning this test can be used) for the duration of  the COVID-19 declaration under Section 564(b)(1) of the Act, 21 U.S.C.section 360bbb-3(b)(1), unless the authorization is terminated  or revoked sooner.       Influenza A by PCR NEGATIVE NEGATIVE Final   Influenza B by PCR NEGATIVE NEGATIVE Final    Comment: (NOTE) The Xpert Xpress SARS-CoV-2/FLU/RSV plus assay is intended as an aid in the diagnosis of influenza from Nasopharyngeal swab specimens and should not be used as a sole basis for treatment. Nasal washings and aspirates are unacceptable for Xpert Xpress SARS-CoV-2/FLU/RSV testing.  Fact Sheet for Patients: EntrepreneurPulse.com.au  Fact Sheet for Healthcare Providers: IncredibleEmployment.be  This test is not yet approved or cleared by the [place] FDA and has been authorized for detection and/or diagnosis of SARS-CoV-2 by FDA under an Emergency Use Authorization (EUA). This EUA will remain in effect (meaning this test can be used)  for the duration of the COVID-19 declaration under Section 564(b)(1) of the Act, 21 U.S.C. section 360bbb-3(b)(1), unless the authorization is terminated or revoked.  Performed at [facility], [address] [place], [place] [address]   Blood Culture (routine x 2)     Status: None   Collection Time: 06/17/21  5:42 PM   Specimen: BLOOD  Result Value Ref Range Status   Specimen Description   Final    BLOOD Blood Culture results may not be optimal due to an excessive volume of blood received in culture bottles   Special Requests   Final    BOTTLES DRAWN AEROBIC AND ANAEROBIC BLOOD LEFT HAND   Culture   Final    NO GROWTH 5 DAYS Performed at [facility], [address] [place], [place] [address]    Report Status 06/22/2021 FINAL  Final  Urine Culture     Status: Abnormal   Collection Time: 06/17/21  5:42 PM   Specimen: In/Out Cath Urine  Result Value Ref Range Status   Specimen Description   Final    IN/OUT CATH URINE Performed at [facility], [address], [place], [place] [address]    Special Requests   Final    NONE Performed at [facility], [address] [place], [place] [address]    Culture MULTIPLE SPECIES PRESENT, SUGGEST RECOLLECTION (A)  Final   Report Status 06/19/2021 FINAL  Final  Blood Culture (routine x 2)     Status: None   Collection Time: 06/17/21 10:42 PM   Specimen: BLOOD  Result Value Ref Range Status   Specimen Description BLOOD LEFT ANTECUBITAL  Final   Special Requests   Final    BOTTLES DRAWN AEROBIC AND ANAEROBIC Blood Culture adequate volume   Culture   Final    NO GROWTH 5 DAYS Performed at [facility], [address] [place], [place] [address]    Report Status 06/22/2021 FINAL  Final  Body fluid culture w Gram Stain     Status: None   Collection Time: 06/19/21  2:44 PM   Specimen: PATH Cytology Pleural fluid  Result Value Ref Range Status   Specimen Description   Final     PLEURAL Performed at [facility], [address] [place], [place] [address]    Special Requests   Final    NONE Performed at [facility], [address] [place], [place] [address]    Gram Stain   Final    RARE WBC PRESENT, PREDOMINANTLY MONONUCLEAR NO ORGANISMS SEEN    Culture   Final    NO GROWTH Performed  at [facility], [place] [address] [place], [place] [address]    Report Status 06/23/2021 FINAL  Final    Labs: CBC: Recent Labs  Lab 06/17/21 1557 06/20/21 0630 06/21/21 0625  WBC 13.6* 10.2 8.9  NEUTROABS 9.8*  --   --   HGB 15.0 13.7 13.4  HCT 46.6 41.4 41.3  MCV 90.1 88.5 89.0  PLT 194 235 093   Basic Metabolic Panel: Recent Labs  Lab 06/19/21 0442 06/20/21 0630 06/21/21 0625 06/22/21 0512 06/23/21 0548  NA 139 140 140 140 140  K 3.5 3.1* 3.7 3.7 3.8  CL 100 100 104 105 106  CO2 '29 27 27 27 29  '$ GLUCOSE 138* 120* 98 96 114*  BUN 47* 56* 45* 41* 44*  CREATININE 1.95* 1.81* 1.65* 1.50* 1.55*  CALCIUM 8.6* 8.8* 8.6* 8.6* 8.5*   Liver Function Tests: Recent Labs  Lab 06/17/21 1557  AST 29  ALT 19  ALKPHOS 63  BILITOT 1.8*  PROT 7.2  ALBUMIN 3.3*   CBG: No results for input(s): GLUCAP in the last 168 hours.  Discharge time spent: less than 30 minutes.  Signed: Annita Brod, MD [facility] 06/23/2021

## 2021-06-23 NOTE — Progress Notes (Signed)
Mobility Specialist - Progress Note ? ? 06/23/21 1200  ?Mobility  ?Activity Ambulated with assistance in hallway;Stood at bedside;Dangled on edge of bed  ?Level of Assistance Standby assist, set-up cues, supervision of patient - no hands on  ?Assistive Device Front wheel walker  ?Distance Ambulated (ft) 165 ft  ?Activity Response Tolerated well  ?$Mobility charge 1 Mobility  ? ? ? ?Pre-mobility: 60 HR, BP, 96% SpO2 ?Post-mobility: 38-44 HR, BP,96% SPO2 ? ?Pt supine upon arrival using RA. Pt completes bed mobility + STS indep --- impulsive to stand. Ambulates 161f with supervision and returns to chair voicing mild SOB. HR drops to 36-44 bpm post mobility. RN notified. Pt left in chair with needs in reach. ? ?MMerrily Brittle?Mobility Specialist ?06/23/21, 12:43 PM ? ? ?

## 2021-06-23 NOTE — Progress Notes (Signed)
PIV removed. Discharge instructions completed. Patient verbalized understanding of medication regimen, follow up appointments and discharge instructions. Patient belongings gathered and packed to discharge. Daughter will pick up patient. ? ? ?

## 2021-06-28 NOTE — Progress Notes (Signed)
? Patient ID: Dennis Savage, male    DOB: June 18, 1929, 88 y.o.   MRN: [id number] ? ?HPI ? ?Mr Mikhail is a 88 y/o male with a history of HTN, obstructive sleep apnea, arthritis, atrial fibrillation, COPD, GERD, ILD, PVD, retinal hemorrhage, lymphangioendothelioma, previous tobacco use and chronic heart failure.  ? ?Echo report from 06/18/21 reviewed and showed an EF of 60-65% along with mild LVH, mild MR and mild/moderate TR.  ? ?Admitted 06/17/21 due to shortness of breath due to HF, COPD and pneumonia. Initially given IV lasix and then transitioned to oral diuretics. Cardiology consult obtained. Had left pleural effusion and he is status postthoracentesis of 300 mL. PT evaluation done. Given prednisone and nebulizers. Beta-blocker held due to bradycardia. Discharged after 6 days.  ? ?He presents today for his initial visit with a chief complaint of minimal fatigue upon moderate exertion., He describes this as chronic in nature although much better since his recent admission. He has associated cough along with this. He denies any dizziness, difficulty sleeping, abdominal distention, palpitations, pedal edema, chest pain, shortness of breath or weight gain.  ? ?Past Medical History:  ?Diagnosis Date  ? Acute diarrhea 02/25/2015  ? After cataract not obscuring vision 12/21/2011  ? 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  ? Apnea, sleep 07/13/2015  ? Arthralgia of multiple joints 06/15/2011  ? Arthritis   ? Artificial lens present 12/08/2010  ? Atrial fibrillation ([place])   ? Benign essential HTN 11/03/2010  ? CHF (congestive heart failure) ([place])   ? Chronic obstructive pulmonary disease ([place]) 11/03/2010  ? CN (constipation) 06/15/2011  ? 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 Dizziness 02/25/2015  ? GERD (gastroesophageal reflux disease)   ? Heart murmur   ? Hypertension   ? Interstitial lung disease ([place]) 10/13/2013  ? LBP (low back pain) 11/03/2010  ? 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 Peripheral vascular disease ([place]) 11/03/2010  ?  Retinal hemorrhage 03/16/2014  ? ?Past Surgical History:  ?Procedure Laterality Date  ? APPENDECTOMY    ? BACK SURGERY    ? CARPAL TUNNEL RELEASE Bilateral   ? EYE SURGERY Bilateral   ? Cataract Extraction with IOL  ? HERNIA REPAIR    ? Umbilical Hernia X 3  ? JOINT REPLACEMENT    ? bilat knees  ? NASAL SINUS SURGERY    ? REPLACEMENT TOTAL KNEE Bilateral   ? SHOULDER SURGERY Right   ? TONSILLECTOMY    ? TOTAL HIP ARTHROPLASTY Right 10/12/2015  ? Procedure: TOTAL HIP ARTHROPLASTY;  Surgeon: Dereck Leep, MD;  Location: [facility] ORS;  Service: Orthopedics;  Laterality: Right;  ? ?Family History  ?Problem Relation Age of Onset  ? Bladder Cancer Neg Hx   ? Kidney cancer Neg Hx   ? Prostate cancer Neg Hx   ? ?Social History  ? ?Tobacco Use  ? Smoking status: Former  ?  Packs/day: 1.00  ?  Types: Cigarettes  ? Smokeless tobacco: Never  ?Substance Use Topics  ? Alcohol use: No  ? ?Allergies  ?Allergen Reactions  ? Lisinopril Cough  ? Penicillins Hives and Swelling  ?  Has patient had a PCN reaction causing immediate rash, facial/tongue/throat swelling, SOB or lightheadedness with hypotension: yES ?Has patient had a PCN reaction causing severe rash involving mucus membranes or skin necrosis: UNKNOWN ?Has patient had a PCN reaction that required hospitalization NO ?Has patient had a PCN reaction occurring within the last 10 years: nO ?If all of the above answers are "NO", then may proceed with Cephalosporin  use.  ? ?Prior to Admission medications   ?Medication Sig Start Date End Date Taking? Authorizing Provider  ?acetaminophen (TYLENOL) 325 MG tablet Take 650 mg by mouth every 6 (six) hours as needed.   Yes [provider]  ?amLODipine (NORVASC) 10 MG tablet Take 10 mg by mouth daily.   Yes [provider]  ?chlorthalidone (HYGROTON) 25 MG tablet Take 25 mg by mouth every other day. Patient taking every other day   Yes [provider]  ?docusate sodium (COLACE) 250 MG capsule Take 250 mg by mouth  daily as needed for constipation.   Yes [provider]  ?empagliflozin (JARDIANCE) 10 MG TABS tablet Take 1 tablet (10 mg total) by mouth daily. 06/24/21  Yes Annita Brod, MD  ?Multiple Vitamins-Minerals (PRESERVISION AREDS 2 PO) Take by mouth.   Yes [provider]  ?Rivaroxaban (XARELTO) 15 MG TABS tablet Take 1 tablet (15 mg total) by mouth daily with supper. 06/23/21  Yes Annita Brod, MD  ? ? ?Review of Systems  ?Constitutional:  Positive for fatigue. Negative for appetite change.  ?HENT:  Positive for hearing loss. Negative for rhinorrhea and sore throat.   ?Eyes: Negative.   ?Respiratory:  Positive for cough (very little). Negative for chest tightness and shortness of breath.   ?Cardiovascular:  Negative for chest pain, palpitations and leg swelling.  ?Gastrointestinal:  Negative for abdominal distention and abdominal pain.  ?Endocrine: Negative.   ?Genitourinary: Negative.   ?Musculoskeletal:  Negative for back pain and neck pain.  ?Skin: Negative.   ?Allergic/Immunologic: Negative.   ?Neurological:  Negative for dizziness and light-headedness.  ?Hematological:  Negative for adenopathy. Does not bruise/bleed easily.  ?Psychiatric/Behavioral:  Negative for dysphoric mood and sleep disturbance (sleeps with HOB elevated). The patient is not nervous/anxious.   ? ?Vitals:  ? 06/30/21 1332  ?BP: 140/65  ?Pulse: 61  ?Resp: 16  ?SpO2: 98%  ?Weight: 209 lb 2 oz (94.9 kg)  ?Height: '5\' 9"'$  (1.753 m)  ? ?Wt Readings from Last 3 Encounters:  ?06/30/21 209 lb 2 oz (94.9 kg)  ?06/23/21 200 lb 9.9 oz (91 kg)  ?09/04/20 212 lb (96.2 kg)  ? ?Lab Results  ?Component Value Date  ? CREATININE 1.55 (H) 06/23/2021  ? CREATININE 1.50 (H) 06/22/2021  ? CREATININE 1.65 (H) 06/21/2021  ? ?Physical Exam ?Vitals and nursing note reviewed. Exam conducted with a chaperone present (daughter).  ?Constitutional:   ?   Appearance: Normal appearance.  ?HENT:  ?   Head: Normocephalic and atraumatic.  ?   Right Ear:  Decreased hearing noted.  ?   Left Ear: Decreased hearing noted.  ?Cardiovascular:  ?   Rate and Rhythm: Normal rate and regular rhythm.  ?Pulmonary:  ?   Effort: Pulmonary effort is normal. No respiratory distress.  ?   Breath sounds: No wheezing or rales.  ?Abdominal:  ?   General: There is no distension.  ?   Palpations: Abdomen is soft.  ?Musculoskeletal:     ?   General: No tenderness.  ?   Cervical back: Normal range of motion and neck supple.  ?   Right lower leg: No edema.  ?   Left lower leg: No edema.  ?Skin: ?   General: Skin is warm and dry.  ?Neurological:  ?   General: No focal deficit present.  ?   Mental Status: He is alert and oriented to person, place, and time.  ?Psychiatric:     ?   Mood  and Affect: Mood normal.     ?   Behavior: Behavior normal.     ?   Thought Content: Thought content normal.  ?  ?Assessment & Plan: ? ?1: Chronic heart failure with preserved ejection fraction with structural changes (LVH)- ?- NYHA class II ?- euvolemic today ?- weighing daily and knows to call for an overnight weight gain of > 2 pounds or a weekly weight gain of > 5 pounds ?- not adding salt and daughter is looking at food labels; low sodium cookbook provided ?- on GDMT of jardiance ?- has a cough with lisinopril ?- discussed adding entresto but they are hesitant to add any additional medications; discussed the potential benefits of entresto and that we would try to stop the amlodipine if we started entresto; entresto information provided and will discuss further at his next visit ?- BNP 06/17/21 was 453.6 ? ?2: HTN- ?- BP mildly elevated (140/65) ?- saw PCP Edwina Barth) 05/03/21 ?- BMP 06/23/21 reviewed and showed sodium 140, potassium 3.8, creatinine 1.55 & GFR 43 ? ?3: Atrial fibrillation- ?- to see cardiology Vedia Coffer) 07/13/21 ?- on xarelto ? ?4: COPD- ?- saw pulmonology Raul Del) 03/07/20 ? ? ?Medication bottles reviewed.  ? ?Return in 2 months, sooner if needed ? ? ?

## 2021-06-30 ENCOUNTER — Ambulatory Visit: Payer: Medicare Other | Attending: Family | Admitting: Family

## 2021-06-30 ENCOUNTER — Other Ambulatory Visit: Payer: Self-pay

## 2021-06-30 ENCOUNTER — Encounter: Payer: Self-pay | Admitting: Family

## 2021-06-30 VITALS — BP 140/65 | HR 61 | Resp 16 | Ht 69.0 in | Wt 209.1 lb

## 2021-06-30 DIAGNOSIS — Z7984 Long term (current) use of oral hypoglycemic drugs: Secondary | ICD-10-CM | POA: Diagnosis not present

## 2021-06-30 DIAGNOSIS — J90 Pleural effusion, not elsewhere classified: Secondary | ICD-10-CM | POA: Insufficient documentation

## 2021-06-30 DIAGNOSIS — H9190 Unspecified hearing loss, unspecified ear: Secondary | ICD-10-CM | POA: Diagnosis not present

## 2021-06-30 DIAGNOSIS — I5032 Chronic diastolic (congestive) heart failure: Secondary | ICD-10-CM | POA: Diagnosis not present

## 2021-06-30 DIAGNOSIS — Z8701 Personal history of pneumonia (recurrent): Secondary | ICD-10-CM | POA: Insufficient documentation

## 2021-06-30 DIAGNOSIS — I110 Hypertensive heart disease with heart failure: Secondary | ICD-10-CM | POA: Diagnosis present

## 2021-06-30 DIAGNOSIS — J449 Chronic obstructive pulmonary disease, unspecified: Secondary | ICD-10-CM | POA: Insufficient documentation

## 2021-06-30 DIAGNOSIS — K219 Gastro-esophageal reflux disease without esophagitis: Secondary | ICD-10-CM | POA: Insufficient documentation

## 2021-06-30 DIAGNOSIS — I739 Peripheral vascular disease, unspecified: Secondary | ICD-10-CM | POA: Insufficient documentation

## 2021-06-30 DIAGNOSIS — J849 Interstitial pulmonary disease, unspecified: Secondary | ICD-10-CM

## 2021-06-30 DIAGNOSIS — I4891 Unspecified atrial fibrillation: Secondary | ICD-10-CM | POA: Insufficient documentation

## 2021-06-30 DIAGNOSIS — D181 Lymphangioma, any site: Secondary | ICD-10-CM | POA: Insufficient documentation

## 2021-06-30 DIAGNOSIS — G4733 Obstructive sleep apnea (adult) (pediatric): Secondary | ICD-10-CM | POA: Insufficient documentation

## 2021-06-30 DIAGNOSIS — H3560 Retinal hemorrhage, unspecified eye: Secondary | ICD-10-CM | POA: Insufficient documentation

## 2021-06-30 DIAGNOSIS — M1990 Unspecified osteoarthritis, unspecified site: Secondary | ICD-10-CM | POA: Diagnosis not present

## 2021-06-30 DIAGNOSIS — Z7901 Long term (current) use of anticoagulants: Secondary | ICD-10-CM | POA: Insufficient documentation

## 2021-06-30 DIAGNOSIS — Z87891 Personal history of nicotine dependence: Secondary | ICD-10-CM | POA: Insufficient documentation

## 2021-06-30 DIAGNOSIS — Z79899 Other long term (current) drug therapy: Secondary | ICD-10-CM | POA: Diagnosis not present

## 2021-06-30 DIAGNOSIS — I10 Essential (primary) hypertension: Secondary | ICD-10-CM | POA: Diagnosis not present

## 2021-06-30 DIAGNOSIS — I480 Paroxysmal atrial fibrillation: Secondary | ICD-10-CM | POA: Diagnosis not present

## 2021-06-30 NOTE — Patient Instructions (Signed)
Continue weighing daily and call for an overnight weight gain of 3 pounds or more or a weekly weight gain of more than 5 pounds.   If you have voicemail, please make sure your mailbox is cleaned out so that we may leave a message and please make sure to listen to any voicemails.     

## 2021-07-04 LAB — MISC LABCORP TEST (SEND OUT): Labcorp test code: 5367

## 2021-08-31 NOTE — Progress Notes (Signed)
Patient ID: Dennis Savage, male    DOB: 03-Aug-1929, 88 y.o.   MRN: [id number]  HPI  Dennis Savage is a 88 y/o male with a history of HTN, obstructive sleep apnea, arthritis, atrial fibrillation, COPD, GERD, ILD, PVD, retinal hemorrhage, lymphangioendothelioma, previous tobacco use and chronic heart failure.   Echo report from 06/18/21 reviewed and showed an EF of 60-65% along with mild LVH, mild Dennis and mild/moderate TR.   Admitted 06/17/21 due to shortness of breath due to HF, COPD and pneumonia. Initially given IV lasix and then transitioned to oral diuretics. Cardiology consult obtained. Had left pleural effusion and he is status postthoracentesis of 300 mL. PT evaluation done. Given prednisone and nebulizers. Beta-blocker held due to bradycardia. Discharged after 6 days.   He presents today for a follow-up visit with a chief complaint of minimal fatigue upon moderate exertion. Describes this as chronic in nature. He has associated cough and intermittent dizziness along with this. He denies any difficulty sleeping, abdominal distention, palpitations, pedal edema, chest pain, shortness of breath or weight gain.   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   Dizziness 02/25/2015   GERD (gastroesophageal reflux disease)    Heart murmur    Hypertension    Interstitial lung disease ([place]) 10/13/2013   LBP (low back pain) 11/03/2010   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   Peripheral vascular disease ([place]) 11/03/2010   Retinal hemorrhage 03/16/2014    Past Surgical History:  Procedure Laterality Date   APPENDECTOMY     BACK SURGERY     CARPAL TUNNEL RELEASE Bilateral    EYE SURGERY Bilateral    Cataract Extraction with IOL   HERNIA REPAIR     Umbilical Hernia X 3   JOINT REPLACEMENT     bilat knees   NASAL SINUS SURGERY     REPLACEMENT TOTAL KNEE Bilateral    SHOULDER SURGERY Right    TONSILLECTOMY     TOTAL HIP ARTHROPLASTY Right 10/12/2015   Procedure: TOTAL HIP ARTHROPLASTY;  Surgeon: Dereck Leep, MD;  Location: [facility] ORS;  Service: Orthopedics;  Laterality: Right;   Family History  Problem Relation Age of Onset   Bladder Cancer Neg Hx    Kidney cancer Neg Hx    Prostate cancer Neg Hx    Social History   Tobacco Use   Smoking status: Former    Packs/day: 1.00    Types: Cigarettes   Smokeless tobacco: Never  Substance Use Topics   Alcohol use: No   Allergies  Allergen Reactions   Lisinopril Cough   Penicillins Hives and Swelling    Has patient had a PCN reaction causing immediate rash, facial/tongue/throat swelling, SOB or lightheadedness with hypotension: yES Has patient had a PCN reaction causing severe rash involving mucus membranes or skin necrosis: UNKNOWN Has patient had a PCN reaction that required hospitalization NO Has patient had a PCN reaction occurring within the last 10 years: nO If all of the above answers are "NO", then may proceed with Cephalosporin use.   Prior to Admission  medications   Medication Sig Start Date End Date Taking? Authorizing Provider  acetaminophen (TYLENOL) 325 MG tablet Take 650 mg by mouth every 6 (six) hours as needed.   Yes [provider]  amLODipine (NORVASC) 10 MG tablet Take 10 mg by mouth daily.   Yes [provider]  chlorthalidone (HYGROTON) 25 MG tablet Take 25 mg by mouth every other day. Patient taking every other day   Yes [provider]  docusate sodium (COLACE) 250 MG capsule Take 250 mg by mouth daily as needed for constipation.    Yes [provider]  empagliflozin (JARDIANCE) 10 MG TABS tablet Take 1 tablet (10 mg total) by mouth daily. 06/24/21  Yes Annita Brod, MD  Multiple Vitamins-Minerals (PRESERVISION AREDS 2 PO) Take by mouth.   Yes [provider]  Rivaroxaban (XARELTO) 15 MG TABS tablet Take 1 tablet (15 mg total) by mouth daily with supper. 06/23/21  Yes Annita Brod, MD   Review of Systems  Constitutional:  Positive for fatigue. Negative for appetite change.  HENT:  Positive for hearing loss. Negative for rhinorrhea and sore throat.   Eyes: Negative.   Respiratory:  Positive for cough (very little). Negative for chest tightness and shortness of breath.   Cardiovascular:  Negative for chest pain, palpitations and leg swelling.  Gastrointestinal:  Negative for abdominal distention and abdominal pain.  Endocrine: Negative.   Genitourinary: Negative.   Musculoskeletal:  Negative for back pain and neck pain.  Skin: Negative.   Allergic/Immunologic: Negative.   Neurological:  Positive for dizziness (this morning). Negative for light-headedness.  Hematological:  Negative for adenopathy. Does not bruise/bleed easily.  Psychiatric/Behavioral:  Negative for dysphoric mood and sleep disturbance (sleeps with HOB elevated). The patient is not nervous/anxious.    Vitals:   09/01/21 0946  BP: 133/68  Pulse: (!) 47  Resp: 18  SpO2: 99%  Weight: 207 lb (93.9 kg)  Height: '5\' 9"'$  (1.753 m)   Wt Readings from Last 3 Encounters:  09/01/21 207 lb (93.9 kg)  06/30/21 209 lb 2 oz (94.9 kg)  06/23/21 200 lb 9.9 oz (91 kg)   Lab Results  Component Value Date   CREATININE 1.55 (H) 06/23/2021   CREATININE 1.50 (H) 06/22/2021   CREATININE 1.65 (H) 06/21/2021   Physical Exam Vitals and nursing note reviewed. Exam conducted with a chaperone present (daughter).  Constitutional:      Appearance: Normal appearance.  HENT:     Head: Normocephalic and atraumatic.     Right Ear: Decreased  hearing noted.     Left Ear: Decreased hearing noted.  Cardiovascular:     Rate and Rhythm: Regular rhythm. Bradycardia present.  Pulmonary:     Effort: Pulmonary effort is normal. No respiratory distress.     Breath sounds: No wheezing or rales.  Abdominal:     General: There is no distension.     Palpations: Abdomen is soft.  Musculoskeletal:        General: No tenderness.     Cervical back: Normal range of motion and neck supple.     Right lower leg: No edema.     Left lower leg: No edema.  Skin:    General: Skin is warm and dry.  Neurological:     General: No focal deficit present.     Mental Status: He is alert and oriented to person, place, and time.  Psychiatric:        Mood and Affect: Mood normal.  Behavior: Behavior normal.        Thought Content: Thought content normal.    Assessment & Plan:  1: Chronic heart failure with preserved ejection fraction with structural changes (LVH)- - NYHA class II - euvolemic today - weighing daily and knows to call for an overnight weight gain of > 2 pounds or a weekly weight gain of > 5 pounds - weight down 2 pounds from last visit here 2 months ago - not adding salt and daughter is looking at food labels - on GDMT of jardiance - had a cough with lisinopril - BNP 06/17/21 was 453.6  2: HTN- - BP looks good (133/68) - saw PCP Edwina Barth) 08/30/21 - BMP 06/23/21 reviewed and showed sodium 140, potassium 3.8, creatinine 1.55 & GFR 43  3: Atrial fibrillation- - saw cardiology Nehemiah Massed) 08/02/21 - on xarelto - bradycardic so no need for beta-blocker  4: COPD- - saw pulmonology Raul Del) 03/07/20   Medication list reviewed.   Due to HF stability, will not make a return appointment for patient at this time. Advised patient to follow closely with cardiology and PCP but that he could call back at anytime for any questions or to make another appointment. They were comfortable with this plan.

## 2021-09-01 ENCOUNTER — Encounter: Payer: Self-pay | Admitting: Family

## 2021-09-01 ENCOUNTER — Ambulatory Visit: Payer: Medicare Other | Attending: Family | Admitting: Family

## 2021-09-01 VITALS — BP 133/68 | HR 47 | Resp 18 | Ht 69.0 in | Wt 207.0 lb

## 2021-09-01 DIAGNOSIS — J849 Interstitial pulmonary disease, unspecified: Secondary | ICD-10-CM

## 2021-09-01 DIAGNOSIS — I480 Paroxysmal atrial fibrillation: Secondary | ICD-10-CM

## 2021-09-01 DIAGNOSIS — Z79899 Other long term (current) drug therapy: Secondary | ICD-10-CM | POA: Insufficient documentation

## 2021-09-01 DIAGNOSIS — I5032 Chronic diastolic (congestive) heart failure: Secondary | ICD-10-CM | POA: Diagnosis present

## 2021-09-01 DIAGNOSIS — I10 Essential (primary) hypertension: Secondary | ICD-10-CM | POA: Diagnosis not present

## 2021-09-01 DIAGNOSIS — G4733 Obstructive sleep apnea (adult) (pediatric): Secondary | ICD-10-CM | POA: Insufficient documentation

## 2021-09-01 DIAGNOSIS — I739 Peripheral vascular disease, unspecified: Secondary | ICD-10-CM | POA: Insufficient documentation

## 2021-09-01 DIAGNOSIS — Z87891 Personal history of nicotine dependence: Secondary | ICD-10-CM | POA: Diagnosis not present

## 2021-09-01 DIAGNOSIS — I110 Hypertensive heart disease with heart failure: Secondary | ICD-10-CM | POA: Diagnosis not present

## 2021-09-01 DIAGNOSIS — J449 Chronic obstructive pulmonary disease, unspecified: Secondary | ICD-10-CM | POA: Diagnosis not present

## 2021-09-01 DIAGNOSIS — Z7901 Long term (current) use of anticoagulants: Secondary | ICD-10-CM | POA: Insufficient documentation

## 2021-09-01 DIAGNOSIS — I4891 Unspecified atrial fibrillation: Secondary | ICD-10-CM | POA: Diagnosis not present

## 2021-09-01 NOTE — Patient Instructions (Addendum)
Continue weighing daily and call for an overnight weight gain of 3 pounds or more or a weekly weight gain of more than 5 pounds. ? ? ?If you have voicemail, please make sure your mailbox is cleaned out so that we may leave a message and please make sure to listen to any voicemails.  ? ? ?Call us in the future if you need us for anything ?

## 2022-01-17 ENCOUNTER — Ambulatory Visit (INDEPENDENT_AMBULATORY_CARE_PROVIDER_SITE_OTHER): Payer: Medicare Other | Admitting: Urology

## 2022-01-17 ENCOUNTER — Encounter: Payer: Self-pay | Admitting: Urology

## 2022-01-17 VITALS — BP 155/67 | HR 51 | Ht 69.0 in | Wt 203.0 lb

## 2022-01-17 DIAGNOSIS — R350 Frequency of micturition: Secondary | ICD-10-CM | POA: Diagnosis not present

## 2022-01-17 DIAGNOSIS — D3500 Benign neoplasm of unspecified adrenal gland: Secondary | ICD-10-CM

## 2022-01-17 DIAGNOSIS — N401 Enlarged prostate with lower urinary tract symptoms: Secondary | ICD-10-CM

## 2022-01-17 DIAGNOSIS — R3129 Other microscopic hematuria: Secondary | ICD-10-CM | POA: Diagnosis not present

## 2022-01-17 LAB — BLADDER SCAN AMB NON-IMAGING

## 2022-01-17 NOTE — Progress Notes (Signed)
01/17/2022 1:09 PM   Dennis Savage [birth year] [id number]  Referring provider: Baxter Hire, MD [place],  [place] [address]  Chief Complaint  Patient presents with   Benign Prostatic Hypertrophy    HPI: 88 year old male who presents today to reestablish care for multiple GU issues.  He was last seen in 2018 for multiple GU issues including BPH, LUTS and nocturia, remote history of a bladder lesion status post resection of this in the remote past but no documented history of bladder cancer per se, as well as a personal history of kidney stones.  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  Most recently, he was started back on Flomax by his PCP about a month ago.  He has not seen any difference in his urination since starting this medication.  He reports today that he often feels like he does not empty his bladder.  He will go to the bathroom and urinate a little bit stop and then have to start again.  This goes on for 7 or 8 minutes voiding multiple times in order to complete.  He then reports that if he washes his hand with cold water the same process starts again.  He gets up about 3 times at night to urinate.  He feels like his stream is weaker.  He also feels like he cannot hold it if he needs to get there in time.  No gross hematuria or dysuria.  Notably, we were also following him for adrenal myolipoma's which were enlarging.  In the interim since our last visit in 2018, he did have a PET scan in 8/20 20 which showed mild hypermetabolism of the left adrenal mass which had slowly enlarged over the past several years.  He has not had any follow-up cross-sectional imaging since 2020.  At that time, measured 3.7 cm.  3-10 RBC / HPF  Results for orders placed or performed in visit on 01/17/22  Bladder Scan (Post Void Residual) in office  Result Value Ref Range   Scan Result 23ML     IPSS     Row Name 01/17/22  1100         International Prostate Symptom Score   How often have you had the sensation of not emptying your bladder? About half the time     How often have you had to urinate less than every two hours? About half the time     How often have you found you stopped and started again several times when you urinated? Almost always     How often have you found it difficult to postpone urination? About half the time     How often have you had a weak urinary stream? Almost always     How often have you had to strain to start urination? Not at All     How many times did you typically get up at night to urinate? 3 Times     Total IPSS Score 22       Quality of Life due to urinary symptoms   If you were to spend the rest of your life with your urinary condition just the way it is now how would you feel about that? Mixed              Score:  1-7 Mild 8-19 Moderate 20-35 Severe    PMH: Past Medical History:  Diagnosis Date   Acute diarrhea  02/25/2015   After cataract not obscuring vision 12/21/2011   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   Apnea, sleep 07/13/2015   Arthralgia of multiple joints 06/15/2011   Arthritis    Artificial lens present 12/08/2010   Atrial fibrillation (HCC)    Benign essential HTN 11/03/2010   CHF (congestive heart failure) (HCC)    Chronic obstructive pulmonary disease ([place]) 11/03/2010   CN (constipation) 06/15/2011   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   Dizziness 02/25/2015   GERD (gastroesophageal reflux disease)    Heart murmur    Hypertension    Interstitial lung disease ([place]) 10/13/2013   LBP (low back pain) 11/03/2010   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   Peripheral vascular disease ([place]) 11/03/2010   Retinal hemorrhage 03/16/2014    Surgical History: Past Surgical History:  Procedure Laterality Date   APPENDECTOMY     BACK SURGERY     CARPAL TUNNEL RELEASE Bilateral    EYE SURGERY Bilateral    Cataract Extraction with IOL   HERNIA REPAIR      Umbilical Hernia X 3   JOINT REPLACEMENT     bilat knees   NASAL SINUS SURGERY     REPLACEMENT TOTAL KNEE Bilateral    SHOULDER SURGERY Right    TONSILLECTOMY     TOTAL HIP ARTHROPLASTY Right 10/12/2015   Procedure: TOTAL HIP ARTHROPLASTY;  Surgeon: Dereck Leep, MD;  Location: [facility] ORS;  Service: Orthopedics;  Laterality: Right;    Home Medications:  Allergies as of 01/17/2022       Reactions   Lisinopril Cough   Penicillins Hives, Swelling   Has patient had a PCN reaction causing immediate rash, facial/tongue/throat swelling, SOB or lightheadedness with hypotension: yES Has patient had a PCN reaction causing severe rash involving mucus membranes or skin necrosis: UNKNOWN Has patient had a PCN reaction that required hospitalization NO Has patient had a PCN reaction occurring within the last 10 years: nO If all of the above answers are "NO", then may proceed with Cephalosporin use.        Medication List        Accurate as of January 17, 2022  1:09 PM. If you have any questions, ask your nurse or doctor.          acetaminophen 325 MG tablet Commonly known as: TYLENOL Take 650 mg by mouth every 6 (six) hours as needed.   amLODipine 10 MG tablet Commonly known as: NORVASC Take 10 mg by mouth daily.   chlorthalidone 25 MG tablet Commonly known as: HYGROTON Take 25 mg by mouth every other day. Patient taking every other day   docusate sodium 250 MG capsule Commonly known as: COLACE Take 250 mg by mouth daily as needed for constipation.   empagliflozin 10 MG Tabs tablet Commonly known as: JARDIANCE Take 1 tablet (10 mg total) by mouth daily.   PRESERVISION AREDS 2 PO Take by mouth.   Rivaroxaban 15 MG Tabs tablet Commonly known as: XARELTO Take 1 tablet (15 mg total) by mouth daily with supper.   tamsulosin 0.4 MG Caps capsule Commonly known as: FLOMAX Take 0.4 mg by mouth daily.        Allergies:  Allergies  Allergen Reactions   Lisinopril  Cough   Penicillins Hives and Swelling    Has patient had a PCN reaction causing immediate rash, facial/tongue/throat swelling, SOB or lightheadedness with hypotension: yES Has patient had a PCN reaction causing severe rash involving mucus membranes or skin necrosis: UNKNOWN Has patient had a PCN reaction  that required hospitalization NO Has patient had a PCN reaction occurring within the last 10 years: nO If all of the above answers are "NO", then may proceed with Cephalosporin use.    Family History: Family History  Problem Relation Age of Onset   Bladder Cancer Neg Hx    Kidney cancer Neg Hx    Prostate cancer Neg Hx     Social History:  reports that he has quit smoking. His smoking use included cigarettes. He smoked an average of 1 pack per day. He has never used smokeless tobacco. He reports that he does not drink alcohol and does not use drugs.   Physical Exam: BP (!) 155/67   Pulse (!) 51   Ht '5\' 9"'$  (1.753 m)   Wt 203 lb (92.1 kg)   BMI 29.98 kg/m   Constitutional:  Alert and oriented, No acute distress. HEENT: [place] AT, moist mucus membranes.  Trachea midline, no masses. Skin: No rashes, bruises or suspicious lesions. Neurologic: Grossly intact, no focal deficits, moving all 4 extremities. Psychiatric: Normal mood and affect.  Laboratory Data: Lab Results  Component Value Date   WBC 8.9 06/21/2021   HGB 13.4 06/21/2021   HCT 41.3 06/21/2021   MCV 89.0 06/21/2021   PLT 223 06/21/2021    Lab Results  Component Value Date   CREATININE 1.55 (H) 06/23/2021    Urinalysis UA today with 3-10 red blood cells per high-powered field   Pertinent Imaging: Multiple films are personally reviewed today including his PET scan from 2020 as well as abdominal MRI from 2020 with special attention to the left adrenal gland  Assessment & Plan:    1. Benign prostatic hyperplasia with urinary frequency Worsening urinary symptoms with microscopic hematuria as well as a  questionable history of a bladder mass  I recommended cystoscopy/TRUS to help further evaluate his prostate and bladder anatomy which would help rule out underlying pathology as well as guide management especially given the lack of improvement with Flomax  He may stop Flomax for the time being  He is agreeable with this plan - Urinalysis, Complete - Bladder Scan (Post Void Residual) in office - PSA; Future - PSA  2. Adrenal adenoma, unspecified laterality History of slowly enlarging left adrenal adenoma which was mildly metabolically active back in 2020  Given that he is a 89 and otherwise doing well, we discussed whether or not to pursue any interval surveillance imaging.  We discussed the risk and benefits.  He is not sure if he would like to do this which is reasonable.  He would likely not be a candidate for most intervention if this were in fact a malignancy.  He is already will think about it and let us know. - PSA; Future - PSA  3. Microscopic hematuria As above - PSA; Future - PSA   Return in about 4 weeks (around 02/14/2022) for cysto/ TRUS.  Hollice Espy, MD  [facility] [address] [place], [place] [address] [phone]

## 2022-01-18 LAB — URINALYSIS, COMPLETE
Bilirubin, UA: NEGATIVE
Ketones, UA: NEGATIVE
Leukocytes,UA: NEGATIVE
Nitrite, UA: NEGATIVE
Specific Gravity, UA: 1.015 (ref 1.005–1.030)
Urobilinogen, Ur: 0.2 mg/dL (ref 0.2–1.0)
pH, UA: 5 (ref 5.0–7.5)

## 2022-01-18 LAB — PSA: Prostate Specific Ag, Serum: 41.7 ng/mL — ABNORMAL HIGH (ref 0.0–4.0)

## 2022-01-18 LAB — MICROSCOPIC EXAMINATION

## 2022-01-19 ENCOUNTER — Telehealth: Payer: Self-pay | Admitting: Urology

## 2022-01-19 DIAGNOSIS — R972 Elevated prostate specific antigen [PSA]: Secondary | ICD-10-CM

## 2022-01-19 NOTE — Telephone Encounter (Signed)
Daughter, Shauna Hugh calling stating that her dad called her confused about the phone call. Discussing the PET scan and the PSA. I read her the message and she was stunned. Daughter wanted to know if they should keep the 01/31/22 appt? Cysto/trus?

## 2022-01-19 NOTE — Telephone Encounter (Signed)
PSA of 41.  I called the patient to discuss this finding.  If he does have locally advanced prostate cancer, this may be contribute to his urinary symptoms he likely benefit from palliative ADT.  Prefer to avoid prostate biopsy in this elderly gentleman due to concern for complications.  He is also on Xarelto.  Given his marked PSA elevation, I recommended PMSA PET scan.  He is agreeable to this.  We will try to get this approved and completed prior to his follow-up if possible.

## 2022-01-19 NOTE — Telephone Encounter (Signed)
Yes, will push it out if PET not completed yet  Hollice Espy, MD

## 2022-01-19 NOTE — Telephone Encounter (Signed)
Spoke with daughter and she will call to change the appt if the pet scan is not before the 01/31/22 appt

## 2022-01-30 ENCOUNTER — Ambulatory Visit: Payer: Medicare Other

## 2022-01-31 ENCOUNTER — Other Ambulatory Visit: Payer: Medicare Other | Admitting: Urology

## 2022-02-07 ENCOUNTER — Ambulatory Visit
Admission: RE | Admit: 2022-02-07 | Discharge: 2022-02-07 | Disposition: A | Discharge status: Home / Self Care | Payer: Medicare Other | Source: Ambulatory Visit | Attending: Urology | Admitting: Urology

## 2022-02-07 DIAGNOSIS — R972 Elevated prostate specific antigen [PSA]: Secondary | ICD-10-CM | POA: Diagnosis present

## 2022-02-07 DIAGNOSIS — E279 Disorder of adrenal gland, unspecified: Secondary | ICD-10-CM | POA: Insufficient documentation

## 2022-02-07 DIAGNOSIS — C7951 Secondary malignant neoplasm of bone: Secondary | ICD-10-CM | POA: Insufficient documentation

## 2022-02-07 DIAGNOSIS — R9389 Abnormal findings on diagnostic imaging of other specified body structures: Secondary | ICD-10-CM | POA: Insufficient documentation

## 2022-02-07 DIAGNOSIS — Z8551 Personal history of malignant neoplasm of bladder: Secondary | ICD-10-CM | POA: Diagnosis not present

## 2022-02-07 MED ORDER — PIFLIFOLASTAT F 18 (PYLARIFY) INJECTION
9.0000 | Freq: Once | INTRAVENOUS | Status: AC
  Administered 2022-02-07: 9.6 via INTRAVENOUS

## 2022-02-14 ENCOUNTER — Encounter: Payer: Self-pay | Admitting: Urology

## 2022-02-14 ENCOUNTER — Ambulatory Visit (INDEPENDENT_AMBULATORY_CARE_PROVIDER_SITE_OTHER): Payer: Medicare Other | Admitting: Urology

## 2022-02-14 VITALS — BP 137/63 | HR 80 | Ht 69.0 in | Wt 194.0 lb

## 2022-02-14 DIAGNOSIS — N401 Enlarged prostate with lower urinary tract symptoms: Secondary | ICD-10-CM

## 2022-02-14 DIAGNOSIS — R350 Frequency of micturition: Secondary | ICD-10-CM

## 2022-02-14 DIAGNOSIS — R972 Elevated prostate specific antigen [PSA]: Secondary | ICD-10-CM | POA: Diagnosis not present

## 2022-02-14 DIAGNOSIS — R3129 Other microscopic hematuria: Secondary | ICD-10-CM

## 2022-02-14 LAB — URINALYSIS, COMPLETE
Bilirubin, UA: NEGATIVE
Ketones, UA: NEGATIVE
Leukocytes,UA: NEGATIVE
Nitrite, UA: NEGATIVE
Specific Gravity, UA: 1.025 (ref 1.005–1.030)
Urobilinogen, Ur: 0.2 mg/dL (ref 0.2–1.0)
pH, UA: 5 (ref 5.0–7.5)

## 2022-02-14 LAB — MICROSCOPIC EXAMINATION

## 2022-02-14 MED ORDER — GEMTESA 75 MG PO TABS: 75.0000 mg | ORAL_TABLET | Freq: Every day | ORAL | 0 refills | Status: DC

## 2022-02-14 MED ORDER — ORGOVYX 120 MG PO TABS: 120.0000 mg | ORAL_TABLET | Freq: Every day | ORAL | 0 refills | Status: DC

## 2022-02-14 NOTE — Patient Instructions (Signed)
Discussed importance of bone health on ADT, recommend 1000-1200 mg daily calcium suppliment and 800-1000 IU vit D daily.  Also encouraged weight being exercises and cardiovascular health.  

## 2022-02-14 NOTE — Progress Notes (Signed)
02/14/2022 2:52 PM   Dennis Savage Nov 10, 1929 [id number]  Referring provider: Baxter Hire, MD [place],  [place] [address]  Chief Complaint  Patient presents with   Cysto    HPI: 88 year old male who presents today specifically to discuss PET scan results.  Specifically, he presented last month with worsening urinary symptoms including urgency, frequency, and sensation of incomplete bladder emptying.  This has become progressively worse.  As part of this work-up, he had a PSA which was markedly abnormal, at 41.7.  He underwent PSMA PET scan with interestingly no evidence of prostate cancer within the prostate however avid nodal mets to the right iliac vessel including the common iliac, external and internal iliac as well as probable serosal implant along the right border of the rectum.  He underwent cystoscopy TRUS today as well for further evaluation of his urinary symptoms.  He denies any bone pain, weight loss.  His primary concern is quality of life related to his significant urinary frequency, at least every 1 hour both day and nighttime.   PMH: 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   Apnea, sleep 07/13/2015   Arthralgia of multiple joints 06/15/2011   Arthritis    Artificial lens present 12/08/2010   Atrial fibrillation (HCC)    Benign essential HTN 11/03/2010   CHF (congestive heart failure) (HCC)    Chronic obstructive pulmonary disease ([place]) 11/03/2010   CN (constipation) 06/15/2011   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   Dizziness 02/25/2015   GERD (gastroesophageal reflux disease)    Heart murmur    Hypertension    Interstitial lung disease ([place]) 10/13/2013   LBP (low back pain) 11/03/2010   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   Peripheral vascular disease ([place]) 11/03/2010   Retinal hemorrhage 03/16/2014    Surgical  History: Past Surgical History:  Procedure Laterality Date   APPENDECTOMY     BACK SURGERY     CARPAL TUNNEL RELEASE Bilateral    EYE SURGERY Bilateral    Cataract Extraction with IOL   HERNIA REPAIR     Umbilical Hernia X 3   JOINT REPLACEMENT     bilat knees   NASAL SINUS SURGERY     REPLACEMENT TOTAL KNEE Bilateral    SHOULDER SURGERY Right    TONSILLECTOMY     TOTAL HIP ARTHROPLASTY Right 10/12/2015   Procedure: TOTAL HIP ARTHROPLASTY;  Surgeon: Dereck Leep, MD;  Location: [facility] ORS;  Service: Orthopedics;  Laterality: Right;    Home Medications:  Allergies as of 02/14/2022       Reactions   Lisinopril Cough   Penicillins Hives, Swelling   Has patient had a PCN reaction causing immediate rash, facial/tongue/throat swelling, SOB or lightheadedness with hypotension: yES Has patient had a PCN reaction causing severe rash involving mucus membranes or skin necrosis: UNKNOWN Has patient had a PCN reaction that required hospitalization NO Has patient had a PCN reaction occurring within the last 10 years: nO If all of the above answers are "NO", then may proceed with Cephalosporin use.        Medication List        Accurate as of February 14, 2022 11:59 PM. If you have any questions, ask your nurse or doctor.          acetaminophen 325 MG tablet Commonly known as: TYLENOL Take 650 mg by mouth every 6 (six) hours  as needed.   amLODipine 10 MG tablet Commonly known as: NORVASC Take 10 mg by mouth daily.   chlorthalidone 25 MG tablet Commonly known as: HYGROTON Take 25 mg by mouth every other day. Patient taking every other day   docusate sodium 250 MG capsule Commonly known as: COLACE Take 250 mg by mouth daily as needed for constipation.   empagliflozin 10 MG Tabs tablet Commonly known as: JARDIANCE Take 1 tablet (10 mg total) by mouth daily.   Gemtesa 75 MG Tabs Generic drug: Vibegron Take 75 mg by mouth daily at 6 (six) AM. Started by: Hollice Espy,  MD   Orgovyx 120 MG Tabs Generic drug: Relugolix Take 120 mg by mouth daily. Started by: Hollice Espy, MD   PRESERVISION AREDS 2 PO Take by mouth.   Rivaroxaban 15 MG Tabs tablet Commonly known as: XARELTO Take 1 tablet (15 mg total) by mouth daily with supper.   tamsulosin 0.4 MG Caps capsule Commonly known as: FLOMAX Take 0.4 mg by mouth daily.        Allergies:  Allergies  Allergen Reactions   Lisinopril Cough   Penicillins Hives and Swelling    Has patient had a PCN reaction causing immediate rash, facial/tongue/throat swelling, SOB or lightheadedness with hypotension: yES Has patient had a PCN reaction causing severe rash involving mucus membranes or skin necrosis: UNKNOWN Has patient had a PCN reaction that required hospitalization NO Has patient had a PCN reaction occurring within the last 10 years: nO If all of the above answers are "NO", then may proceed with Cephalosporin use.    Family History: Family History  Problem Relation Age of Onset   Bladder Cancer Neg Hx    Kidney cancer Neg Hx    Prostate cancer Neg Hx     Social History:  reports that he has quit smoking. His smoking use included cigarettes. He smoked an average of 1 pack per day. He has never used smokeless tobacco. He reports that he does not drink alcohol and does not use drugs.   Physical Exam: BP 137/63   Pulse 80   Ht '5\' 9"'$  (1.753 m)   Wt 194 lb (88 kg)   BMI 28.65 kg/m   Constitutional:  Alert and oriented, No acute distress.  He is accompanied today by his son and daughter. HEENT: [employer] AT, moist mucus membranes.  Trachea midline, no masses. Cardiovascular: No clubbing, cyanosis, or edema. Respiratory: Normal respiratory effort, no increased work of breathing. Neurologic: Grossly intact, no focal deficits, moving all 4 extremities. Psychiatric: Normal mood and affect.  Laboratory Data: Lab Results  Component Value Date   WBC 8.9 06/21/2021   HGB 13.4 06/21/2021   HCT 41.3  06/21/2021   MCV 89.0 06/21/2021   PLT 223 06/21/2021    Lab Results  Component Value Date   CREATININE 1.55 (H) 06/23/2021    Urinalysis Results for orders placed or performed in visit on 02/14/22  Microscopic Examination   Urine  Result Value Ref Range   WBC, UA 0-5 0 - 5 /hpf   RBC, Urine 3-10 (A) 0 - 2 /hpf   Epithelial Cells (non renal) 0-10 0 - 10 /hpf   Mucus, UA Present (A) Not Estab.   Bacteria, UA Moderate (A) None seen/Few  Urinalysis, Complete  Result Value Ref Range   Specific Gravity, UA 1.025 1.005 - 1.030   pH, UA 5.0 5.0 - 7.5   Color, UA Yellow Yellow   Appearance Ur Clear Clear  Leukocytes,UA Negative Negative   Protein,UA 3+ (A) Negative/Trace   Glucose, UA 2+ (A) Negative   Ketones, UA Negative Negative   RBC, UA Trace (A) Negative   Bilirubin, UA Negative Negative   Urobilinogen, Ur 0.2 0.2 - 1.0 mg/dL   Nitrite, UA Negative Negative   Microscopic Examination See below:      Pertinent Imaging: IMPRESSION: 1. No discrete prostate carcinoma within the prostate gland. 2. PSMA avid prostate cancer nodal metastasis to the RIGHT iliac vessels including common iliac, external iliac and internal iliac. 3. Probable serosal implant along the RIGHT border of the rectum. 4. No evidence of metastatic adenopathy outside the pelvis. No visceral metastasis. 5. No evidence of skeletal metastasis. 6. interval enlargement of LEFT adrenal mass. Indeterminate on comparison MRI. Low FDG activity. No PSMA PET activity.     Electronically Signed   By: Suzy Bouchard M.D.   On: 02/09/2022 12:48    Assessment & Plan:    1.  Metastatic prostate cancer/urinary frequency Screening and diagnostic evaluation performed in light of his significant worsening urinary symptoms, suspect his urinary symptoms may be exacerbated by locally advanced prostate cancer  He does meet the clinical criteria for diagnosis of prostate cancer including markedly elevated PSA and  positive PSMA PET scan with relatively high sensitivity and specificity  We discussed the option of palliative ADT today which may improve his urinary symptoms as well as slow the progression of his disease.  We discussed the option of Depo injection versus oral ADT in the form of Orgovyx.  Risk and benefits of this were discussed in detail including risk of hot flashes, night sweats, loss of muscle mass, loss of bone density, long-term cardiovascular side effects as well as sexual side effects.  He would like to try Orgovyx and then reassess his urinary symptoms in a few months.  This is reasonable.  In the interim, he was given samples of Gemtesa 75 mg for the next month to see if this helps with his urinary frequency. - Urinalysis, Complete  2. Microscopic hematuria S/p work up as above  Cystoscopy today with friable prostatic vessels, likely source of microscopic blood   Return in about 4 months (around 06/15/2022) for PSA/ IPSS/ PVR.  Hollice Espy, MD  [facility] [address] [place], [place] [address] [phone]

## 2022-02-14 NOTE — Progress Notes (Unsigned)
02/14/22  Chief Complaint  Patient presents with   Cysto     HPI: 88 y.o. year-old male with refractory urinary symptoms related to BPH/ clinical dx prostate cancer who presents today to the office for cystoscopy and prostate sizing.   Please see previous notes for details.     Blood pressure 137/63, pulse 80, height '5\' 9"'$  (1.753 m), weight 194 lb (88 kg). NED. A&Ox3.   No respiratory distress   Abd soft, NT, ND Normal phallus with bilateral descended testicles    Cystoscopy Procedure Note  Patient identification was confirmed, informed consent was obtained, and patient was prepped using Betadine solution.  Lidocaine jelly was administered per urethral meatus.    Preoperative abx where received prior to procedure.     Pre-Procedure: - Inspection reveals a normal caliber ureteral meatus.  Procedure: The flexible cystoscope was introduced without difficulty - No urethral strictures/lesions are present. - Enlarged prostate bilobar coaptation, tight prostatic fossa with friable vessels - Normal bladder neck - Bilateral ureteral orifices identified - Bladder mucosa  reveals no ulcers, tumors, or lesions - No bladder stones -Mild trabeculation  Retroflexion shows unremarkable   Post-Procedure: - Patient tolerated the procedure well   Prostate transrectal ultrasound sizing   Informed consent was obtained after discussing risks/benefits of the procedure.  A time out was performed to ensure correct patient identity.   Pre-Procedure: -Transrectal probe was placed without difficulty -Transrectal Ultrasound performed revealing a 35 gm prostate measuring 3.17 x 3.94 x 5.36 cm (length) -No significant hypoechoic or median lobe noted        Hollice Espy, MD

## 2022-02-14 NOTE — Progress Notes (Signed)
(  Key: [id number])  Your information has been submitted to [facility]. [employer] will review the request and will issue a decision, typically within 3-7 days from your submission. You can check the updated outcome later by reopening this request.  If [employer] has not responded in 3-7 days or if you have any questions about your ePA request, please contact [employer] at [phone]. If you think there may be a problem with your PA request, use our live chat feature at the bottom right.  For [place] requests, please call [phone].

## 2022-02-15 NOTE — Progress Notes (Signed)
KeyUnice Cobble - PA Case ID: [id number] Need help? Call us at [phone] Outcome Deniedon November 1 You asked for the drug above for your benign prostatic hyperplasia and elevated prostate. This is an off-label use that is not medically accepted. The Medicare rule in the Prescription Drug Benefit Manual (Chapter 6, Section 10.6) says a drug must be used for a medically accepted indication (covered use). Off-label use is medically accepted when there is proof in one or more of the drug guides that the drug works for your condition. We look at the two major drug guides (compendia): the [place] and the [place] (AHFS-DI). [employer] has decided that there is no proof in either drug guide that this drug works for your condition. Per Medicare rules, it is not covered.

## 2022-02-15 NOTE — Progress Notes (Signed)
(  Key: [id number])  Your information has been submitted to [facility]. [employer] will review the request and will issue a decision, typically within 3-7 days from your submission. You can check the updated outcome later by reopening this request.  If [employer] has not responded in 3-7 days or if you have any questions about your ePA request, please contact [employer] at [phone]. If you think there may be a problem with your PA request, use our live chat feature at the bottom right.  For [place] requests, please call [phone].

## 2022-02-15 NOTE — Progress Notes (Signed)
This appeal has received a Favorable outcome.  Please note any additional information provided by [facility] at the bottom of this request.  PA Case: [id number], Status: Approved, Coverage Starts on: 04/16/2021 12:00:00 AM, Coverage Ends on: 04/16/2023 12:00:00 AM. Questions? Contact [phone].

## 2022-02-16 ENCOUNTER — Ambulatory Visit
Admission: RE | Admit: 2022-02-16 | Discharge: 2022-02-16 | Disposition: A | Discharge status: Home / Self Care | Payer: Medicare Other | Source: Ambulatory Visit | Attending: Gastroenterology | Admitting: Gastroenterology

## 2022-02-16 ENCOUNTER — Encounter: Payer: Self-pay | Admitting: Gastroenterology

## 2022-02-16 DIAGNOSIS — R1314 Dysphagia, pharyngoesophageal phase: Secondary | ICD-10-CM | POA: Insufficient documentation

## 2022-02-16 DIAGNOSIS — K219 Gastro-esophageal reflux disease without esophagitis: Secondary | ICD-10-CM | POA: Insufficient documentation

## 2022-02-19 ENCOUNTER — Telehealth: Payer: Self-pay

## 2022-02-19 NOTE — Telephone Encounter (Signed)
Incoming call from pharmacist requesting clarification on whether or not patient needs loading dose of Orgovyx. In addition she is requesting refills for maintenance dose and refills (this can be given as a verbal order) Pharmacist also requested last office notes and medication list, both faxed to [phone]. Please advise.

## 2022-02-19 NOTE — Telephone Encounter (Signed)
Yes, needs loading dose x 1  Hollice Espy, MD

## 2022-02-20 NOTE — Telephone Encounter (Signed)
Spoke to [place] the pharmacist for Orgovyx and loading dose will be dispensed then the regular RX will be sent there after.

## 2022-02-28 ENCOUNTER — Telehealth: Payer: Self-pay | Admitting: Urology

## 2022-02-28 NOTE — Telephone Encounter (Signed)
Pt's daughter, Shauna Hugh, would like a call back concerning pt's RX for Orgovyx.

## 2022-02-28 NOTE — Telephone Encounter (Signed)
Spoke with patients daughter and clarified that they will get a call from [facility] for the Orgovyx.

## 2022-03-02 NOTE — Progress Notes (Addendum)
Modified Barium Swallow Progress Note  Patient Details  Name: Dennis Savage MRN: [id number] Date of Birth: 09-Jun-1929  Today's Date: 03/02/2022  Modified Barium Swallow completed.  Full report located under Chart Review in the Imaging Section.  Brief recommendations include the following:  Clinical Impression  Pt is a 88 year old male who was referred by his PCP, Dr Harrel Lemon, for a Modified Barium Swallow Study d/t complaint of coughing when eating/drinking. Pt appeared to be a good historian and reports "this has been goiong on for years." He further explains decades. Pt with no immediate history of pneumonia or respiratory decline.       Pt presents with adequate oropharyngeal abilities when consuming thin liquids, nectar thick liquids, puree and graham cracker. While there appear to be ostephytes at C6 and C7, they don't impede bolus passage into the UES. At this time, recommend pt consume regular diet with thin liquids, medicine whole with thin liquids.   Swallow Evaluation Recommendations       SLP Diet Recommendations: Regular solids;Thin liquid   Liquid Administration via: Cup   Medication Administration: Whole meds with liquid       Compensations: Minimize environmental distractions;Slow rate;Small sips/bites   Postural Changes: Remain semi-upright after after feeds/meals (Comment);Seated upright at 90 degrees   Oral Care Recommendations: Oral care BID      Zsofia Prout B. Rutherford Nail, M.S., CCC-SLP, Mining engineer Certified Brain Injury [place]  [place] Office [phone] Ascom [phone] Fax [phone]

## 2022-04-16 ENCOUNTER — Other Ambulatory Visit: Payer: Self-pay | Admitting: Urology

## 2022-06-13 ENCOUNTER — Other Ambulatory Visit: Payer: Self-pay

## 2022-06-13 ENCOUNTER — Other Ambulatory Visit: Payer: Medicare Other

## 2022-06-13 DIAGNOSIS — R972 Elevated prostate specific antigen [PSA]: Secondary | ICD-10-CM

## 2022-06-13 DIAGNOSIS — N401 Enlarged prostate with lower urinary tract symptoms: Secondary | ICD-10-CM

## 2022-06-14 LAB — PSA: Prostate Specific Ag, Serum: 0.2 ng/mL (ref 0.0–4.0)

## 2022-06-15 ENCOUNTER — Encounter: Payer: Self-pay | Admitting: Urology

## 2022-06-15 ENCOUNTER — Ambulatory Visit (INDEPENDENT_AMBULATORY_CARE_PROVIDER_SITE_OTHER): Payer: Medicare Other | Admitting: Urology

## 2022-06-15 VITALS — BP 146/76 | HR 63 | Ht 69.0 in | Wt 193.6 lb

## 2022-06-15 DIAGNOSIS — C61 Malignant neoplasm of prostate: Secondary | ICD-10-CM | POA: Diagnosis not present

## 2022-06-15 DIAGNOSIS — N401 Enlarged prostate with lower urinary tract symptoms: Secondary | ICD-10-CM

## 2022-06-15 LAB — BLADDER SCAN AMB NON-IMAGING

## 2022-06-15 NOTE — Progress Notes (Signed)
Dennis Savage,acting as a scribe for Hollice Espy, MD.,have documented all relevant documentation on the behalf of Hollice Espy, MD,as directed by  Hollice Espy, MD while in the presence of Hollice Espy, MD.  06/15/2022 11:46 AM   Lorenso Courier Aug 31, 1929 [id number]  Referring provider: Baxter Hire, MD [place],  [place] [address]  Chief Complaint  Patient presents with   Benign Prostatic Hypertrophy    HPI: 89 year-old male who presents today for follow-up of prostate cancer.   He presented in 02/2022 with worsening urinary symptoms, insensation, and incomplete bladder emptying. He had a PSA that was abnormal at 41.7 on 01/17/2022. He had a PSMA PET scan which demonstrated avid nodal mets at the right iliac, including common iliac, external and internal iliacs along with possible cirrhosal implant near the border of his rectum. He met the criteria for a clinical diagnosis of prostate cancer. We elected to defer biopsy. He elected to be managed with palliative ADT in the form of Orgovyx. He was also on Flomax and Gemtesa for his urinary symptoms but is no longer taking these medications.   His PSA has dropped to 0.2 on 06/13/2022. His creatinine is at his baseline of 1.5. His CMP is unremarkable. He had these labs performed with his PCP.   Today, he notes minimal urinary symptoms. He reports lessening nocturia, only voiding a couple of times a night versus every hour as before. He does note an intermittent stream and it takes a while to feel like he completely voids. He reports occasional hot flashes that resolve quickly as a side effect of the medications.   Results for orders placed or performed in visit on 06/15/22  Bladder Scan (Post Void Residual) in office  Result Value Ref Range   Scan Result 98m     IPSS     Row Name 06/15/22 1000         International Prostate Symptom Score   How often have you had the sensation of not emptying your  bladder? Less than 1 in 5     How often have you had to urinate less than every two hours? Not at All     How often have you found you stopped and started again several times when you urinated? Almost always     How often have you found it difficult to postpone urination? Less than 1 in 5 times     How often have you had a weak urinary stream? Almost always     How often have you had to strain to start urination? Not at All     How many times did you typically get up at night to urinate? 2 Times     Total IPSS Score 14       Quality of Life due to urinary symptoms   If you were to spend the rest of your life with your urinary condition just the way it is now how would you feel about that? Mixed              Score:  1-7 Mild 8-19 Moderate 20-35 Severe   PMH: 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   Apnea, sleep 07/13/2015   Arthralgia of multiple joints 06/15/2011   Arthritis    Artificial lens present 12/08/2010   Atrial fibrillation (HCC)    Benign essential  HTN 11/03/2010   CHF (congestive heart failure) (HCC)    Chronic obstructive pulmonary disease ([facility]) 11/03/2010   CN (constipation) 06/15/2011   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   Dizziness 02/25/2015   GERD (gastroesophageal reflux disease)    Heart murmur    Hypertension    Interstitial lung disease (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   Peripheral vascular disease ([place]) 11/03/2010   Retinal hemorrhage 03/16/2014    Surgical History: Past Surgical History:  Procedure Laterality Date   APPENDECTOMY     BACK SURGERY     CARPAL TUNNEL RELEASE Bilateral    EYE SURGERY Bilateral    Cataract Extraction with IOL   HERNIA REPAIR     Umbilical Hernia X 3   JOINT REPLACEMENT     bilat knees   NASAL SINUS SURGERY     REPLACEMENT TOTAL KNEE Bilateral     SHOULDER SURGERY Right    TONSILLECTOMY     TOTAL HIP ARTHROPLASTY Right 10/12/2015   Procedure: TOTAL HIP ARTHROPLASTY;  Surgeon: Dereck Leep, MD;  Location: [facility] ORS;  Service: Orthopedics;  Laterality: Right;    Home Medications:  Allergies as of 06/15/2022       Reactions   Lisinopril Cough   Penicillins Hives, Swelling   Has patient had a PCN reaction causing immediate rash, facial/tongue/throat swelling, SOB or lightheadedness with hypotension: yES Has patient had a PCN reaction causing severe rash involving mucus membranes or skin necrosis: UNKNOWN Has patient had a PCN reaction that required hospitalization NO Has patient had a PCN reaction occurring within the last 10 years: nO If all of the above answers are "NO", then may proceed with Cephalosporin use.        Medication List        Accurate as of June 15, 2022 11:46 AM. If you have any questions, ask your nurse or doctor.          STOP taking these medications    Gemtesa 75 MG Tabs Generic drug: Vibegron Stopped by: Hollice Espy, MD   tamsulosin 0.4 MG Caps capsule Commonly known as: FLOMAX Stopped by: Hollice Espy, MD       TAKE these medications    acetaminophen 325 MG tablet Commonly known as: TYLENOL Take 650 mg by mouth every 6 (six) hours as needed.   amLODipine 10 MG tablet Commonly known as: NORVASC Take 10 mg by mouth daily.   CALCIUM-VITAMIN D PO Take by mouth.   chlorthalidone 25 MG tablet Commonly known as: HYGROTON Take 25 mg by mouth every other day. Patient taking every other day   docusate sodium 250 MG capsule Commonly known as: COLACE Take 250 mg by mouth daily as needed for constipation.   empagliflozin 10 MG Tabs tablet Commonly known as: JARDIANCE Take 1 tablet (10 mg total) by mouth daily.   Fish Oil Omega-3 1000 MG Caps Take 1,000 mg by mouth daily.   fluticasone 50 MCG/ACT nasal spray Commonly known as: FLONASE Place 1 spray into both nostrils  daily.   levalbuterol 45 MCG/ACT inhaler Commonly known as: XOPENEX HFA Inhale 2 puffs into the lungs every 4 (four) hours as needed for wheezing.   omeprazole 20 MG capsule Commonly known as: PRILOSEC Take 20 mg by mouth daily.   Orgovyx 120 MG Tabs Generic drug: Relugolix TAKE 1 TABLET BY MOUTH ONCE DAILY   predniSONE 10 MG tablet Commonly known as: DELTASONE Take 10 mg by mouth daily.  PRESERVISION AREDS 2 PO Take by mouth.   Trelegy Ellipta 100-62.5-25 MCG/ACT Aepb Generic drug: Fluticasone-Umeclidin-Vilant Inhale into the lungs daily.   Xarelto 20 MG Tabs tablet Generic drug: rivaroxaban Take 1 tablet by mouth daily with supper. What changed: Another medication with the same name was removed. Continue taking this medication, and follow the directions you see here. Changed by: Hollice Espy, MD        Allergies:  Allergies  Allergen Reactions   Lisinopril Cough   Penicillins Hives and Swelling    Has patient had a PCN reaction causing immediate rash, facial/tongue/throat swelling, SOB or lightheadedness with hypotension: yES Has patient had a PCN reaction causing severe rash involving mucus membranes or skin necrosis: UNKNOWN Has patient had a PCN reaction that required hospitalization NO Has patient had a PCN reaction occurring within the last 10 years: nO If all of the above answers are "NO", then may proceed with Cephalosporin use.    Family History: Family History  Problem Relation Age of Onset   Bladder Cancer Neg Hx    Kidney cancer Neg Hx    Prostate cancer Neg Hx     Social History:  reports that he has quit smoking. His smoking use included cigarettes. He smoked an average of 1 pack per day. He has never used smokeless tobacco. He reports that he does not drink alcohol and does not use drugs.   Physical Exam: BP (!) 146/76 (BP Location: Left Arm, Patient Position: Sitting, Cuff Size: Normal)   Pulse 63   Ht '5\' 9"'$  (1.753 m)   Wt 193 lb 9.6  oz (87.8 kg)   BMI 28.59 kg/m   Constitutional:  Alert and oriented, No acute distress. HEENT: [place] AT, moist mucus membranes.  Trachea midline, no masses. Neurologic: Grossly intact, no focal deficits, moving all 4 extremities. Psychiatric: Normal mood and affect.  Assessment & Plan:    1. Prostate cancer - Palliative ADT seems to be doing well.  - Zero urinary symptoms. - We discussed how to limit side effects and long term complications, including weight bearing exercises, D-mannose, and a daily probiotic.  - We discussed the addition of calcium and Vitamin D supplementation.   Return in about 6 months (around 12/16/2022) for with PSA prior.  [facility] [address] [place], [place] [address] [phone]

## 2022-06-19 ENCOUNTER — Other Ambulatory Visit: Payer: Self-pay | Admitting: Urology

## 2022-10-12 ENCOUNTER — Other Ambulatory Visit: Payer: Self-pay | Admitting: Family Medicine

## 2022-10-12 DIAGNOSIS — M5416 Radiculopathy, lumbar region: Secondary | ICD-10-CM

## 2022-10-16 ENCOUNTER — Ambulatory Visit: Payer: Medicare Other

## 2022-10-16 ENCOUNTER — Other Ambulatory Visit: Payer: Self-pay | Admitting: Family Medicine

## 2022-10-16 ENCOUNTER — Ambulatory Visit
Admission: RE | Admit: 2022-10-16 | Discharge: 2022-10-16 | Disposition: A | Discharge status: Home / Self Care | Payer: Medicare Other | Source: Ambulatory Visit | Attending: Family Medicine | Admitting: Family Medicine

## 2022-10-16 DIAGNOSIS — M5416 Radiculopathy, lumbar region: Secondary | ICD-10-CM

## 2022-10-16 DIAGNOSIS — M1612 Unilateral primary osteoarthritis, left hip: Secondary | ICD-10-CM | POA: Diagnosis present

## 2022-10-28 ENCOUNTER — Inpatient Hospital Stay
Admission: EM | Admit: 2022-10-28 | Discharge: 2022-10-31 | DRG: 871 | Disposition: A | Discharge status: Home Health | Payer: Medicare Other | Attending: Student in an Organized Health Care Education/Training Program | Admitting: Student in an Organized Health Care Education/Training Program

## 2022-10-28 ENCOUNTER — Other Ambulatory Visit: Payer: Self-pay

## 2022-10-28 ENCOUNTER — Encounter: Payer: Self-pay | Admitting: Intensive Care

## 2022-10-28 ENCOUNTER — Emergency Department: Payer: Medicare Other

## 2022-10-28 DIAGNOSIS — E871 Hypo-osmolality and hyponatremia: Secondary | ICD-10-CM | POA: Diagnosis present

## 2022-10-28 DIAGNOSIS — Z96641 Presence of right artificial hip joint: Secondary | ICD-10-CM | POA: Diagnosis present

## 2022-10-28 DIAGNOSIS — J189 Pneumonia, unspecified organism: Secondary | ICD-10-CM | POA: Diagnosis not present

## 2022-10-28 DIAGNOSIS — I110 Hypertensive heart disease with heart failure: Secondary | ICD-10-CM | POA: Diagnosis present

## 2022-10-28 DIAGNOSIS — A419 Sepsis, unspecified organism: Secondary | ICD-10-CM | POA: Diagnosis present

## 2022-10-28 DIAGNOSIS — Z7984 Long term (current) use of oral hypoglycemic drugs: Secondary | ICD-10-CM

## 2022-10-28 DIAGNOSIS — Z1152 Encounter for screening for COVID-19: Secondary | ICD-10-CM | POA: Diagnosis not present

## 2022-10-28 DIAGNOSIS — J159 Unspecified bacterial pneumonia: Secondary | ICD-10-CM | POA: Diagnosis present

## 2022-10-28 DIAGNOSIS — Z79899 Other long term (current) drug therapy: Secondary | ICD-10-CM

## 2022-10-28 DIAGNOSIS — W1811XA Fall from or off toilet without subsequent striking against object, initial encounter: Secondary | ICD-10-CM | POA: Diagnosis present

## 2022-10-28 DIAGNOSIS — I480 Paroxysmal atrial fibrillation: Secondary | ICD-10-CM | POA: Insufficient documentation

## 2022-10-28 DIAGNOSIS — R531 Weakness: Secondary | ICD-10-CM

## 2022-10-28 DIAGNOSIS — Z880 Allergy status to penicillin: Secondary | ICD-10-CM | POA: Diagnosis not present

## 2022-10-28 DIAGNOSIS — I5032 Chronic diastolic (congestive) heart failure: Secondary | ICD-10-CM | POA: Diagnosis present

## 2022-10-28 DIAGNOSIS — K219 Gastro-esophageal reflux disease without esophagitis: Secondary | ICD-10-CM | POA: Diagnosis present

## 2022-10-28 DIAGNOSIS — Z888 Allergy status to other drugs, medicaments and biological substances status: Secondary | ICD-10-CM

## 2022-10-28 DIAGNOSIS — Z7901 Long term (current) use of anticoagulants: Secondary | ICD-10-CM | POA: Diagnosis not present

## 2022-10-28 DIAGNOSIS — J440 Chronic obstructive pulmonary disease with acute lower respiratory infection: Secondary | ICD-10-CM | POA: Diagnosis present

## 2022-10-28 DIAGNOSIS — J449 Chronic obstructive pulmonary disease, unspecified: Secondary | ICD-10-CM

## 2022-10-28 DIAGNOSIS — I4510 Unspecified right bundle-branch block: Secondary | ICD-10-CM | POA: Diagnosis present

## 2022-10-28 DIAGNOSIS — R6520 Severe sepsis without septic shock: Secondary | ICD-10-CM | POA: Diagnosis present

## 2022-10-28 DIAGNOSIS — I10 Essential (primary) hypertension: Secondary | ICD-10-CM | POA: Diagnosis not present

## 2022-10-28 DIAGNOSIS — Z96653 Presence of artificial knee joint, bilateral: Secondary | ICD-10-CM | POA: Diagnosis present

## 2022-10-28 DIAGNOSIS — Z87891 Personal history of nicotine dependence: Secondary | ICD-10-CM | POA: Diagnosis not present

## 2022-10-28 DIAGNOSIS — I739 Peripheral vascular disease, unspecified: Secondary | ICD-10-CM | POA: Diagnosis present

## 2022-10-28 DIAGNOSIS — J9601 Acute respiratory failure with hypoxia: Secondary | ICD-10-CM

## 2022-10-28 LAB — RESP PANEL BY RT-PCR (RSV, FLU A&B, COVID)  RVPGX2
Influenza A by PCR: NEGATIVE
Influenza B by PCR: NEGATIVE
Resp Syncytial Virus by PCR: NEGATIVE
SARS Coronavirus 2 by RT PCR: NEGATIVE

## 2022-10-28 LAB — BRAIN NATRIURETIC PEPTIDE: B Natriuretic Peptide: 438.9 pg/mL — ABNORMAL HIGH (ref 0.0–100.0)

## 2022-10-28 LAB — COMPREHENSIVE METABOLIC PANEL
ALT: 14 U/L (ref 0–44)
AST: 27 U/L (ref 15–41)
Albumin: 3.3 g/dL — ABNORMAL LOW (ref 3.5–5.0)
Alkaline Phosphatase: 52 U/L (ref 38–126)
Anion gap: 9 (ref 5–15)
BUN: 22 mg/dL (ref 8–23)
CO2: 26 mmol/L (ref 22–32)
Calcium: 8.7 mg/dL — ABNORMAL LOW (ref 8.9–10.3)
Chloride: 98 mmol/L (ref 98–111)
Creatinine, Ser: 1.54 mg/dL — ABNORMAL HIGH (ref 0.61–1.24)
GFR, Estimated: 43 mL/min — ABNORMAL LOW (ref >60)
Glucose, Bld: 157 mg/dL — ABNORMAL HIGH (ref 70–99)
Potassium: 3.6 mmol/L (ref 3.5–5.1)
Sodium: 133 mmol/L — ABNORMAL LOW (ref 135–145)
Total Bilirubin: 1.4 mg/dL — ABNORMAL HIGH (ref 0.3–1.2)
Total Protein: 6 g/dL — ABNORMAL LOW (ref 6.5–8.1)

## 2022-10-28 LAB — URINALYSIS, ROUTINE W REFLEX MICROSCOPIC
Bilirubin Urine: NEGATIVE
Glucose, UA: >=500 mg/dL — AB
Ketones, ur: NEGATIVE mg/dL
Leukocytes,Ua: NEGATIVE
Nitrite: NEGATIVE
Protein, ur: 100 mg/dL — AB
Specific Gravity, Urine: 1.018 (ref 1.005–1.030)
pH: 5 (ref 5.0–8.0)

## 2022-10-28 LAB — CBC WITH DIFFERENTIAL/PLATELET
Abs Immature Granulocytes: 0.09 10*3/uL — ABNORMAL HIGH (ref 0.00–0.07)
Basophils Absolute: 0 10*3/uL (ref 0.0–0.1)
Basophils Relative: 0 %
Eosinophils Absolute: 0 10*3/uL (ref 0.0–0.5)
Eosinophils Relative: 0 %
HCT: 38.1 % — ABNORMAL LOW (ref 39.0–52.0)
Hemoglobin: 12.9 g/dL — ABNORMAL LOW (ref 13.0–17.0)
Immature Granulocytes: 1 %
Lymphocytes Relative: 8 %
Lymphs Abs: 1.4 10*3/uL (ref 0.7–4.0)
MCH: 29.7 pg (ref 26.0–34.0)
MCHC: 33.9 g/dL (ref 30.0–36.0)
MCV: 87.8 fL (ref 80.0–100.0)
Monocytes Absolute: 1.8 10*3/uL — ABNORMAL HIGH (ref 0.1–1.0)
Monocytes Relative: 11 %
Neutro Abs: 13.4 10*3/uL — ABNORMAL HIGH (ref 1.7–7.7)
Neutrophils Relative %: 80 %
Platelets: 225 10*3/uL (ref 150–400)
RBC: 4.34 MIL/uL (ref 4.22–5.81)
RDW: 13.5 % (ref 11.5–15.5)
WBC: 16.7 10*3/uL — ABNORMAL HIGH (ref 4.0–10.5)
nRBC: 0 % (ref 0.0–0.2)

## 2022-10-28 LAB — BASIC METABOLIC PANEL
Anion gap: 11 (ref 5–15)
BUN: 22 mg/dL (ref 8–23)
CO2: 22 mmol/L (ref 22–32)
Calcium: 8.8 mg/dL — ABNORMAL LOW (ref 8.9–10.3)
Chloride: 99 mmol/L (ref 98–111)
Creatinine, Ser: 1.56 mg/dL — ABNORMAL HIGH (ref 0.61–1.24)
GFR, Estimated: 42 mL/min — ABNORMAL LOW (ref >60)
Glucose, Bld: 231 mg/dL — ABNORMAL HIGH (ref 70–99)
Potassium: 4 mmol/L (ref 3.5–5.1)
Sodium: 132 mmol/L — ABNORMAL LOW (ref 135–145)

## 2022-10-28 LAB — LACTIC ACID, PLASMA
Lactic Acid, Venous: 2.6 mmol/L (ref 0.5–1.9)
Lactic Acid, Venous: 2.7 mmol/L (ref 0.5–1.9)
Lactic Acid, Venous: 2.5 mmol/L (ref 0.5–1.9)

## 2022-10-28 LAB — CBC
HCT: 43.4 % (ref 39.0–52.0)
Hemoglobin: 14.1 g/dL (ref 13.0–17.0)
MCH: 29.7 pg (ref 26.0–34.0)
MCHC: 32.5 g/dL (ref 30.0–36.0)
MCV: 91.4 fL (ref 80.0–100.0)
Platelets: 221 10*3/uL (ref 150–400)
RBC: 4.75 MIL/uL (ref 4.22–5.81)
RDW: 13.5 % (ref 11.5–15.5)
WBC: 13.2 10*3/uL — ABNORMAL HIGH (ref 4.0–10.5)
nRBC: 0 % (ref 0.0–0.2)

## 2022-10-28 LAB — PROTIME-INR
INR: 1.4 — ABNORMAL HIGH (ref 0.8–1.2)
Prothrombin Time: 17.2 seconds — ABNORMAL HIGH (ref 11.4–15.2)

## 2022-10-28 LAB — APTT: aPTT: 31 seconds (ref 24–36)

## 2022-10-28 LAB — TROPONIN I (HIGH SENSITIVITY)
Troponin I (High Sensitivity): 65 ng/L — ABNORMAL HIGH (ref <18)
Troponin I (High Sensitivity): 82 ng/L — ABNORMAL HIGH (ref <18)

## 2022-10-28 MED ORDER — ACETAMINOPHEN 325 MG PO TABS: 650.0000 mg | ORAL_TABLET | Freq: Four times a day (QID) | ORAL | Status: DC | PRN

## 2022-10-28 MED ORDER — ENOXAPARIN SODIUM 40 MG/0.4ML IJ SOSY: 40.0000 mg | PREFILLED_SYRINGE | INTRAMUSCULAR | Status: DC

## 2022-10-28 MED ORDER — SODIUM CHLORIDE 0.9 % IV SOLN: INTRAVENOUS | Status: DC

## 2022-10-28 MED ORDER — ONDANSETRON HCL 4 MG/2ML IJ SOLN: 4.0000 mg | Freq: Four times a day (QID) | INTRAMUSCULAR | Status: DC | PRN

## 2022-10-28 MED ORDER — AMLODIPINE BESYLATE 10 MG PO TABS
10.0000 mg | ORAL_TABLET | Freq: Every day | ORAL | Status: DC
  Administered 2022-10-29 – 2022-10-31 (×3): 10 mg via ORAL
  Filled 2022-10-28 (×3): qty 1

## 2022-10-28 MED ORDER — LEVOFLOXACIN IN D5W 750 MG/150ML IV SOLN
750.0000 mg | Freq: Once | INTRAVENOUS | Status: AC
  Administered 2022-10-28: 750 mg via INTRAVENOUS
  Filled 2022-10-28: qty 150

## 2022-10-28 MED ORDER — SODIUM CHLORIDE 0.9 % IV SOLN: 2.0000 g | INTRAVENOUS | Status: DC

## 2022-10-28 MED ORDER — ACETAMINOPHEN 650 MG RE SUPP: 650.0000 mg | Freq: Four times a day (QID) | RECTAL | Status: DC | PRN

## 2022-10-28 MED ORDER — CHLORTHALIDONE 25 MG PO TABS: 25.0000 mg | ORAL_TABLET | ORAL | Status: DC

## 2022-10-28 MED ORDER — TRAZODONE HCL 50 MG PO TABS: 25.0000 mg | ORAL_TABLET | Freq: Every evening | ORAL | Status: DC | PRN

## 2022-10-28 MED ORDER — RIVAROXABAN 15 MG PO TABS
15.0000 mg | ORAL_TABLET | Freq: Every day | ORAL | Status: DC
  Administered 2022-10-28 – 2022-10-30 (×3): 15 mg via ORAL
  Filled 2022-10-28 (×4): qty 1

## 2022-10-28 MED ORDER — MAGNESIUM HYDROXIDE 400 MG/5ML PO SUSP: 30.0000 mL | Freq: Every day | ORAL | Status: DC | PRN

## 2022-10-28 MED ORDER — LACTATED RINGERS IV BOLUS (SEPSIS)
500.0000 mL | Freq: Once | INTRAVENOUS | Status: AC
  Administered 2022-10-28: 500 mL via INTRAVENOUS

## 2022-10-28 MED ORDER — PANTOPRAZOLE SODIUM 40 MG PO TBEC
40.0000 mg | DELAYED_RELEASE_TABLET | Freq: Every day | ORAL | Status: DC
  Administered 2022-10-29 – 2022-10-31 (×3): 40 mg via ORAL
  Filled 2022-10-28 (×3): qty 1

## 2022-10-28 MED ORDER — DOCUSATE SODIUM 50 MG PO CAPS: 250.0000 mg | ORAL_CAPSULE | Freq: Every day | ORAL | Status: DC | PRN

## 2022-10-28 MED ORDER — ONDANSETRON HCL 4 MG PO TABS: 4.0000 mg | ORAL_TABLET | Freq: Four times a day (QID) | ORAL | Status: DC | PRN

## 2022-10-28 MED ORDER — IPRATROPIUM-ALBUTEROL 0.5-2.5 (3) MG/3ML IN SOLN
3.0000 mL | Freq: Four times a day (QID) | RESPIRATORY_TRACT | Status: DC
  Administered 2022-10-29: 3 mL via RESPIRATORY_TRACT
  Filled 2022-10-28: qty 3

## 2022-10-28 MED ORDER — RELUGOLIX 120 MG PO TABS: 120.0000 mg | ORAL_TABLET | Freq: Every day | ORAL | Status: DC

## 2022-10-28 MED ORDER — SODIUM CHLORIDE 0.9 % IV SOLN: 500.0000 mg | INTRAVENOUS | Status: DC

## 2022-10-28 MED ORDER — EMPAGLIFLOZIN 10 MG PO TABS
10.0000 mg | ORAL_TABLET | Freq: Every day | ORAL | Status: DC
  Administered 2022-10-29 – 2022-10-31 (×3): 10 mg via ORAL
  Filled 2022-10-28 (×3): qty 1

## 2022-10-28 MED ORDER — LACTATED RINGERS IV SOLN
150.0000 mL/h | INTRAVENOUS | Status: AC
  Administered 2022-10-28 – 2022-10-29 (×2): 150 mL/h via INTRAVENOUS

## 2022-10-28 MED ORDER — CHLORTHALIDONE 25 MG PO TABS
25.0000 mg | ORAL_TABLET | ORAL | Status: DC
  Administered 2022-10-31: 25 mg via ORAL
  Filled 2022-10-28: qty 1

## 2022-10-28 MED ORDER — LEVOFLOXACIN IN D5W 750 MG/150ML IV SOLN: 750.0000 mg | INTRAVENOUS | Status: DC

## 2022-10-28 NOTE — ED Triage Notes (Signed)
Patient arrived by EMS from home for fall. Larey Seat off the toilet when trying to sit down. Found to have 87% sats on RA. Patient c/o SOB and new onset weakness. Baseline uses cane at home.   EMS vitals: 96% 2L O2 160/70 b/p 20G IV L AC

## 2022-10-28 NOTE — Assessment & Plan Note (Addendum)
-   We will continue his antihypertensive therapy. 

## 2022-10-28 NOTE — Assessment & Plan Note (Signed)
Continue Xarelto 

## 2022-10-28 NOTE — ED Provider Notes (Signed)
[facility] Provider Note    Event Date/Time   First MD Initiated Contact with Patient 10/28/22 1810     (approximate)  History   Chief Complaint: Fall and Weakness  HPI  Dennis Savage is a 89 y.o. male with a past medical history of arthritis, CHF, COPD, gastric reflux, hypertension, presents to the emergency department for worsening weakness.  According to the patient for the past 2 to 3 days he has been weak he is also had a mild cough per daughter.  No fever although 99.6 in the emergency department.  Patient states today he sat down on the commode due to weakness was unable to get up and fell off the commode.  No head injury.  Patient found to be hypoxic 87% on room air on arrival.  Physical Exam   Triage Vital Signs: ED Triage Vitals  Encounter Vitals Group     BP 10/28/22 1732 (!) 159/71     Systolic BP Percentile --      Diastolic BP Percentile --      Pulse Rate 10/28/22 1732 93     Resp 10/28/22 1732 (!) 24     Temp 10/28/22 1732 99.6 F (37.6 C)     Temp Source 10/28/22 1732 Oral     SpO2 10/28/22 1732 (!) 87 %     Weight 10/28/22 1735 190 lb (86.2 kg)     Height 10/28/22 1735 5' 9.5" (1.765 m)     Head Circumference --      Peak Flow --      Pain Score 10/28/22 1732 8     Pain Loc --      Pain Education --      Exclude from Growth Chart --     Most recent vital signs: Vitals:   10/28/22 1732 10/28/22 1823  BP: (!) 159/71 139/60  Pulse: 93 74  Resp: (!) 24 20  Temp: 99.6 F (37.6 C)   SpO2: (!) 87% 100%    General: Awake, no distress.  CV:  Good peripheral perfusion.  Regular rate and rhythm  Resp:  Mild tachypnea with rhonchi auscultated bilaterally that largely clears with coughing. Abd:  No distention.  Soft, nontender.  No rebound or guarding. Other:  No significant lower extreme edema noted.   ED Results / Procedures / Treatments   EKG  EKG viewed and interpreted by myself shows what appears to be a sinus rhythm  89 bpm with a slightly widened QRS, normal axis, normal intervals nonspecific ST changes.  RADIOLOGY  I have reviewed and interpreted chest x-ray images.  Patient has possible consolidation in the right lower medial section of his lung. Radiology is read the x-ray as cardiomegaly no signs of pulmonary edema there are new infiltrates in the lower lung suggesting atelectasis or pneumonia.  Possible interstitial edema.   MEDICATIONS ORDERED IN ED: Medications  lactated ringers bolus 500 mL (has no administration in time range)  levofloxacin (LEVAQUIN) IVPB 750 mg (has no administration in time range)     IMPRESSION / MDM / ASSESSMENT AND PLAN / ED COURSE  I reviewed the triage vital signs and the nursing notes.  Patient's presentation is most consistent with acute presentation with potential threat to life or bodily function.  Patient presents to the emergency department for worsening weakness cough states some mild shortness of breath.  99.6 in the emergency department with an 87% room air saturation no baseline O2 requirement.  Patient satting well on  2 L nasal cannula.  Chest x-ray shows concerns for possible pneumonia.  Patient has a history of CHF and has been admitted for pleural effusions he states in the past.  Lab work today shows troponin of 65 which is largely unchanged from historical values chemistry shows renal insufficiency at 1.56 again largely unchanged from historical values.  Patient's CBC does show mild leukocytosis 13,000 lactic acid is elevated 2.6 which could be consistent with infection given the chest x-ray findings we will cover with antibiotics given the patient's penicillin allergy we will cover with Levaquin.  Blood cultures have been sent.  I have also ordered a BNP and procalcitonin to help [place] differentiate between infectious etiology versus atypical edema.  Patient will be admitted to the hospital service for further workup and treatment.  Patient and family member  agreeable to plan of care.  CRITICAL CARE Performed by: Minna Antis   Total critical care time: 30 minutes  Critical care time was exclusive of separately billable procedures and treating other patients.  Critical care was necessary to treat or prevent imminent or life-threatening deterioration.  Critical care was time spent personally by me on the following activities: development of treatment plan with patient and/or surrogate as well as nursing, discussions with consultants, evaluation of patient's response to treatment, examination of patient, obtaining history from patient or surrogate, ordering and performing treatments and interventions, ordering and review of laboratory studies, ordering and review of radiographic studies, pulse oximetry and re-evaluation of patient's condition.   FINAL CLINICAL IMPRESSION(S) / ED DIAGNOSES   Pneumonia Dyspnea Respiratory failure/hypoxia   Note:  This document was prepared using Dragon voice recognition software and may include unintentional dictation errors.   Minna Antis, MD 10/28/22 1910

## 2022-10-28 NOTE — H&P (Signed)
[place]   PATIENT NAME: Dennis Savage    MR#:  [id number]  DATE OF BIRTH:  Jan 07, 1930  DATE OF ADMISSION:  10/28/2022  PRIMARY CARE PHYSICIAN: Gracelyn Nurse, MD   Patient is coming from: Home  REQUESTING/REFERRING PHYSICIAN: Minna Antis, MD [name]  CHIEF COMPLAINT:   Chief Complaint  Patient presents with   Fall   Weakness    HISTORY OF PRESENT ILLNESS:  Dennis Savage is a 89 y.o. Caucasian male with medical history significant for osteoarthritis, OSA, essential hypertension, GERD, PVD, osteoarthritis, CHF, COPD and ILD, who presented to the emergency room with acute onset of generalized weakness over the last 3 days with associated cough for the last couple of days with expectoration of whitish sputum as well as dyspnea with no wheezing.  He admits to chills but denies fever however his initial temp in the ER was 99.6.  Today he was sitting on his commode and felt so weak and slumped over and fell off of the commode.  He was too weak to get up.  He did not lose consciousness.  He denies any head injuries or other injuries.  He was found to be hypoxic to 87% on room air upon arrival.  No nausea or vomiting or diarrhea or abdominal pain.  No chest pain or palpitations.  No paresthesias or focal muscle weakness.  No dysuria, oliguria or hematuria, urgency or frequency or flank pain.   ED Course: Upon presentation to the emergency room BP was 159/71 with a heart rate of 24 and pulse currently 87% on room air and temp 99.6.  Labs revealed hyponatremia of 132, glucose of 231, creatinine of 1.56 and calcium 8.8.  High sensitive troponin I was 65 and lactic acid 2.6.  CBC showed leukocytosis of 13.2.  UA showed more than 500 glucose 100 protein rare bacteria and 11-20 RBCs with 0-5 WBCs. EKG as reviewed by me : EKG showed right bundle branch block with a rate of 89 and a likely normal sinus rhythm with RSR in V1 and Q waves inferiorly with no acute findings. Imaging: Two-view  chest x-ray showed cardiomegaly and new infiltrates in both lower lung fields suggesting atelectasis/pneumonia.  Interstitial edema would be in the differential diagnosis.  The patient was given IV Levaquin.  He will be admitted to a medical telemetry bed for further evaluation and management.  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   Dizziness 02/25/2015   GERD (gastroesophageal reflux disease)    Heart murmur    Hypertension    Interstitial lung disease (HCC) 10/13/2013   LBP (low back pain) 11/03/2010   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   Peripheral vascular disease (HCC) 11/03/2010   Retinal hemorrhage 03/16/2014    PAST SURGICAL HISTORY:   Past Surgical History:  Procedure Laterality Date   APPENDECTOMY     BACK SURGERY     CARPAL TUNNEL RELEASE Bilateral    EYE SURGERY Bilateral    Cataract Extraction with IOL  HERNIA REPAIR     Umbilical Hernia X 3   JOINT REPLACEMENT     bilat knees   NASAL SINUS SURGERY     REPLACEMENT TOTAL KNEE Bilateral    SHOULDER SURGERY Right    TONSILLECTOMY     TOTAL HIP ARTHROPLASTY Right 10/12/2015   Procedure: TOTAL HIP ARTHROPLASTY;  Surgeon: Donato Heinz, MD;  Location: [facility] ORS;  Service: Orthopedics;  Laterality: Right;    SOCIAL HISTORY:   Social History   Tobacco Use   Smoking status: Former    Current packs/day: 1.00    Types: Cigarettes   Smokeless tobacco: Never  Substance Use Topics   Alcohol use: No    FAMILY HISTORY:   Family  History  Problem Relation Age of Onset   Bladder Cancer Neg Hx    Kidney cancer Neg Hx    Prostate cancer Neg Hx     DRUG ALLERGIES:   Allergies  Allergen Reactions   Lisinopril Cough   Penicillins Hives and Swelling    Has patient had a PCN reaction causing immediate rash, facial/tongue/throat swelling, SOB or lightheadedness with hypotension: yES Has patient had a PCN reaction causing severe rash involving mucus membranes or skin necrosis: UNKNOWN Has patient had a PCN reaction that required hospitalization NO Has patient had a PCN reaction occurring within the last 10 years: nO If all of the above answers are "NO", then may proceed with Cephalosporin use.    REVIEW OF SYSTEMS:   ROS As per history of present illness. All pertinent systems were reviewed above. Constitutional, HEENT, cardiovascular, respiratory, GI, GU, musculoskeletal, neuro, psychiatric, endocrine, integumentary and hematologic systems were reviewed and are otherwise negative/unremarkable except for positive findings mentioned above in the HPI.   MEDICATIONS AT HOME:   Prior to Admission medications   Medication Sig Start Date End Date Taking? Authorizing Provider  acetaminophen (TYLENOL) 325 MG tablet Take 650 mg by mouth every 6 (six) hours as needed.    [provider]  amLODipine (NORVASC) 10 MG tablet Take 10 mg by mouth daily.    [provider]  CALCIUM-VITAMIN D PO Take by mouth.    [provider]  chlorthalidone (HYGROTON) 25 MG tablet Take 25 mg by mouth every other day. Patient taking every other day    [provider]  docusate sodium (COLACE) 250 MG capsule Take 250 mg by mouth daily as needed for constipation.    [provider]  empagliflozin (JARDIANCE) 10 MG TABS tablet Take 1 tablet (10 mg total) by mouth daily. 06/24/21   Hollice Espy, MD  fluticasone (FLONASE) 50 MCG/ACT nasal spray Place 1 spray into both nostrils daily. 05/11/22   [provider]  levalbuterol Pauline Aus HFA) 45 MCG/ACT inhaler Inhale 2 puffs into the lungs every 4 (four) hours as needed for wheezing. 05/11/22 05/11/23  [provider]  Multiple Vitamins-Minerals (PRESERVISION AREDS 2 PO) Take by mouth.    [provider]  Omega-3 Fatty Acids (FISH OIL OMEGA-3) 1000 MG CAPS Take 1,000 mg by mouth daily.    [provider]  omeprazole (PRILOSEC) 20 MG capsule Take 20 mg by mouth daily. 05/05/22   [provider]  relugolix (ORGOVYX) 120 MG tablet TAKE 1 TABLET BY MOUTH ONCE DAILY. 06/19/22   Vanna Scotland, MD  TRELEGY ELLIPTA 100-62.5-25 MCG/ACT AEPB Inhale into the lungs daily. 05/30/22   [provider]  XARELTO 20 MG TABS tablet Take 1 tablet by mouth daily with supper. 04/06/22  [provider]      VITAL SIGNS:  Blood pressure 139/60, pulse 74, temperature 99.6 F (37.6 C), temperature source Oral, resp. rate 20, height 5' 9.5" (1.765 m), weight 86.2 kg, SpO2 100%.  PHYSICAL EXAMINATION:  Physical Exam  GENERAL:  89 y.o.-year-old patient lying in the bed with mild respiratory distress with conversational dyspnea. EYES: Pupils equal, round, reactive to light and accommodation. No scleral icterus. Extraocular muscles intact.  HEENT: Head atraumatic, normocephalic. Oropharynx and nasopharynx clear.  NECK:  Supple, no jugular venous distention. No thyroid enlargement, no tenderness.  LUNGS: Diminished bibasal breath sounds with bibasal crackles.. No use of accessory muscles of respiration.  CARDIOVASCULAR: Irregularly irregular rhythm, S1, S2 normal. No murmurs, rubs, or gallops.  ABDOMEN: Soft, nondistended, nontender. Bowel sounds present. No organomegaly or mass.  EXTREMITIES: No pedal edema, cyanosis, or clubbing.  NEUROLOGIC: Cranial nerves II through XII are intact. Muscle strength 5/5 in all extremities. Sensation intact. Gait not checked.  PSYCHIATRIC: The patient is alert and oriented x 3.   Normal affect and good eye contact. SKIN: No obvious rash, lesion, or ulcer.   LABORATORY PANEL:   CBC Recent Labs  Lab 10/28/22 1741  WBC 13.2*  HGB 14.1  HCT 43.4  PLT 221   ------------------------------------------------------------------------------------------------------------------  Chemistries  Recent Labs  Lab 10/28/22 1741  NA 132*  K 4.0  CL 99  CO2 22  GLUCOSE 231*  BUN 22  CREATININE 1.56*  CALCIUM 8.8*   ------------------------------------------------------------------------------------------------------------------  Cardiac Enzymes No results for input(s): "TROPONINI" in the last 168 hours. ------------------------------------------------------------------------------------------------------------------  RADIOLOGY:  DG Chest 2 View  Result Date: 10/28/2022 CLINICAL DATA:  Shortness of breath EXAM: CHEST - 2 VIEW COMPARISON:  Previous studies including the examination of 06/19/2021 FINDINGS: Transverse diameter of heart is increased. There are no signs of alveolar pulmonary edema. Increased markings are seen in both lower lung fields. Left lateral CP angle is indistinct. There is no pneumothorax. IMPRESSION: Cardiomegaly. There are no signs of alveolar pulmonary edema. There are new infiltrates in both lower lung fields suggesting atelectasis/pneumonia. Part of this finding may be due to interstitial edema. Electronically Signed   By: Ernie Avena M.D.   On: 10/28/2022 18:35      IMPRESSION AND PLAN:  Assessment and Plan: * Sepsis due to pneumonia [facility]) - This is secondary to bibasal community-acquired pneumonia likely bacterial. - The patient will be admitted to a medical telemetry bed. - Sepsis manifested by leukocytosis and tachypnea. - She has subsequent generalized weakness. - Will continue antibiotic therapy with IV Levaquin. - Mucolytic therapy be provided as well as duo nebs q.i.d. and q.4 hours p.r.n. - We will follow blood  cultures. - He will be hydrated with IV lactated ringer.   Acute respiratory failure with hypoxia (HCC) - O2 protocol will be followed. - This is clearly secondary to #1. - Management otherwise as above.  Chronic obstructive pulmonary disease (COPD) (HCC) - The patient will be on scheduled and as needed DuoNebs.  Chronic diastolic CHF (congestive heart failure) (HCC) - We will continue Jardiance.  GERD without esophagitis - We will continue PPI therapy  Paroxysmal atrial fibrillation (HCC) - Continue Xarelto  Essential hypertension - We will continue his antihypertensive therapy.    DVT prophylaxis: Xarelto Advanced Care Planning:  Code Status: full code. Family Communication:  The plan of care was discussed in details with the patient (and family). I answered all questions. The patient agreed to proceed with the above mentioned plan. Further  management will depend upon hospital course. Disposition Plan: Back to previous home environment Consults called: none. All the records are reviewed and case discussed with ED provider.  Status is: Inpatient  At the time of the admission, it appears that the appropriate admission status for this patient is inpatient.  This is judged to be reasonable and necessary in order to provide the required intensity of service to ensure the patient's safety given the presenting symptoms, physical exam findings and initial radiographic and laboratory data in the context of comorbid conditions.  The patient requires inpatient status due to high intensity of service, high risk of further deterioration and high frequency of surveillance required.  I certify that at the time of admission, it is my clinical judgment that the patient will require inpatient hospital care extending more than 2 midnights.                            Dispo: The patient is from: Home              Anticipated d/c is to: Home              Patient currently is not medically  stable to d/c.              Difficult to place patient: No  Hannah Beat M.D on 10/28/2022 at 8:28 PM  [facility]   From 7 PM-7 AM, contact night-coverage [email or web address]  CC: Primary care physician; Gracelyn Nurse, MD

## 2022-10-28 NOTE — Assessment & Plan Note (Signed)
-   We will continue PPI therapy 

## 2022-10-28 NOTE — Consult Note (Signed)
PHARMACY -  BRIEF ANTIBIOTIC NOTE   Pharmacy has received consult(s) for levofloxacin dosing from an ED provider.  The patient's profile has been reviewed for ht/wt/allergies/indication/available labs.    One time order(s) placed for levofloxacin 750 mg IV  Further antibiotics/pharmacy consults should be ordered by admitting physician if indicated.                       Thank you, Barrie Folk 10/28/2022  7:08 PM

## 2022-10-28 NOTE — Assessment & Plan Note (Addendum)
-   O2 protocol will be followed. - This is clearly secondary to #1. - Management otherwise as above. 

## 2022-10-28 NOTE — Assessment & Plan Note (Addendum)
-   This is secondary to bibasal community-acquired pneumonia likely bacterial. - The patient will be admitted to a medical telemetry bed. - Sepsis manifested by leukocytosis and tachypnea. - She has subsequent generalized weakness. - Will continue antibiotic therapy with IV Levaquin. - Mucolytic therapy be provided as well as duo nebs q.i.d. and q.4 hours p.r.n. - We will follow blood cultures. - He will be hydrated with IV lactated ringer.

## 2022-10-28 NOTE — Assessment & Plan Note (Signed)
-   The patient will be on scheduled and as needed DuoNebs.

## 2022-10-28 NOTE — Assessment & Plan Note (Signed)
-   We will continue Jardiance.

## 2022-10-29 DIAGNOSIS — J189 Pneumonia, unspecified organism: Secondary | ICD-10-CM | POA: Diagnosis not present

## 2022-10-29 DIAGNOSIS — A419 Sepsis, unspecified organism: Secondary | ICD-10-CM | POA: Diagnosis not present

## 2022-10-29 LAB — PROCALCITONIN: Procalcitonin: 3.59 ng/mL

## 2022-10-29 MED ORDER — RELUGOLIX 120 MG PO TABS
120.0000 mg | ORAL_TABLET | Freq: Every day | ORAL | Status: DC
  Administered 2022-10-29 – 2022-10-31 (×3): 120 mg via ORAL
  Filled 2022-10-29 (×3): qty 1

## 2022-10-29 MED ORDER — IPRATROPIUM-ALBUTEROL 0.5-2.5 (3) MG/3ML IN SOLN
3.0000 mL | Freq: Three times a day (TID) | RESPIRATORY_TRACT | Status: DC
  Administered 2022-10-29 – 2022-10-30 (×3): 3 mL via RESPIRATORY_TRACT
  Filled 2022-10-29 (×3): qty 3

## 2022-10-29 MED ORDER — ALBUTEROL SULFATE (2.5 MG/3ML) 0.083% IN NEBU: 2.5000 mg | INHALATION_SOLUTION | RESPIRATORY_TRACT | Status: DC | PRN

## 2022-10-29 NOTE — Progress Notes (Signed)
[facility]  - [place] [facility]   PATIENT NAME: Dennis Savage    MR#:  [id number]  DATE OF BIRTH:  03-08-1930  SUBJECTIVE:   No family at bedside. Patient came in with feeling generalized weakness and some cough along with shortness of breath. Found to have community acquired pneumonia. Feeling a little better. Got some IV antibiotics. Lives at home with daughter and son-in-law. Uses a walker when he is out of the home   VITALS:  Blood pressure 117/69, pulse (!) 52, temperature 98 F (36.7 C), resp. rate 18, height 5' 9.5" (1.765 m), weight 86.2 kg, SpO2 95%.  PHYSICAL EXAMINATION:   GENERAL:  89 y.o.-year-old patient with no acute distress. obese LUNGS: decreasedbreath sounds bilaterally, no wheezing CARDIOVASCULAR: S1, S2 normal. No murmur   ABDOMEN: Soft, nontender, nondistended. Bowel sounds present.  EXTREMITIES: No  edema b/l.    NEUROLOGIC: nonfocal  patient is alert and awake SKIN: No obvious rash, lesion, or ulcer.   LABORATORY PANEL:  CBC Recent Labs  Lab 10/28/22 2214  WBC 16.7*  HGB 12.9*  HCT 38.1*  PLT 225    Chemistries  Recent Labs  Lab 10/28/22 2214  NA 133*  K 3.6  CL 98  CO2 26  GLUCOSE 157*  BUN 22  CREATININE 1.54*  CALCIUM 8.7*  AST 27  ALT 14  ALKPHOS 52  BILITOT 1.4*   Cardiac Enzymes No results for input(s): "TROPONINI" in the last 168 hours. RADIOLOGY:  DG Chest 2 View  Result Date: 10/28/2022 CLINICAL DATA:  Shortness of breath EXAM: CHEST - 2 VIEW COMPARISON:  Previous studies including the examination of 06/19/2021 FINDINGS: Transverse diameter of heart is increased. There are no signs of alveolar pulmonary edema. Increased markings are seen in both lower lung fields. Left lateral CP angle is indistinct. There is no pneumothorax. IMPRESSION: Cardiomegaly. There are no signs of alveolar pulmonary edema. There are new infiltrates in both lower lung fields suggesting atelectasis/pneumonia. Part of this finding  may be due to interstitial edema. Electronically Signed   By: Dennis Savage M.D.   On: 10/28/2022 18:35    Assessment and Plan Dennis Savage is a 89 y.o. Caucasian male with medical history significant for osteoarthritis, OSA, essential hypertension, GERD, PVD, osteoarthritis, CHF, COPD and ILD, who presented to the emergency room with acute onset of generalized weakness over the last 3 days with associated cough for the last couple of days with expectoration of whitish sputum as well as dyspnea with no wheezing.    Two-view chest x-ray showed cardiomegaly and new infiltrates in both lower lung fields suggesting atelectasis/pneumonia.   Sepsis due to CAP (HCC) - secondary to bibasal community-acquired pneumonia likely bacterial. - Sepsis manifested by leukocytosis and tachypnea. - continue antibiotic therapy with IV Levaquin. - Mucolytic therapy  as duo nebs q.i.d. and q.4 hours p.r.n. - blood cultures so far negative - received IVF -COVID negative   Acute respiratory failure with hypoxia (HCC) - O2 protocol will be followed. - This is clearly secondary to #1. - Management otherwise as above. --resolved --weaned to RA   Chronic obstructive pulmonary disease (COPD) (HCC) -on scheduled and as needed DuoNebs.   Chronic diastolic CHF (congestive heart failure) (HCC) - continue Jardiance.   GERD without esophagitis - continue PPI therapy   Paroxysmal atrial fibrillation (HCC) - Continue Xarelto   Essential hypertension -  continue his antihypertensive therapy.  Generalized weakness --pt seen by PT--recommends HHPT  DVT prophylaxis: Xarelto Advanced Care Planning:  Code Status: full code.   Family communication :dter Dennis Savage Consults :none CODE STATUS: Full DVT Prophylaxis :xarelto Level of care: Telemetry Medical Status is: Inpatient Remains inpatient appropriate because: CAP    TOTAL TIME TAKING CARE OF THIS PATIENT: 35 minutes.  >50% time spent on  counselling and coordination of care  Note: This dictation was prepared with Dragon dictation along with smaller phrase technology. Any transcriptional errors that result from this process are unintentional.  Enedina Finner M.D    [facility]   CC: Primary care physician; Gracelyn Nurse, MD

## 2022-10-29 NOTE — Evaluation (Addendum)
Physical Therapy Evaluation Patient Details Name: Dennis Savage MRN: [id number] DOB: 1929/07/15 Today's Date: 10/29/2022  History of Present Illness  Pt is a 89 y/o M who presents with weakness and a fall, diagnosed with PNA and sepsis, placed on O2 and treated with medications. PMH arthritis, COPD, CHF, HTN, GERD, a-fib, PVD  Clinical Impression  Pt is in bed, A&Ox4, reports no pain at rest but R hip pain with ambulation, pain is not new. PTA pt was modI using a SPC at home and rollator for longer distances, daughter drives him around, pt lives with daughter and son in law, pt performs self-care on own. Pt performed bed mobility, transfers, and ambulation c/ CGA and RW. Pt able to stand to clean self c/ single UE support. Pt ambulated ~67ft c/ RW and CGA cueing for AD use, navigating obstacles, SpO2 91-92 throughout ambulation. Pt left in chair, alarm set, needs within reach. Pt would benefit from acute therapy services to improve functional activity tolerance, balance, and maximize independence.       Assistance Recommended at Discharge Intermittent Supervision/Assistance  If plan is discharge home, recommend the following:  Can travel by private vehicle  A little help with walking and/or transfers;Help with stairs or ramp for entrance;Assist for transportation;Assistance with cooking/housework;Direct supervision/assist for medications management;A little help with bathing/dressing/bathroom        Equipment Recommendations None recommended by PT  Recommendations for Other Services       Functional Status Assessment Patient has had a recent decline in their functional status and demonstrates the ability to make significant improvements in function in a reasonable and predictable amount of time.     Precautions / Restrictions Precautions Precautions: Fall Restrictions Weight Bearing Restrictions: No      Mobility  Bed Mobility Overal bed mobility: Needs Assistance Bed Mobility:  Supine to Sit     Supine to sit: Min guard, HOB elevated          Transfers Overall transfer level: Needs assistance Equipment used: Rolling walker (2 wheels) Transfers: Sit to/from Stand Sit to Stand: Min guard           General transfer comment: CGA c/ RW from EOB c/ cueing for hand placement and sequencing    Ambulation/Gait Ambulation/Gait assistance: Min guard Gait Distance (Feet): 80 Feet Assistive device: Rolling walker (2 wheels) Gait Pattern/deviations: Step-through pattern, Drifts right/left       General Gait Details: decreased awareness of obstacles, cueing for AD negotiation  Stairs            Wheelchair Mobility     Tilt Bed    Modified Rankin (Stroke Patients Only)       Balance Overall balance assessment: Needs assistance Sitting-balance support: Feet supported, No upper extremity supported Sitting balance-Leahy Scale: Good     Standing balance support: Bilateral upper extremity supported, No upper extremity supported Standing balance-Leahy Scale: Fair Standing balance comment: pt reports "feeling wobbly" when not using BUE support standing at RW bedside                             Pertinent Vitals/Pain Pain Assessment Pain Assessment: Faces Faces Pain Scale: Hurts a little bit Pain Location: R hip Pain Descriptors / Indicators: Aching, Dull Pain Intervention(s): Limited activity within patient's tolerance, Monitored during session, Repositioned    Home Living Family/patient expects to be discharged to:: Private residence Living Arrangements: Children;Other (Comment) (daugther and son in law) Available Help  at Discharge: Available PRN/intermittently Type of Home: House Home Access: Stairs to enter;Ramped entrance (uses ramp)   Entrance Stairs-Number of Steps: 10   Home Layout: One level Home Equipment: Rollator (4 wheels);Cane - single point;Wheelchair - manual;Grab bars - tub/shower;Shower seat      Prior  Function Prior Level of Function : Needs assist             Mobility Comments: uses cane or rollator at baseline ADLs Comments: does not drive anymore, daughter drives him, he is able to take care of self. Daughter will sometimes cook for him     Hand Dominance        Extremity/Trunk Assessment   Upper Extremity Assessment Upper Extremity Assessment: Overall WFL for tasks assessed    Lower Extremity Assessment Lower Extremity Assessment: Overall WFL for tasks assessed       Communication   Communication: HOH  Cognition Arousal/Alertness: Awake/alert Behavior During Therapy: WFL for tasks assessed/performed Overall Cognitive Status: Within Functional Limits for tasks assessed                                 General Comments: A&Ox4        General Comments      Exercises     Assessment/Plan    PT Assessment Patient needs continued PT services  PT Problem List Decreased strength;Decreased range of motion;Decreased knowledge of use of DME;Decreased activity tolerance;Decreased safety awareness;Decreased balance;Decreased mobility;Decreased coordination       PT Treatment Interventions DME instruction;Balance training;Gait training;Neuromuscular re-education;Stair training;Functional mobility training;Patient/family education;Therapeutic activities;Therapeutic exercise    PT Goals (Current goals can be found in the Care Plan section)  Acute Rehab PT Goals Patient Stated Goal: to return to home PT Goal Formulation: With patient Time For Goal Achievement: 11/12/22 Potential to Achieve Goals: Good Additional Goals Additional Goal #1: Pt to score better than 19 on DGI (to demonstrate low falls risk / reduce falls)    Frequency Min 1X/week     Co-evaluation               AM-PAC PT "6 Clicks" Mobility  Outcome Measure Help needed turning from your back to your side while in a flat bed without using bedrails?: None Help needed moving from  lying on your back to sitting on the side of a flat bed without using bedrails?: None Help needed moving to and from a bed to a chair (including a wheelchair)?: A Little Help needed standing up from a chair using your arms (e.g., wheelchair or bedside chair)?: A Little Help needed to walk in hospital room?: A Little Help needed climbing 3-5 steps with a railing? : A Little 6 Click Score: 20    End of Session Equipment Utilized During Treatment: Gait belt Activity Tolerance: Patient tolerated treatment well Patient left: in chair;with call bell/phone within reach;with chair alarm set Nurse Communication: Mobility status PT Visit Diagnosis: Other abnormalities of gait and mobility (R26.89);Unsteadiness on feet (R26.81);History of falling (Z91.81)    Time: 8295-6213 PT Time Calculation (min) (ACUTE ONLY): 24 min   Charges:   PT Evaluation $PT Eval Low Complexity: 1 Low PT Treatments $Therapeutic Activity: 8-22 mins PT General Charges $$ ACUTE PT VISIT: 1 Visit        Lala Lund, PT, SPT  12:39 PM,10/29/22

## 2022-10-30 DIAGNOSIS — A419 Sepsis, unspecified organism: Secondary | ICD-10-CM | POA: Diagnosis not present

## 2022-10-30 DIAGNOSIS — J189 Pneumonia, unspecified organism: Secondary | ICD-10-CM | POA: Diagnosis not present

## 2022-10-30 MED ORDER — LEVOFLOXACIN IN D5W 750 MG/150ML IV SOLN
750.0000 mg | INTRAVENOUS | Status: DC
  Filled 2022-10-30: qty 150

## 2022-10-30 MED ORDER — UMECLIDINIUM BROMIDE 62.5 MCG/ACT IN AEPB
1.0000 | INHALATION_SPRAY | Freq: Every day | RESPIRATORY_TRACT | Status: DC
  Administered 2022-10-30 – 2022-10-31 (×2): 1 via RESPIRATORY_TRACT
  Filled 2022-10-30: qty 7

## 2022-10-30 MED ORDER — FUROSEMIDE 10 MG/ML IJ SOLN
20.0000 mg | Freq: Once | INTRAMUSCULAR | Status: AC
  Administered 2022-10-30: 20 mg via INTRAVENOUS
  Filled 2022-10-30: qty 2

## 2022-10-30 MED ORDER — FLUTICASONE FUROATE-VILANTEROL 100-25 MCG/ACT IN AEPB
1.0000 | INHALATION_SPRAY | Freq: Every day | RESPIRATORY_TRACT | Status: DC
  Administered 2022-10-30 – 2022-10-31 (×2): 1 via RESPIRATORY_TRACT
  Filled 2022-10-30: qty 28

## 2022-10-30 MED ORDER — LEVOFLOXACIN 750 MG PO TABS
750.0000 mg | ORAL_TABLET | ORAL | Status: DC
  Administered 2022-10-30: 750 mg via ORAL
  Filled 2022-10-30: qty 1

## 2022-10-30 NOTE — Progress Notes (Signed)
[facility]  - [place] [facility]   PATIENT NAME: Dennis Savage    MR#:  [id number]  DATE OF BIRTH:  26-Oct-1929  SUBJECTIVE:   No family at bedside. Patient came in with feeling generalized weakness and some cough along with shortness of breath. Found to have community acquired pneumonia. Feeling a little better. Got some IV antibiotics. Lives at home with daughter and son-in-law. Uses a walker when he is out of the home Cough improving Weak. Eating better   VITALS:  Blood pressure (!) 143/69, pulse 65, temperature 98.3 F (36.8 C), temperature source Oral, resp. rate 18, height 5' 9.5" (1.765 m), weight 86.2 kg, SpO2 94%.  PHYSICAL EXAMINATION:   GENERAL:  89 y.o.-year-old patient with no acute distress. obese LUNGS: decreasedbreath sounds bilaterally, no wheezing, few bibasilar rales CARDIOVASCULAR: S1, S2 normal. No murmur   ABDOMEN: Soft, nontender, nondistended. Bowel sounds present.  EXTREMITIES: No  edema b/l.    NEUROLOGIC: nonfocal  patient is alert and awake SKIN: No obvious rash, lesion, or ulcer.   LABORATORY PANEL:  CBC Recent Labs  Lab 10/28/22 2214  WBC 16.7*  HGB 12.9*  HCT 38.1*  PLT 225    Chemistries  Recent Labs  Lab 10/28/22 2214  NA 133*  K 3.6  CL 98  CO2 26  GLUCOSE 157*  BUN 22  CREATININE 1.54*  CALCIUM 8.7*  AST 27  ALT 14  ALKPHOS 52  BILITOT 1.4*   Cardiac Enzymes No results for input(s): "TROPONINI" in the last 168 hours. RADIOLOGY:  DG Chest 2 View  Result Date: 10/28/2022 CLINICAL DATA:  Shortness of breath EXAM: CHEST - 2 VIEW COMPARISON:  Previous studies including the examination of 06/19/2021 FINDINGS: Transverse diameter of heart is increased. There are no signs of alveolar pulmonary edema. Increased markings are seen in both lower lung fields. Left lateral CP angle is indistinct. There is no pneumothorax. IMPRESSION: Cardiomegaly. There are no signs of alveolar pulmonary edema. There are new  infiltrates in both lower lung fields suggesting atelectasis/pneumonia. Part of this finding may be due to interstitial edema. Electronically Signed   By: Ernie Avena M.D.   On: 10/28/2022 18:35    Assessment and Plan Dennis Savage is a 89 y.o. Caucasian male with medical history significant for osteoarthritis, OSA, essential hypertension, GERD, PVD, osteoarthritis, CHF, COPD and ILD, who presented to the emergency room with acute onset of generalized weakness over the last 3 days with associated cough for the last couple of days with expectoration of whitish sputum as well as dyspnea with no wheezing.    Two-view chest x-ray showed cardiomegaly and new infiltrates in both lower lung fields suggesting atelectasis/pneumonia.   Sepsis due to CAP (HCC) - secondary to bibasal community-acquired pneumonia likely bacterial. - Sepsis manifested by leukocytosis and tachypnea. - continue antibiotic therapy with IV Levaquin. - Mucolytic therapy  as duo nebs q.i.d. and q.4 hours p.r.n. - blood cultures so far negative - received IVF -COVID negative --wbc 16 K--check CBC in am   Acute respiratory failure with hypoxia (HCC) - O2 protocol will be followed. - This is clearly secondary to #1. - Management otherwise as above. --resolved --weaned to RA   Chronic obstructive pulmonary disease (COPD) (HCC) -on scheduled and as needed DuoNebs.   Chronic diastolic CHF (congestive heart failure) (HCC) - continue Jardiance. --few rales--will give Lasix 20 mg x 1 --sats >94% on RA   GERD without esophagitis - continue PPI therapy   Paroxysmal  atrial fibrillation (HCC) - Continue Xarelto   Essential hypertension -  continue his antihypertensive therapy.  Generalized weakness --pt seen by PT--recommends HHPT       DVT prophylaxis: Xarelto Advanced Care Planning:  Code Status: full code.   Family communication :dter [name] Consults :none CODE STATUS: Full DVT Prophylaxis  :xarelto Level of care: Telemetry Medical Status is: Inpatient Remains inpatient appropriate because: CAP. EDD likely in am    TOTAL TIME TAKING CARE OF THIS PATIENT: 35 minutes.  >50% time spent on counselling and coordination of care  Note: This dictation was prepared with Dragon dictation along with smaller phrase technology. Any transcriptional errors that result from this process are unintentional.  Enedina Finner M.D    [facility]   CC: Primary care physician; Gracelyn Nurse, MD

## 2022-10-30 NOTE — Progress Notes (Signed)
PT Cancellation Note  Patient Details Name: REYNOLDO MAINER MRN: [id number] DOB: 05/16/1929   Cancelled Treatment:     Pt had just returned to bed stating he has been up ambulating to/from bathroom with RW independently all day and sat up in chair for several hours. Pt awaiting d/c home tomorrow with family, RW to be delivered to home. Will continue acutely if d/c delayed.   Jannet Askew 10/30/2022, 3:08 PM

## 2022-10-30 NOTE — TOC Initial Note (Addendum)
Transition of Care [facility]) - Initial/Assessment Note    Patient Details  Name: Dennis Savage MRN: [id number] Date of Birth: 03/04/1930  [facility]) CM/SW Contact:    [name], LCSW Phone Number: 10/30/2022, 2:13 PM  Clinical Narrative:        CSW spoke with pt at bedside. Pt lives with his daughter and son in law and their children. Pt reports he is active with dr Laural Benes for primary care. Pt states he normally walks with a cane but would be interested in getting a RW. Pt is also agreeable to [facility] and reports he has never had it before and has no preference. Pt reports his daughter drives him to appts and he has no other discharge needs currently. Csw to order RW and HH.          Referral accepted by Adelina Mings with [facility] for PT/OT/RN  Expected Discharge Plan: Home w Home Health Services Barriers to Discharge: Continued Medical Work up   Patient Goals and CMS Choice Patient states their goals for this hospitalization and ongoing recovery are:: return home with hh CMS Medicare.gov Compare Post Acute Care list provided to:: Patient        Expected Discharge Plan and Services     Post Acute Care Choice: Durable Medical Equipment, Home Health Living arrangements for the past 2 months: Single Family Home                   DME Agency: [employer] Date DME Agency Contacted: 10/30/22                Prior Living Arrangements/Services Living arrangements for the past 2 months: Single Family Home Lives with:: Adult Children                   Activities of Daily Living Home Assistive Devices/Equipment: Cane (specify quad or straight), Dentures (specify type), Eyeglasses, Hearing aid ADL Screening (condition at time of admission) Patient's cognitive ability adequate to safely complete daily activities?: Yes Is the patient deaf or have difficulty hearing?: Yes Does the patient have difficulty seeing, even when wearing glasses/contacts?: Yes Does the  patient have difficulty concentrating, remembering, or making decisions?: No Patient able to express need for assistance with ADLs?: Yes Does the patient have difficulty dressing or bathing?: No Independently performs ADLs?: Yes (appropriate for developmental age) Does the patient have difficulty walking or climbing stairs?: Yes Weakness of Legs: Both Weakness of Arms/Hands: None  Permission Sought/Granted         Permission granted to share info w AGENCY: HH        Emotional Assessment     Affect (typically observed): Accepting Orientation: : Oriented to Self, Oriented to Place, Oriented to  Time, Oriented to Situation      Admission diagnosis:  Weakness [R53.1] Acute respiratory failure with hypoxia (HCC) [J96.01] Sepsis due to pneumonia (HCC) [J18.9, A41.9] Community acquired pneumonia, unspecified laterality [J18.9] Patient Active Problem List   Diagnosis Date Noted   Sepsis due to pneumonia (HCC) 10/28/2022   Essential hypertension 10/28/2022   Paroxysmal atrial fibrillation (HCC) 10/28/2022   GERD without esophagitis 10/28/2022   Chronic diastolic CHF (congestive heart failure) (HCC) 10/28/2022   Chronic obstructive pulmonary disease (COPD) (HCC) 10/28/2022   Acute respiratory failure with hypoxia (HCC) 10/28/2022   Weakness 10/28/2022   Obesity (BMI 30-39.9) 06/21/2021   Pleural effusion on left 06/19/2021   CAP (community acquired pneumonia) 06/18/2021   Acute on chronic diastolic CHF (congestive  heart failure) (HCC) 06/18/2021   Adrenal mass, left (HCC) 06/18/2021   Elevated troponin 06/18/2021   CHF (congestive heart failure) (HCC) 06/17/2021   Acute kidney injury superimposed on CKD (HCC) 06/17/2021   Acute CHF (congestive heart failure) (HCC) 06/17/2021   S/P total hip arthroplasty 10/12/2015   Degenerative arthritis of hip 09/12/2015   Atrial fibrillation (HCC) 07/13/2015   Apnea, sleep 07/13/2015   Acute diarrhea 02/25/2015   Dizziness 02/25/2015    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, 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   Retinal hemorrhage 03/16/2014   Interstitial lung disease (HCC) 10/13/2013   After cataract not obscuring vision 12/21/2011   History of surgical procedure 12/21/2011   CN (constipation) 06/15/2011   Encounter for general adult medical examination without abnormal findings 06/15/2011   Arthralgia of multiple joints 06/15/2011   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   Artificial lens present 12/08/2010   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   Benign essential HTN 11/03/2010   COPD with exacerbation (HCC) 11/03/2010   Benign hypertension 11/03/2010   LBP (low back pain) 11/03/2010   Peripheral vascular disease (HCC) 11/03/2010   Current tobacco use 11/03/2010   PCP:  Gracelyn Nurse, MD Pharmacy:   [facility] [address] ([address]), [place] - [address] [place] ([place]) [place] [address] Phone: [phone] Fax: [phone]  [facility] [place], [place] - [address] [place] [address] Phone: [phone] Fax: [phone]  [facility] [place], [place] - [address] [address] [phone] [facility] [address] [place] [address] Phone: [phone] Fax: [phone]     Social Determinants of Health (SDOH) Social History: SDOH Screenings   Food Insecurity: No Food Insecurity (10/28/2022)  Housing: Low Risk  (10/28/2022)  Transportation Needs: Unknown (10/28/2022)  Utilities: Not At Risk (10/28/2022)  Depression (PHQ2-9): Low Risk  (06/30/2021)  Tobacco Use: Medium Risk (10/28/2022)   SDOH Interventions:     Readmission Risk Interventions     No data to display

## 2022-10-30 NOTE — Consult Note (Signed)
PHARMACY - PHYSICIAN COMMUNICATION CRITICAL VALUE ALERT - BLOOD CULTURE IDENTIFICATION (BCID)  Dennis Savage is an 89 y.o. male who presented to [facility] on 10/28/2022 with a chief complaint of Fall and Weakness   Assessment:  1 out of 4 bottles growing GPR. Possibly contaminant.   Name of physician (or Provider) ContactedAllena Katz  Current antibiotics: Levofloxacin  Changes to prescribed antibiotics recommended: No change.  Recommendations accepted by provider  No results found for this or any previous visit.  [name] 10/30/2022  6:51 AM

## 2022-10-31 DIAGNOSIS — J9601 Acute respiratory failure with hypoxia: Secondary | ICD-10-CM | POA: Diagnosis not present

## 2022-10-31 DIAGNOSIS — J189 Pneumonia, unspecified organism: Secondary | ICD-10-CM | POA: Diagnosis not present

## 2022-10-31 DIAGNOSIS — R531 Weakness: Secondary | ICD-10-CM | POA: Diagnosis not present

## 2022-10-31 DIAGNOSIS — A419 Sepsis, unspecified organism: Secondary | ICD-10-CM | POA: Diagnosis not present

## 2022-10-31 LAB — CBC
HCT: 38.5 % — ABNORMAL LOW (ref 39.0–52.0)
Hemoglobin: 12.7 g/dL — ABNORMAL LOW (ref 13.0–17.0)
MCH: 29.5 pg (ref 26.0–34.0)
MCHC: 33 g/dL (ref 30.0–36.0)
MCV: 89.5 fL (ref 80.0–100.0)
Platelets: 245 10*3/uL (ref 150–400)
RBC: 4.3 MIL/uL (ref 4.22–5.81)
RDW: 13.4 % (ref 11.5–15.5)
WBC: 7.2 10*3/uL (ref 4.0–10.5)
nRBC: 0 % (ref 0.0–0.2)

## 2022-10-31 MED ORDER — LEVOFLOXACIN 750 MG PO TABS: 750.0000 mg | ORAL_TABLET | Freq: Every day | ORAL | 0 refills | Status: AC

## 2022-10-31 MED ORDER — RIVAROXABAN 15 MG PO TABS: 15.0000 mg | ORAL_TABLET | Freq: Every day | ORAL | 0 refills | Status: DC

## 2022-10-31 NOTE — TOC Transition Note (Signed)
Transition of Care [facility]) - CM/SW Discharge Note   Patient Details  Name: Dennis Savage MRN: [id number] Date of Birth: [birth year]  Transition of Care [facility]) CM/SW Contact:  [name], LCSW Phone Number: 10/31/2022, 10:01 AM   Clinical Narrative:  Pt has orders to discharge home with [facility]. Adelina Mings with [facility] notified. [employer] has delivered a RW to bedside for patient. CSW signing off.     Final next level of care: Home w Home Health Services Barriers to Discharge: Barriers Resolved   Patient Goals and CMS Choice CMS Medicare.gov Compare Post Acute Care list provided to:: Patient Choice offered to / list presented to : Patient  Discharge Placement                         Discharge Plan and Services Additional resources added to the After Visit Summary for       Post Acute Care Choice: Durable Medical Equipment, Home Health          DME Arranged: Dan Humphreys rolling DME Agency: [employer] Date DME Agency Contacted: 10/30/22   Representative spoke with at DME Agency: Vilinda Blanks HH Arranged: PT, OT, RN HH Agency: [facility] Date HH Agency Contacted: 10/30/22 Time HH Agency Contacted: 1100 Representative spoke with at [facility] Agency: Adelina Mings  Social Determinants of Health (SDOH) Interventions SDOH Screenings   Food Insecurity: No Food Insecurity (10/28/2022)  Housing: Low Risk  (10/28/2022)  Transportation Needs: Unknown (10/28/2022)  Utilities: Not At Risk (10/28/2022)  Depression (PHQ2-9): Low Risk  (06/30/2021)  Tobacco Use: Medium Risk (10/28/2022)     Readmission Risk Interventions     No data to display

## 2022-10-31 NOTE — Discharge Instructions (Addendum)
Please make a follow up appointment with his primary doctor in about a week to discuss his hospital stay. I recommend rechecking his kidney function and discuss his xarelto dose. Discharged on 15mg  daily. Previous dose was 20mg  daily

## 2022-10-31 NOTE — Care Management Important Message (Signed)
Important Message  Patient Details  Name: Dennis Savage MRN: [id number] Date of Birth: 05/11/1929   Medicare Important Message Given:  N/A - LOS <3 / Initial given by admissions     [name] 10/31/2022, 8:01 AM

## 2022-10-31 NOTE — Discharge Summary (Signed)
Physician Discharge Summary  Patient: Dennis Savage OAC:[id number] DOB: 1929/06/03   Code Status: Full Code Admit date: 10/28/2022 Discharge date: 10/31/2022 Disposition: Home, PT PCP: Gracelyn Nurse, MD  Recommendations for Outpatient Follow-up:  Follow up with PCP within 1-2 weeks Regarding general hospital follow up and preventative care  Discharge Diagnoses:  Principal Problem:   Sepsis due to pneumonia [facility]) Active Problems:   Acute respiratory failure with hypoxia (HCC)   Essential hypertension   Paroxysmal atrial fibrillation (HCC)   GERD without esophagitis   Chronic diastolic CHF (congestive heart failure) (HCC)   Chronic obstructive pulmonary disease (COPD) (HCC)   Weakness  Brief Hospital Course Summary: Dennis Savage is a 89 y.o. male with medical history significant for osteoarthritis, OSA, essential hypertension, GERD, PVD, osteoarthritis, CHF, COPD and ILD. They presented to the emergency room with acute onset of generalized weakness over the last 3 days with associated cough for the last couple of days with expectoration of whitish sputum as well as dyspnea with no wheezing.   ED Course: Upon presentation to the emergency room BP was 159/71 with a heart rate of 24 and pulse currently 87% on room air and temp 99.6.  Labs revealed hyponatremia of 132, glucose of 231, creatinine of 1.56 and calcium 8.8.  High sensitive troponin I was 65 and lactic acid 2.6.  CBC showed leukocytosis of 13.2.  UA showed more than 500 glucose 100 protein rare bacteria and 11-20 RBCs with 0-5 WBCs. EKG: right bundle branch block with a rate of 89 and a likely normal sinus rhythm with RSR in V1 and Q waves inferiorly with no acute findings. Imaging: Two-view chest x-ray showed cardiomegaly and new infiltrates in both lower lung fields suggesting atelectasis/pneumonia.  Interstitial edema would be in the differential diagnosis.   The patient was given IV Levaquin and supportive care for  sepsis 2/2 CAP. He progressed very well and was able to wean to room air for >24 hours prior to dc. His antibiotics were transitioned to PO.  He was evaluated by PT who recommended home health PT.  His home medications were reviewed with him and his daughter and we decided to discontinue docusate and empagliflozin. His xarelto was prescribed at a lower dose temporarily and can be increased again once kidney function recovers if deemed appropriate by PCP.  He was prescribed a complete course of PO levofloxacin.   All other chronic conditions were treated with home medications.   Discharge Condition: Good, improved Recommended discharge diet: Regular healthy diet  Consultations: None   Procedures/Studies: None  Allergies as of 10/31/2022       Reactions   Lisinopril Cough   Penicillins Hives, Swelling   Has patient had a PCN reaction causing immediate rash, facial/tongue/throat swelling, SOB or lightheadedness with hypotension: yES Has patient had a PCN reaction causing severe rash involving mucus membranes or skin necrosis: UNKNOWN Has patient had a PCN reaction that required hospitalization NO Has patient had a PCN reaction occurring within the last 10 years: nO If all of the above answers are "NO", then may proceed with Cephalosporin use.        Medication List     STOP taking these medications    docusate sodium 250 MG capsule Commonly known as: COLACE   empagliflozin 10 MG Tabs tablet Commonly known as: JARDIANCE       TAKE these medications    acetaminophen 325 MG tablet Commonly known as: TYLENOL Take 650 mg by  mouth every 6 (six) hours as needed.   amLODipine 10 MG tablet Commonly known as: NORVASC Take 10 mg by mouth daily.   CALCIUM-VITAMIN D PO Take by mouth.   chlorthalidone 25 MG tablet Commonly known as: HYGROTON Take 25 mg by mouth See admin instructions. Patient takes only on Sunday and Wednesday   Fish Oil Omega-3 1000 MG Caps Take 1,000 mg  by mouth daily.   fluticasone 50 MCG/ACT nasal spray Commonly known as: FLONASE Place 1 spray into both nostrils daily.   levalbuterol 45 MCG/ACT inhaler Commonly known as: XOPENEX HFA Inhale 2 puffs into the lungs every 4 (four) hours as needed for wheezing.   levofloxacin 750 MG tablet Commonly known as: LEVAQUIN Take 1 tablet (750 mg total) by mouth daily for 3 days.   omeprazole 20 MG capsule Commonly known as: PRILOSEC Take 20 mg by mouth daily.   Orgovyx 120 MG tablet Generic drug: relugolix TAKE 1 TABLET BY MOUTH ONCE DAILY. What changed: how much to take   PRESERVISION AREDS 2 PO Take by mouth.   Trelegy Ellipta 100-62.5-25 MCG/ACT Aepb Generic drug: Fluticasone-Umeclidin-Vilant Inhale 1 Inhalation into the lungs at bedtime.   Xarelto 20 MG Tabs tablet Generic drug: rivaroxaban Take 1 tablet by mouth daily with supper. What changed: Another medication with the same name was added. Make sure you understand how and when to take each.   Rivaroxaban 15 MG Tabs tablet Commonly known as: XARELTO Take 1 tablet (15 mg total) by mouth daily with supper. What changed: You were already taking a medication with the same name, and this prescription was added. Make sure you understand how and when to take each.               Durable Medical Equipment  (From admission, onward)           Start     Ordered   10/30/22 1425  For home use only DME Walker rolling  Once       Question Answer Comment  Walker: With 5 Inch Wheels   Patient needs a walker to treat with the following condition Pneumonia      07 /16/24 1424            Subjective   Pt reports feeling well. He has been ambulating independently around room and to the bathroom without SOB. He denies dyspnea  or chest pain and feels ready to go home.   All questions and concerns were addressed at time of discharge.  Objective  Blood pressure (!) 149/71, pulse (!) 52, temperature 98.6 F (37 C), resp.  rate 16, height 5' 9.5" (1.765 m), weight 86.2 kg, SpO2 96%.   General: Pt is alert, awake, not in acute distress Cardiovascular: RRR, S1/S2 +, no rubs, no gallops Respiratory: CTA bilaterally, no wheezing, no rhonchi Abdominal: Soft, NT, ND, bowel sounds + Extremities: no edema, no cyanosis  The results of significant diagnostics from this hospitalization (including imaging, microbiology, ancillary and laboratory) are listed below for reference.   Imaging studies: DG Chest 2 View  Result Date: 10/28/2022 CLINICAL DATA:  Shortness of breath EXAM: CHEST - 2 VIEW COMPARISON:  Previous studies including the examination of 06/19/2021 FINDINGS: Transverse diameter of heart is increased. There are no signs of alveolar pulmonary edema. Increased markings are seen in both lower lung fields. Left lateral CP angle is indistinct. There is no pneumothorax. IMPRESSION: Cardiomegaly. There are no signs of alveolar pulmonary edema. There are new infiltrates in both lower  lung fields suggesting atelectasis/pneumonia. Part of this finding may be due to interstitial edema. Electronically Signed   By: Ernie Avena M.D.   On: 10/28/2022 18:35   MR HIP LEFT WO CONTRAST  Result Date: 10/16/2022 CLINICAL DATA:  Left hip pain. EXAM: MR OF THE LEFT HIP WITHOUT CONTRAST TECHNIQUE: Multiplanar, multisequence MR imaging was performed. No intravenous contrast was administered. COMPARISON:  None Available. FINDINGS: Bone No left hip fracture, dislocation or avascular necrosis. No aggressive osseous lesion. Status post right hip arthroplasty with susceptibility artifact. SI joints are normal. No SI joint widening or erosive changes. Lower lumbar spine demonstrates no focal abnormality. Alignment Normal. No subluxation. Dysplasia None. Joint effusion No joint effusions. Labrum Marked degenerative changes of the labrum with degeneration and diminution. Cartilage Near complete loss of the articular cartilage with subchondral  edema of the femoral head Capsule and ligaments Normal. Muscles and Tendons Flexors: Normal. Extensors: Normal. Abductors: Normal. Adductors: Normal. Rotators: Normal. Hamstrings: Normal. Other Findings No bursal fluid. Viscera No abnormality seen in pelvis. No lymphadenopathy. No free fluid in the pelvis. IMPRESSION: 1. No evidence of fracture or osteonecrosis. 2. Moderate left hip osteoarthritis with advanced degenerative changes of the labrum and near complete loss of articular cartilage with subchondral edema of the femoral head. No significant joint effusion. 3. Muscles and tendons are unremarkable. No evidence of bursitis. 4. Status post right hip arthroplasty with susceptibility artifact. Electronically Signed   By: Larose Hires D.O.   On: 10/16/2022 22:08   MR LUMBAR SPINE WO CONTRAST  Result Date: 10/16/2022 CLINICAL DATA:  Progressively worsening pain EXAM: MRI LUMBAR SPINE WITHOUT CONTRAST TECHNIQUE: Multiplanar, multisequence MR imaging of the lumbar spine was performed. No intravenous contrast was administered. COMPARISON:  None FINDINGS: Segmentation:  Standard. Alignment:  Trace retrolisthesis of L2 on L3. Vertebrae:  No fracture, evidence of discitis, or bone lesion. Conus medullaris and cauda equina: Conus extends to the L1 level. Conus and cauda equina appear normal. Paraspinal and other soft tissues: Multiple T2 hyperintense renal lesions are compatible large renal cysts. Disc levels: T12-L1: Mild bilateral facet degenerative change. No spinal canal or neural foraminal stenosis. L1-L2: Moderate bilateral facet degenerative change. Ligamentum flavum hypertrophy. Minimal disc bulge. Mild spinal canal narrowing. No significant neural foraminal narrowing. L2-L3: Moderate to severe bilateral facet degenerative change. There is a 7 x 5 mm intraspinal synovial cyst on the left. Ligamentum flavum hypertrophy. Severe spinal canal narrowing. Moderate right and mild left neural foraminal narrowing. L3-L4:  Moderate to severe bilateral facet degenerative change. Posterior decompression. Mild spinal canal narrowing. Moderate to severe bilateral neural foraminal narrowing. L4-L5: Severe bilateral facet degenerative change. Posterior decompression. Mild spinal canal narrowing. Severe right and moderate left neural foraminal narrowing L5-S1: Severe bilateral facet degenerative change. Moderate spinal canal narrowing. Severe bilateral neural foraminal narrowing. IMPRESSION: 1. Severe spinal canal narrowing at L2-L3 due to a combination of an intraspinal synovial cyst on the left, short pedicles, and disc bulge. Degenerative changes at the L2-L3 level could localize to the hip/groin region. 2. Moderate spinal canal narrowing at L5-S1. 3. Multilevel neural foraminal narrowing, severe on the right at L4-L5 and bilaterally at L5-S1. Electronically Signed   By: Lorenza Cambridge M.D.   On: 10/16/2022 20:22    Labs: Basic Metabolic Panel: Recent Labs  Lab 10/28/22 1741 10/28/22 2214  NA 132* 133*  K 4.0 3.6  CL 99 98  CO2 22 26  GLUCOSE 231* 157*  BUN 22 22  CREATININE 1.56* 1.54*  CALCIUM 8.8*  8.7*   CBC: Recent Labs  Lab 10/28/22 1741 10/28/22 2214 10/31/22 0513  WBC 13.2* 16.7* 7.2  NEUTROABS  --  13.4*  --   HGB 14.1 12.9* 12.7*  HCT 43.4 38.1* 38.5*  MCV 91.4 87.8 89.5  PLT 221 225 245   Microbiology: Results for orders placed or performed during the hospital encounter of 10/28/22  Resp panel by RT-PCR (RSV, Flu A&B, Covid) Anterior Nasal Swab     Status: None   Collection Time: 10/28/22  7:06 PM   Specimen: Anterior Nasal Swab  Result Value Ref Range Status   SARS Coronavirus 2 by RT PCR NEGATIVE NEGATIVE Final    Comment: (NOTE) SARS-CoV-2 target nucleic acids are NOT DETECTED.  The SARS-CoV-2 RNA is generally detectable in upper respiratory specimens during the acute phase of infection. The lowest concentration of SARS-CoV-2 viral copies this assay can detect is 138 copies/mL. A  negative result does not preclude SARS-Cov-2 infection and should not be used as the sole basis for treatment or other patient management decisions. A negative result may occur with  improper specimen collection/handling, submission of specimen other than nasopharyngeal swab, presence of viral mutation(s) within the areas targeted by this assay, and inadequate number of viral copies(<138 copies/mL). A negative result must be combined with clinical observations, patient history, and epidemiological information. The expected result is Negative.  Fact Sheet for Patients:  BloggerCourse.com  Fact Sheet for Healthcare Providers:  SeriousBroker.it  This test is no t yet approved or cleared by the [place] FDA and  has been authorized for detection and/or diagnosis of SARS-CoV-2 by FDA under an Emergency Use Authorization (EUA). This EUA will remain  in effect (meaning this test can be used) for the duration of the COVID-19 declaration under Section 564(b)(1) of the Act, 21 U.S.C.section 360bbb-3(b)(1), unless the authorization is terminated  or revoked sooner.       Influenza A by PCR NEGATIVE NEGATIVE Final   Influenza B by PCR NEGATIVE NEGATIVE Final    Comment: (NOTE) The Xpert Xpress SARS-CoV-2/FLU/RSV plus assay is intended as an aid in the diagnosis of influenza from Nasopharyngeal swab specimens and should not be used as a sole basis for treatment. Nasal washings and aspirates are unacceptable for Xpert Xpress SARS-CoV-2/FLU/RSV testing.  Fact Sheet for Patients: BloggerCourse.com  Fact Sheet for Healthcare Providers: SeriousBroker.it  This test is not yet approved or cleared by the [place] FDA and has been authorized for detection and/or diagnosis of SARS-CoV-2 by FDA under an Emergency Use Authorization (EUA). This EUA will remain in effect (meaning this test can  be used) for the duration of the COVID-19 declaration under Section 564(b)(1) of the Act, 21 U.S.C. section 360bbb-3(b)(1), unless the authorization is terminated or revoked.     Resp Syncytial Virus by PCR NEGATIVE NEGATIVE Final    Comment: (NOTE) Fact Sheet for Patients: BloggerCourse.com  Fact Sheet for Healthcare Providers: SeriousBroker.it  This test is not yet approved or cleared by the [place] FDA and has been authorized for detection and/or diagnosis of SARS-CoV-2 by FDA under an Emergency Use Authorization (EUA). This EUA will remain in effect (meaning this test can be used) for the duration of the COVID-19 declaration under Section 564(b)(1) of the Act, 21 U.S.C. section 360bbb-3(b)(1), unless the authorization is terminated or revoked.  Performed at [facility], [address], [place], [place] [address]   Blood Culture (routine x 2)     Status: Abnormal   Collection Time: 10/28/22  7:37 PM   Specimen: BLOOD  Result Value Ref Range Status   Specimen Description   Final    BLOOD BLOOD RIGHT ARM Performed at [facility], [address] [place], [place] [address]    Special Requests   Final    BOTTLES DRAWN AEROBIC AND ANAEROBIC Blood Culture adequate volume Performed at [facility], [address] [place], [place] [address]    Culture  Setup Time   Final    GRAM POSITIVE RODS AEROBIC BOTTLE ONLY CRITICAL RESULT CALLED TO, READ BACK BY AND VERIFIED WITH: Kevin Fenton PATEL 10/30/22 0981 KLW Performed at [facility], [address] [place], [place] [address]    Culture (A)  Final    CORYNEBACTERIUM STRIATUM Standardized susceptibility testing for this organism is not available. Performed at [facility], [address] [place], [place] [address]    Report Status 11/01/2022 FINAL  Final  Blood Culture (routine x 2)     Status: None   Collection Time: 10/28/22   7:37 PM   Specimen: BLOOD  Result Value Ref Range Status   Specimen Description BLOOD BLOOD LEFT ARM  Final   Special Requests   Final    BOTTLES DRAWN AEROBIC AND ANAEROBIC Blood Culture adequate volume   Culture   Final    NO GROWTH 5 DAYS Performed at [facility], [address] [place], [place] [address]    Report Status 11/02/2022 FINAL  Final  Culture, blood (x 2)     Status: None   Collection Time: 10/28/22 10:14 PM   Specimen: BLOOD LEFT HAND  Result Value Ref Range Status   Specimen Description BLOOD LEFT HAND  Final   Special Requests   Final    BOTTLES DRAWN AEROBIC AND ANAEROBIC Blood Culture adequate volume   Culture   Final    NO GROWTH 5 DAYS Performed at [facility], [address], [place], [place] [address]    Report Status 11/02/2022 FINAL  Final   Time coordinating discharge: Over 30 minutes  Leeroy Bock, MD  [facility] 10/31/2022, 9:53 AM

## 2022-11-01 LAB — CULTURE, BLOOD (ROUTINE X 2): Special Requests: ADEQUATE

## 2022-11-02 LAB — CULTURE, BLOOD (ROUTINE X 2)
Culture: NO GROWTH
Culture: NO GROWTH
Special Requests: ADEQUATE
Special Requests: ADEQUATE

## 2022-12-21 ENCOUNTER — Other Ambulatory Visit
Admission: RE | Admit: 2022-12-21 | Discharge: 2022-12-21 | Disposition: A | Discharge status: Home / Self Care | Payer: Medicare Other | Attending: Urology | Admitting: Urology

## 2022-12-21 ENCOUNTER — Ambulatory Visit (INDEPENDENT_AMBULATORY_CARE_PROVIDER_SITE_OTHER): Payer: Medicare Other | Admitting: Urology

## 2022-12-21 ENCOUNTER — Encounter: Payer: Self-pay | Admitting: Urology

## 2022-12-21 VITALS — BP 121/68 | HR 86 | Ht 69.0 in | Wt 196.6 lb

## 2022-12-21 DIAGNOSIS — R350 Frequency of micturition: Secondary | ICD-10-CM | POA: Diagnosis present

## 2022-12-21 DIAGNOSIS — N401 Enlarged prostate with lower urinary tract symptoms: Secondary | ICD-10-CM | POA: Diagnosis present

## 2022-12-21 DIAGNOSIS — C61 Malignant neoplasm of prostate: Secondary | ICD-10-CM | POA: Diagnosis not present

## 2022-12-21 LAB — PSA: Prostatic Specific Antigen: 0.62 ng/mL (ref 0.00–4.00)

## 2022-12-21 NOTE — Progress Notes (Signed)
Marcelle Overlie Plume,acting as a scribe for Vanna Scotland, MD.,have documented all relevant documentation on the behalf of Vanna Scotland, MD,as directed by  Vanna Scotland, MD while in the presence of Vanna Scotland, MD.  12/21/2022 10:56 AM   Dennis Savage 06-06-1929 [id number]  Referring provider: Gracelyn Nurse, MD [address] [place],  [place] [address]  Chief Complaint  Patient presents with   Benign Prostatic Hypertrophy    HPI: 89 year-old male with a personal history of prostate cancer who returns today for a 6 month follow up.  He presented in 02/2022 with worsening urinary symptoms, insensation, and incomplete bladder emptying. He had a PSA that was abnormal at 41.7 on 01/17/2022. He had a PSMA PET scan which demonstrated avid nodal mets at the right iliac, including common iliac, external and internal iliacs along with possible cirrhosal implant near the border of his rectum. He met the criteria for a clinical diagnosis of prostate cancer. We elected to defer biopsy. He elected to be managed with palliative ADT in the form of Orgovyx. He was also on Flomax and Gemtesa for his urinary symptoms but is no longer taking these medications.   His most recent PSA was drawn today and is still pending. Creatinine is stable at 1.5.    He was admitted in 10/2022 for several days with community acquired pneumonia.  Today, he reports improvement in his urinary symptoms, although he describes a pattern where he urinates four times with pauses in between, each subsequent urination being shorter than the previous. He notes that if he does not wait for the final few drops, he has to start the process over again. He typically spends about 15 minutes in the bathroom. He also mentions that he now only gets up three times a night to urinate, which is an improvement.  He is scheduled for hip surgery on the 26th due to significant pain from bone-on-bone contact.   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   Dizziness 02/25/2015   GERD (gastroesophageal reflux disease)    Heart murmur    Hypertension    Interstitial lung disease (HCC) 10/13/2013   LBP (low back pain) 11/03/2010   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   Peripheral vascular disease (HCC) 11/03/2010   Retinal hemorrhage 03/16/2014    Surgical History: Past Surgical History:  Procedure Laterality Date   APPENDECTOMY     BACK SURGERY     CARPAL TUNNEL RELEASE Bilateral    EYE SURGERY Bilateral    Cataract Extraction with IOL   HERNIA REPAIR     Umbilical Hernia X 3   JOINT REPLACEMENT     bilat knees   NASAL SINUS SURGERY     REPLACEMENT TOTAL KNEE Bilateral    SHOULDER SURGERY Right    TONSILLECTOMY     TOTAL HIP ARTHROPLASTY Right 10/12/2015   Procedure: TOTAL HIP ARTHROPLASTY;  Surgeon: Donato Heinz, MD;  Location: [facility] ORS;  Service: Orthopedics;  Laterality: Right;    Home Medications:  Allergies as of  12/21/2022       Reactions   Lisinopril Cough   Penicillins Hives, Swelling   Has patient had a PCN reaction causing immediate rash, facial/tongue/throat swelling, SOB or lightheadedness with hypotension: yES Has patient had a PCN reaction causing severe rash involving mucus membranes or skin necrosis: UNKNOWN Has patient had a PCN reaction that required hospitalization NO Has patient had a PCN reaction occurring within the last 10 years: nO If all of the above answers are "NO", then may proceed with Cephalosporin use.         Medication List        Accurate as of December 21, 2022 10:56 AM. If you have any questions, ask your nurse or doctor.          STOP taking these medications    fluticasone 50 MCG/ACT nasal spray Commonly known as: FLONASE Stopped by: Vanna Scotland       TAKE these medications    acetaminophen 325 MG tablet Commonly known as: TYLENOL Take 650 mg by mouth every 6 (six) hours as needed.   amLODipine 10 MG tablet Commonly known as: NORVASC Take 10 mg by mouth daily.   aspirin EC 81 MG tablet Take 81 mg by mouth daily.   CALCIUM-VITAMIN D PO Take by mouth.   chlorthalidone 25 MG tablet Commonly known as: HYGROTON Take 25 mg by mouth See admin instructions. Patient takes only on Sunday and Wednesday   Fish Oil 1000 MG Caps Take 1 capsule by mouth 2 (two) times daily.   Jardiance 10 MG Tabs tablet Generic drug: empagliflozin Take 1 tablet by mouth daily.   levalbuterol 45 MCG/ACT inhaler Commonly known as: XOPENEX HFA Inhale 2 puffs into the lungs every 4 (four) hours as needed for wheezing.   omeprazole 20 MG capsule Commonly known as: PRILOSEC Take 20 mg by mouth daily.   Orgovyx 120 MG tablet Generic drug: relugolix TAKE 1 TABLET BY MOUTH ONCE DAILY. What changed: how much to take   PRESERVISION AREDS 2 PO Take by mouth.   traMADol 50 MG tablet Commonly known as: ULTRAM Take 50 mg by mouth 2 (two) times daily as needed.   Trelegy Ellipta 100-62.5-25 MCG/ACT Aepb Generic drug: Fluticasone-Umeclidin-Vilant Inhale 1 Inhalation into the lungs at bedtime.   Xarelto 20 MG Tabs tablet Generic drug: rivaroxaban Take 1 tablet by mouth daily with supper. What changed: Another medication with the same name was removed. Continue taking this medication, and follow the directions you see here. Changed by: Vanna Scotland        Allergies:  Allergies  Allergen Reactions   Lisinopril Cough   Penicillins Hives and Swelling    Has patient had a PCN  reaction causing immediate rash, facial/tongue/throat swelling, SOB or lightheadedness with hypotension: yES Has patient had a PCN reaction causing severe rash involving mucus membranes or skin necrosis: UNKNOWN Has patient had a PCN reaction that required hospitalization NO Has patient had a PCN reaction occurring within the last 10 years: nO If all of the above answers are "NO", then may proceed with Cephalosporin use.    Family History: Family History  Problem Relation Age of Onset   Bladder Cancer Neg Hx    Kidney cancer Neg Hx    Prostate cancer Neg Hx     Social History:  reports that he has quit smoking. His smoking use included cigarettes. He has never used smokeless tobacco. He reports that he does not drink alcohol and does not use drugs.  Physical Exam: BP 121/68 (BP Location: Left Arm, Patient Position: Sitting, Cuff Size: Normal)   Pulse 86   Ht 5\' 9"  (1.753 m)   Wt 196 lb 9.6 oz (89.2 kg)   BMI 29.03 kg/m   Constitutional:  Alert and oriented, No acute distress. HEENT: [place] AT, moist mucus membranes.  Trachea midline, no masses. Neurologic: Grossly intact, no focal deficits, moving all 4 extremities. Psychiatric: Normal mood and affect.  Assessment & Plan:    1. Prostate cancer - Palliative ADT in the form of Orgovyx - His PSA is pending today, he was previously relatively low - Continue weight-bearing exercises, calcium, and vitamin D - Monitor PSA levels; follow-up in six months for labs only. - If urinary symptoms worsen, consider more aggressive treatment options; he his daughter do endorse that his voiding symptoms are significantly improved and overall satisfied  Return in 6 months (on 06/20/2023) for PSA lab visit. 65yr f/u w/ IPSS, PVR, and PSA .  I have reviewed the above documentation for accuracy and completeness, and I agree with the above.   Vanna Scotland, MD   [facility] [address] [place], [place]  [address] [phone]

## 2023-01-13 DIAGNOSIS — Z7729 Contact with and (suspected ) exposure to other hazardous substances: Secondary | ICD-10-CM | POA: Insufficient documentation

## 2023-01-31 NOTE — Discharge Instructions (Addendum)
Instructions after Total Hip Replacement   James P. Angie Fava., M.D.    Dept. of Orthopaedics & Sports Medicine [facility] [address] [place], [place]  [address]  Phone: [phone]   Fax: [phone]        [email or web address]        DIET: Drink plenty of non-alcoholic fluids. Resume your normal diet. Include foods high in fiber.  ACTIVITY:  You may use crutches or a walker with weight-bearing as tolerated, unless instructed otherwise. You may be weaned off of the walker or crutches by your Physical Therapist.  Do NOT reach below the level of your knees or cross your legs until allowed.    Continue doing gentle exercises. Exercising will reduce the pain and swelling, increase motion, and prevent muscle weakness.   Please continue to use the TED compression stockings for 6 weeks. You may remove the stockings at night, but should reapply them in the morning. Do not drive or operate any equipment until instructed.  WOUND CARE:  Continue to use ice packs periodically to reduce pain and swelling. Keep the incision clean and dry. You may bathe or shower after the staples are removed at the first office visit following surgery.  MEDICATIONS: You may resume your regular medications. Please take the pain medication as prescribed on the medication. Do not take pain medication on an empty stomach. You have been given a prescription for a blood thinner to prevent blood clots. Please take the medication as instructed. (NOTE: After completing a 2 week course of Lovenox, take one Enteric-coated aspirin once a day.) Pain medications and iron supplements can cause constipation. Use a stool softener (Senokot or Colace) on a daily basis and a laxative (dulcolax or miralax) as needed. Do not drive or drink alcoholic beverages when taking pain medications.  CALL THE OFFICE FOR: Temperature above 101 degrees Excessive bleeding or drainage on the dressing. Excessive swelling,  coldness, or paleness of the toes. Persistent nausea and vomiting.  FOLLOW-UP:  You should have an appointment to return to the office in 6 weeks after surgery. Arrangements have been made for continuation of Physical Therapy (either home therapy or outpatient therapy).     [facility] Department Directory         [email or web address]       [email or web address]          Cardiology  Appointments: [place] - [phone]  Endocrinology  Appointments: [place] [phone] [place] - [phone]  Gastroenterology  Appointments: [place] [phone] [place] - [phone]        General Surgery   Appointments: [facility]  Internal Medicine/Family Medicine  Appointments: [facility] [place] - [phone] [place] - [phone]  Metabolic and Weigh Loss Surgery  Appointments: [facility]        Neurology  Appointments: [place] [phone] [place] - [phone]  Neurosurgery  Appointments: [place]  Obstetrics & Gynecology  Appointments: [place] [phone] [place] - [phone]        Pediatrics  Appointments: [place] [phone] [place] - [phone]  Physiatry  Appointments: [place] [phone]  Physical Therapy  Appointments: [place] - [phone]        Podiatry  Appointments: [place] [phone] [place] - [phone]  Pulmonology  Appointments: [place]  Rheumatology  Appointments: [place] [phone]        [place] Location: [facility]  [address] [place], [place]  [address]  [place] Location: [facility] [address] [place], [place]  [address]  [place] Location: [facility] [address] [place], [place]  [address]       These agencies are NOT affiliated with [facility] in any way, These are all Private Pay, not  covered by Insurance.  The cost will vary depending on level of care needed.  This list is used as a Sports administrator and not a recommendation.  PCS (Personal Care Service) service and placement assistance agency [facility],  Candie Chroman  [phone] Does not require a Contract or Min number of hours  PCS (Personal Care Service) service and placement assistance agency [facility] , Valeda Malm,  [phone] Does have a Minimum number of hours required  [facility] (Personal Care Service) [facility], Mellody Drown [phone] Does have a Minimum number of hours required

## 2023-02-08 ENCOUNTER — Encounter
Admission: RE | Admit: 2023-02-08 | Discharge: 2023-02-08 | Disposition: A | Discharge status: Home / Self Care | Payer: Medicare Other | Source: Ambulatory Visit | Attending: Orthopedic Surgery | Admitting: Orthopedic Surgery

## 2023-02-08 ENCOUNTER — Other Ambulatory Visit: Payer: Self-pay

## 2023-02-08 VITALS — BP 109/58 | HR 63 | Resp 18 | Ht 69.5 in | Wt 193.0 lb

## 2023-02-08 DIAGNOSIS — Z880 Allergy status to penicillin: Secondary | ICD-10-CM | POA: Diagnosis not present

## 2023-02-08 DIAGNOSIS — M1612 Unilateral primary osteoarthritis, left hip: Secondary | ICD-10-CM | POA: Insufficient documentation

## 2023-02-08 DIAGNOSIS — I10 Essential (primary) hypertension: Secondary | ICD-10-CM | POA: Insufficient documentation

## 2023-02-08 DIAGNOSIS — Z01812 Encounter for preprocedural laboratory examination: Secondary | ICD-10-CM | POA: Diagnosis present

## 2023-02-08 DIAGNOSIS — Z01818 Encounter for other preprocedural examination: Secondary | ICD-10-CM

## 2023-02-08 LAB — URINALYSIS, ROUTINE W REFLEX MICROSCOPIC
Bacteria, UA: NONE SEEN
Bilirubin Urine: NEGATIVE
Glucose, UA: NEGATIVE mg/dL
Hgb urine dipstick: NEGATIVE
Ketones, ur: NEGATIVE mg/dL
Leukocytes,Ua: NEGATIVE
Nitrite: NEGATIVE
Protein, ur: 100 mg/dL — AB
Specific Gravity, Urine: 1.017 (ref 1.005–1.030)
pH: 5 (ref 5.0–8.0)

## 2023-02-08 LAB — SURGICAL PCR SCREEN
MRSA, PCR: NEGATIVE
Staphylococcus aureus: NEGATIVE

## 2023-02-08 LAB — TYPE AND SCREEN
ABO/RH(D): A POS
Antibody Screen: NEGATIVE

## 2023-02-08 LAB — COMPREHENSIVE METABOLIC PANEL
ALT: 16 U/L (ref 0–44)
AST: 26 U/L (ref 15–41)
Albumin: 3.9 g/dL (ref 3.5–5.0)
Alkaline Phosphatase: 65 U/L (ref 38–126)
Anion gap: 10 (ref 5–15)
BUN: 31 mg/dL — ABNORMAL HIGH (ref 8–23)
CO2: 26 mmol/L (ref 22–32)
Calcium: 9.3 mg/dL (ref 8.9–10.3)
Chloride: 103 mmol/L (ref 98–111)
Creatinine, Ser: 1.24 mg/dL (ref 0.61–1.24)
GFR, Estimated: 55 mL/min — ABNORMAL LOW (ref >60)
Glucose, Bld: 98 mg/dL (ref 70–99)
Potassium: 3.8 mmol/L (ref 3.5–5.1)
Sodium: 139 mmol/L (ref 135–145)
Total Bilirubin: 1.1 mg/dL (ref 0.3–1.2)
Total Protein: 6.7 g/dL (ref 6.5–8.1)

## 2023-02-08 LAB — CBC
HCT: 42.3 % (ref 39.0–52.0)
Hemoglobin: 14 g/dL (ref 13.0–17.0)
MCH: 29.9 pg (ref 26.0–34.0)
MCHC: 33.1 g/dL (ref 30.0–36.0)
MCV: 90.2 fL (ref 80.0–100.0)
Platelets: 201 10*3/uL (ref 150–400)
RBC: 4.69 MIL/uL (ref 4.22–5.81)
RDW: 14 % (ref 11.5–15.5)
WBC: 8.2 10*3/uL (ref 4.0–10.5)
nRBC: 0 % (ref 0.0–0.2)

## 2023-02-08 LAB — C-REACTIVE PROTEIN: CRP: 1.3 mg/dL — ABNORMAL HIGH (ref <1.0)

## 2023-02-08 LAB — SEDIMENTATION RATE: Sed Rate: 13 mm/h (ref 0–20)

## 2023-02-08 NOTE — Patient Instructions (Addendum)
Your procedure is scheduled NW:GNFAOZ 02/15/2023 Report to the Registration Desk on the 1st floor of the [facility]. To find out your arrival time, please call [phone] between 1PM - 3PM on: Thursday 02/14/23 If your arrival time is 6:00 am, do not arrive before that time as the Medical Mall entrance doors do not open until 6:00 am.  REMEMBER: Instructions that are not followed completely may result in serious medical risk, up to and including death; or upon the discretion of your surgeon and anesthesiologist your surgery may need to be rescheduled.  Do not eat food after midnight the night before surgery.  No gum chewing or hard candies.  You may however, drink CLEAR liquids up to 2 hours before you are scheduled to arrive for your surgery. Do not drink anything within 2 hours of your scheduled arrival time.  Clear liquids include: - water  - apple juice without pulp - gatorade (not RED colors) - black coffee or tea (Do NOT add milk or creamers to the coffee or tea) Do NOT drink anything that is not on this list.   In addition, your doctor has ordered for you to drink the provided:  Ensure Pre-Surgery Clear Carbohydrate Drink  Drinking this carbohydrate drink up to two hours before surgery helps to reduce insulin resistance and improve patient outcomes. Please complete drinking 2 hours before scheduled arrival time.  One week prior to surgery: Stop Anti-inflammatories (NSAIDS) such as Advil, Aleve, Ibuprofen, Motrin, Naproxen, Naprosyn and Aspirin based products such as Excedrin, Goody's Powder, BC Powder. Stop ANY OVER THE COUNTER supplements until after surgery.  You may however, continue to take Tylenol if needed for pain up until the day of surgery.  **Follow recommendations regarding stopping blood thinners.** Call your doctor and ask when to stop XARELTO 20 MG before your surgery.  Continue taking all of your other prescription medications up until the day of  surgery.  ON THE DAY OF SURGERY ONLY TAKE THESE MEDICATIONS WITH SIPS OF WATER:  amLODipine (NORVASC) 10 MG  doxycycline (VIBRA-TABS) 100 MG omeprazole (PRILOSEC) 20 MG   Use inhalers on the day of surgery and bring to the hospital. TRELEGY ELLIPTA 100-62.5-25 MCG/ACT AEPB  albuterol (VENTOLIN HFA) 108 (90 Base) MCG/ACT inhaler   No Alcohol for 24 hours before or after surgery.  No Smoking including e-cigarettes for 24 hours before surgery.  No chewable tobacco products for at least 6 hours before surgery.  No nicotine patches on the day of surgery.  Do not use any "recreational" drugs for at least a week (preferably 2 weeks) before your surgery.  Please be advised that the combination of cocaine and anesthesia may have negative outcomes, up to and including death. If you test positive for cocaine, your surgery will be cancelled.  On the morning of surgery brush your teeth with toothpaste and water, you may rinse your mouth with mouthwash if you wish. Do not swallow any toothpaste or mouthwash.  Use CHG Soap or wipes as directed on instruction sheet.  Do not wear jewelry, make-up, hairpins, clips or nail polish.  For welded (permanent) jewelry: bracelets, anklets, waist bands, etc.  Please have this removed prior to surgery.  If it is not removed, there is a chance that hospital personnel will need to cut it off on the day of surgery.  Do not wear lotions, powders, or perfumes.   Do not shave body hair from the neck down 48 hours before surgery.  Contact lenses, hearing aids and  dentures may not be worn into surgery.  Do not bring valuables to the hospital. [facility] is not responsible for any missing/lost belongings or valuables.   Total Shoulder Arthroplasty:  use Benzoyl Peroxide 5% Gel as directed on instruction sheet.  Bring your C-PAP to the hospital in case you may have to spend the night.   Notify your doctor if there is any change in your medical condition (cold,  fever, infection).  Wear comfortable clothing (specific to your surgery type) to the hospital.  After surgery, you can help prevent lung complications by doing breathing exercises.  Take deep breaths and cough every 1-2 hours. Your doctor may order a device called an Incentive Spirometer to help you take deep breaths. When coughing or sneezing, hold a pillow firmly against your incision with both hands. This is called "splinting." Doing this helps protect your incision. It also decreases belly discomfort.  If you are being admitted to the hospital overnight, leave your suitcase in the car. After surgery it may be brought to your room.  In case of increased patient census, it may be necessary for you, the patient, to continue your postoperative care in the Same Day Surgery department.  If you are being discharged the day of surgery, you will not be allowed to drive home. You will need a responsible individual to drive you home and stay with you for 24 hours after surgery.   If you are taking public transportation, you will need to have a responsible individual with you.  Please call the Pre-admissions Testing Dept. at [phone] if you have any questions about these instructions.  Surgery Visitation Policy:  Patients having surgery or a procedure may have two visitors.  Children under the age of 25 must have an adult with them who is not the patient.  Inpatient Visitation:    Visiting hours are 7 a.m. to 8 p.m. Up to four visitors are allowed at one time in a patient room. The visitors may rotate out with other people during the day. One visitor age 42 or older may stay with the patient overnight and must be in the room by 8 p.m.    Pre-operative 5 CHG Bath Instructions   You can play a key role in reducing the risk of infection after surgery. Your skin needs to be as free of germs as possible. You can reduce the number of germs on your skin by washing with CHG (chlorhexidine  gluconate) soap before surgery. CHG is an antiseptic soap that kills germs and continues to kill germs even after washing.   DO NOT use if you have an allergy to chlorhexidine/CHG or antibacterial soaps. If your skin becomes reddened or irritated, stop using the CHG and notify one of our RNs at [phone].   Please shower with the CHG soap starting 4 days before surgery using the following schedule:     Please keep in mind the following:  DO NOT shave, including legs and underarms, starting the day of your first shower.   You may shave your face at any point before/day of surgery.  Place clean sheets on your bed the day you start using CHG soap. Use a clean washcloth (not used since being washed) for each shower. DO NOT sleep with pets once you start using the CHG.   CHG Shower Instructions:  If you choose to wash your hair and private area, wash first with your normal shampoo/soap.  After you use shampoo/soap, rinse your hair and body  thoroughly to remove shampoo/soap residue.  Turn the water OFF and apply about 3 tablespoons (45 ml) of CHG soap to a CLEAN washcloth.  Apply CHG soap ONLY FROM YOUR NECK DOWN TO YOUR TOES (washing for 3-5 minutes)  DO NOT use CHG soap on face, private areas, open wounds, or sores.  Pay special attention to the area where your surgery is being performed.  If you are having back surgery, having someone wash your back for you may be helpful. Wait 2 minutes after CHG soap is applied, then you may rinse off the CHG soap.  Pat dry with a clean towel  Put on clean clothes/pajamas   If you choose to wear lotion, please use ONLY the CHG-compatible lotions on the back of this paper.     Additional instructions for the day of surgery: DO NOT APPLY any lotions, deodorants, cologne, or perfumes.   Put on clean/comfortable clothes.  Brush your teeth.  Ask your nurse before applying any prescription medications to the skin.      CHG Compatible Lotions    Aveeno Moisturizing lotion  Cetaphil Moisturizing Cream  Cetaphil Moisturizing Lotion  Clairol Herbal Essence Moisturizing Lotion, Dry Skin  Clairol Herbal Essence Moisturizing Lotion, Extra Dry Skin  Clairol Herbal Essence Moisturizing Lotion, Normal Skin  Curel Age Defying Therapeutic Moisturizing Lotion with Alpha Hydroxy  Curel Extreme Care Body Lotion  Curel Soothing Hands Moisturizing Hand Lotion  Curel Therapeutic Moisturizing Cream, Fragrance-Free  Curel Therapeutic Moisturizing Lotion, Fragrance-Free  Curel Therapeutic Moisturizing Lotion, Original Formula  Eucerin Daily Replenishing Lotion  Eucerin Dry Skin Therapy Plus Alpha Hydroxy Crme  Eucerin Dry Skin Therapy Plus Alpha Hydroxy Lotion  Eucerin Original Crme  Eucerin Original Lotion  Eucerin Plus Crme Eucerin Plus Lotion  Eucerin TriLipid Replenishing Lotion  Keri Anti-Bacterial Hand Lotion  Keri Deep Conditioning Original Lotion Dry Skin Formula Softly Scented  Keri Deep Conditioning Original Lotion, Fragrance Free Sensitive Skin Formula  Keri Lotion Fast Absorbing Fragrance Free Sensitive Skin Formula  Keri Lotion Fast Absorbing Softly Scented Dry Skin Formula  Keri Original Lotion  Keri Skin Renewal Lotion Keri Silky Smooth Lotion  Keri Silky Smooth Sensitive Skin Lotion  Nivea Body Creamy Conditioning Oil  Nivea Body Extra Enriched Lotion  Nivea Body Original Lotion  Nivea Body Sheer Moisturizing Lotion Nivea Crme  Nivea Skin Firming Lotion  NutraDerm 30 Skin Lotion  NutraDerm Skin Lotion  NutraDerm Therapeutic Skin Cream  NutraDerm Therapeutic Skin Lotion  ProShield Protective Hand Cream  Provon moisturizing lotion   How to Use an Incentive Spirometer  An incentive spirometer is a tool that measures how well you are filling your lungs with each breath. Learning to take long, deep breaths using this tool can help you keep your lungs clear and active. This may help to reverse or lessen your chance of  developing breathing (pulmonary) problems, especially infection. You may be asked to use a spirometer: After a surgery. If you have a lung problem or a history of smoking. After a long period of time when you have been unable to move or be active. If the spirometer includes an indicator to show the highest number that you have reached, your health care provider or respiratory therapist will help you set a goal. Keep a log of your progress as told by your health care provider. What are the risks? Breathing too quickly may cause dizziness or cause you to pass out. Take your time so you do not get dizzy or light-headed. If  you are in pain, you may need to take pain medicine before doing incentive spirometry. It is harder to take a deep breath if you are having pain. How to use your incentive spirometer  Sit up on the edge of your bed or on a chair. Hold the incentive spirometer so that it is in an upright position. Before you use the spirometer, breathe out normally. Place the mouthpiece in your mouth. Make sure your lips are closed tightly around it. Breathe in slowly and as deeply as you can through your mouth, causing the piston or the ball to rise toward the top of the chamber. Hold your breath for 3-5 seconds, or for as long as possible. If the spirometer includes a coach indicator, use this to guide you in breathing. Slow down your breathing if the indicator goes above the marked areas. Remove the mouthpiece from your mouth and breathe out normally. The piston or ball will return to the bottom of the chamber. Rest for a few seconds, then repeat the steps 10 or more times. Take your time and take a few normal breaths between deep breaths so that you do not get dizzy or light-headed. Do this every 1-2 hours when you are awake. If the spirometer includes a goal marker to show the highest number you have reached (best effort), use this as a goal to work toward during each repetition. After each  set of 10 deep breaths, cough a few times. This will help to make sure that your lungs are clear. If you have an incision on your chest or abdomen from surgery, place a pillow or a rolled-up towel firmly against the incision when you cough. This can help to reduce pain while taking deep breaths and coughing. General tips When you are able to get out of bed: Walk around often. Continue to take deep breaths and cough in order to clear your lungs. Keep using the incentive spirometer until your health care provider says it is okay to stop using it. If you have been in the hospital, you may be told to keep using the spirometer at home. Contact a health care provider if: You are having difficulty using the spirometer. You have trouble using the spirometer as often as instructed. Your pain medicine is not giving enough relief for you to use the spirometer as told. You have a fever. Get help right away if: You develop shortness of breath. You develop a cough with bloody mucus from the lungs. You have fluid or blood coming from an incision site after you cough. Summary An incentive spirometer is a tool that can help you learn to take long, deep breaths to keep your lungs clear and active. You may be asked to use a spirometer after a surgery, if you have a lung problem or a history of smoking, or if you have been inactive for a long period of time. Use your incentive spirometer as instructed every 1-2 hours while you are awake. If you have an incision on your chest or abdomen, place a pillow or a rolled-up towel firmly against your incision when you cough. This will help to reduce pain. Get help right away if you have shortness of breath, you cough up bloody mucus, or blood comes from your incision when you cough. This information is not intended to replace advice given to you by your health care provider. Make sure you discuss any questions you have with your health care provider. Document Revised:  06/22/2019 Document Reviewed: 06/22/2019  Elsevier Patient Education  2023 [employer].

## 2023-02-12 LAB — IGE: IgE (Immunoglobulin E), Serum: 17 [IU]/mL (ref 6–495)

## 2023-02-14 ENCOUNTER — Encounter: Payer: Self-pay | Admitting: Orthopedic Surgery

## 2023-02-14 NOTE — H&P (Signed)
ORTHOPAEDIC HISTORY & PHYSICAL Dennis Maudlin, PA - 02/11/2023 3:15 PM EDT Formatting of this note is different from the original. Images from the original note were not included. Chief Complaint Chief Complaint Patient presents with Pre-op Exam Scheduled for Left THA 02/15/23 with Dr. Ernest Pine  Reason for Visit Dennis Savage is a 89 y.o. who presents today for a history and physical. He is to undergo a left total hip arthroplasty on 02/15/2023. Since his last visit at the clinic there has been no change in his condition. The patient expresses his desire to proceed with surgery.  He has a long history of low back pain and the more recent onset of left hip and groin pain. The pain is worse with weight bearing and range of motion of the hip . He has noticed progressive decrease of his left hip motion. The hip pain limits the patient's ability to ambulate long distances. The patient has not appreciated any significant improvement despite Tylenol, activity modification, and ambulatory aids. He is unable to tolerate NSAIDs due to anticoagulation with Xarelto. He is using a cane or walker for ambulation . The patient states that the hip pain has progressed to the point that it is significantly interfering with his activities of daily living.  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 A diagnostic hip injection did provide some temporary pain relief  Past Medical History Past Medical History: Diagnosis Date Adrenal nodule (CMS/HHS-HCC) Bilateral, seen on serial imaging AF (atrial fibrillation) (CMS/HHS-HCC) Aortic atherosclerosis (CMS-HCC) Bilateral renal cysts Cataract cortical, senile Chicken pox COPD (chronic obstructive pulmonary disease) (CMS/HHS-HCC) Hepatic steatosis ILD (interstitial lung disease) (CMS/HHS-HCC) Migraines Sleep apnea Ulcer  Past Surgical History Past Surgical  History: Procedure Laterality Date SHOULDER ARTHROSCOPY W/ SUBACROMIAL DECOMPRESSION AND DISTAL CLAVICLE EXCISION Right 07/01/2009 Right total knee arthroplasty 02/08/2010 Dr. Ernest Pine Right total hip arthroplasty 10/12/2015 Dr Ernest Pine APPENDECTOMY BACK SURGERY x2; #1 "ruptured [phone], #2 cleaned up scar tissue 1996 CATARACT EXTRACTION Bilateral FUNCTIONAL ENDOSCOPIC SINUS SURGERY HERNIA REPAIR KNEE ARTHROSCOPY Left total knee arthroplasty  Past Family History Family History Problem Relation Age of Onset Myocardial Infarction (Heart attack) Mother Diabetes type II Father Diabetes type II Brother Colon cancer Brother Gout Brother  Medications Current Outpatient Medications Medication Sig Dispense Refill acetaminophen (TYLENOL) 500 MG tablet Take 1,000 mg by mouth every 6 (six) hours as needed for Pain albuterol MDI, PROVENTIL, VENTOLIN, PROAIR, HFA 90 mcg/actuation inhaler Inhale 2 inhalations into the lungs every 6 (six) hours as needed for Wheezing 1 each 0 amLODIPine (NORVASC) 10 MG tablet Take 1 tablet by mouth once daily 90 tablet 1 chlorthalidone 25 MG tablet Take 1 tablet by mouth once daily 90 tablet 0 fluticasone propionate (FLONASE) 50 mcg/actuation nasal spray Place 1 spray into both nostrils 2 (two) times daily 16 g 0 fluticasone-umeclidinium-vilanterol (TRELEGY ELLIPTA) 100-62.5-25 mcg inhaler Inhale 1 Puff into the lungs once daily 1 each 6 levalbuterol (XOPENEX HFA) inhaler Inhale 2 inhalations into the lungs every 4 (four) hours as needed for Wheezing or Shortness of Breath 15 g 0 omega-3 fatty acids/fish oil 340-1,000 mg capsule Take 1 capsule by mouth 2 (two) times daily. omeprazole (PRILOSEC) 20 MG DR capsule Take 1 capsule by mouth once daily 90 capsule 0 potassium chloride (KLOR-CON) 10 MEQ ER tablet Take 1 tablet (10 mEq total) by mouth once daily 14 tablet 0 relugolix (ORGOVYX) 120 mg tablet Take 1 tablet by mouth once daily  traMADoL (ULTRAM) 50 mg tablet  Take 1 tablet (50 mg total) by mouth 2 (two) times daily as needed 1 po bid prn 60 tablet 1 vibegron (GEMTESA) 75 mg Tab Take by mouth VIT C/E/ZN/COPPR/LUTEIN/ZEAXAN (PRESERVISION AREDS 2 ORAL) Take 1 capsule by mouth 2 (two) times daily XARELTO 20 mg tablet TAKE 1 TABLET BY MOUTH ONCE DAILY WITH SUPPER 90 tablet 0  No current facility-administered medications for this visit.  Allergies Allergies Allergen Reactions Penicillins Hives and Swelling Has patient had a PCN reaction causing immediate rash, facial/tongue/throat swelling, SOB or lightheadedness with hypotension: yES Has patient had a PCN reaction causing severe rash involving mucus membranes or skin necrosis: UNKNOWN Has patient had a PCN reaction that required hospitalization NO Has patient had a PCN reaction occurring within the last 10 years: nO If all of the above answers are "NO", then may proceed with Cephalosporin use.   Review of Systems A comprehensive 14 point ROS was performed, reviewed, and the pertinent orthopaedic findings are documented in the HPI.  Exam BP 118/60  Ht 175.3 cm (5\' 9" )  Wt 88.9 kg (196 lb)  BMI 28.94 kg/m  General: Well-developed well-nourished male seen in no acute distress.  HEENT: Atraumatic,normocephalic. Pupils are equal and reactive to light. Oropharynx is clear with moist mucosa  Lungs: Clear to auscultation bilaterally  Cardiovascular: Regular rate and rhythm. Normal S1, S2. No murmurs. No appreciable gallops or rubs. Peripheral pulses are palpable.  Abdomen: Soft, non-tender, nondistended. Bowel sounds present  Extremity: Left Hip: Soft tissue swelling:Negative Erythema: Negative Crepitance: Negative Tenderness: Greater trochanter is nontender to palpation. Severe pain is elicited by axial compression or extremes of rotation. Atrophy: No atrophy. Fair to good hip flexor and abductor strength. Range of Motion:EXT/FLEX: 0/0/100 ADD/ABD: 20/0/20 IR/ER:  0/0/20   Neurological:  The patient is alert and oriented Sensation to light touch appears to be intact and within normal limits Gross motor strength appeared to be equal to 5/5  Vascular :  Peripheral pulses felt to be palpable. Capillary refill appears to be intact and within normal limits  X-ray  1. 3 views of the left hip ordered and interpreted on today's visit shows bone-on-bone to the central portion of the acetabulum. Early osteophyte formation noted to the acetabulum as well. Patient also had MRI performed of the left hip.  MR OF THE LEFT HIP WITHOUT CONTRAST  TECHNIQUE: Multiplanar, multisequence MR imaging was performed. No intravenous contrast was administered.  COMPARISON: None Available.  FINDINGS: Bone  No left hip fracture, dislocation or avascular necrosis. No aggressive osseous lesion. Status post right hip arthroplasty with susceptibility artifact.  SI joints are normal. No SI joint widening or erosive changes.  Lower lumbar spine demonstrates no focal abnormality.  Alignment  Normal. No subluxation.  Dysplasia  None.  Joint effusion  No joint effusions.  Labrum  Marked degenerative changes of the labrum with degeneration and diminution.  Cartilage  Near complete loss of the articular cartilage with subchondral edema of the femoral head  Capsule and ligaments  Normal.  Muscles and Tendons  Flexors: Normal.  Extensors: Normal.  Abductors: Normal.  Adductors: Normal.  Rotators: Normal.  Hamstrings: Normal.  Other Findings  No bursal fluid.  Viscera  No abnormality seen in pelvis. No lymphadenopathy. No free fluid in the pelvis.  IMPRESSION: 1. No evidence of fracture or osteonecrosis. 2. Moderate left hip osteoarthritis with advanced degenerative changes of the labrum and near complete loss of articular cartilage with subchondral edema of the femoral  head. No significant joint effusion. 3. Muscles and tendons  are unremarkable. No evidence of bursitis. 4. Status post right hip arthroplasty with susceptibility artifact.  Electronically Signed By: Larose Hires D.O. On: 10/16/2022 22:08  Impression  1. Degenerative arthrosis left hip  Plan  1. Past medical history was reviewed 2. Medication was gone over on today's visit 3. Return to clinic 6 weeks postop. Sooner if any problems 4. Postop rehab course discussed  This note was generated in part with voice recognition software and I apologize for any typographical errors that were not detected and corrected   Tera Partridge PA Electronically signed by Dennis Maudlin, PA at 02/11/2023 4:48 PM EDT

## 2023-02-15 ENCOUNTER — Ambulatory Visit: Payer: Medicare Other | Admitting: Anesthesiology

## 2023-02-15 ENCOUNTER — Observation Stay
Admission: RE | Admit: 2023-02-15 | Discharge: 2023-02-20 | Disposition: A | Discharge status: Home / Self Care | Payer: Medicare Other | Attending: Orthopedic Surgery | Admitting: Orthopedic Surgery

## 2023-02-15 ENCOUNTER — Encounter
Admission: RE | Disposition: A | Discharge status: Home / Self Care | Payer: Self-pay | Source: Home / Self Care | Attending: Orthopedic Surgery

## 2023-02-15 ENCOUNTER — Other Ambulatory Visit: Payer: Self-pay

## 2023-02-15 ENCOUNTER — Observation Stay: Payer: Medicare Other

## 2023-02-15 ENCOUNTER — Encounter: Payer: Self-pay | Admitting: Orthopedic Surgery

## 2023-02-15 ENCOUNTER — Ambulatory Visit: Payer: Medicare Other | Admitting: Urgent Care

## 2023-02-15 DIAGNOSIS — I4891 Unspecified atrial fibrillation: Secondary | ICD-10-CM | POA: Insufficient documentation

## 2023-02-15 DIAGNOSIS — E119 Type 2 diabetes mellitus without complications: Secondary | ICD-10-CM

## 2023-02-15 DIAGNOSIS — J449 Chronic obstructive pulmonary disease, unspecified: Secondary | ICD-10-CM | POA: Diagnosis not present

## 2023-02-15 DIAGNOSIS — Z96653 Presence of artificial knee joint, bilateral: Secondary | ICD-10-CM | POA: Insufficient documentation

## 2023-02-15 DIAGNOSIS — M1612 Unilateral primary osteoarthritis, left hip: Principal | ICD-10-CM | POA: Insufficient documentation

## 2023-02-15 DIAGNOSIS — Z79899 Other long term (current) drug therapy: Secondary | ICD-10-CM | POA: Diagnosis not present

## 2023-02-15 DIAGNOSIS — Z880 Allergy status to penicillin: Secondary | ICD-10-CM

## 2023-02-15 DIAGNOSIS — Z96659 Presence of unspecified artificial knee joint: Secondary | ICD-10-CM

## 2023-02-15 DIAGNOSIS — R531 Weakness: Secondary | ICD-10-CM

## 2023-02-15 DIAGNOSIS — Z7901 Long term (current) use of anticoagulants: Secondary | ICD-10-CM | POA: Insufficient documentation

## 2023-02-15 DIAGNOSIS — N189 Chronic kidney disease, unspecified: Secondary | ICD-10-CM

## 2023-02-15 DIAGNOSIS — N179 Acute kidney failure, unspecified: Secondary | ICD-10-CM

## 2023-02-15 DIAGNOSIS — E278 Other specified disorders of adrenal gland: Secondary | ICD-10-CM

## 2023-02-15 DIAGNOSIS — I739 Peripheral vascular disease, unspecified: Secondary | ICD-10-CM

## 2023-02-15 DIAGNOSIS — I480 Paroxysmal atrial fibrillation: Secondary | ICD-10-CM

## 2023-02-15 DIAGNOSIS — D181 Lymphangioma, any site: Secondary | ICD-10-CM

## 2023-02-15 HISTORY — PX: TOTAL HIP ARTHROPLASTY: SHX124

## 2023-02-15 SURGERY — ARTHROPLASTY, HIP, TOTAL,POSTERIOR APPROACH
Anesthesia: Spinal | Site: Hip | Laterality: Left

## 2023-02-15 MED ORDER — ORAL CARE MOUTH RINSE: 15.0000 mL | Freq: Once | OROMUCOSAL | Status: AC

## 2023-02-15 MED ORDER — PROPOFOL 500 MG/50ML IV EMUL
INTRAVENOUS | Status: DC | PRN
  Administered 2023-02-15: 100 ug/kg/min via INTRAVENOUS

## 2023-02-15 MED ORDER — FLEET ENEMA RE ENEM: 1.0000 | ENEMA | Freq: Once | RECTAL | Status: DC | PRN

## 2023-02-15 MED ORDER — FLUTICASONE FUROATE-VILANTEROL 100-25 MCG/ACT IN AEPB
1.0000 | INHALATION_SPRAY | Freq: Every day | RESPIRATORY_TRACT | Status: DC
  Administered 2023-02-16 – 2023-02-20 (×4): 1 via RESPIRATORY_TRACT
  Filled 2023-02-15 (×3): qty 28

## 2023-02-15 MED ORDER — TRANEXAMIC ACID-NACL 1000-0.7 MG/100ML-% IV SOLN
1000.0000 mg | INTRAVENOUS | Status: AC
  Administered 2023-02-15: 1000 mg via INTRAVENOUS

## 2023-02-15 MED ORDER — TRAMADOL HCL 50 MG PO TABS: 50.0000 mg | ORAL_TABLET | ORAL | Status: DC | PRN

## 2023-02-15 MED ORDER — SODIUM CHLORIDE 0.9 % IV SOLN: INTRAVENOUS | Status: DC

## 2023-02-15 MED ORDER — TRANEXAMIC ACID-NACL 1000-0.7 MG/100ML-% IV SOLN
INTRAVENOUS | Status: AC
  Filled 2023-02-15: qty 100

## 2023-02-15 MED ORDER — CHLORHEXIDINE GLUCONATE 0.12 % MT SOLN
OROMUCOSAL | Status: AC
  Filled 2023-02-15: qty 15

## 2023-02-15 MED ORDER — GABAPENTIN 300 MG PO CAPS
300.0000 mg | ORAL_CAPSULE | Freq: Once | ORAL | Status: AC
  Administered 2023-02-15: 300 mg via ORAL

## 2023-02-15 MED ORDER — DOXYCYCLINE HYCLATE 100 MG PO TABS
100.0000 mg | ORAL_TABLET | Freq: Two times a day (BID) | ORAL | Status: DC
  Administered 2023-02-15 – 2023-02-20 (×10): 100 mg via ORAL
  Filled 2023-02-15 (×11): qty 1

## 2023-02-15 MED ORDER — BUPIVACAINE HCL (PF) 0.5 % IJ SOLN
INTRAMUSCULAR | Status: AC
  Filled 2023-02-15: qty 10

## 2023-02-15 MED ORDER — SENNOSIDES-DOCUSATE SODIUM 8.6-50 MG PO TABS
1.0000 | ORAL_TABLET | Freq: Two times a day (BID) | ORAL | Status: DC
  Administered 2023-02-15 – 2023-02-20 (×10): 1 via ORAL
  Filled 2023-02-15 (×10): qty 1

## 2023-02-15 MED ORDER — BISACODYL 10 MG RE SUPP
10.0000 mg | Freq: Every day | RECTAL | Status: DC | PRN
  Administered 2023-02-18: 10 mg via RECTAL
  Filled 2023-02-15: qty 1

## 2023-02-15 MED ORDER — FERROUS SULFATE 325 (65 FE) MG PO TABS
325.0000 mg | ORAL_TABLET | Freq: Two times a day (BID) | ORAL | Status: DC
  Administered 2023-02-15 – 2023-02-19 (×9): 325 mg via ORAL
  Filled 2023-02-15 (×9): qty 1

## 2023-02-15 MED ORDER — FENTANYL CITRATE (PF) 100 MCG/2ML IJ SOLN
INTRAMUSCULAR | Status: AC
  Filled 2023-02-15: qty 2

## 2023-02-15 MED ORDER — RELUGOLIX 120 MG PO TABS: 120.0000 mg | ORAL_TABLET | Freq: Every evening | ORAL | Status: DC

## 2023-02-15 MED ORDER — ACETAMINOPHEN 325 MG PO TABS: 325.0000 mg | ORAL_TABLET | Freq: Four times a day (QID) | ORAL | Status: DC | PRN

## 2023-02-15 MED ORDER — METOCLOPRAMIDE HCL 5 MG PO TABS
10.0000 mg | ORAL_TABLET | Freq: Three times a day (TID) | ORAL | Status: AC
  Administered 2023-02-15 – 2023-02-17 (×8): 10 mg via ORAL
  Filled 2023-02-15 (×8): qty 2

## 2023-02-15 MED ORDER — DROPERIDOL 2.5 MG/ML IJ SOLN: 0.6250 mg | Freq: Once | INTRAMUSCULAR | Status: DC | PRN

## 2023-02-15 MED ORDER — DEXAMETHASONE SODIUM PHOSPHATE 10 MG/ML IJ SOLN
INTRAMUSCULAR | Status: AC
  Filled 2023-02-15: qty 1

## 2023-02-15 MED ORDER — MIDAZOLAM HCL 2 MG/2ML IJ SOLN
INTRAMUSCULAR | Status: AC
  Filled 2023-02-15: qty 2

## 2023-02-15 MED ORDER — CELECOXIB 200 MG PO CAPS
ORAL_CAPSULE | ORAL | Status: AC
  Filled 2023-02-15: qty 1

## 2023-02-15 MED ORDER — DEXAMETHASONE SODIUM PHOSPHATE 10 MG/ML IJ SOLN
8.0000 mg | Freq: Once | INTRAMUSCULAR | Status: AC
  Administered 2023-02-15: 8 mg via INTRAVENOUS

## 2023-02-15 MED ORDER — SURGIRINSE WOUND IRRIGATION SYSTEM - OPTIME
TOPICAL | Status: DC | PRN
  Administered 2023-02-15: 900 mL via TOPICAL

## 2023-02-15 MED ORDER — CEFAZOLIN SODIUM-DEXTROSE 2-4 GM/100ML-% IV SOLN
2.0000 g | Freq: Four times a day (QID) | INTRAVENOUS | Status: AC
  Administered 2023-02-15 (×2): 2 g via INTRAVENOUS
  Filled 2023-02-15 (×4): qty 100

## 2023-02-15 MED ORDER — TRANEXAMIC ACID-NACL 1000-0.7 MG/100ML-% IV SOLN
1000.0000 mg | Freq: Once | INTRAVENOUS | Status: AC
  Administered 2023-02-15: 1000 mg via INTRAVENOUS

## 2023-02-15 MED ORDER — CEFAZOLIN SODIUM-DEXTROSE 2-4 GM/100ML-% IV SOLN
2.0000 g | INTRAVENOUS | Status: AC
  Administered 2023-02-15: 2 g via INTRAVENOUS

## 2023-02-15 MED ORDER — CHLORTHALIDONE 25 MG PO TABS
25.0000 mg | ORAL_TABLET | ORAL | Status: DC
  Administered 2023-02-17 – 2023-02-20 (×2): 25 mg via ORAL
  Filled 2023-02-15 (×2): qty 1

## 2023-02-15 MED ORDER — PHENYLEPHRINE HCL-NACL 20-0.9 MG/250ML-% IV SOLN
INTRAVENOUS | Status: DC | PRN
  Administered 2023-02-15 (×7): 160 ug via INTRAVENOUS
  Administered 2023-02-15: 20 ug/min via INTRAVENOUS

## 2023-02-15 MED ORDER — PROPOFOL 1000 MG/100ML IV EMUL
INTRAVENOUS | Status: AC
  Filled 2023-02-15: qty 100

## 2023-02-15 MED ORDER — PROPOFOL 10 MG/ML IV BOLUS
INTRAVENOUS | Status: DC | PRN
  Administered 2023-02-15: 50 mg via INTRAVENOUS

## 2023-02-15 MED ORDER — HYDROMORPHONE HCL 1 MG/ML IJ SOLN: 0.5000 mg | INTRAMUSCULAR | Status: DC | PRN

## 2023-02-15 MED ORDER — ACETAMINOPHEN 10 MG/ML IV SOLN
1000.0000 mg | Freq: Four times a day (QID) | INTRAVENOUS | Status: AC
  Administered 2023-02-15 – 2023-02-16 (×4): 1000 mg via INTRAVENOUS
  Filled 2023-02-15 (×4): qty 100

## 2023-02-15 MED ORDER — CEFAZOLIN SODIUM-DEXTROSE 2-4 GM/100ML-% IV SOLN
INTRAVENOUS | Status: AC
  Filled 2023-02-15: qty 100

## 2023-02-15 MED ORDER — AMLODIPINE BESYLATE 10 MG PO TABS
10.0000 mg | ORAL_TABLET | Freq: Every day | ORAL | Status: DC
  Administered 2023-02-16 – 2023-02-20 (×5): 10 mg via ORAL
  Filled 2023-02-15 (×5): qty 1

## 2023-02-15 MED ORDER — CELECOXIB 200 MG PO CAPS
400.0000 mg | ORAL_CAPSULE | Freq: Once | ORAL | Status: AC
  Administered 2023-02-15: 400 mg via ORAL

## 2023-02-15 MED ORDER — MENTHOL 3 MG MT LOZG: 1.0000 | LOZENGE | OROMUCOSAL | Status: DC | PRN

## 2023-02-15 MED ORDER — OXYCODONE HCL 5 MG PO TABS
5.0000 mg | ORAL_TABLET | ORAL | Status: DC | PRN
  Administered 2023-02-15 – 2023-02-17 (×4): 5 mg via ORAL
  Filled 2023-02-15 (×4): qty 1

## 2023-02-15 MED ORDER — ALUM & MAG HYDROXIDE-SIMETH 200-200-20 MG/5ML PO SUSP
30.0000 mL | ORAL | Status: DC | PRN
Start: 2023-02-15 — End: 2023-02-20

## 2023-02-15 MED ORDER — CELECOXIB 200 MG PO CAPS
200.0000 mg | ORAL_CAPSULE | Freq: Two times a day (BID) | ORAL | Status: DC
  Administered 2023-02-15 – 2023-02-20 (×10): 200 mg via ORAL
  Filled 2023-02-15 (×12): qty 1

## 2023-02-15 MED ORDER — ACETAMINOPHEN 500 MG PO TABS
ORAL_TABLET | ORAL | Status: AC
  Filled 2023-02-15: qty 2

## 2023-02-15 MED ORDER — UMECLIDINIUM BROMIDE 62.5 MCG/ACT IN AEPB
1.0000 | INHALATION_SPRAY | Freq: Every day | RESPIRATORY_TRACT | Status: DC
  Administered 2023-02-16 – 2023-02-20 (×4): 1 via RESPIRATORY_TRACT
  Filled 2023-02-15 (×4): qty 7

## 2023-02-15 MED ORDER — CHLORHEXIDINE GLUCONATE 4 % EX SOLN
60.0000 mL | Freq: Once | CUTANEOUS | Status: AC
  Administered 2023-02-15: 4 via TOPICAL

## 2023-02-15 MED ORDER — PHENOL 1.4 % MT LIQD: 1.0000 | OROMUCOSAL | Status: DC | PRN

## 2023-02-15 MED ORDER — ENSURE PRE-SURGERY PO LIQD
296.0000 mL | Freq: Once | ORAL | Status: AC
  Administered 2023-02-15: 296 mL via ORAL
  Filled 2023-02-15: qty 296

## 2023-02-15 MED ORDER — CHLORHEXIDINE GLUCONATE 0.12 % MT SOLN
15.0000 mL | Freq: Once | OROMUCOSAL | Status: AC
Start: 2023-02-15 — End: 2023-02-15
  Administered 2023-02-15: 15 mL via OROMUCOSAL

## 2023-02-15 MED ORDER — GABAPENTIN 300 MG PO CAPS
ORAL_CAPSULE | ORAL | Status: AC
  Filled 2023-02-15: qty 1

## 2023-02-15 MED ORDER — PHENYLEPHRINE HCL-NACL 20-0.9 MG/250ML-% IV SOLN
INTRAVENOUS | Status: AC
  Filled 2023-02-15: qty 250

## 2023-02-15 MED ORDER — DIPHENHYDRAMINE HCL 12.5 MG/5ML PO ELIX: 12.5000 mg | ORAL_SOLUTION | ORAL | Status: DC | PRN

## 2023-02-15 MED ORDER — ACETAMINOPHEN 10 MG/ML IV SOLN
INTRAVENOUS | Status: DC | PRN
  Administered 2023-02-15: 1000 mg via INTRAVENOUS

## 2023-02-15 MED ORDER — ONDANSETRON HCL 4 MG PO TABS: 4.0000 mg | ORAL_TABLET | Freq: Four times a day (QID) | ORAL | Status: DC | PRN

## 2023-02-15 MED ORDER — ACETAMINOPHEN 10 MG/ML IV SOLN
INTRAVENOUS | Status: AC
  Filled 2023-02-15: qty 100

## 2023-02-15 MED ORDER — ALBUTEROL SULFATE (2.5 MG/3ML) 0.083% IN NEBU: 3.0000 mL | INHALATION_SOLUTION | Freq: Four times a day (QID) | RESPIRATORY_TRACT | Status: DC | PRN

## 2023-02-15 MED ORDER — PANTOPRAZOLE SODIUM 40 MG PO TBEC
40.0000 mg | DELAYED_RELEASE_TABLET | Freq: Two times a day (BID) | ORAL | Status: DC
  Administered 2023-02-15 – 2023-02-20 (×10): 40 mg via ORAL
  Filled 2023-02-15 (×10): qty 1

## 2023-02-15 MED ORDER — RIVAROXABAN 20 MG PO TABS
20.0000 mg | ORAL_TABLET | Freq: Every day | ORAL | Status: DC
  Administered 2023-02-16 – 2023-02-19 (×4): 20 mg via ORAL
  Filled 2023-02-15 (×5): qty 1

## 2023-02-15 MED ORDER — FENTANYL CITRATE (PF) 100 MCG/2ML IJ SOLN
25.0000 ug | INTRAMUSCULAR | Status: DC | PRN
Start: 2023-02-15 — End: 2023-02-15

## 2023-02-15 MED ORDER — ONDANSETRON HCL 4 MG/2ML IJ SOLN: 4.0000 mg | Freq: Four times a day (QID) | INTRAMUSCULAR | Status: DC | PRN

## 2023-02-15 MED ORDER — BUPIVACAINE HCL (PF) 0.5 % IJ SOLN
INTRAMUSCULAR | Status: DC | PRN
  Administered 2023-02-15: 3 mL

## 2023-02-15 MED ORDER — MAGNESIUM HYDROXIDE 400 MG/5ML PO SUSP
30.0000 mL | Freq: Every day | ORAL | Status: DC
  Administered 2023-02-15 – 2023-02-20 (×6): 30 mL via ORAL
  Filled 2023-02-15 (×6): qty 30

## 2023-02-15 MED ORDER — SODIUM CHLORIDE 0.9 % IR SOLN
Status: DC | PRN
  Administered 2023-02-15: 3000 mL

## 2023-02-15 MED ORDER — OXYCODONE HCL 5 MG PO TABS
10.0000 mg | ORAL_TABLET | ORAL | Status: DC | PRN
  Administered 2023-02-17 – 2023-02-19 (×3): 10 mg via ORAL
  Filled 2023-02-15 (×3): qty 2

## 2023-02-15 SURGICAL SUPPLY — 50 items
BLADE SAW 90X25X1.19 OSCILLAT (BLADE) ×1 IMPLANT
BRUSH SCRUB EZ PLAIN DRY (MISCELLANEOUS) ×1 IMPLANT
CUP PINNACLE 100 SERIES 58MM (Hips) IMPLANT
DRAPE INCISE IOBAN 66X60 STRL (DRAPES) ×1 IMPLANT
DRAPE SHEET LG 3/4 BI-LAMINATE (DRAPES) ×1 IMPLANT
DRSG AQUACEL AG ADV 3.5X14 (GAUZE/BANDAGES/DRESSINGS) ×1 IMPLANT
DRSG MEPILEX SACRM 8.7X9.8 (GAUZE/BANDAGES/DRESSINGS) ×1 IMPLANT
DRSG TEGADERM 4X4.75 (GAUZE/BANDAGES/DRESSINGS) ×1 IMPLANT
DURAPREP 26ML APPLICATOR (WOUND CARE) ×2 IMPLANT
ELECT CAUTERY BLADE 6.4 (BLADE) ×1 IMPLANT
ELECT REM PT RETURN 9FT ADLT (ELECTROSURGICAL) ×1
ELECTRODE REM PT RTRN 9FT ADLT (ELECTROSURGICAL) ×1 IMPLANT
EVACUATOR 1/8 PVC DRAIN (DRAIN) ×1 IMPLANT
GLOVE BIOGEL M STRL SZ7.5 (GLOVE) ×6 IMPLANT
GLOVE SURG UNDER POLY LF SZ8 (GLOVE) ×2 IMPLANT
GOWN STRL REUS W/ TWL LRG LVL3 (GOWN DISPOSABLE) ×2 IMPLANT
GOWN STRL REUS W/ TWL XL LVL3 (GOWN DISPOSABLE) ×1 IMPLANT
GOWN STRL REUS W/TWL LRG LVL3 (GOWN DISPOSABLE) ×2
GOWN STRL REUS W/TWL XL LVL3 (GOWN DISPOSABLE) ×1
GOWN TOGA ZIPPER T7+ PEEL AWAY (MISCELLANEOUS) ×1 IMPLANT
HANDLE YANKAUER SUCT OPEN TIP (MISCELLANEOUS) ×1 IMPLANT
HEAD M SROM 36MM PLUS 1.5 (Hips) IMPLANT
HOLDER FOLEY CATH W/STRAP (MISCELLANEOUS) ×1 IMPLANT
HOOD PEEL AWAY T7 (MISCELLANEOUS) ×1 IMPLANT
IV NS IRRIG 3000ML ARTHROMATIC (IV SOLUTION) ×1 IMPLANT
KIT PEG BOARD PINK (KITS) ×1 IMPLANT
KIT TURNOVER KIT A (KITS) ×1 IMPLANT
LINER MARATHON 10D 36M (Hips) IMPLANT
LINER MARATHON 10DEG 36M (Hips) ×1 IMPLANT
MANIFOLD NEPTUNE II (INSTRUMENTS) ×2 IMPLANT
PACK HIP PROSTHESIS (MISCELLANEOUS) ×1 IMPLANT
PIN STEIN THRED 5/32 (Pin) ×1 IMPLANT
PULSAVAC PLUS IRRIG FAN TIP (DISPOSABLE) ×1
SOLUTION IRRIG SURGIPHOR (IV SOLUTION) ×1 IMPLANT
SPONGE DRAIN TRACH 4X4 STRL 2S (GAUZE/BANDAGES/DRESSINGS) ×1 IMPLANT
SROM M HEAD 36MM PLUS 1.5 (Hips) ×1 IMPLANT
STAPLER SKIN PROX 35W (STAPLE) ×1 IMPLANT
STEM FEMORAL SZ6 HIGH ACTIS (Stem) IMPLANT
SUT ETHIBOND #5 BRAIDED 30INL (SUTURE) ×1 IMPLANT
SUT VIC AB 0 CT1 36 (SUTURE) ×2 IMPLANT
SUT VIC AB 1 CT1 36 (SUTURE) ×2 IMPLANT
SUT VIC AB 2-0 CT1 27 (SUTURE) ×1
SUT VIC AB 2-0 CT1 TAPERPNT 27 (SUTURE) ×1 IMPLANT
TAPE CLOTH 3X10 WHT NS LF (GAUZE/BANDAGES/DRESSINGS) ×1 IMPLANT
TIP FAN IRRIG PULSAVAC PLUS (DISPOSABLE) ×1 IMPLANT
TOWEL OR 17X26 4PK STRL BLUE (TOWEL DISPOSABLE) IMPLANT
TRAP FLUID SMOKE EVACUATOR (MISCELLANEOUS) ×1 IMPLANT
TRAY FOLEY MTR SLVR 16FR STAT (SET/KITS/TRAYS/PACK) ×1 IMPLANT
TUBING CONNECTING 10 (TUBING) ×2 IMPLANT
WATER STERILE IRR 1000ML POUR (IV SOLUTION) ×1 IMPLANT

## 2023-02-15 NOTE — Anesthesia Procedure Notes (Signed)
Spinal  Patient location during procedure: OR Start time: 02/15/2023 7:49 AM End time: 02/15/2023 7:53 AM Reason for block: surgical anesthesia Staffing Performed: resident/CRNA  Anesthesiologist: Lenard Simmer, MD Resident/CRNA: Reece Agar, CRNA Performed by: Reece Agar, CRNA Authorized by: Lenard Simmer, MD   Preanesthetic Checklist Completed: patient identified, IV checked, site marked, risks and benefits discussed, surgical consent, monitors and equipment checked, pre-op evaluation and timeout performed Spinal Block Patient position: sitting Prep: DuraPrep Patient monitoring: heart rate, cardiac monitor, continuous pulse ox and blood pressure Approach: midline Location: L3-4 Injection technique: single-shot Needle Needle type: Tuohy  Needle gauge: 24 G Needle length: 9 cm Assessment Sensory level: T4 Events: CSF return

## 2023-02-15 NOTE — Evaluation (Signed)
Physical Therapy Evaluation Patient Details Name: Dennis Savage MRN: [id number] DOB: [birth year] Today's Date: 02/15/2023  History of Present Illness  Pt is a 89 y.o. male s/p L THA.  Pt has a past medical history of arthritis, CHF, COPD, gastric reflux, hypertension, dizziness, LBP, and PVD.   Clinical Impression  Pt received in Semi-Fowler's position and agreeable to therapy.  Pt reports to be starting to feel some pain upon arrival to the room, but is manageable at 4/10 pain.  Pt notes he has home support by living with his daughter and SIL.  Pt reported to be independent with cane use prior to hospital visit.  Pt mobilizes well, however needs HHA and heavy use of the bed rails in order to come into seated position while abiding by posterior hip precautions.  Pt also with minA needed for coming upright into standing position before ambulating.  Pt ambulated well with slo controlled mobility.  Pt noted to have some dizziness, so pt elected to return to the recliner.  Pt left in recliner with all needs met and call bell within reach.        If plan is discharge home, recommend the following: A little help with walking and/or transfers;A little help with bathing/dressing/bathroom;Assist for transportation;Help with stairs or ramp for entrance   Can travel by private vehicle        Equipment Recommendations None recommended by PT  Recommendations for Other Services       Functional Status Assessment Patient has had a recent decline in their functional status and demonstrates the ability to make significant improvements in function in a reasonable and predictable amount of time.     Precautions / Restrictions        Mobility  Bed Mobility Overal bed mobility: Needs Assistance Bed Mobility: Supine to Sit     Supine to sit: Min assist     General bed mobility comments: pt with heavy use of handrails and slow to get up to EOB while abiding by posterior hip precautions.  Utilized HHA  from therapist.    Transfers Overall transfer level: Needs assistance Equipment used: Rolling walker (2 wheels) Transfers: Sit to/from Stand Sit to Stand: Min assist           General transfer comment: MinA from lowered bed, would likely be able to do modI if bed was slightly raised.    Ambulation/Gait Ambulation/Gait assistance: Modified independent (Device/Increase time) Gait Distance (Feet): 24 Feet Assistive device: Rolling walker (2 wheels) Gait Pattern/deviations: Step-to pattern, Decreased stance time - left, Decreased step length - right Gait velocity: decreased     General Gait Details: Pt ambulated well with first attempt and noticed he was getting lightheaded at doorway, so elected to return to chair.  Stairs            Wheelchair Mobility     Tilt Bed    Modified Rankin (Stroke Patients Only)       Balance Overall balance assessment: Needs assistance Sitting-balance support: No upper extremity supported, Feet unsupported Sitting balance-Leahy Scale: Good     Standing balance support: Bilateral upper extremity supported, During functional activity, Reliant on assistive device for balance Standing balance-Leahy Scale: Fair                               Pertinent Vitals/Pain Pain Assessment Pain Assessment: 0-10 Pain Score: 4  Pain Location: L Hip Pain Descriptors / Indicators: Aching  Pain Intervention(s): Limited activity within patient's tolerance, Monitored during session, Premedicated before session, Repositioned    Home Living Family/patient expects to be discharged to:: Private residence Living Arrangements: Children;Other relatives Available Help at Discharge: Available PRN/intermittently Type of Home: House Home Access: Stairs to enter   Entrance Stairs-Number of Steps: 1   Home Layout: One level Home Equipment: Rollator (4 wheels);Cane - single point;Wheelchair - manual;Grab bars - tub/shower;Shower seat;Tub  bench;Toilet riser      Prior Function Prior Level of Function : Needs assist             Mobility Comments: uses cane or rollator at baseline ADLs Comments: does not drive anymore, daughter drives him, he is able to take care of self. Daughter will sometimes cook for him     Extremity/Trunk Assessment   Upper Extremity Assessment Upper Extremity Assessment: Overall WFL for tasks assessed    Lower Extremity Assessment Lower Extremity Assessment: Generalized weakness;LLE deficits/detail LLE Deficits / Details: s/p THA       Communication   Communication Communication: Hearing impairment  Cognition Arousal: Alert Behavior During Therapy: WFL for tasks assessed/performed Overall Cognitive Status: Within Functional Limits for tasks assessed                                          General Comments      Exercises Total Joint Exercises Ankle Circles/Pumps: AROM, Strengthening, Both, 10 reps, Supine Quad Sets: AROM, Strengthening, Both, 10 reps, Supine   Assessment/Plan    PT Assessment Patient needs continued PT services  PT Problem List Decreased strength;Decreased range of motion;Decreased activity tolerance;Decreased balance;Decreased mobility;Decreased safety awareness;Pain       PT Treatment Interventions DME instruction;Gait training;Functional mobility training;Therapeutic activities;Therapeutic exercise;Balance training;Neuromuscular re-education    PT Goals (Current goals can be found in the Care Plan section)  Acute Rehab PT Goals Patient Stated Goal: to get stronger and return home PT Goal Formulation: With patient Time For Goal Achievement: 02/15/23 Potential to Achieve Goals: Good    Frequency BID     Co-evaluation               AM-PAC PT "6 Clicks" Mobility  Outcome Measure Help needed turning from your back to your side while in a flat bed without using bedrails?: A Little Help needed moving from lying on your back to  sitting on the side of a flat bed without using bedrails?: A Little Help needed moving to and from a bed to a chair (including a wheelchair)?: A Little Help needed standing up from a chair using your arms (e.g., wheelchair or bedside chair)?: A Little Help needed to walk in hospital room?: A Little Help needed climbing 3-5 steps with a railing? : A Lot 6 Click Score: 17    End of Session Equipment Utilized During Treatment: Gait belt Activity Tolerance: Patient tolerated treatment well;Treatment limited secondary to medical complications (Comment) Patient left: in chair;with call bell/phone within reach;with chair alarm set;with family/visitor present Nurse Communication: Mobility status PT Visit Diagnosis: Unsteadiness on feet (R26.81);Other abnormalities of gait and mobility (R26.89);Muscle weakness (generalized) (M62.81);Difficulty in walking, not elsewhere classified (R26.2);Pain Pain - Right/Left: Left Pain - part of body: Hip    Time: 5621-3086 PT Time Calculation (min) (ACUTE ONLY): 46 min   Charges:   PT Evaluation $PT Eval Low Complexity: 1 Low PT Treatments $Therapeutic Activity: 23-37 mins PT General Charges $$  ACUTE PT VISIT: 1 Visit         Nolon Bussing, PT, DPT Physical Therapist - [facility]  02/15/23, 3:19 PM

## 2023-02-15 NOTE — Progress Notes (Addendum)
Patient notified that his relugolix (ORGOVYX) tablet 120 mg is not available here on formulary and that he would need to have family bring in to be labeled and dispensed by pharmacy. Per daughter patient ok to miss 3 doses. States she will bring if patient is not discharged tomorrow.

## 2023-02-15 NOTE — Interval H&P Note (Signed)
History and Physical Interval Note:  02/15/2023 6:26 AM  Dennis Savage  has presented today for surgery, with the diagnosis of PRIMARY OSTEOARTHRITIS OF LEFT HIP.Marland Kitchen  The various methods of treatment have been discussed with the patient and family. After consideration of risks, benefits and other options for treatment, the patient has consented to  Procedure(s): TOTAL HIP ARTHROPLASTY (Left) as a surgical intervention.  The patient's history has been reviewed, patient examined, no change in status, stable for surgery.  I have reviewed the patient's chart and labs.  Questions were answered to the patient's satisfaction.     Berry Godsey P Natahsa Marian

## 2023-02-15 NOTE — Progress Notes (Signed)
Patient is not able to walk the distance required to go the bathroom, or he/she is unable to safely negotiate stairs required to access the bathroom.  A 3in1 BSC will alleviate this problem   Dennis Savage, Jr. M.D.  

## 2023-02-15 NOTE — Transfer of Care (Signed)
Immediate Anesthesia Transfer of Care Note  Patient: Dennis Savage  Procedure(s) Performed: TOTAL HIP ARTHROPLASTY (Left: Hip)  Patient Location: PACU  Anesthesia Type:General  Level of Consciousness: awake, drowsy, and patient cooperative  Airway & Oxygen Therapy: Patient Spontanous Breathing and Patient connected to face mask oxygen  Post-op Assessment: Report given to RN and Post -op Vital signs reviewed and stable  Post vital signs: Reviewed and stable  Last Vitals:  Vitals Value Taken Time  BP 124/59 02/15/23 1118  Temp    Pulse 63 02/15/23 1121  Resp 27 02/15/23 1121  SpO2 100 % 02/15/23 1121  Vitals shown include unfiled device data.  Last Pain:  Vitals:   02/15/23 0620  TempSrc: Temporal  PainSc: 0-No pain         Complications: No notable events documented.

## 2023-02-15 NOTE — Op Note (Addendum)
OPERATIVE NOTE  DATE OF SURGERY:  02/15/2023  PATIENT NAME:  Dennis Savage   DOB: 06-16-1929  MRN: [id number]  PRE-OPERATIVE DIAGNOSIS: Degenerative arthrosis of the left hip, primary  POST-OPERATIVE DIAGNOSIS:  Same  PROCEDURE:  Left total hip arthroplasty  SURGEON:  Jena Gauss. M.D.  ASSISTANT:  Gean Birchwood, PA-C (present and scrubbed throughout the case, critical for assistance with exposure, retraction, instrumentation, and closure)  ANESTHESIA: spinal  ESTIMATED BLOOD LOSS: 75 mL  FLUIDS REPLACED: 1000 mL of crystalloid  DRAINS: 2 medium Hemovac drains  IMPLANTS UTILIZED: DePuy size 6 high offset Actis femoral stem, 58 mm OD Pinnacle 100 acetabular component, +4 mm in degree Pinnacle Marathon polyethylene insert, and a 36 mm M-SPEC +1.5 mm hip ball  INDICATIONS FOR SURGERY: Dennis Savage is a 89 y.o. year old male with a long history of progressive hip and groin  pain. X-rays demonstrated severe degenerative changes. The patient had not seen any significant improvement despite conservative nonsurgical intervention. After discussion of the risks and benefits of surgical intervention, the patient expressed understanding of the risks benefits and agree with plans for total hip arthroplasty.   The risks, benefits, and alternatives were discussed at length including but not limited to the risks of infection, bleeding, nerve injury, stiffness, blood clots, the need for revision surgery, limb length inequality, dislocation, cardiopulmonary complications, among others, and they were willing to proceed.  PROCEDURE IN DETAIL: The patient was brought into the operating room and, after adequate spinal anesthesia was achieved, the patient was placed in a right lateral decubitus position. Axillary roll was placed and all bony prominences were well-padded. The patient's left hip was cleaned and prepped with alcohol and DuraPrep and draped in the usual sterile fashion. A "timeout" was  performed as per usual protocol. A lateral curvilinear incision was made gently curving towards the posterior superior iliac spine. The IT band was incised in line with the skin incision and the fibers of the gluteus maximus were split in line. The piriformis tendon was identified, skeletonized, and incised at its insertion to the proximal femur and reflected posteriorly. A T type posterior capsulotomy was performed.  The gluteal sling was incised so as to better mobilize the proximal femur.  Prior to dislocation of the femoral head, a threaded Steinmann pin was inserted through a separate stab incision into the pelvis superior to the acetabulum and bent in the form of a stylus so as to assess limb length and hip offset throughout the procedure. The femoral head was then dislocated posteriorly. Inspection of the femoral head demonstrated severe degenerative changes with full-thickness loss of articular cartilage. The femoral neck cut was performed using an oscillating saw. The anterior capsule was elevated off of the femoral neck using a periosteal elevator. Attention was then directed to the acetabulum. The remnant of the labrum was excised using electrocautery. Inspection of the acetabulum also demonstrated significant degenerative changes. The acetabulum was reamed in sequential fashion up to a 57 mm diameter. Good punctate bleeding bone was encountered. A 58 mm Pinnacle 100 acetabular component was positioned and impacted into place. Good scratch fit was appreciated. A neutral polyethylene trial was inserted.  Attention was then directed to the proximal femur.  Femoral broaches were inserted in a sequential fashion up to a size 6 broach. Calcar region was planed and a trial reduction was performed using a standard offset neck and a 36 mm hip ball with a +1.5 mm neck length.  There was decreased  hip offset and only fair stability.  It was elected to trial with a high offset neck segment and replace the neutral  polyethylene trial with a +4 mm 10 degree polyethylene trial with the high side position at the 4 o'clock position.  Good equalization of limb lengths and hip offset was appreciated and excellent stability was noted both anteriorly and posteriorly. Trial components were removed. The acetabular shell was irrigated with copious amounts of normal saline with antibiotic solution and suctioned dry. A +4 mm 10 degree Pinnacle Marathon polyethylene insert was positioned with the high side at the 4 o'clock position and impacted into place. Next, a size 6 high offset Actis femoral stem was positioned and impacted into place. Excellent scratch fit was appreciated. A trial reduction was again performed with a 36 mm hip ball with a +1.5 mm neck length. Again, good equalization of limb lengths was appreciated and excellent stability appreciated both anteriorly and posteriorly. The hip was then dislocated and the trial hip ball was removed. The Morse taper was cleaned and dried. A 36 mm M-SPEC hip ball with a +1.5 mm neck length was placed on the trunnion and impacted into place. The hip was then reduced and placed through range of motion. Excellent stability was appreciated both anteriorly and posteriorly.  The wound was irrigated with copious amounts of normal saline followed by 450 ml of Surgiphor and suctioned dry. Good hemostasis was appreciated. The posterior capsulotomy was repaired using #5 Ethibond. Piriformis tendon was reapproximated to the undersurface of the gluteus medius tendon using #5 Ethibond.  The gluteal sling was repaired using interrupted sutures of #5 Ethibond.  The IT band was reapproximated using interrupted sutures of #1 Vicryl. Subcutaneous tissue was approximated using first #0 Vicryl followed by #2-0 Vicryl. The skin was closed with skin staples.  The patient tolerated the procedure well and was transported to the recovery room in stable condition.   Jena Gauss., M.D.

## 2023-02-15 NOTE — Anesthesia Preprocedure Evaluation (Signed)
Anesthesia Evaluation  Patient identified by MRN, date of birth, ID band Patient awake    Reviewed: Allergy & Precautions, NPO status , Patient's Chart, lab work & pertinent test results, reviewed documented beta blocker date and time   History of Anesthesia Complications Negative for: history of anesthetic complications  Airway Mallampati: II  TM Distance: >3 FB     Dental  (+) Upper Dentures, Lower Dentures, Edentulous Upper, Edentulous Lower   Pulmonary neg shortness of breath, sleep apnea , pneumonia, resolved, COPD, neg recent URI, former smoker   Pulmonary exam normal        Cardiovascular hypertension, Pt. on medications (-) angina + Peripheral Vascular Disease and +CHF  (-) Past MI and (-) Cardiac Stents Normal cardiovascular exam+ dysrhythmias Atrial Fibrillation + Valvular Problems/Murmurs      Neuro/Psych negative neurological ROS     GI/Hepatic Neg liver ROS,GERD  Controlled,,  Endo/Other  negative endocrine ROS    Renal/GU Renal disease     Musculoskeletal  (+) Arthritis ,    Abdominal   Peds  Hematology   Anesthesia Other Findings Past Medical History: 02/25/2015: Acute diarrhea 12/21/2011: After cataract not obscuring vision 12/08/2010: Anterior lid margin disease 07/13/2015: Apnea, sleep 06/15/2011: Arthralgia of multiple joints No date: Arthritis 12/08/2010: Artificial lens present No date: Atrial fibrillation (HCC) 11/03/2010: Benign essential HTN No date: CHF (congestive heart failure) (HCC) 11/03/2010: Chronic obstructive pulmonary disease (HCC) 06/15/2011: CN (constipation) 12/08/2010: Cornea disorder 02/25/2015: Dizziness No date: GERD (gastroesophageal reflux disease) No date: Heart murmur No date: Hypertension 10/13/2013: Interstitial lung disease (HCC) 11/03/2010: LBP (low back pain) 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: Peripheral vascular disease (HCC) 03/16/2014:  Retinal hemorrhage   Reproductive/Obstetrics                             Anesthesia Physical Anesthesia Plan  ASA: 3  Anesthesia Plan: Spinal   Post-op Pain Management:    Induction: Intravenous  PONV Risk Score and Plan: 1 and Propofol infusion and TIVA  Airway Management Planned: Natural Airway and Simple Face Mask  Additional Equipment:   Intra-op Plan:   Post-operative Plan:   Informed Consent: I have reviewed the patients History and Physical, chart, labs and discussed the procedure including the risks, benefits and alternatives for the proposed anesthesia with the patient or authorized representative who has indicated his/her understanding and acceptance.       Plan Discussed with: CRNA  Anesthesia Plan Comments:         Anesthesia Quick Evaluation

## 2023-02-15 NOTE — TOC Initial Note (Signed)
Transition of Care [facility]) - Initial/Assessment Note    Patient Details  Name: Dennis Savage MRN: [id number] Date of Birth: 15-Nov-1929  [facility]) CM/SW Contact:    Marlowe Sax, RN Phone Number: 02/15/2023, 3:48 PM  Clinical Narrative:                 Met with the patient and discussed DC plan and needs, he lives at home with family, his daughter helps him some, he does have DME and does not need additional He is set up with [facility] services prior to surgery   Expected Discharge Plan: Home w Home Health Services Barriers to Discharge: No Barriers Identified   Patient Goals and CMS Choice            Expected Discharge Plan and Services   Discharge Planning Services: CM Consult   Living arrangements for the past 2 months: Single Family Home                 DME Arranged: N/A DME Agency: NA       HH Arranged: PT, OT HH Agency: [facility] Date HH Agency Contacted: 02/15/23 Time HH Agency Contacted: [phone] Representative spoke with at [facility] Agency: [place]  Prior Living Arrangements/Services Living arrangements for the past 2 months: Single Family Home Lives with:: Adult Children, Relatives Patient language and need for interpreter reviewed:: Yes Do you feel safe going back to the place where you live?: Yes      Need for Family Participation in Patient Care: Yes (Comment) Care giver support system in place?: Yes (comment) Current home services: DME (Rollator, Wheelchair, Rolling walker, shower seat, 3 in1) Criminal Activity/Legal Involvement Pertinent to Current Situation/Hospitalization: No - Comment as needed  Activities of Daily Living      Permission Sought/Granted   Permission granted to share information with : Yes, Verbal Permission Granted              Emotional Assessment Appearance:: Appears stated age Attitude/Demeanor/Rapport: Engaged Affect (typically observed): Accepting, Pleasant Orientation: : Oriented  to Self, Oriented to Place, Oriented to  Time, Oriented to Situation Alcohol / Substance Use: Not Applicable Psych Involvement: No (comment)  Admission diagnosis:  Total knee replacement status [Z96.659] Patient Active Problem List   Diagnosis Date Noted   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   Exposure to potentially hazardous substance 01/13/2023   Sepsis due to pneumonia (HCC) 10/28/2022   Essential hypertension 10/28/2022   Paroxysmal atrial fibrillation (HCC) 10/28/2022   GERD without esophagitis 10/28/2022   Chronic diastolic CHF (congestive heart failure) (HCC) 10/28/2022   Chronic obstructive pulmonary disease (COPD) (HCC) 10/28/2022   Acute respiratory failure with hypoxia (HCC) 10/28/2022   Weakness 10/28/2022   Obesity (BMI 30-39.9) 06/21/2021   Pleural effusion on left 06/19/2021   CAP (community acquired pneumonia) 06/18/2021   Acute on chronic diastolic CHF (congestive heart failure) (HCC) 06/18/2021   Adrenal mass, left (HCC) 06/18/2021   Elevated troponin 06/18/2021   CHF (congestive heart failure) (HCC) 06/17/2021   Acute kidney injury superimposed on CKD (HCC) 06/17/2021   Acute CHF (congestive heart failure) (HCC) 06/17/2021   S/P total hip arthroplasty 10/12/2015   Degenerative arthritis of hip 09/12/2015   Atrial fibrillation (HCC) 07/13/2015   Apnea, sleep 07/13/2015   Acute diarrhea 02/25/2015   Dizziness 02/25/2015   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, 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   Retinal hemorrhage 03/16/2014   Interstitial lung disease (HCC) 10/13/2013   After cataract  not obscuring vision 12/21/2011   History of surgical procedure 12/21/2011   Constipation 06/15/2011   Encounter for general adult medical examination without abnormal findings 06/15/2011   Arthralgia of multiple joints 06/15/2011   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   Artificial lens present 12/08/2010   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   Benign essential HTN 11/03/2010    COPD with exacerbation (HCC) 11/03/2010   Benign hypertension 11/03/2010   Low back pain 11/03/2010   Peripheral vascular disease (HCC) 11/03/2010   Current tobacco use 11/03/2010   PCP:  Gracelyn Nurse, MD Pharmacy:   [facility] [address] ([address]), [place] - [address] [place] ([place]) [place] [address] Phone: [phone] Fax: [phone]  [facility] - [place], [place] - [address] [place] [address] Phone: [phone] Fax: [phone]  [facility] [place], [place] - [address] [phone] [facility] [address] [place] [address] Phone: [phone] Fax: [phone]     Social Determinants of Health (SDOH) Social History: SDOH Screenings   Food Insecurity: No Food Insecurity (02/01/2023)   Received from [facility]  Housing: Low Risk  (10/28/2022)  Transportation Needs: No Transportation Needs (02/01/2023)   Received from [facility]  Utilities: Not At Risk (02/01/2023)   Received from [facility]  Depression [phone]): Low Risk  (06/30/2021)  Financial Resource Strain: Low Risk  (02/01/2023)   Received from [facility]  Tobacco Use: Medium Risk (02/15/2023)   SDOH Interventions:     Readmission Risk Interventions     No data to display

## 2023-02-15 NOTE — Plan of Care (Signed)
  Problem: Fluid Volume: Goal: Hemodynamic stability will improve Outcome: Progressing   Problem: Respiratory: Goal: Ability to maintain adequate ventilation will improve Outcome: Progressing   Problem: Education: Goal: Knowledge of General Education information will improve Description: Including pain rating scale, medication(s)/side effects and non-pharmacologic comfort measures Outcome: Progressing   Problem: Activity: Goal: Risk for activity intolerance will decrease Outcome: Progressing   Problem: Nutrition: Goal: Adequate nutrition will be maintained Outcome: Progressing   Problem: Coping: Goal: Level of anxiety will decrease Outcome: Progressing   Problem: Pain Management: Goal: General experience of comfort will improve Outcome: Progressing   Problem: Safety: Goal: Ability to remain free from injury will improve Outcome: Progressing   Problem: Skin Integrity: Goal: Risk for impaired skin integrity will decrease Outcome: Progressing   Problem: Activity: Goal: Ability to avoid complications of mobility impairment will improve Outcome: Progressing   Problem: Skin Integrity: Goal: Will show signs of wound healing Outcome: Progressing

## 2023-02-16 DIAGNOSIS — M1612 Unilateral primary osteoarthritis, left hip: Secondary | ICD-10-CM | POA: Diagnosis not present

## 2023-02-16 MED ORDER — OXYCODONE HCL 5 MG PO TABS: 5.0000 mg | ORAL_TABLET | ORAL | 0 refills | Status: DC | PRN

## 2023-02-16 MED ORDER — CELECOXIB 200 MG PO CAPS: 200.0000 mg | ORAL_CAPSULE | Freq: Two times a day (BID) | ORAL | 1 refills | Status: DC

## 2023-02-16 MED ORDER — TRAMADOL HCL 50 MG PO TABS: 50.0000 mg | ORAL_TABLET | ORAL | 0 refills | Status: DC | PRN

## 2023-02-16 NOTE — Progress Notes (Signed)
Physical Therapy Treatment Patient Details Name: Dennis Savage MRN: [id number] DOB: 16-Dec-1929 Today's Date: 02/16/2023   History of Present Illness Pt is a 89 y.o. male s/p L THA.  Pt has a past medical history of arthritis, CHF, COPD, gastric reflux, hypertension, dizziness, LBP, and PVD.    PT Comments  Pt was long sitting in bed upon arrival. He remains A and O x 4 and was able to recall 3/3 hip precautions. Acquired and placed hip abduction pillow at conclusion of session. Pt also issued HEP handout for reminders and to promote exercise throughout the day. Pt is most concerned about his abilities to get in/out of bed I'ly. Session focused on bed mobility. First attempt pt required min assist. 2nd attempt CGA only. Did discuss option with pt of sleeping in recliner until completely independent with getting out/in bed. Once pt is sitting EOB, he does not require assistance to stand or ambulate > 200 ft with RW. Pt is doing well overall. Planned DC home in the morning when daughter is available to assist him + give him a ride. Recommend pt continue to work with skilled PT at DC to maximize independence and overall safety with all ADLs.     If plan is discharge home, recommend the following: A little help with walking and/or transfers;A little help with bathing/dressing/bathroom;Assist for transportation;Help with stairs or ramp for entrance     Equipment Recommendations  None recommended by PT (pt endorses having all equipment needs met)       Precautions / Restrictions Precautions Precautions: Posterior Hip Precaution Booklet Issued: Yes (comment) Restrictions Weight Bearing Restrictions: Yes LLE Weight Bearing: Weight bearing as tolerated     Mobility  Bed Mobility Overal bed mobility: Needs Assistance Bed Mobility: Rolling, Sidelying to Sit, Supine to Sit, Sit to Supine, Sit to Sidelying Rolling: Supervision, Used rails Sidelying to sit: Min assist, Contact guard assist Supine to  sit: Contact guard, Min assist Sit to supine: Contact guard assist, Min assist Sit to sidelying: Contact guard assist, Min assist General bed mobility comments: Pt most concerned with being able to independently get in/out of bed at DC. discussed option of sleeping in recliner until bed mobility improves. This session focused on imporving pt's abilities to get out/in bed. first attempt, pt rrequired min assist. 2nd attempt pt only required CGA for tactle cues. Improved from earlier session in the day    Transfers Overall transfer level: Needs assistance Equipment used: Rolling walker (2 wheels) Transfers: Sit to/from Stand Sit to Stand: Supervision  General transfer comment: Pt was able to stand without physical assistance. He did however require Vcs for handplacement and technique improvements    Ambulation/Gait Ambulation/Gait assistance: Supervision Gait Distance (Feet): 200 Feet Assistive device: Rolling walker (2 wheels) Gait Pattern/deviations: Step-through pattern, Antalgic, Trunk flexed Gait velocity: decreased  General Gait Details: pt demonstrates safe abilities to ambulate 200 ft without LOB. vcs for posture correction only   Stairs Stairs: Yes Stairs assistance: Contact guard assist Stair Management: Step to pattern, Forwards, With walker, No rails Number of Stairs: 1 General stair comments: Author offer to pt to attempt single step performance again this afternoon like in earlier session." I feel good about the step, Im just worried about getting in/out of bed by myself."      Balance Overall balance assessment: Modified Independent Sitting-balance support: Feet supported Sitting balance-Leahy Scale: Good     Standing balance support: Bilateral upper extremity supported, During functional activity, Reliant on assistive device for  balance Standing balance-Leahy Scale: Good Standing balance comment: no LOB or balance concerns with use of RW       Cognition  Arousal: Alert Behavior During Therapy: WFL for tasks assessed/performed Overall Cognitive Status: Within Functional Limits for tasks assessed    General Comments: Pt is A and O x 4. was able to recall 3/3 hip precautions during PM session           General Comments General comments (skin integrity, edema, etc.): aquired and then applied hip abduction pillow per protocols. Also applied ice at conclusion of session      Pertinent Vitals/Pain Pain Assessment Pain Assessment: No/denies pain Pain Score: 2  Pain Location: left hip Pain Descriptors / Indicators: Aching Pain Intervention(s): Limited activity within patient's tolerance, Monitored during session, Premedicated before session, Repositioned, Ice applied    Home Living Family/patient expects to be discharged to:: Private residence Living Arrangements: Children Available Help at Discharge: Available PRN/intermittently Type of Home: House Home Access: Stairs to enter   Secretary/administrator of Steps: 1   Home Layout: One level Home Equipment: Rollator (4 wheels);Cane - single point;Wheelchair - manual;Grab bars - tub/shower;Shower seat;Tub bench;Toilet riser      Prior Function            PT Goals (current goals can now be found in the care plan section) Acute Rehab PT Goals Patient Stated Goal: go home when ready Progress towards PT goals: Progressing toward goals    Frequency    BID       AM-PAC PT "6 Clicks" Mobility   Outcome Measure  Help needed turning from your back to your side while in a flat bed without using bedrails?: A Little Help needed moving from lying on your back to sitting on the side of a flat bed without using bedrails?: A Little Help needed moving to and from a bed to a chair (including a wheelchair)?: A Little Help needed standing up from a chair using your arms (e.g., wheelchair or bedside chair)?: A Little Help needed to walk in hospital room?: A Little Help needed climbing 3-5  steps with a railing? : A Little 6 Click Score: 18    End of Session   Activity Tolerance: Patient tolerated treatment well Patient left: in chair;with call bell/phone within reach;with chair alarm set;with family/visitor present Nurse Communication: Mobility status PT Visit Diagnosis: Unsteadiness on feet (R26.81);Other abnormalities of gait and mobility (R26.89);Muscle weakness (generalized) (M62.81);Difficulty in walking, not elsewhere classified (R26.2);Pain Pain - Right/Left: Left Pain - part of body: Hip     Time: 1610-9604 PT Time Calculation (min) (ACUTE ONLY): 27 min  Charges:    $Gait Training: 8-22 mins $Therapeutic Activity: 8-22 mins PT General Charges $$ ACUTE PT VISIT: 1 Visit                    Jetta Lout PTA 02/16/23, 3:38 PM

## 2023-02-16 NOTE — TOC Transition Note (Addendum)
Transition of Care [facility]) - CM/SW Discharge Note   Patient Details  Name: Dennis Savage MRN: [id number] Date of Birth: 1930-01-26  Transition of Care [facility]) CM/SW Contact:  Luvenia Redden, RN Phone Number: 02/16/2023, 9:25 AM   Clinical Narrative:     Staff attempted to verify assistance for pt getting in and out of bed  once pt returns home with daughter however unable to confirmed delaying discharge. TOC has notified [facility]) of this delay on pt's discharge. Will update accordingly.  [facility] Sickle) [phone].  09:58 AM-TOC RN has attempted to contact pt's daughter Sedalia Muta, left a HIPAA voice message requesting a call back to an available number.   12:50 PM-Daughter [name] returned call and requested to pick pt up in the AM at 9:00 AM. She indicated she has assistance for pt with her and her spouse starting tomorrow. She has requested meds be called in today for pick up tomorrow with no delays due to her work schedule. She may come alittle earlier to pick pt up in the morning. RN TOC has alerted [facility] on pt's pending discharge tomorrow 9:00 AM.  Final next level of care: Home w Home Health Services Barriers to Discharge: No Barriers Identified   Patient Goals and CMS Choice      Discharge Placement                         Discharge Plan and Services Additional resources added to the After Visit Summary for     Discharge Planning Services: CM Consult            DME Arranged: N/A DME Agency: NA       HH Arranged: PT, OT HH Agency: [facility] Date HH Agency Contacted: 02/15/23 Time HH Agency Contacted: 1547 Representative spoke with at [facility] Agency: [place]  Social Determinants of Health (SDOH) Interventions SDOH Screenings   Food Insecurity: No Food Insecurity (02/01/2023)   Received from [facility]  Housing: Low Risk  (10/28/2022)  Transportation Needs: No Transportation Needs (02/01/2023)   Received  from [facility]  Utilities: Not At Risk (02/01/2023)   Received from [facility]  Depression [phone]): Low Risk  (06/30/2021)  Financial Resource Strain: Low Risk  (02/01/2023)   Received from [facility]  Tobacco Use: Medium Risk (02/15/2023)     Readmission Risk Interventions     No data to display

## 2023-02-16 NOTE — Evaluation (Addendum)
Occupational Therapy Evaluation Patient Details Name: Dennis Savage MRN: [id number] DOB: February 04, 1930 Today's Date: 02/16/2023   History of Present Illness Pt is a 89 y.o. male s/p L THA.  Pt has a past medical history of arthritis, CHF, COPD, gastric reflux, hypertension, dizziness, LBP, and PVD.   Clinical Impression   Pt. presents with pain in the left hip, posterior hip precautions, limited activity tolerance, and limited functional mobility which hinders his ability to complete basic ADL and IADL functioning. Pt. resides at home with his daughter, and son-in-law. Pt. was independent with ADLs, has assist with meals, and does not drive 2/2 to vision impairments. Pt. reports 4/10 pain at rest, and 9/10 pain in the left hip with movement. Pt. was assisted back to bed per his request. Pt. Requires CGA to transfer from the recliner to bed, and requires minA to for LEs when returning to supine from the EOB.  Pt. Requires Min-ModA assist for LE ADLs using A/E 2/2 posterior hip precautions. Pt. Is able to recall 3/3 hip precautions, and requires cues  to maintain them when engaging in tasks. Pt. Has, and uses A/E home for LE ADLs. Pt. will benefit from OT services for ADL training, A/E training, and pt. Education about posterior hip preca home modification, and DME.       If plan is discharge home, recommend the following: A lot of help with bathing/dressing/bathroom;A little help with walking and/or transfers;Assistance with cooking/housework    Functional Status Assessment     Equipment Recommendations       Recommendations for Other Services       Precautions / Restrictions Precautions Precautions: Posterior Hip Restrictions Weight Bearing Restrictions: Yes LLE Weight Bearing: Weight bearing as tolerated      Mobility Bed Mobility Overal bed mobility: Needs Assistance Bed Mobility: Sit to Supine     Supine to sit: Min assist          Transfers Overall transfer level: Needs  assistance Equipment used: Rolling walker (2 wheels) Transfers: Sit to/from Stand Sit to Stand: Contact guard assist                  Balance             Standing balance-Leahy Scale: Good Standing balance comment: No LOB during the transfer                           ADL either performed or assessed with clinical judgement   ADL Overall ADL's : Needs assistance/impaired             Lower Body Bathing: Min-Moderate assistance   Upper Body Dressing : Minimal assistance   Lower Body Dressing: Min- Moderate assistance Lower Body Dressing Details (indicate cue type and reason): 2/2 Posterior hip precautions             Functional mobility during ADLs: Contact guard assist       Vision Patient Visual Report: No change from baseline- legally blind in the right eye, and receives regular injections in the left eye.       Perception         Praxis         Pertinent Vitals/Pain Pain Assessment Pain Assessment: 0-10 Pain Score: 4  (9/10 with movement) Pain Location: left hip Pain Descriptors / Indicators: Aching Pain Intervention(s): Limited activity within patient's tolerance, Monitored during session, Premedicated before session, Repositioned     Extremity/Trunk Assessment Upper  Extremity Assessment Upper Extremity Assessment: Overall WFL for tasks assessed   Lower Extremity Assessment Lower Extremity Assessment: Generalized weakness       Communication Communication Communication: Hearing impairment   Cognition Arousal: Alert Behavior During Therapy: WFL for tasks assessed/performed Overall Cognitive Status: Within Functional Limits for tasks assessed                                       General Comments       Exercises     Shoulder Instructions      Home Living Family/patient expects to be discharged to:: Private residence Living Arrangements: Children Available Help at Discharge: Available  PRN/intermittently Type of Home: House Home Access: Stairs to enter [employer] of Steps: 1   Home Layout: One level     Bathroom Shower/Tub: Chief Strategy Officer: Standard     Home Equipment: Rollator (4 wheels);Cane - single point;Wheelchair - manual;Grab bars - tub/shower;Shower seat;Tub bench;Toilet riser          Prior Functioning/Environment Prior Level of Function : Needs assist               ADLs Comments: does not drive anymore, daughter drives him, he is able to take care of self. Daughter will sometimes cook for him        OT Problem List: Decreased strength      OT Treatment/Interventions:      OT Goals(Current goals can be found in the care plan section) Acute Rehab OT Goals Patient Stated Goal: To be able to get up, and get around OT Goal Formulation: With patient Time For Goal Achievement: 03/02/23 Potential to Achieve Goals: Good  OT Frequency: Min 2X/week    Co-evaluation              AM-PAC OT "6 Clicks" Daily Activity     Outcome Measure Help from another person eating meals?: None Help from another person taking care of personal grooming?: A Little Help from another person toileting, which includes using toliet, bedpan, or urinal?: A Little Help from another person bathing (including washing, rinsing, drying)?: A Lot Help from another person to put on and taking off regular upper body clothing?: A Little Help from another person to put on and taking off regular lower body clothing?: A Lot 6 Click Score: 17   End of Session Equipment Utilized During Treatment: Gait belt  Activity Tolerance: Patient tolerated treatment well Patient left: in bed  OT Visit Diagnosis: Unsteadiness on feet (R26.81)                Time: 4098-1191 OT Time Calculation (min): 33 min Charges:  OT General Charges $OT Visit: 1 Visit OT Evaluation $OT Eval Moderate Complexity: 1 Mod Dennis Messier, MS, OTR/L  Dennis Savage 02/16/2023, 12:06 PM

## 2023-02-16 NOTE — Discharge Summary (Cosign Needed Addendum)
Physician Discharge Summary  Subjective: 2 Days Post-Op Procedure(s) (LRB): TOTAL HIP ARTHROPLASTY (Left) Patient reports pain as mild.   Patient seen in rounds with Dr. Audelia Acton. Patient is well, and has had no acute complaints or problems Patient is ready to go home with home health physical therapy  Physician Discharge Summary  Patient ID: Dennis Savage MRN: [id number] DOB/AGE: 87/28/1931 87 y.o.  Admit date: 02/15/2023 Discharge date: 02/17/2023  Admission Diagnoses:  Discharge Diagnoses:  Principal Problem:   Total knee replacement status   Discharged Condition: fair  Hospital Course: The patient is postop day 1 from a left total hip arthroplasty.  He is doing well with his pain management.  He is able to roll over comfortably.  He has gotten up to the bathroom with 1 assist.  The patient worked with physical therapy yesterday and ambulated over 200 feet.  We did remove his Hemovac drain yesterday morning.  His vitals are stable.  His pain is manageable.  Treatments: surgery:    Left total hip arthroplasty   SURGEON:  Jena Gauss. M.D.   ASSISTANT:  Gean Birchwood, PA-C (present and scrubbed throughout the case, critical for assistance with exposure, retraction, instrumentation, and closure)   ANESTHESIA: spinal   ESTIMATED BLOOD LOSS: 75 mL   FLUIDS REPLACED: 1000 mL of crystalloid   DRAINS: 2 medium Hemovac drains   IMPLANTS UTILIZED: DePuy size 6 high offset Actis femoral stem, 58 mm OD Pinnacle 100 acetabular component, +4 mm in degree Pinnacle Marathon polyethylene insert, and a 36 mm M-SPEC +1.5 mm hip ball  Discharge Exam: Blood pressure (!) 131/50, pulse (!) 59, temperature 98.2 F (36.8 C), temperature source Oral, resp. rate 18, height 5' 9.5" (1.765 m), weight 87.5 kg, SpO2 95%.   Disposition: Discharge disposition: 01-Home or Self Care        Allergies as of 02/17/2023       Reactions   Lisinopril Cough   Penicillins Hives, Swelling    IgE = 17 ((WNL) on 02/08/2023        Medication List     TAKE these medications    acetaminophen 325 MG tablet Commonly known as: TYLENOL Take 650 mg by mouth every 6 (six) hours as needed.   albuterol 108 (90 Base) MCG/ACT inhaler Commonly known as: VENTOLIN HFA Inhale 2 puffs into the lungs every 6 (six) hours as needed for wheezing or shortness of breath.   amLODipine 10 MG tablet Commonly known as: NORVASC Take 10 mg by mouth daily.   CALCIUM 600 + D PO Take 1 tablet by mouth daily.   celecoxib 200 MG capsule Commonly known as: CELEBREX Take 1 capsule (200 mg total) by mouth 2 (two) times daily.   chlorthalidone 25 MG tablet Commonly known as: HYGROTON Take 25 mg by mouth 2 (two) times a week. Patient takes only on Sunday and Wednesday   cholecalciferol 25 MCG (1000 UNIT) tablet Commonly known as: VITAMIN D3 Take 1,000 Units by mouth daily.   docusate sodium 100 MG capsule Commonly known as: COLACE Take 100 mg by mouth 2 (two) times daily.   doxycycline 100 MG tablet Commonly known as: VIBRA-TABS Take 100 mg by mouth 2 (two) times daily.   Fish Oil 300 MG Caps Take 300 mg by mouth daily.   omeprazole 20 MG capsule Commonly known as: PRILOSEC Take 20 mg by mouth daily.   Orgovyx 120 MG tablet Generic drug: relugolix TAKE 1 TABLET BY MOUTH ONCE DAILY. What changed:  how much to take when to take this   oxyCODONE 5 MG immediate release tablet Commonly known as: Oxy IR/ROXICODONE Take 1 tablet (5 mg total) by mouth every 4 (four) hours as needed for moderate pain (pain score 4-6) (pain score 4-6).   PRESERVISION AREDS 2 PO Take 1 capsule by mouth 2 (two) times daily.   traMADol 50 MG tablet Commonly known as: ULTRAM Take 50 mg by mouth 2 (two) times daily as needed for moderate pain (pain score 4-6). What changed: Another medication with the same name was added. Make sure you understand how and when to take each.   traMADol 50 MG tablet Commonly  known as: ULTRAM Take 1-2 tablets (50-100 mg total) by mouth every 4 (four) hours as needed for moderate pain (pain score 4-6). What changed: You were already taking a medication with the same name, and this prescription was added. Make sure you understand how and when to take each.   Trelegy Ellipta 100-62.5-25 MCG/ACT Aepb Generic drug: Fluticasone-Umeclidin-Vilant Inhale 1 Inhalation into the lungs at bedtime.   Xarelto 20 MG Tabs tablet Generic drug: rivaroxaban Take 1 tablet by mouth daily with supper.               Durable Medical Equipment  (From admission, onward)           Start     Ordered   02/15/23 1245  DME Walker rolling  Once       Question:  Patient needs a walker to treat with the following condition  Answer:  S/P total hip arthroplasty   02/15/23 1244   02/15/23 1245  DME Bedside commode  Once       Comments: Patient is not able to walk the distance required to go the bathroom, or he/she is unable to safely negotiate stairs required to access the bathroom.  A 3in1 BSC will alleviate this problem  Question:  Patient needs a bedside commode to treat with the following condition  Answer:  S/P total hip arthroplasty   02/15/23 1244            Follow-up Information     Hooten, Illene Labrador, MD Follow up on 04/02/2023.   Specialty: Orthopedic Surgery Why: at 2:45pm Contact information: [address] [facility] [place] [address] [phone]                 Signed: Lenard Forth, Rafaela Dinius 02/17/2023, 6:50 AM   Objective: Vital signs in last 24 hours: Temp:  [98.2 F (36.8 C)-99.4 F (37.4 C)] 98.2 F (36.8 C) (11/02 2225) Pulse Rate:  [53-59] 59 (11/02 2225) Resp:  [18] 18 (11/02 2225) BP: (117-131)/(50-56) 131/50 (11/02 2225) SpO2:  [94 %-97 %] 95 % (11/02 2225)  Intake/Output from previous day:  Intake/Output Summary (Last 24 hours) at 02/17/2023 0650 Last data filed at 02/17/2023 0612 Gross per 24 hour  Intake 120 ml   Output 1150 ml  Net -1030 ml    Intake/Output this shift: Total I/O In: -  Out: 1150 [Urine:1150]  Labs: No results for input(s): "HGB" in the last 72 hours. No results for input(s): "WBC", "RBC", "HCT", "PLT" in the last 72 hours. No results for input(s): "NA", "K", "CL", "CO2", "BUN", "CREATININE", "GLUCOSE", "CALCIUM" in the last 72 hours. No results for input(s): "LABPT", "INR" in the last 72 hours.  EXAM: General - Patient is Alert and Oriented Extremity - Neurovascular intact Sensation intact distally Dorsiflexion/Plantar flexion intact Compartment soft Incision - clean, dry,  with the Hemovac tubing removed with no complication. Motor Function -plantarflexion and dorsiflexion intact.  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   Apnea, sleep 07/13/2015   Arthralgia of multiple joints 06/15/2011   Arthritis    Artificial lens present 12/08/2010   Atrial fibrillation (HCC)    Benign essential HTN 11/03/2010   CHF (congestive heart failure) (HCC)    Chronic obstructive pulmonary disease (HCC) 11/03/2010   CN (constipation) 06/15/2011   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   Weight as of this encounter: 87.5 kg. Advance diet Up with therapy D/C IV  fluids Discharge home with home health Diet - Regular diet Follow up - in 6 weeks Activity - WBAT Disposition - Home Condition Upon Discharge - Stable DVT Prophylaxis - TED hose and Xarelto  Dedra Skeens, PA-C Orthopaedic Surgery 02/17/2023, 6:50 AM

## 2023-02-16 NOTE — Anesthesia Postprocedure Evaluation (Signed)
Anesthesia Post Note  Patient: Dennis Savage  Procedure(s) Performed: TOTAL HIP ARTHROPLASTY (Left: Hip)  Patient location during evaluation: Nursing Unit Anesthesia Type: Spinal Level of consciousness: oriented and awake and alert Pain management: pain level controlled Vital Signs Assessment: post-procedure vital signs reviewed and stable Respiratory status: spontaneous breathing and respiratory function stable Cardiovascular status: blood pressure returned to baseline and stable Postop Assessment: no headache, no backache, no apparent nausea or vomiting and patient able to bend at knees Anesthetic complications: no   No notable events documented.   Last Vitals:  Vitals:   02/16/23 0429 02/16/23 0804  BP: 112/64 (!) 118/56  Pulse: 61 (!) 53  Resp: 18 18  Temp: 36.6 C 37.4 C  SpO2: 94% 97%    Last Pain:  Vitals:   02/16/23 0429  TempSrc: Oral  PainSc:                  Louie Boston

## 2023-02-16 NOTE — Progress Notes (Signed)
Physical Therapy Treatment Patient Details Name: Dennis Savage MRN: [id number] DOB: [birth year] Today's Date: 02/16/2023   History of Present Illness Pt is a 89 y.o. male s/p L THA.  Pt has a past medical history of arthritis, CHF, COPD, gastric reflux, hypertension, dizziness, LBP, and PVD.    PT Comments  Pt was long sitting in bed with pillows between knees to prevent crossing legs. Pt is A and O x 4. Cooperative and motivated however endorses some anxiety about Dcing home. Reviewed hip precautions. He endorses living with daughter who still works full time/12 hour shifts. Author attempted to call daughter post session to confirm available assistance at DC. Left VM for return call. Pt's other daughter [name]) stated she is unable to help. Pt requires a little assistance still to exit bed. CGA for standing from EOB and from recliner height surface.He tolerated ambulation ~ 120 ft without difficulty or LOB.Also safely demonstrates abilities to perform 1 step to simulate home entry. Pt will continue to benefit form skilled PT at DC to maximize his independence and safety with all ADLs. Care team aware of DC concerns. Will return for BID session later this date.      If plan is discharge home, recommend the following: A little help with walking and/or transfers;A little help with bathing/dressing/bathroom;Assist for transportation;Help with stairs or ramp for entrance     Equipment Recommendations  None recommended by PT       Precautions / Restrictions Precautions Precautions: Posterior Hip Precaution Booklet Issued: Yes (comment) Restrictions Weight Bearing Restrictions: Yes LLE Weight Bearing: Weight bearing as tolerated     Mobility  Bed Mobility Overal bed mobility: Needs Assistance Bed Mobility: Supine to Sit  Supine to sit: Min assist  General bed mobility comments: pt with heavy use of handrails and slow to get up to EOB while abiding by posterior hip precautions.  Utilized HHA  from therapist.    Transfers Overall transfer level: Needs assistance Equipment used: Rolling walker (2 wheels) Transfers: Sit to/from Stand Sit to Stand: Contact guard assist  General transfer comment: CGA for safety with vcs for technique improvements. pt did stand form EOB and from recliner this session. endorses having tall toilet at home with handrails + a recliner    Ambulation/Gait Ambulation/Gait assistance: Supervision Gait Distance (Feet): 120 Feet Assistive device: Rolling walker (2 wheels) Gait Pattern/deviations: Step-through pattern, Antalgic, Trunk flexed Gait velocity: decreased  General Gait Details: Pt was able to ambulate~ 120 ft with RW + supervision. no LOB or safety concerns during ambulation with RW   Stairs Stairs: Yes Stairs assistance: Contact guard assist Stair Management: Step to pattern, Forwards, With walker, No rails Number of Stairs: 1 General stair comments: Pt performed ascending/descending 1 step to simlulate home entry. pt was able to perform with proper sequencing + CGA for additional safety    Balance Overall balance assessment: Needs assistance Sitting-balance support: Feet supported Sitting balance-Leahy Scale: Good     Standing balance support: Bilateral upper extremity supported, During functional activity, Reliant on assistive device for balance Standing balance-Leahy Scale: Good Standing balance comment: no LOB with use of RW. encouraged pt to use RW at all times at DC       Cognition Arousal: Alert Behavior During Therapy: [facility] for tasks assessed/performed Overall Cognitive Status: Within Functional Limits for tasks assessed      General Comments: Pt is A and O but concerned about DCing home           General  Comments General comments (skin integrity, edema, etc.): Reviewed precautions and importance of routine mobility      Pertinent Vitals/Pain Pain Assessment Pain Assessment: 0-10 Pain Score: 4  Pain Location: L  Hip Pain Descriptors / Indicators: Aching Pain Intervention(s): Limited activity within patient's tolerance, Monitored during session, Premedicated before session, Repositioned    Home Living Family/patient expects to be discharged to:: Private residence Living Arrangements: Children              PT Goals (current goals can now be found in the care plan section) Acute Rehab PT Goals Patient Stated Goal: Be able to do everything for myself Progress towards PT goals: Progressing toward goals    Frequency    BID       AM-PAC PT "6 Clicks" Mobility   Outcome Measure  Help needed turning from your back to your side while in a flat bed without using bedrails?: A Little Help needed moving from lying on your back to sitting on the side of a flat bed without using bedrails?: A Little Help needed moving to and from a bed to a chair (including a wheelchair)?: A Little Help needed standing up from a chair using your arms (e.g., wheelchair or bedside chair)?: A Little Help needed to walk in hospital room?: A Little Help needed climbing 3-5 steps with a railing? : A Little 6 Click Score: 18    End of Session   Activity Tolerance: Patient tolerated treatment well;Patient limited by fatigue Patient left: in chair;with call bell/phone within reach;with chair alarm set;with family/visitor present Nurse Communication: Mobility status PT Visit Diagnosis: Unsteadiness on feet (R26.81);Other abnormalities of gait and mobility (R26.89);Muscle weakness (generalized) (M62.81);Difficulty in walking, not elsewhere classified (R26.2);Pain Pain - Right/Left: Left Pain - part of body: Hip     Time: 0722-0758 PT Time Calculation (min) (ACUTE ONLY): 36 min  Charges:    $Gait Training: 8-22 mins $Therapeutic Activity: 8-22 mins PT General Charges $$ ACUTE PT VISIT: 1 Visit                    Jetta Lout PTA 02/16/23, 8:08 AM

## 2023-02-16 NOTE — Progress Notes (Signed)
  Subjective: 1 Day Post-Op Procedure(s) (LRB): TOTAL HIP ARTHROPLASTY (Left) Patient reports pain as mild.   Patient is well, and has had no acute complaints or problems Plan is to go Home vs Rehab after hospital stay. Negative for chest pain and shortness of breath Fever: no Gastrointestinal:Negative for nausea and vomiting  Objective: Vital signs in last 24 hours: Temp:  [97.2 F (36.2 C)-98 F (36.7 C)] 97.9 F (36.6 C) (11/02 0429) Pulse Rate:  [42-64] 61 (11/02 0429) Resp:  [15-25] 18 (11/02 0429) BP: (112-161)/(51-84) 112/64 (11/02 0429) SpO2:  [92 %-100 %] 94 % (11/02 0429) Weight:  [87.5 kg] 87.5 kg (11/01 0620)  Intake/Output from previous day:  Intake/Output Summary (Last 24 hours) at 02/16/2023 0615 Last data filed at 02/16/2023 0019 Gross per 24 hour  Intake 2371.67 ml  Output 285 ml  Net 2086.67 ml    Intake/Output this shift: Total I/O In: 1071.7 [I.V.:1071.7] Out: -   Labs: No results for input(s): "HGB" in the last 72 hours. No results for input(s): "WBC", "RBC", "HCT", "PLT" in the last 72 hours. No results for input(s): "NA", "K", "CL", "CO2", "BUN", "CREATININE", "GLUCOSE", "CALCIUM" in the last 72 hours. No results for input(s): "LABPT", "INR" in the last 72 hours.   EXAM General - Patient is Alert and Oriented Extremity - Neurovascular intact Sensation intact distally Compartment soft Dressing/Incision - clean, dry, with the hemovac removed with no complications Motor Function - intact, moving foot and toes well on exam.   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   Weight as of this encounter: 87.5 kg. Advance diet Up with therapy D/C IV fluids Discharge home with home health vs Rehab when safe with transfers  DVT Prophylaxis - Foot Pumps, TED hose, and Xeralto Weight-Bearing as tolerated to left leg  Dedra Skeens, PA-C Orthopaedic Surgery 02/16/2023, 6:15 AM

## 2023-02-16 NOTE — Plan of Care (Signed)

## 2023-02-17 DIAGNOSIS — M1612 Unilateral primary osteoarthritis, left hip: Secondary | ICD-10-CM | POA: Diagnosis not present

## 2023-02-17 NOTE — Progress Notes (Addendum)
Physical Therapy Treatment Patient Details Name: Dennis Savage MRN: [id number] DOB: 09/12/1929 Today's Date: 02/17/2023   History of Present Illness Pt is a 89 y.o. male s/p L THA.  Pt has a past medical history of arthritis, CHF, COPD, gastric reflux, hypertension, dizziness, LBP, and PVD.    PT Comments  Pt in bed.  Ready to get up. He struggles to EOB without rails and need mod a x 1 to get upright despite increased time to struggle and figure it out on his own.  He is steady in sitting and is able to stand from bed with min a x 1.  Completes x 1 lap on unit with RW and CGA x 1/min a due to some c/o feeling dizzy towards end of gait but BP remained WFL documented in flow sheet by tech.  He sits in recliner for medications and when requested to stand again he is unable to do so from recliner on his own.  When questioned why he stated he was too weak.  "I don't know how the hell I am going to do this at home."  Reached out to discuss with team.    Discussed with team and daughter.  Daughter and pt both would like rehab.  Given continued difficulty with mobility, fall risk and pt being home alone, will update recommendations to rehab.   If plan is discharge home, recommend the following: A little help with walking and/or transfers;A little help with bathing/dressing/bathroom;Assist for transportation;Help with stairs or ramp for entrance   Can travel by private vehicle        Equipment Recommendations  None recommended by PT (pt endorses having all equipment needs met)    Recommendations for Other Services       Precautions / Restrictions Precautions Precautions: Posterior Hip Precaution Booklet Issued: Yes (comment) Restrictions Weight Bearing Restrictions: Yes LLE Weight Bearing: Weight bearing as tolerated     Mobility  Bed Mobility Overal bed mobility: Needs Assistance Bed Mobility: Supine to Sit     Supine to sit: Min assist, Mod assist       Patient Response:  Cooperative  Transfers Overall transfer level: Needs assistance Equipment used: Rolling walker (2 wheels) Transfers: Sit to/from Stand Sit to Stand: Min assist           General transfer comment: stands better from bed but is unable to stand on his own from recliner today    Ambulation/Gait Ambulation/Gait assistance: Min assist Gait Distance (Feet): 200 Feet Assistive device: Rolling walker (2 wheels) Gait Pattern/deviations: Step-through pattern, Antalgic, Trunk flexed Gait velocity: decreased     General Gait Details: struggles more today with gait and some dizziness   Stairs             Wheelchair Mobility     Tilt Bed Tilt Bed Patient Response: Cooperative  Modified Rankin (Stroke Patients Only)       Balance Overall balance assessment: Needs assistance Sitting-balance support: Feet supported Sitting balance-Leahy Scale: Good     Standing balance support: Bilateral upper extremity supported, During functional activity, Reliant on assistive device for balance Standing balance-Leahy Scale: Fair Standing balance comment: semed to struggle a bit more today with overall quality and safe mobility                            Cognition Arousal: Alert Behavior During Therapy: WFL for tasks assessed/performed Overall Cognitive Status: Within Functional Limits for tasks assessed  Exercises      General Comments        Pertinent Vitals/Pain Pain Assessment Pain Assessment: Faces Faces Pain Scale: Hurts little more Pain Location: left hip Pain Descriptors / Indicators: Aching, Sore Pain Intervention(s): Limited activity within patient's tolerance, Monitored during session, Repositioned    Home Living                          Prior Function            PT Goals (current goals can now be found in the care plan section) Progress towards PT goals: Progressing toward  goals    Frequency    BID      PT Plan      Co-evaluation              AM-PAC PT "6 Clicks" Mobility   Outcome Measure  Help needed turning from your back to your side while in a flat bed without using bedrails?: A Little Help needed moving from lying on your back to sitting on the side of a flat bed without using bedrails?: A Lot Help needed moving to and from a bed to a chair (including a wheelchair)?: A Little Help needed standing up from a chair using your arms (e.g., wheelchair or bedside chair)?: A Little Help needed to walk in hospital room?: A Little Help needed climbing 3-5 steps with a railing? : A Little 6 Click Score: 17    End of Session   Activity Tolerance: Patient tolerated treatment well Patient left: in chair;with call bell/phone within reach;with chair alarm set;with family/visitor present Nurse Communication: Mobility status PT Visit Diagnosis: Unsteadiness on feet (R26.81);Other abnormalities of gait and mobility (R26.89);Muscle weakness (generalized) (M62.81);Difficulty in walking, not elsewhere classified (R26.2);Pain Pain - Right/Left: Left Pain - part of body: Hip     Time: 1610-9604 PT Time Calculation (min) (ACUTE ONLY): 20 min  Charges:    $Gait Training: 8-22 mins PT General Charges $$ ACUTE PT VISIT: 1 Visit                   Danielle Dess, PTA 02/17/23, 8:46 AM

## 2023-02-17 NOTE — Plan of Care (Signed)
  Problem: Fluid Volume: Goal: Hemodynamic stability will improve Outcome: Progressing   Problem: Clinical Measurements: Goal: Diagnostic test results will improve Outcome: Progressing   

## 2023-02-17 NOTE — Progress Notes (Signed)
  Subjective: 2 Days Post-Op Procedure(s) (LRB): TOTAL HIP ARTHROPLASTY (Left) Patient reports pain as mild.   Patient is well, and has had no acute complaints or problems Plan is to go Home vs Rehab after hospital stay. Negative for chest pain and shortness of breath Fever: no Gastrointestinal:Negative for nausea and vomiting  Objective: Vital signs in last 24 hours: Temp:  [98.2 F (36.8 C)-99.4 F (37.4 C)] 98.2 F (36.8 C) (11/02 2225) Pulse Rate:  [53-59] 59 (11/02 2225) Resp:  [18] 18 (11/02 2225) BP: (117-131)/(50-56) 131/50 (11/02 2225) SpO2:  [94 %-97 %] 95 % (11/02 2225)  Intake/Output from previous day:  Intake/Output Summary (Last 24 hours) at 02/17/2023 0649 Last data filed at 02/17/2023 0612 Gross per 24 hour  Intake 120 ml  Output 1150 ml  Net -1030 ml    Intake/Output this shift: Total I/O In: -  Out: 1150 [Urine:1150]  Labs: No results for input(s): "HGB" in the last 72 hours. No results for input(s): "WBC", "RBC", "HCT", "PLT" in the last 72 hours. No results for input(s): "NA", "K", "CL", "CO2", "BUN", "CREATININE", "GLUCOSE", "CALCIUM" in the last 72 hours. No results for input(s): "LABPT", "INR" in the last 72 hours.   EXAM General - Patient is Alert and Oriented Extremity - Neurovascular intact Sensation intact distally Compartment soft Dressing/Incision - clean, dry, with the hemovac removed with no complications Motor Function - intact, moving foot and toes well on exam.  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   Weight as of this encounter: 87.5 kg. Advance diet Up with therapy D/C IV fluids Discharge home with home health this morning with family  DVT Prophylaxis - Foot Pumps, TED hose, and Xeralto Weight-Bearing as tolerated to left leg  Dedra Skeens, PA-C Orthopaedic Surgery 02/17/2023, 6:49 AM

## 2023-02-17 NOTE — Progress Notes (Signed)
ORTHOPAEDICS: I reviewed the PT notes. Patient has made good progress with ambulation but is still not safe with transfers (bed to chair or chair/bed to standing). He is not safe to return home at this time due to his relative immobility and risk of falling.  He is deconditioned and would benefit from continued rehab in a skilled nursing environment.  He patient went to [facility] after his last total joint and expressed an interest in return there. His daughter was not present today, but is apparently reviewing the SND options. I have asked TOC to complete the FL@ so that we may expedite placement.  Antonique Langford P. Angie Fava M.D.

## 2023-02-17 NOTE — [place] FL2 (Signed)
[place] MEDICAID FL2 LEVEL OF CARE FORM     IDENTIFICATION  Patient Name: Dennis Savage Birthdate: 1930-01-06 Sex: male Admission Date (Current Location): 02/15/2023  [facility] and [place] Number:  Chiropodist and Address:  [facility], [address] [place], [place] [address]      Provider Number: [id number]  Attending Physician Name and Address:  Donato Heinz, MD  Relative Name and Phone Number:  Sedalia Muta [phone]    Current Level of Care: SNF Recommended Level of Care: Skilled Nursing Facility Prior Approval Number:    Date Approved/Denied:   PASRR Number: [id number]  Discharge Plan: SNF    Current Diagnoses: Patient Active Problem List   Diagnosis Date Noted   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   Exposure to potentially hazardous substance 01/13/2023   Sepsis due to pneumonia (HCC) 10/28/2022   Essential hypertension 10/28/2022   Paroxysmal atrial fibrillation (HCC) 10/28/2022   GERD without esophagitis 10/28/2022   Chronic diastolic CHF (congestive heart failure) (HCC) 10/28/2022   Chronic obstructive pulmonary disease (COPD) (HCC) 10/28/2022   Acute respiratory failure with hypoxia (HCC) 10/28/2022   Weakness 10/28/2022   Obesity (BMI 30-39.9) 06/21/2021   Pleural effusion on left 06/19/2021   CAP (community acquired pneumonia) 06/18/2021   Acute on chronic diastolic CHF (congestive heart failure) (HCC) 06/18/2021   Adrenal mass, left (HCC) 06/18/2021   Elevated troponin 06/18/2021   CHF (congestive heart failure) (HCC) 06/17/2021   Acute kidney injury superimposed on CKD (HCC) 06/17/2021   Acute CHF (congestive heart failure) (HCC) 06/17/2021   S/P total hip arthroplasty 10/12/2015   Degenerative arthritis of hip 09/12/2015   Atrial fibrillation (HCC) 07/13/2015   Apnea, sleep 07/13/2015   Acute diarrhea 02/25/2015   Dizziness 02/25/2015   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, 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/17/2015   Retinal hemorrhage 03/16/2014   Interstitial lung disease (HCC) 10/13/2013   After cataract not obscuring vision 12/21/2011   History of surgical procedure 12/21/2011   Constipation 06/15/2011   Encounter for general adult medical examination without abnormal findings 06/15/2011   Arthralgia of multiple joints 06/15/2011   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   Artificial lens present 12/08/2010   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   Benign essential HTN 11/03/2010   COPD with exacerbation (HCC) 11/03/2010   Benign hypertension 11/03/2010   Low back pain 11/03/2010   Peripheral vascular disease (HCC) 11/03/2010   Current tobacco use 11/03/2010    Orientation RESPIRATION BLADDER Height & Weight     Self, Time, Place  Normal Continent Weight: 87.5 kg Height:  5' 9.5" (176.5 cm)  BEHAVIORAL SYMPTOMS/MOOD NEUROLOGICAL BOWEL NUTRITION STATUS      Continent Diet  AMBULATORY STATUS COMMUNICATION OF NEEDS Skin   Limited Assist Verbally Normal                       Personal Care Assistance Level of Assistance  Dressing, Bathing Bathing Assistance: Limited assistance   Dressing Assistance: Limited assistance     Functional Limitations Info             SPECIAL CARE FACTORS FREQUENCY  PT (By licensed PT), OT (By licensed OT)     PT Frequency: 5 X weekly OT Frequency: 5 X weekly            Contractures Contractures Info: Not present    Additional Factors Info  Current Medications (02/17/2023):  This is the current hospital active medication list Current Facility-Administered Medications  Medication Dose Route Frequency Provider Last Rate Last Admin   0.9 %  sodium chloride infusion   Intravenous Continuous Hooten, Illene Labrador, MD   Stopped at 02/16/23 0021   acetaminophen (TYLENOL) tablet 325-650 mg  325-650 mg Oral Q6H PRN Hooten, Illene Labrador, MD       albuterol (PROVENTIL) (2.5 MG/3ML) 0.083% nebulizer solution 3 mL  3 mL Inhalation Q6H  PRN Hooten, Illene Labrador, MD       alum & mag hydroxide-simeth (MAALOX/MYLANTA) 200-200-20 MG/5ML suspension 30 mL  30 mL Oral Q4H PRN Hooten, Illene Labrador, MD       amLODipine (NORVASC) tablet 10 mg  10 mg Oral Daily Hooten, Illene Labrador, MD   10 mg at 02/17/23 9629   bisacodyl (DULCOLAX) suppository 10 mg  10 mg Rectal Daily PRN Hooten, Illene Labrador, MD       celecoxib (CELEBREX) capsule 200 mg  200 mg Oral BID Donato Heinz, MD   200 mg at 02/17/23 5284   chlorthalidone (HYGROTON) tablet 25 mg  25 mg Oral Once per day on Sunday Wednesday Donato Heinz, MD   25 mg at 02/17/23 1324   diphenhydrAMINE (BENADRYL) 12.5 MG/5ML elixir 12.5-25 mg  12.5-25 mg Oral Q4H PRN Hooten, Illene Labrador, MD       doxycycline (VIBRA-TABS) tablet 100 mg  100 mg Oral BID Donato Heinz, MD   100 mg at 02/17/23 4010   ferrous sulfate tablet 325 mg  325 mg Oral BID WC Hooten, Illene Labrador, MD   325 mg at 02/17/23 0821   fluticasone furoate-vilanterol (BREO ELLIPTA) 100-25 MCG/ACT 1 puff  1 puff Inhalation Daily Hooten, Illene Labrador, MD   1 puff at 02/17/23 2725   And   umeclidinium bromide (INCRUSE ELLIPTA) 62.5 MCG/ACT 1 puff  1 puff Inhalation Daily Hooten, Illene Labrador, MD   1 puff at 02/17/23 3664   HYDROmorphone (DILAUDID) injection 0.5-1 mg  0.5-1 mg Intravenous Q4H PRN Hooten, Illene Labrador, MD       magnesium hydroxide (MILK OF MAGNESIA) suspension 30 mL  30 mL Oral Daily Hooten, Illene Labrador, MD   30 mL at 02/17/23 4034   menthol-cetylpyridinium (CEPACOL) lozenge 3 mg  1 lozenge Oral PRN Hooten, Illene Labrador, MD       Or   phenol (CHLORASEPTIC) mouth spray 1 spray  1 spray Mouth/Throat PRN Hooten, Illene Labrador, MD       ondansetron (ZOFRAN) tablet 4 mg  4 mg Oral Q6H PRN Hooten, Illene Labrador, MD       Or   ondansetron (ZOFRAN) injection 4 mg  4 mg Intravenous Q6H PRN Hooten, Illene Labrador, MD       oxyCODONE (Oxy IR/ROXICODONE) immediate release tablet 10 mg  10 mg Oral Q4H PRN Hooten, Illene Labrador, MD       oxyCODONE (Oxy IR/ROXICODONE) immediate release tablet 5 mg  5 mg Oral  Q4H PRN Hooten, Illene Labrador, MD   5 mg at 02/16/23 2125   pantoprazole (PROTONIX) EC tablet 40 mg  40 mg Oral BID Donato Heinz, MD   40 mg at 02/17/23 7425   relugolix (ORGOVYX) tablet 120 mg  120 mg Oral QPM Hooten, Illene Labrador, MD       rivaroxaban (XARELTO) tablet 20 mg  20 mg Oral Q supper Hooten, Illene Labrador, MD   20 mg at 02/16/23 1756   senna-docusate (Senokot-S)  tablet 1 tablet  1 tablet Oral BID Hooten, Illene Labrador, MD   1 tablet at 02/17/23 7829   sodium phosphate (FLEET) enema 1 enema  1 enema Rectal Once PRN Hooten, Illene Labrador, MD       traMADol Janean Sark) tablet 50-100 mg  50-100 mg Oral Q4H PRN Hooten, Illene Labrador, MD         Discharge Medications: Please see discharge summary for a list of discharge medications.  Relevant Imaging Results:  Relevant Lab Results:   Additional Information SS# [id number]  Luvenia Redden, RN

## 2023-02-17 NOTE — Plan of Care (Signed)
°  Problem: Fluid Volume: °Goal: Hemodynamic stability will improve °Outcome: Progressing °  °Problem: Clinical Measurements: °Goal: Diagnostic test results will improve °Outcome: Progressing °Goal: Signs and symptoms of infection will decrease °Outcome: Progressing °  °

## 2023-02-17 NOTE — TOC Progression Note (Addendum)
Transition of Care [facility]) - Progression Note    Patient Details  Name: Dennis Savage MRN: [id number] Date of Birth: 1930/01/14  Transition of [facility]) CM/SW Contact  Ashley Royalty Lutricia Feil, RN Phone Number: 02/17/2023, 10:54 AM  Clinical Narrative:    Pt has recommendations for SNF level of care for STR. RN TOC has spoken with daughter Sedalia Muta concerning choice. Due ot her work schedule she will review the facilities on [email or web address] prior to making a choice decision the facilities of choice.  Will alert TOC actively involved with this pt tomorrow to follow up accordingly for SNF placement.  1;18 PM requested to go ahead and submit FL2 for [facility] expressed by pt to this Attending. Will completed FL2 as requested. Expected Discharge Plan: Home w Home Health Services Barriers to Discharge: No Barriers Identified  Expected Discharge Plan and Services   Discharge Planning Services: CM Consult   Living arrangements for the past 2 months: Single Family Home Expected Discharge Date: 02/17/23               DME Arranged: N/A DME Agency: NA       HH Arranged: PT, OT HH Agency: [facility] Date HH Agency Contacted: 02/15/23 Time HH Agency Contacted: 1547 Representative spoke with at [facility] Agency: [place]   Social Determinants of Health (SDOH) Interventions SDOH Screenings   Food Insecurity: No Food Insecurity (02/01/2023)   Received from [facility]  Housing: Low Risk  (10/28/2022)  Transportation Needs: No Transportation Needs (02/01/2023)   Received from [facility]  Utilities: Not At Risk (02/01/2023)   Received from [facility]  Depression [phone]): Low Risk  (06/30/2021)  Financial Resource Strain: Low Risk  (02/01/2023)   Received from [facility]  Tobacco Use: Medium Risk (02/15/2023)    Readmission Risk Interventions     No data to display

## 2023-02-18 DIAGNOSIS — M1612 Unilateral primary osteoarthritis, left hip: Secondary | ICD-10-CM | POA: Diagnosis not present

## 2023-02-18 NOTE — Progress Notes (Signed)
Physical Therapy Treatment Patient Details Name: Dennis Savage MRN: [id number] DOB: [birth year] Today's Date: 02/18/2023   History of Present Illness Pt is a 89 y.o. male s/p L THA.  Pt has a past medical history of arthritis, CHF, COPD, gastric reflux, hypertension, dizziness, LBP, and PVD.    PT Comments  Daughter in room stating insurance has denied SNF and they will go going home.  He stated he slept better and generally improved today.  He struggles OOB on left and right side but is able to do on his own with increased time.  Continues to need min a x 1 on right and left to get legs onto bed and repositioned.  His gait is improved today and is generally steady with RW and cga x 1.  Discussed discharge with pt and daughter and equipment recommendations are given.  Pt would still benefit from +1 assist with mobility upon discharge.  Pt did benefit from an additional night in the hospital but they have chosen to return home at this point due to insurance declining SNF.   If plan is discharge home, recommend the following: A little help with walking and/or transfers;A little help with bathing/dressing/bathroom;Assist for transportation;Help with stairs or ramp for entrance   Can travel by private vehicle        Equipment Recommendations   (pt and family declined BSC despite encouragement)    Recommendations for Other Services       Precautions / Restrictions Precautions Precautions: Posterior Hip Precaution Booklet Issued: Yes (comment) Restrictions Weight Bearing Restrictions: Yes LLE Weight Bearing: Weight bearing as tolerated     Mobility  Bed Mobility Overal bed mobility: Needs Assistance Bed Mobility: Supine to Sit     Supine to sit: Contact guard, Min assist Sit to supine: Min assist   General bed mobility comments: improved today and he is able to struggle to get upright but does need min a to get feet comfortably in bed. Patient Response:  Cooperative  Transfers Overall transfer level: Needs assistance Equipment used: Rolling walker (2 wheels)   Sit to Stand: Contact guard assist, Min assist           General transfer comment: occasional cues for hand placements    Ambulation/Gait Ambulation/Gait assistance: Contact guard assist Gait Distance (Feet): 120 Feet Assistive device: Rolling walker (2 wheels)   Gait velocity: decreased     General Gait Details: gait improved today.  limited gait to focus on bed mobility skills for discharge.   Stairs             Wheelchair Mobility     Tilt Bed Tilt Bed Patient Response: Cooperative  Modified Rankin (Stroke Patients Only)       Balance Overall balance assessment: Needs assistance Sitting-balance support: Feet supported Sitting balance-Leahy Scale: Good     Standing balance support: Bilateral upper extremity supported, During functional activity, Reliant on assistive device for balance                                Cognition Arousal: Alert Behavior During Therapy: WFL for tasks assessed/performed Overall Cognitive Status: Within Functional Limits for tasks assessed                                          Exercises      General  Comments        Pertinent Vitals/Pain Pain Assessment Pain Assessment: Faces Faces Pain Scale: Hurts a little bit Pain Location: left hip Pain Descriptors / Indicators: Aching, Sore Pain Intervention(s): Limited activity within patient's tolerance, Monitored during session, Premedicated before session, Repositioned    Home Living                          Prior Function            PT Goals (current goals can now be found in the care plan section) Progress towards PT goals: Progressing toward goals    Frequency    BID      PT Plan      Co-evaluation              AM-PAC PT "6 Clicks" Mobility   Outcome Measure  Help needed turning from your  back to your side while in a flat bed without using bedrails?: A Little Help needed moving from lying on your back to sitting on the side of a flat bed without using bedrails?: A Little Help needed moving to and from a bed to a chair (including a wheelchair)?: A Little Help needed standing up from a chair using your arms (e.g., wheelchair or bedside chair)?: A Little Help needed to walk in hospital room?: A Little Help needed climbing 3-5 steps with a railing? : A Little 6 Click Score: 18    End of Session Equipment Utilized During Treatment: Gait belt Activity Tolerance: Patient tolerated treatment well Patient left: in chair;with call bell/phone within reach;with chair alarm set;with family/visitor present Nurse Communication: Mobility status PT Visit Diagnosis: Unsteadiness on feet (R26.81);Other abnormalities of gait and mobility (R26.89);Muscle weakness (generalized) (M62.81);Difficulty in walking, not elsewhere classified (R26.2);Pain Pain - Right/Left: Left Pain - part of body: Hip     Time: 1610-9604 PT Time Calculation (min) (ACUTE ONLY): 21 min  Charges:    $Gait Training: 8-22 mins PT General Charges $$ ACUTE PT VISIT: 1 Visit                   Danielle Dess, PTA 02/18/23, 11:11 AM

## 2023-02-18 NOTE — Progress Notes (Signed)
Occupational Therapy Treatment Patient Details Name: Dennis Savage MRN: [id number] DOB: December 10, 1929 Today's Date: 02/18/2023   History of present illness Pt is a 89 y.o. male s/p L THA.  Pt has a past medical history of arthritis, CHF, COPD, gastric reflux, hypertension, dizziness, LBP, and PVD.   OT comments  Pt is seated in recliner on arrival. Pleasant and agreeable to OT session. Minimal pain reported in L hip, does not bear full weight through LLE at this time to avoid higher pain levels. Pt performed STS from EOB and recliner with CGA/SBA and good safety awareness with no cueing for hand placement. He ambulated to the bathroom using RW with CGA and demo toilet transfer to [facility] over toilet with SBA. Pt demo ability to reach minimally outside BOS to get hand sanitizer with CGA and use of RW. Pt stating 3/3 posterior hip precautions. Educated on AE/AD/DME for improved safety and IND with ADL performance while following hip preacutions upon return home. Pt uses sock aide and reacher daily for LB dressing tasks with no further education needed. Educated on use of leg lifter as needed for car/bed transfers. Pt appreciative of all education. He demo simluated step into/out of his bathroom at home which daughter states is a slight decline coming out and pt able to step over object with CGA using RW. Pt returned to recliner with all needs in place and will cont to require skilled acute OT services to maximize his safety and IND to return to PLOF.       If plan is discharge home, recommend the following:  A little help with walking and/or transfers;A little help with bathing/dressing/bathroom;Assist for transportation;Help with stairs or ramp for entrance;Assistance with cooking/housework   Equipment Recommendations  None recommended by OT    Recommendations for Other Services      Precautions / Restrictions Precautions Precautions: Posterior Hip Precaution Booklet Issued: Yes  (comment) Restrictions Weight Bearing Restrictions: Yes LLE Weight Bearing: Weight bearing as tolerated       Mobility Bed Mobility                    Transfers Overall transfer level: Needs assistance Equipment used: Rolling walker (2 wheels) Transfers: Sit to/from Stand Sit to Stand: Contact guard assist           General transfer comment: CGA for STS from recliner and EOB with good safety and hand placement     Balance Overall balance assessment: Needs assistance Sitting-balance support: Feet supported Sitting balance-Leahy Scale: Good     Standing balance support: Bilateral upper extremity supported, During functional activity, Reliant on assistive device for balance Standing balance-Leahy Scale: Fair Standing balance comment: able to stand at RW and get hand sanitizer via reaching forward and laterally with no LOB and CGA                           ADL either performed or assessed with clinical judgement   ADL Overall ADL's : Needs assistance/impaired                         Toilet Transfer: Supervision/safety;Ambulation;Grab bars;Rolling walker (2 wheels) Toilet Transfer Details (indicate cue type and reason): BSC over top of toilet         Functional mobility during ADLs: Supervision/safety General ADL Comments: CGA for mobility from recliner<>bathroom    Extremity/Trunk Assessment Upper Extremity Assessment Upper Extremity Assessment: Overall [facility]  for tasks assessed   Lower Extremity Assessment Lower Extremity Assessment: Generalized weakness LLE Deficits / Details: s/p THA        Vision       Perception     Praxis      Cognition Arousal: Alert Behavior During Therapy: WFL for tasks assessed/performed Overall Cognitive Status: Within Functional Limits for tasks assessed                                          Exercises Other Exercises Other Exercises: Educated on various AE/AD/DME. Pt  reports he uses a reacher and sock aide at home daily to perform LB dressing. Educated on long handled sponge for bathing and leg lifter if needed for transfers in/out of bed and car. Pt declined need at this time. Has bars by toilet at home for ease with transfers.    Shoulder Instructions       General Comments      Pertinent Vitals/ Pain       Pain Assessment Pain Assessment: Faces Faces Pain Scale: Hurts a little bit Pain Location: left hip Pain Descriptors / Indicators: Aching, Sore Pain Intervention(s): Monitored during session, Limited activity within patient's tolerance  Home Living                                          Prior Functioning/Environment              Frequency  Min 1X/week        Progress Toward Goals  OT Goals(current goals can now be found in the care plan section)  Progress towards OT goals: Progressing toward goals  Acute Rehab OT Goals Patient Stated Goal: return home OT Goal Formulation: With patient Time For Goal Achievement: 03/02/23 Potential to Achieve Goals: Good  Plan      Co-evaluation                 AM-PAC OT "6 Clicks" Daily Activity     Outcome Measure   Help from another person eating meals?: None Help from another person taking care of personal grooming?: None Help from another person toileting, which includes using toliet, bedpan, or urinal?: A Little Help from another person bathing (including washing, rinsing, drying)?: A Little Help from another person to put on and taking off regular upper body clothing?: A Little Help from another person to put on and taking off regular lower body clothing?: A Little 6 Click Score: 20    End of Session Equipment Utilized During Treatment: Rolling walker (2 wheels)  OT Visit Diagnosis: Unsteadiness on feet (R26.81)   Activity Tolerance Patient tolerated treatment well   Patient Left in chair;with family/visitor present;with call bell/phone within  reach   Nurse Communication          Time: 1610-9604 OT Time Calculation (min): 33 min  Charges: OT General Charges $OT Visit: 1 Visit OT Treatments $Self Care/Home Management : 23-37 mins  Dennis Savage, OTR/L  02/18/23, 1:26 PM  Dennis Savage Dennis Savage 02/18/2023, 1:22 PM

## 2023-02-18 NOTE — Progress Notes (Signed)
ORTHOPAEDICS: Patient sleeping soundly this AM. Patient informed me yesterday that he was passing gas but no BM. He has been receiving Senokot S and MOM daily since surgery. No documentation of BM by nursing and it does not appear that Dulcolax has been given. I will ask nursing to use Dulcolax today. Patient must have a BM before he can be transferred to SND.  TOC working on SND placement. FL2 completed and signed yesterday.  Continue PT and OT pending placement.  Charlena Haub P. Angie Fava M.D.

## 2023-02-18 NOTE — Progress Notes (Signed)
Subjective: 3 Days Post-Op Procedure(s) (LRB): TOTAL HIP ARTHROPLASTY (Left) Patient reports pain as mild.   Patient seen in rounds with Dr. Ernest Pine. Patient is well, and has had no acute complaints or problems. Denies any CP, SOB, N/V, fevers or chills.  States that he is passing flatus, but has still not had a BM We will continue with therapy today.  Plan is to go Rehab after hospital stay.  Objective: Vital signs in last 24 hours: Temp:  [97.9 F (36.6 C)-98.9 F (37.2 C)] 97.9 F (36.6 C) (11/04 0736) Pulse Rate:  [52-68] 59 (11/04 0815) Resp:  [16-20] 18 (11/04 0736) BP: (118-139)/(52-74) 139/64 (11/04 0736) SpO2:  [94 %-96 %] 96 % (11/04 0736)  Intake/Output from previous day:  Intake/Output Summary (Last 24 hours) at 02/18/2023 1204 Last data filed at 02/18/2023 1049 Gross per 24 hour  Intake 240 ml  Output --  Net 240 ml    Intake/Output this shift: Total I/O In: 240 [P.O.:240] Out: -   Labs: No results for input(s): "HGB" in the last 72 hours. No results for input(s): "WBC", "RBC", "HCT", "PLT" in the last 72 hours. No results for input(s): "NA", "K", "CL", "CO2", "BUN", "CREATININE", "GLUCOSE", "CALCIUM" in the last 72 hours. No results for input(s): "LABPT", "INR" in the last 72 hours.  EXAM General - Patient is Alert, Appropriate, and Oriented Extremity - Neurologically intact ABD soft Neurovascular intact Sensation intact distally Intact pulses distally Dorsiflexion/Plantar flexion intact No cellulitis present Compartment soft Dressing - dressing C/D/I and scant drainage Motor Function - intact, moving foot and toes well on exam. Able to plantar and dorsi flex with good strength and ROM.  Is neurovascularly intact all dermatomes down to left lower extremity.  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   Weight as of this encounter: 87.5 kg. Advance diet Up with therapy  Patient will continue to work with physical therapy to pass postoperative PT protocols, ROM and strengthening  Hip Preacutions  Discussed with the patient continuing to utilize ice over the bandage  Patient will wear TED hose bilaterally to help prevent DVT and clot formation  Discussed the Aquacel bandage.  This bandage will stay in place 7 days postoperatively.  Can be replaced with honeycomb bandages that will be sent home with the patient  Discussed sending the patient home with tramadol and oxycodone for as needed pain management.  Patient will also be sent home  with Celebrex to help with swelling and inflammation.  Patient will continue on at home Xarelto daily for DVT prophylaxis  Weight-Bearing as tolerated to left leg  Discussed with TOC, states they talked with the patient and daughter about SNF versus going home.  TOC states that they are looking at potentially discharging home with  HHPT  Patient will follow-up with [facility] clinic orthopedics in 6 weeks for re-imaging and reevaluation   Rayburn Go, PA-C [facility] 02/18/2023, 12:04 PM

## 2023-02-18 NOTE — TOC Progression Note (Signed)
Transition of Care [facility]) - Progression Note    Patient Details  Name: Dennis Savage MRN: [id number] Date of Birth: 07-18-1929  Transition of Care [facility]) CM/SW Contact  Marlowe Sax, RN Phone Number: 02/18/2023, 9:41 AM  Clinical Narrative:    Met with the patient and his daughter in there oom, I explained the MOON and that he can not go to STR unless pay out of pocket due to being Observation status Provided with the Written MOON and explained, he has DME at home and will have PT at home as well, his daughter will provide transportation home today  Expected Discharge Plan: Home w Home Health Services Barriers to Discharge: No Barriers Identified  Expected Discharge Plan and Services   Discharge Planning Services: CM Consult   Living arrangements for the past 2 months: Single Family Home Expected Discharge Date: 02/17/23               DME Arranged: N/A DME Agency: NA       HH Arranged: PT, OT HH Agency: [facility] Date HH Agency Contacted: 02/15/23 Time HH Agency Contacted: 1547 Representative spoke with at [facility] Agency: [place]   Social Determinants of Health (SDOH) Interventions SDOH Screenings   Food Insecurity: No Food Insecurity (02/01/2023)   Received from [facility]  Housing: Low Risk  (10/28/2022)  Transportation Needs: No Transportation Needs (02/01/2023)   Received from [facility]  Utilities: Not At Risk (02/01/2023)   Received from [facility]  Depression [phone]): Low Risk  (06/30/2021)  Financial Resource Strain: Low Risk  (02/01/2023)   Received from [facility]  Tobacco Use: Medium Risk (02/15/2023)    Readmission Risk Interventions     No data to display

## 2023-02-18 NOTE — Plan of Care (Signed)

## 2023-02-18 NOTE — Care Management Obs Status (Signed)
MEDICARE OBSERVATION STATUS NOTIFICATION   Patient Details  Name: Dennis Savage MRN: [id number] Date of Birth: 1929-12-19   Medicare Observation Status Notification Given:  Yes    Marlowe Sax, RN 02/18/2023, 9:40 AM

## 2023-02-19 DIAGNOSIS — M1612 Unilateral primary osteoarthritis, left hip: Secondary | ICD-10-CM | POA: Diagnosis not present

## 2023-02-19 NOTE — Progress Notes (Signed)
ORTHOPAEDICS: Notes from OT and PT were reviewed.  The patient has made gradual improvement, but there are still substantial concerns about the patient's safety with being alone.  Recommendation is for 1+ assist with transfers.  Assistive devices have also been ordered and will apparently be delivered tomorrow.  The patient's daughter is apparently not working tomorrow and I recommended working with OT and PT in the morning with plans to discharge to home tomorrow.  We will initiate home physical therapy.  Zoila Ditullio P. Angie Fava M.D.

## 2023-02-19 NOTE — Progress Notes (Signed)
Physical Therapy Treatment Patient Details Name: Dennis Savage MRN: [id number] DOB: Jan 10, 1930 Today's Date: 02/19/2023   History of Present Illness Pt is a 89 y.o. male s/p L THA.  Pt has a past medical history of arthritis, CHF, COPD, gastric reflux, hypertension, dizziness, LBP, and PVD.    PT Comments  Pt continues to struggle to get OOB on L with HOB raised about 20 and 40 degrees.  He does have an electric bed at home but does not typically use features.  He needs min a x 1 on each side and heavy cues.  He is able to stand with CGA x 1 and cues for hand placements as he does try to pull up on walker when left to try on his own.  He is able to walk to/from doors in room and open and close bathroom and room door without assist.  He is able to step backwards a few steps and turn without LOB.  He does walk a short distance in hallway but is limited by L hip pain which is increased by practicing bed mobility skills.  Remained in chair after session. Will plan to return later this AM or right after lunch for BID session for continued focus on mobility.  Will ask for pain medications as needed.    Discussed with MD and TOC.  Pt continues to need +1 assist at times for safety and assist with bed mobility and transfers.  Overall gait is improving but concerns remain for his ability to get in/out of bed on his own and have safe transitions.  There is some conflicting information on supervision available at home.  Pt would benefit from continued +1 assist for mobility.  Will continue heavy focus in therapy sessions on bed mobility and transfers.   If plan is discharge home, recommend the following: A little help with walking and/or transfers;A little help with bathing/dressing/bathroom;Assist for transportation;Help with stairs or ramp for entrance   Can travel by private vehicle        Equipment Recommendations   (pt and family declined BSC despite encouragement)    Recommendations for Other  Services       Precautions / Restrictions Precautions Precautions: Posterior Hip Precaution Booklet Issued: Yes (comment) Restrictions Weight Bearing Restrictions: Yes LLE Weight Bearing: Weight bearing as tolerated     Mobility  Bed Mobility Overal bed mobility: Needs Assistance Bed Mobility: Supine to Sit     Supine to sit: Min assist Sit to supine: Contact guard assist   General bed mobility comments: continues to struggle to sitting on L with HOB raised and cues.  needs min assist.  he was able to get his legs up on his bed today without assist with 2 attempts Patient Response: Cooperative  Transfers Overall transfer level: Needs assistance Equipment used: Rolling walker (2 wheels)   Sit to Stand: Contact guard assist, Min assist           General transfer comment: cues for hand placements continue and relies heavily with legs on back of chair for support and assist.    Ambulation/Gait Ambulation/Gait assistance: Contact guard assist   Assistive device: Rolling walker (2 wheels)   Gait velocity: decreased     General Gait Details: able to open and close doors to bathroom and room with stepping back a few steps each time with good control   Stairs             Wheelchair Mobility     Tilt Bed  Tilt Bed Patient Response: Cooperative  Modified Rankin (Stroke Patients Only)       Balance Overall balance assessment: Needs assistance Sitting-balance support: Feet supported Sitting balance-Leahy Scale: Good     Standing balance support: Bilateral upper extremity supported, During functional activity, Reliant on assistive device for balance Standing balance-Leahy Scale: Fair Standing balance comment: able to open and close doors to room and bathroom                            Cognition Arousal: Alert Behavior During Therapy: [facility] for tasks assessed/performed Overall Cognitive Status: Within Functional Limits for tasks assessed                                           Exercises      General Comments        Pertinent Vitals/Pain Pain Assessment Pain Assessment: Faces Faces Pain Scale: Hurts little more Pain Location: left hip - increased with bed mobility practice and gait Pain Descriptors / Indicators: Aching, Sore Pain Intervention(s): Limited activity within patient's tolerance, Monitored during session, Repositioned    Home Living                          Prior Function            PT Goals (current goals can now be found in the care plan section) Progress towards PT goals: Progressing toward goals    Frequency    BID      PT Plan      Co-evaluation              AM-PAC PT "6 Clicks" Mobility   Outcome Measure  Help needed turning from your back to your side while in a flat bed without using bedrails?: A Little Help needed moving from lying on your back to sitting on the side of a flat bed without using bedrails?: A Little Help needed moving to and from a bed to a chair (including a wheelchair)?: A Little Help needed standing up from a chair using your arms (e.g., wheelchair or bedside chair)?: A Little Help needed to walk in hospital room?: A Little Help needed climbing 3-5 steps with a railing? : A Little 6 Click Score: 18    End of Session Equipment Utilized During Treatment: Gait belt Activity Tolerance: Patient tolerated treatment well Patient left: in chair;with call bell/phone within reach;with chair alarm set;with family/visitor present Nurse Communication: Mobility status PT Visit Diagnosis: Unsteadiness on feet (R26.81);Other abnormalities of gait and mobility (R26.89);Muscle weakness (generalized) (M62.81);Difficulty in walking, not elsewhere classified (R26.2);Pain Pain - Right/Left: Left Pain - part of body: Hip     Time: 0902-0921 PT Time Calculation (min) (ACUTE ONLY): 19 min  Charges:    $Gait Training: 8-22 mins PT General  Charges $$ ACUTE PT VISIT: 1 Visit                   Danielle Dess, PTA 02/19/23, 9:43 AM

## 2023-02-19 NOTE — Plan of Care (Signed)
  Problem: Fluid Volume: Goal: Hemodynamic stability will improve Outcome: Progressing   Problem: Clinical Measurements: Goal: Diagnostic test results will improve Outcome: Progressing Goal: Signs and symptoms of infection will decrease Outcome: Progressing   Problem: Respiratory: Goal: Ability to maintain adequate ventilation will improve Outcome: Progressing   Problem: Education: Goal: Knowledge of General Education information will improve Description: Including pain rating scale, medication(s)/side effects and non-pharmacologic comfort measures Outcome: Progressing   Problem: Health Behavior/Discharge Planning: Goal: Ability to manage health-related needs will improve Outcome: Progressing   Problem: Clinical Measurements: Goal: Ability to maintain clinical measurements within normal limits will improve Outcome: Progressing Goal: Will remain free from infection Outcome: Progressing Goal: Diagnostic test results will improve Outcome: Progressing Goal: Respiratory complications will improve Outcome: Progressing Goal: Cardiovascular complication will be avoided Outcome: Progressing   Problem: Activity: Goal: Risk for activity intolerance will decrease Outcome: Progressing   Problem: Nutrition: Goal: Adequate nutrition will be maintained Outcome: Progressing   Problem: Coping: Goal: Level of anxiety will decrease Outcome: Progressing   Problem: Elimination: Goal: Will not experience complications related to bowel motility Outcome: Progressing Goal: Will not experience complications related to urinary retention Outcome: Progressing   Problem: Pain Management: Goal: General experience of comfort will improve Outcome: Progressing   Problem: Safety: Goal: Ability to remain free from injury will improve Outcome: Progressing   Problem: Skin Integrity: Goal: Risk for impaired skin integrity will decrease Outcome: Progressing   Problem: Education: Goal:  Knowledge of the prescribed therapeutic regimen will improve Outcome: Progressing Goal: Understanding of discharge needs will improve Outcome: Progressing Goal: Individualized Educational Video(s) Outcome: Progressing   Problem: Activity: Goal: Ability to avoid complications of mobility impairment will improve Outcome: Progressing Goal: Ability to tolerate increased activity will improve Outcome: Progressing   Problem: Clinical Measurements: Goal: Postoperative complications will be avoided or minimized Outcome: Progressing   Problem: Pain Management: Goal: Pain level will decrease with appropriate interventions Outcome: Progressing   Problem: Skin Integrity: Goal: Will show signs of wound healing Outcome: Progressing

## 2023-02-19 NOTE — Progress Notes (Signed)
Occupational Therapy Treatment Patient Details Name: Dennis Savage MRN: [id number] DOB: [birth year] Today's Date: 02/19/2023   History of present illness Pt is a 89 y.o. male s/p L THA.  Pt has a past medical history of arthritis, CHF, COPD, gastric reflux, hypertension, dizziness, LBP, and PVD.   OT comments  Pt is supine in bed on arrival. Pleasant and agreeable to OT session. He reports 2/10 pain to his L hip. Pt performed bed mobility with SUP and use of bedrails to get OOB on R side today. Pt performed UB/LB bathing tasks in standing at sink. He required SBA for safety in standing with unilateral support and Mod A to perform LB bathing d/t post hip precautions and no long handled sponge accessible-will have one at home to use and a shower seat to sit on. Pt required CGA/SBA for STS from EOB via pulling on sink. Performed oral care standing at sink with SBA. Ambulated to the bathroom using RW with CGA/SBA and demo toilet transfer with SBA. Pain well managed for OT session with pt performing tasks with CGA to SBA x1 and would benefit from having x1 assist for safety at home. Edu on bathing only when family members are home sometime before lunch and he verbalized understanding. Pt returned to bed with all needs in place and will cont to require skilled acute OT services to maximize his safety and IND to return to PLOF.       If plan is discharge home, recommend the following:  A little help with walking and/or transfers;A little help with bathing/dressing/bathroom;Assist for transportation;Help with stairs or ramp for entrance;Assistance with cooking/housework   Equipment Recommendations  None recommended by OT    Recommendations for Other Services      Precautions / Restrictions Precautions Precautions: Posterior Hip Precaution Booklet Issued: Yes (comment) Restrictions Weight Bearing Restrictions: Yes LLE Weight Bearing: Weight bearing as tolerated       Mobility Bed  Mobility Overal bed mobility: Needs Assistance Bed Mobility: Supine to Sit, Sit to Supine     Supine to sit: HOB elevated, Supervision, Used rails Sit to supine: HOB elevated, Supervision, Used rails   General bed mobility comments: pt got OOB on R side for this session and required SUP using bed rails, no physical assist required    Transfers Overall transfer level: Needs assistance Equipment used: Rolling walker (2 wheels) Transfers: Sit to/from Stand             General transfer comment: Pt performed STS from EOB to sink via pulling on sink with SBA then able to stand and perform bathing tasks with SBA for safety     Balance Overall balance assessment: Needs assistance Sitting-balance support: Feet supported Sitting balance-Leahy Scale: Good     Standing balance support: During functional activity, Single extremity supported Standing balance-Leahy Scale: Fair Standing balance comment: no LOB in standing for LB bathing/UB bathing at sink with unilateral support on counter                           ADL either performed or assessed with clinical judgement   ADL Overall ADL's : Needs assistance/impaired     Grooming: Supervision/safety;Oral care;Wash/dry face;Applying deodorant;Brushing hair;Standing   Upper Body Bathing: Supervision/ safety;Standing   Lower Body Bathing: Sit to/from stand;Moderate assistance           Toilet Transfer: Supervision/safety;Ambulation;Grab bars;Rolling walker (2 wheels) Toilet Transfer Details (indicate cue type and reason): BSC over  top of toilet         Functional mobility during ADLs: Contact guard assist;Supervision/safety General ADL Comments: CGA for mobility from EOB<>bathroom    Extremity/Trunk Assessment Upper Extremity Assessment Upper Extremity Assessment: Overall WFL for tasks assessed   Lower Extremity Assessment Lower Extremity Assessment: Generalized weakness LLE Deficits / Details: s/p THA         Vision       Perception     Praxis      Cognition Arousal: Alert Behavior During Therapy: WFL for tasks assessed/performed Overall Cognitive Status: Within Functional Limits for tasks assessed                                          Exercises Other Exercises Other Exercises: Educated on use of long handled sponge for LB bathing tasks and sitting on shower chair in front of sink to perform bathing to maximize safety.    Shoulder Instructions       General Comments      Pertinent Vitals/ Pain       Pain Assessment Pain Assessment: 0-10 Pain Score: 2  Pain Location: L hip Pain Descriptors / Indicators: Aching, Sore Pain Intervention(s): Monitored during session  Home Living                                          Prior Functioning/Environment              Frequency  Min 1X/week        Progress Toward Goals  OT Goals(current goals can now be found in the care plan section)  Progress towards OT goals: Progressing toward goals  Acute Rehab OT Goals Patient Stated Goal: return home OT Goal Formulation: With patient Time For Goal Achievement: 03/02/23 Potential to Achieve Goals: Good  Plan      Co-evaluation                 AM-PAC OT "6 Clicks" Daily Activity     Outcome Measure   Help from another person eating meals?: None Help from another person taking care of personal grooming?: None Help from another person toileting, which includes using toliet, bedpan, or urinal?: A Little Help from another person bathing (including washing, rinsing, drying)?: A Little Help from another person to put on and taking off regular upper body clothing?: None Help from another person to put on and taking off regular lower body clothing?: A Little 6 Click Score: 21    End of Session Equipment Utilized During Treatment: Rolling walker (2 wheels)  OT Visit Diagnosis: Unsteadiness on feet (R26.81)   Activity Tolerance  Patient tolerated treatment well   Patient Left with call bell/phone within reach;in bed;with bed alarm set   Nurse Communication Mobility status        Time: 0347-4259 OT Time Calculation (min): 32 min  Charges: OT General Charges $OT Visit: 1 Visit OT Treatments $Self Care/Home Management : 23-37 mins  Samah Lapiana, OTR/L  02/19/23, 3:30 PM   [name] E Starla Deller 02/19/2023, 3:26 PM

## 2023-02-19 NOTE — Plan of Care (Signed)

## 2023-02-19 NOTE — Progress Notes (Signed)
Subjective: 4 Days Post-Op Procedure(s) (LRB): TOTAL HIP ARTHROPLASTY (Left) Patient reports pain as mild.   Patient seen in rounds with Dr. Ernest Pine. Patient is well, and has had no acute complaints or problems. Denies any CP, SOB, N/V, fevers or chills.  Has successfully had a BM yesterday.  We will continue with therapy today.  Plan is to go home after hospital stay.  Objective: Vital signs in last 24 hours: Temp:  [97.8 F (36.6 C)-98.4 F (36.9 C)] 98.4 F (36.9 C) (11/04 2337) Pulse Rate:  [43-70] 70 (11/04 2337) Resp:  [16-20] 20 (11/04 2337) BP: (125-127)/(60-62) 127/60 (11/04 2337) SpO2:  [93 %-97 %] 93 % (11/04 2337)  Intake/Output from previous day:  Intake/Output Summary (Last 24 hours) at 02/19/2023 0809 Last data filed at 02/18/2023 1900 Gross per 24 hour  Intake 360 ml  Output --  Net 360 ml    Intake/Output this shift: No intake/output data recorded.  Labs: No results for input(s): "HGB" in the last 72 hours. No results for input(s): "WBC", "RBC", "HCT", "PLT" in the last 72 hours. No results for input(s): "NA", "K", "CL", "CO2", "BUN", "CREATININE", "GLUCOSE", "CALCIUM" in the last 72 hours. No results for input(s): "LABPT", "INR" in the last 72 hours.  EXAM General - Patient is Alert, Appropriate, and Oriented Extremity - Neurologically intact ABD soft Neurovascular intact Sensation intact distally Intact pulses distally Dorsiflexion/Plantar flexion intact No cellulitis present Compartment soft Dressing - dressing C/D/I and scant drainage Motor Function - intact, moving foot and toes well on exam. Able to plantar and dorsi flex with good strength and ROM. Able to SLR with minor assistance. Is neurovascularly intact all dermatomes down to left lower extremity.  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   Weight as of this encounter: 87.5 kg. Advance diet Up with therapy  Patient will continue to work with physical therapy to pass postoperative PT protocols, ROM and strengthening  Hip Preacutions  Discussed with the patient continuing to utilize ice over the bandage  Patient will wear TED hose bilaterally to help prevent DVT and clot formation  Discussed the Aquacel bandage.  This bandage will stay in place 7 days postoperatively.  Can be replaced with honeycomb bandages that will be sent home with the patient  Discussed sending the patient home with tramadol and oxycodone for as needed pain management.  Patient will also be sent home with Celebrex to help with  swelling and inflammation.  Patient will continue on at home Xarelto daily for DVT prophylaxis  Weight-Bearing as tolerated to left leg  Discussed with TOC, states they talked with the patient and daughter about SNF versus going home.  TOC states that they are looking to discharge  home with HHPT. Without much  home support through out the day, patient will remain until he is able to pass his PT protocols and is safe to D/C home.  Patient will follow-up with [facility] clinic orthopedics in 6 weeks for re-imaging and reevaluation   Rayburn Go, PA-C [facility] 02/19/2023, 8:09 AM

## 2023-02-19 NOTE — Progress Notes (Signed)
Physical Therapy Treatment Patient Details Name: Dennis Savage MRN: [id number] DOB: 01-09-1930 Today's Date: 02/19/2023   History of Present Illness Pt is a 89 y.o. male s/p L THA.  Pt has a past medical history of arthritis, CHF, COPD, gastric reflux, hypertension, dizziness, LBP, and PVD.    PT Comments  Pt in chair, pre-medicated.  He is able to stand from recliner to RW with cga x 1 and cues for hand placements.  He uses post legs on chair for support to prevent post LOB.  He is able to step to bed and transition to/from supine with HOB raised to about 35 degrees without assist this session.  Discussed need to use bed features at home to assist.  Daughter has ordered a mobility pole that is tension mounted to floor/ceiling to give him a grab bar for easier transitions but it will not be delivered until tomorrow.  He is able to walk 120' towards rehab gym for step training but fatigues and needs to sit on bench in hallway before continuing to rehab gym where he again rests in chair before practicing up/down one step with walker to simulate home.  He overall does quite well with step with CGA/supervision.  Returned to room by wheelchair and opts to rest in bed and again gets into bed without assist and is able to reposition.  While mobility is improving he continues to struggle some with sit to stand transitions and he voices concern as his mattress at home is very soft and he sinks into in.  He relies heavily on bed/chair support for post legs to prevent post LOB and needs cues for hand placements as he will reach to pull up on walker about 50% of the time without cues.    If pt has +1 assist at home discharge to home is reasonable.  If pt is left alone, he is at risk for falls with sit to stand transitions and as he fatigues or pain is not managed gait quality and balance is affected.  Would recommend +1 assist at all times for mobility. He has been encouraged to get  [facility] for home but he is  declining stating he wishes to use his bathroom.  Discussed for times he is alone to increase safety but he remains firm in his wish not to have one.     If plan is discharge home, recommend the following: A little help with walking and/or transfers;A little help with bathing/dressing/bathroom;Assist for transportation;Help with stairs or ramp for entrance   Can travel by private vehicle        Equipment Recommendations   (pt and family declined BSC despite encouragement)    Recommendations for Other Services       Precautions / Restrictions Precautions Precautions: Posterior Hip Precaution Booklet Issued: Yes (comment) Restrictions Weight Bearing Restrictions: Yes LLE Weight Bearing: Weight bearing as tolerated     Mobility  Bed Mobility Overal bed mobility: Needs Assistance Bed Mobility: Supine to Sit, Sit to Supine     Supine to sit: HOB elevated, Contact guard Sit to supine: HOB elevated, Supervision   General bed mobility comments: easier this session with HOV elevated to 35 degrees Patient Response: Cooperative  Transfers Overall transfer level: Needs assistance Equipment used: Rolling walker (2 wheels) Transfers: Sit to/from Stand Sit to Stand: Contact guard assist, Min assist           General transfer comment: cues for hand placements continue and relies heavily with legs on back  of chair for support and assist.    Ambulation/Gait Ambulation/Gait assistance: Contact guard assist Gait Distance (Feet): 120 Feet Assistive device: Rolling walker (2 wheels) Gait Pattern/deviations: Step-through pattern, Antalgic, Trunk flexed Gait velocity: decreased     General Gait Details: limited by fatigue and pain   Stairs Stairs: Yes Stairs assistance: Contact guard assist Stair Management: Step to pattern, Forwards, With walker, No rails Number of Stairs: 1 General stair comments: overall does well with step with CGA.   Wheelchair Mobility     Tilt  Bed Tilt Bed Patient Response: Cooperative  Modified Rankin (Stroke Patients Only)       Balance Overall balance assessment: Needs assistance Sitting-balance support: Feet supported Sitting balance-Leahy Scale: Good     Standing balance support: Bilateral upper extremity supported, During functional activity, Reliant on assistive device for balance Standing balance-Leahy Scale: Fair Standing balance comment: generally does well once up but still struggles with sit to stand transitions with heavy lean on bed for support of post legs to prevent post fall and cues for hand placements                            Cognition Arousal: Alert Behavior During Therapy: [facility] for tasks assessed/performed Overall Cognitive Status: Within Functional Limits for tasks assessed                                          Exercises      General Comments        Pertinent Vitals/Pain Pain Assessment Pain Assessment: Faces Faces Pain Scale: Hurts little more Pain Location: improved initially with meds but increased at end of session with activity Pain Descriptors / Indicators: Aching, Sore Pain Intervention(s): Limited activity within patient's tolerance, Monitored during session, Repositioned    Home Living                          Prior Function            PT Goals (current goals can now be found in the care plan section) Progress towards PT goals: Progressing toward goals    Frequency    BID      PT Plan      Co-evaluation              AM-PAC PT "6 Clicks" Mobility   Outcome Measure  Help needed turning from your back to your side while in a flat bed without using bedrails?: A Little Help needed moving from lying on your back to sitting on the side of a flat bed without using bedrails?: A Little Help needed moving to and from a bed to a chair (including a wheelchair)?: A Little Help needed standing up from a chair using your arms  (e.g., wheelchair or bedside chair)?: A Little Help needed to walk in hospital room?: A Little Help needed climbing 3-5 steps with a railing? : A Little 6 Click Score: 18    End of Session Equipment Utilized During Treatment: Gait belt Activity Tolerance: Patient tolerated treatment well Patient left: in chair;with call bell/phone within reach;with chair alarm set;with family/visitor present Nurse Communication: Mobility status PT Visit Diagnosis: Unsteadiness on feet (R26.81);Other abnormalities of gait and mobility (R26.89);Muscle weakness (generalized) (M62.81);Difficulty in walking, not elsewhere classified (R26.2);Pain Pain - Right/Left: Left Pain - part of  body: Hip     Time: 8119-1478 PT Time Calculation (min) (ACUTE ONLY): 25 min  Charges:    $Gait Training: 8-22 mins $Therapeutic Activity: 8-22 mins PT General Charges $$ ACUTE PT VISIT: 1 Visit                   Danielle Dess, PTA 02/19/23, 11:48 AM

## 2023-02-20 ENCOUNTER — Other Ambulatory Visit: Payer: Self-pay

## 2023-02-20 DIAGNOSIS — M1612 Unilateral primary osteoarthritis, left hip: Secondary | ICD-10-CM | POA: Diagnosis not present

## 2023-02-20 MED ORDER — ORGOVYX 120 MG PO TABS: 120.0000 mg | ORAL_TABLET | Freq: Every day | ORAL | 11 refills | Status: DC

## 2023-02-20 NOTE — Plan of Care (Signed)
  Problem: Education: Goal: Knowledge of General Education information will improve Description: Including pain rating scale, medication(s)/side effects and non-pharmacologic comfort measures Outcome: Progressing   Problem: Health Behavior/Discharge Planning: Goal: Ability to manage health-related needs will improve Outcome: Progressing   Problem: Clinical Measurements: Goal: Ability to maintain clinical measurements within normal limits will improve Outcome: Progressing   Problem: Activity: Goal: Risk for activity intolerance will decrease Outcome: Progressing   Problem: Nutrition: Goal: Adequate nutrition will be maintained Outcome: Progressing   Problem: Elimination: Goal: Will not experience complications related to bowel motility Outcome: Progressing Goal: Will not experience complications related to urinary retention Outcome: Progressing   Problem: Pain Management: Goal: General experience of comfort will improve Outcome: Progressing

## 2023-02-20 NOTE — TOC Progression Note (Signed)
Transition of Care [facility]) - Progression Note    Patient Details  Name: Dennis Savage MRN: [id number] Date of Birth: 08-16-1929  Transition of Care [facility] CM/SW Contact  Marlowe Sax, RN Phone Number: 02/20/2023, 9:41 AM  Clinical Narrative:    Met with the patient to Clarify that he has all the DME he needs at home, he has a rolling walker and a raised toilet with hand grips as well as a shower seat   Expected Discharge Plan: Home w Home Health Services Barriers to Discharge: No Barriers Identified  Expected Discharge Plan and Services   Discharge Planning Services: CM Consult   Living arrangements for the past 2 months: Single Family Home Expected Discharge Date: 02/20/23               DME Arranged: N/A DME Agency: NA       HH Arranged: PT, OT HH Agency: [facility] Date HH Agency Contacted: 02/15/23 Time HH Agency Contacted: 1547 Representative spoke with at [facility] Agency: [place]   Social Determinants of Health (SDOH) Interventions SDOH Screenings   Food Insecurity: No Food Insecurity (02/01/2023)   Received from [facility]  Housing: Low Risk  (10/28/2022)  Transportation Needs: No Transportation Needs (02/01/2023)   Received from [facility]  Utilities: Not At Risk (02/01/2023)   Received from [facility]  Depression [phone]): Low Risk  (06/30/2021)  Financial Resource Strain: Low Risk  (02/01/2023)   Received from [facility]  Tobacco Use: Medium Risk (02/15/2023)    Readmission Risk Interventions     No data to display

## 2023-02-20 NOTE — Progress Notes (Signed)
Occupational Therapy Treatment Patient Details Name: Dennis Savage MRN: [id number] DOB: 11-Mar-1930 Today's Date: 02/20/2023   History of present illness Pt is a 89 y.o. male s/p L THA.  Pt has a past medical history of arthritis, CHF, COPD, gastric reflux, hypertension, dizziness, LBP, and PVD.   OT comments  Pt is supine in bed on arrival. Pleasant and agreeable to OT session. No reports of pain during session. Pt performed bed mobility with SUP using bedrails. STS from EOB with SUP to stand at sink and perform UB/LB bathing tasks and grooming tasks to comb hair and brush his teeth. SUP required for safety throughout. Pt returned to seated EOB for dressing tasks with set up assist for UB dressing and Min/Mod A for LB dressing d/t posterior hip precautions with edu to use reacher at home. Pt verbalized understanding. Pt returned to bed with all needs in place and will cont to require skilled acute OT services to maximize his safety and IND to return to PLOF.       If plan is discharge home, recommend the following:  A little help with walking and/or transfers;A little help with bathing/dressing/bathroom;Assist for transportation;Help with stairs or ramp for entrance;Assistance with cooking/housework   Equipment Recommendations  None recommended by OT    Recommendations for Other Services      Precautions / Restrictions Precautions Precautions: Posterior Hip Precaution Booklet Issued: Yes (comment) Restrictions Weight Bearing Restrictions: Yes LLE Weight Bearing: Weight bearing as tolerated       Mobility Bed Mobility Overal bed mobility: Needs Assistance Bed Mobility: Supine to Sit, Sit to Supine     Supine to sit: HOB elevated, Supervision, Used rails Sit to supine: HOB elevated, Supervision, Used rails        Transfers Overall transfer level: Needs assistance   Transfers: Sit to/from Stand Sit to Stand: Supervision           General transfer comment: SUP from  EOB via pulling on sink with one hand to stand     Balance Overall balance assessment: Needs assistance Sitting-balance support: Feet supported Sitting balance-Leahy Scale: Good     Standing balance support: During functional activity, Single extremity supported Standing balance-Leahy Scale: Fair Standing balance comment: no LOB during standing for ADLs at sink                           ADL either performed or assessed with clinical judgement   ADL Overall ADL's : Needs assistance/impaired     Grooming: Supervision/safety;Oral care;Wash/dry face;Applying deodorant;Brushing hair;Standing   Upper Body Bathing: Supervision/ safety;Standing     Lower Body Bathing Details (indicate cue type and reason): SUP to bathe peri-area and buttocks only in standing at sink Upper Body Dressing : Supervision/safety;Sitting   Lower Body Dressing: Minimal assistance;Moderate assistance;Sit to/from stand Lower Body Dressing Details (indicate cue type and reason): 2/2 Posterior hip precautions assist to get pants over feet and within reach for pt                    Extremity/Trunk Assessment     Lower Extremity Assessment LLE Deficits / Details: s/p THA        Vision       Perception     Praxis      Cognition Arousal: Alert Behavior During Therapy: [facility] for tasks assessed/performed Overall Cognitive Status: Within Functional Limits for tasks assessed  General Comments: Pt is A and O x 4        Exercises Other Exercises Other Exercises: Re-educated on use of long handled sponge for LB bathing tasks and sitting on shower chair in front of sink to perform bathing to maximize safety as well as use of reacher for LB dressing.    Shoulder Instructions       General Comments      Pertinent Vitals/ Pain       Pain Assessment Pain Assessment: Faces Faces Pain Scale: Hurts a little bit Pain Location: L hip Pain  Descriptors / Indicators: Aching, Sore Pain Intervention(s): Monitored during session, Limited activity within patient's tolerance  Home Living                                          Prior Functioning/Environment              Frequency  Min 1X/week        Progress Toward Goals  OT Goals(current goals can now be found in the care plan section)  Progress towards OT goals: Progressing toward goals  Acute Rehab OT Goals Patient Stated Goal: return home OT Goal Formulation: With patient Time For Goal Achievement: 03/02/23 Potential to Achieve Goals: Good  Plan      Co-evaluation                 AM-PAC OT "6 Clicks" Daily Activity     Outcome Measure   Help from another person eating meals?: None Help from another person taking care of personal grooming?: None Help from another person toileting, which includes using toliet, bedpan, or urinal?: A Little Help from another person bathing (including washing, rinsing, drying)?: A Little Help from another person to put on and taking off regular upper body clothing?: None Help from another person to put on and taking off regular lower body clothing?: A Little 6 Click Score: 21    End of Session Equipment Utilized During Treatment: Rolling walker (2 wheels)  OT Visit Diagnosis: Unsteadiness on feet (R26.81)   Activity Tolerance Patient tolerated treatment well   Patient Left with call bell/phone within reach;in bed;with bed alarm set   Nurse Communication Mobility status        Time: 1610-9604 OT Time Calculation (min): 12 min  Charges: OT General Charges $OT Visit: 1 Visit OT Treatments $Self Care/Home Management : 8-22 mins  [name], OTR/L  02/20/23, 12:04 PM   [name] E [name] 02/20/2023, 12:02 PM

## 2023-02-20 NOTE — Progress Notes (Signed)
Subjective: 5 Days Post-Op Procedure(s) (LRB): TOTAL HIP ARTHROPLASTY (Left) Patient reports pain as mild.   Patient seen in rounds with Dr. Ernest Pine. Patient is well, and has had no acute complaints or problems. Denies any CP, SOB, N/V, fevers or chills.  Has successfully had a BM yesterday.  We will continue with therapy today.  Plan is to go home after hospital stay.  Objective: Vital signs in last 24 hours: Temp:  [97.8 F (36.6 C)-99 F (37.2 C)] 99 F (37.2 C) (11/05 2350) Pulse Rate:  [54-107] 107 (11/05 2350) Resp:  [16-18] 18 (11/05 2350) BP: (136-159)/(64-66) 159/64 (11/05 2350) SpO2:  [95 %-97 %] 97 % (11/05 2350)  Intake/Output from previous day: No intake or output data in the 24 hours ending 02/20/23 0803   Intake/Output this shift: No intake/output data recorded.  Labs: No results for input(s): "HGB" in the last 72 hours. No results for input(s): "WBC", "RBC", "HCT", "PLT" in the last 72 hours. No results for input(s): "NA", "K", "CL", "CO2", "BUN", "CREATININE", "GLUCOSE", "CALCIUM" in the last 72 hours. No results for input(s): "LABPT", "INR" in the last 72 hours.  EXAM General - Patient is Alert, Appropriate, and Oriented Extremity - Neurologically intact ABD soft Neurovascular intact Sensation intact distally Intact pulses distally Dorsiflexion/Plantar flexion intact No cellulitis present Compartment soft Dressing - dressing C/D/I and scant drainage Motor Function - intact, moving foot and toes well on exam. Able to plantar and dorsi flex with good strength and ROM. Able to SLR with minor assistance. Is neurovascularly intact all dermatomes down to left lower extremity.  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   Weight as of this encounter: 87.5 kg. Advance diet Up with therapy  Patient will continue to work with physical therapy to pass postoperative PT protocols, ROM and strengthening  Hip Preacutions  Discussed with the patient continuing to utilize ice over the bandage  Patient will wear TED hose bilaterally to help prevent DVT and clot formation  Discussed the Aquacel bandage.  This bandage will stay in place 7 days postoperatively.  Can be replaced with honeycomb bandages that will be sent home with the patient  Discussed sending the patient home with tramadol and oxycodone for as needed pain management.  Patient will also be sent home with Celebrex to help with swelling and inflammation.  Patient will continue on at home Xarelto daily for DVT prophylaxis  Weight-Bearing as tolerated  to left leg  Plan to DC today after working with PT this morning.  Patient will follow-up with [facility] [facility] [facility] clinic orthopedics in 6 weeks for re-imaging and reevaluation   Rayburn Go, PA-C [facility] 02/20/2023, 8:03 AM

## 2023-02-20 NOTE — Progress Notes (Addendum)
Physical Therapy Treatment Patient Details Name: Dennis Savage MRN: [id number] DOB: 1929-05-14 Today's Date: 02/20/2023   History of Present Illness Pt is a 89 y.o. male s/p L THA.  Pt has a past medical history of arthritis, CHF, COPD, gastric reflux, hypertension, dizziness, LBP, and PVD.    PT Comments  Author returned for 2nd session. Pt was sitting in recliner an agreeable to session. Easily and safely able to stand and ambulate > 200 ft without LOB. No assistance required to stand form recliner surface.  Pt demonstrated abilities to return to bed without assistance. Pt states he feels better about going home today but still apprehensive. Acute PT will continue to follow and progress per current POC. Reviewed importance of routine mobility, hip precautions, and HEP performance.    If plan is discharge home, recommend the following: A little help with walking and/or transfers;A little help with bathing/dressing/bathroom;Assist for transportation;Help with stairs or ramp for entrance     Equipment Recommendations  None recommended by PT       Precautions / Restrictions Precautions Precautions: Posterior Hip Precaution Booklet Issued: Yes (comment) Restrictions Weight Bearing Restrictions: Yes LLE Weight Bearing: Weight bearing as tolerated     Mobility  Bed Mobility Overal bed mobility: Needs Assistance Bed Mobility: Supine to Sit, Sit to Supine  Supine to sit: HOB elevated, Supervision, Used rails Sit to supine: HOB elevated, Supervision, Used rails General bed mobility comments: no physical assistance required to return to long sitting fronm EOB short sit.    Transfers Overall transfer level: Needs assistance Equipment used: Rolling walker (2 wheels) Transfers: Sit to/from Stand Sit to Stand: Supervision  General transfer comment: no physical assistance to stand from recliner height surface.    Ambulation/Gait Ambulation/Gait assistance: Supervision Gait Distance  (Feet): 200 Feet Assistive device: Rolling walker (2 wheels) Gait Pattern/deviations: Step-through pattern Gait velocity: decreased  General Gait Details: pt easily and safely ambulates 200 ft with RW    Balance Overall balance assessment: Needs assistance Sitting-balance support: Feet supported Sitting balance-Leahy Scale: Good     Standing balance support: During functional activity, Single extremity supported Standing balance-Leahy Scale: Fair Standing balance comment: no LOB with RW use     Cognition Arousal: Alert Behavior During Therapy: WFL for tasks assessed/performed Overall Cognitive Status: Within Functional Limits for tasks assessed    General Comments: Pt is A and O x 4               Pertinent Vitals/Pain Pain Assessment Pain Assessment: 0-10 Pain Score: 2  Pain Descriptors / Indicators: Aching, Sore Pain Intervention(s): Limited activity within patient's tolerance, Monitored during session, Premedicated before session, Repositioned     PT Goals (current goals can now be found in the care plan section) Acute Rehab PT Goals Patient Stated Goal: go home when daughter is able. Progress towards PT goals: Progressing toward goals    Frequency    BID       AM-PAC PT "6 Clicks" Mobility   Outcome Measure  Help needed turning from your back to your side while in a flat bed without using bedrails?: A Little Help needed moving from lying on your back to sitting on the side of a flat bed without using bedrails?: A Little Help needed moving to and from a bed to a chair (including a wheelchair)?: A Little Help needed standing up from a chair using your arms (e.g., wheelchair or bedside chair)?: A Little Help needed to walk in hospital room?: A Little  Help needed climbing 3-5 steps with a railing? : A Little 6 Click Score: 18    End of Session Equipment Utilized During Treatment: Gait belt Activity Tolerance: Patient tolerated treatment well Patient  left: in bed;with call bell/phone within reach;with bed alarm set Nurse Communication: Mobility status PT Visit Diagnosis: Unsteadiness on feet (R26.81);Other abnormalities of gait and mobility (R26.89);Muscle weakness (generalized) (M62.81);Difficulty in walking, not elsewhere classified (R26.2);Pain Pain - Right/Left: Left Pain - part of body: Hip     Time: 1610-9604 PT Time Calculation (min) (ACUTE ONLY): 19 min  Charges:    $Gait Training: 8-22 mins $Therapeutic Activity: 8-22 mins PT General Charges $$ ACUTE PT VISIT: 1 Visit                     Jetta Lout PTA 02/20/23, 11:37 AM

## 2023-02-20 NOTE — Plan of Care (Signed)

## 2023-02-20 NOTE — Discharge Summary (Signed)
Physician Discharge Summary  Subjective: 5 Days Post-Op Procedure(s) (LRB): TOTAL HIP ARTHROPLASTY (Left) Patient reports pain as mild.   Patient seen in rounds with Dr. Ernest Pine. Patient is well, and has had no acute complaints or problems. Denies any CP, SOB, N/V, fevers or chills.  Has successfully had a BM 2 days ago.  We will continue with therapy today Patient is ready to go home  Physician Discharge Summary  Patient ID: Dennis Savage MRN: [id number] DOB/AGE: 23-Sep-1929 89 y.o.  Admit date: 02/15/2023 Discharge date: 02/20/2023  Admission Diagnoses:  Discharge Diagnoses:  Principal Problem:   Total knee replacement status   Discharged Condition: fair  Hospital Course: Patient presented to the hospital on 02/15/2023 for an elective left total hip arthroplasty performed by Dr. Ernest Pine.  Patient tolerated the procedure well without any complications.  He was given 1 g of TXA and 2 g of Ancef perioperatively.  See procedural note for details below.  Postoperatively, patient had some difficulty passing his PT protocols, specifically safely getting up and down from a chair or bed.  He was held over the weekend and for the first few days of the week to work on physical therapy.  He has progressed well with PT.  Deemed to be safe at home, so long as he has someone from his family there to help him for the first few days.  TOC was not able to find any SNF or rehab facility for this patient as he did not qualify.  He did have some difficulties with bowel retention, although on postop day 4 was able to move his bowels.  His pain has been well-controlled utilizing oral opioid analgesic medications.  His vital signs have been stable.  Patient is stable for discharge home.  PROCEDURE:  Left total hip arthroplasty   SURGEON:  Jena Gauss. M.D.   ASSISTANT:  Gean Birchwood, PA-C (present and scrubbed throughout the case, critical for assistance with exposure, retraction, instrumentation, and  closure)   ANESTHESIA: spinal   ESTIMATED BLOOD LOSS: 75 mL   FLUIDS REPLACED: 1000 mL of crystalloid   DRAINS: 2 medium Hemovac drains   IMPLANTS UTILIZED: DePuy size 6 high offset Actis femoral stem, 58 mm OD Pinnacle 100 acetabular component, +4 mm in degree Pinnacle Marathon polyethylene insert, and a 36 mm M-SPEC +1.5 mm hip ball  Treatments: none  Discharge Exam: Blood pressure (!) 159/64, pulse (!) 107, temperature 99 F (37.2 C), resp. rate 18, height 5' 9.5" (1.765 m), weight 87.5 kg, SpO2 97%.   Disposition: home   Allergies as of 02/20/2023       Reactions   Lisinopril Cough   Penicillins Hives, Swelling   IgE = 17 ((WNL) on 02/08/2023        Medication List     TAKE these medications    acetaminophen 325 MG tablet Commonly known as: TYLENOL Take 650 mg by mouth every 6 (six) hours as needed.   albuterol 108 (90 Base) MCG/ACT inhaler Commonly known as: VENTOLIN HFA Inhale 2 puffs into the lungs every 6 (six) hours as needed for wheezing or shortness of breath.   amLODipine 10 MG tablet Commonly known as: NORVASC Take 10 mg by mouth daily.   CALCIUM 600 + D PO Take 1 tablet by mouth daily.   celecoxib 200 MG capsule Commonly known as: CELEBREX Take 1 capsule (200 mg total) by mouth 2 (two) times daily.   chlorthalidone 25 MG tablet Commonly known as: HYGROTON Take 25  mg by mouth 2 (two) times a week. Patient takes only on Sunday and Wednesday   cholecalciferol 25 MCG (1000 UNIT) tablet Commonly known as: VITAMIN D3 Take 1,000 Units by mouth daily.   docusate sodium 100 MG capsule Commonly known as: COLACE Take 100 mg by mouth 2 (two) times daily.   doxycycline 100 MG tablet Commonly known as: VIBRA-TABS Take 100 mg by mouth 2 (two) times daily.   Fish Oil 300 MG Caps Take 300 mg by mouth daily.   omeprazole 20 MG capsule Commonly known as: PRILOSEC Take 20 mg by mouth daily.   Orgovyx 120 MG tablet Generic drug: relugolix TAKE  1 TABLET BY MOUTH ONCE DAILY. What changed:  how much to take when to take this   oxyCODONE 5 MG immediate release tablet Commonly known as: Oxy IR/ROXICODONE Take 1 tablet (5 mg total) by mouth every 4 (four) hours as needed for moderate pain (pain score 4-6) (pain score 4-6).   PRESERVISION AREDS 2 PO Take 1 capsule by mouth 2 (two) times daily.   traMADol 50 MG tablet Commonly known as: ULTRAM Take 50 mg by mouth 2 (two) times daily as needed for moderate pain (pain score 4-6). What changed: Another medication with the same name was added. Make sure you understand how and when to take each.   traMADol 50 MG tablet Commonly known as: ULTRAM Take 1-2 tablets (50-100 mg total) by mouth every 4 (four) hours as needed for moderate pain (pain score 4-6). What changed: You were already taking a medication with the same name, and this prescription was added. Make sure you understand how and when to take each.   Trelegy Ellipta 100-62.5-25 MCG/ACT Aepb Generic drug: Fluticasone-Umeclidin-Vilant Inhale 1 Inhalation into the lungs at bedtime.   Xarelto 20 MG Tabs tablet Generic drug: rivaroxaban Take 1 tablet by mouth daily with supper.               Durable Medical Equipment  (From admission, onward)           Start     Ordered   02/15/23 1245  DME Walker rolling  Once       Question:  Patient needs a walker to treat with the following condition  Answer:  S/P total hip arthroplasty   02/15/23 1244   02/15/23 1245  DME Bedside commode  Once       Comments: Patient is not able to walk the distance required to go the bathroom, or he/she is unable to safely negotiate stairs required to access the bathroom.  A 3in1 BSC will alleviate this problem  Question:  Patient needs a bedside commode to treat with the following condition  Answer:  S/P total hip arthroplasty   02/15/23 1244            Follow-up Information     Hooten, Illene Labrador, MD Follow up on 04/02/2023.    Specialty: Orthopedic Surgery Why: at 2:45pm Contact information: [address] [facility] [place] [address] [phone]                 Signed: Gean Birchwood 02/20/2023, 8:11 AM   Objective: Vital signs in last 24 hours: Temp:  [97.8 F (36.6 C)-99 F (37.2 C)] 99 F (37.2 C) (11/05 2350) Pulse Rate:  [54-107] 107 (11/05 2350) Resp:  [16-18] 18 (11/05 2350) BP: (136-159)/(64-66) 159/64 (11/05 2350) SpO2:  [95 %-97 %] 97 % (11/05 2350)  Intake/Output from previous day: No intake  or output data in the 24 hours ending 02/20/23 0811  Intake/Output this shift: No intake/output data recorded.  Labs: No results for input(s): "HGB" in the last 72 hours. No results for input(s): "WBC", "RBC", "HCT", "PLT" in the last 72 hours. No results for input(s): "NA", "K", "CL", "CO2", "BUN", "CREATININE", "GLUCOSE", "CALCIUM" in the last 72 hours. No results for input(s): "LABPT", "INR" in the last 72 hours.  EXAM: General - Patient is Alert, Appropriate, and Oriented Extremity - Neurologically intact ABD soft Neurovascular intact Sensation intact distally Intact pulses distally Dorsiflexion/Plantar flexion intact No cellulitis present Compartment soft Dressing - dressing C/D/I and scant drainage Motor Function - intact, moving foot and toes well on exam. Able to plantar and dorsi flex with good strength and ROM. Able to SLR with minor assistance. Is neurovascularly intact all dermatomes down to left lower extremity.  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   Apnea, sleep 07/13/2015   Arthralgia of multiple joints 06/15/2011   Arthritis     Artificial lens present 12/08/2010   Atrial fibrillation (HCC)    Benign essential HTN 11/03/2010   CHF (congestive heart failure) (HCC)    Chronic obstructive pulmonary disease (HCC) 11/03/2010   CN (constipation) 06/15/2011   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   Weight as of this encounter: 87.5 kg.  Patient has been deemed safe to go home to transition to working with home health physical therapy so long as he has someone else at home to look after him in the coming days.  Continue to work on strength and gait with HHPT.  Hip Preacutions   Discussed with the patient continuing to utilize ice over the bandage   Patient will wear TED hose bilaterally to help prevent DVT and clot formation   Discussed the Aquacel bandage.  This bandage will stay in place 7 days postoperatively.  Can be replaced with honeycomb bandages that will be sent home with the patient   Discussed sending the patient home with tramadol and oxycodone for as needed pain management.  Patient will also be sent home with Celebrex to help with swelling and inflammation.  Patient will continue on at home Xarelto daily for DVT prophylaxis   Weight-Bearing as tolerated to left leg   Patient will follow-up with [facility] clinic orthopedics in 6 weeks for re-imaging and reevaluation  Diet - Regular diet Follow up - in 6 weeks Activity - WBAT Disposition - Home Condition Upon Discharge - Stable DVT Prophylaxis - Xarelto and TED hose  Danise Edge,  PA-C Orthopaedic Surgery 02/20/2023, 8:11 AM

## 2023-02-20 NOTE — Progress Notes (Signed)
Physical Therapy Treatment Patient Details Name: Dennis Savage MRN: [id number] DOB: 1929/09/23 Today's Date: 02/20/2023   History of Present Illness Pt is a 89 y.o. male s/p L THA.  Pt has a past medical history of arthritis, CHF, COPD, gastric reflux, hypertension, dizziness, LBP, and PVD.    PT Comments  Pt was A but voices frustration throughout session. He did not want to perform session at this time but requested assistance to BR. Pt was able to exit bed without physical assistance. He stood form lowest bed height with supervision only. Easily and safely able to ambulate to BR. Independently performed transfer on/off toilet( BSC placed over). Pt unwilling to continue session until he eats breakfast. Thereasa Parkin will return as requested and continue to follow per current POC.     If plan is discharge home, recommend the following: A little help with walking and/or transfers;A little help with bathing/dressing/bathroom;Assist for transportation;Help with stairs or ramp for entrance     Equipment Recommendations  None recommended by PT       Precautions / Restrictions Precautions Precautions: Posterior Hip Precaution Booklet Issued: Yes (comment) Restrictions Weight Bearing Restrictions: Yes LLE Weight Bearing: Weight bearing as tolerated     Mobility  Bed Mobility Overal bed mobility: Needs Assistance Bed Mobility: Supine to Sit, Sit to Supine  Supine to sit: HOB elevated, Supervision, Used rails  General bed mobility comments: pt got OOB on R side for this session and required SUP using bed rails, no physical assist required    Transfers Overall transfer level: Needs assistance Equipment used: Rolling walker (2 wheels) Transfers: Sit to/from Stand Sit to Stand: Supervision  General transfer comment: pt stood from lowest bed height with supervision only    Ambulation/Gait Ambulation/Gait assistance: Supervision Gait Distance (Feet): 40 Feet Assistive device: Rolling walker  (2 wheels) Gait Pattern/deviations: Step-through pattern, Antalgic, Trunk flexed Gait velocity: decreased  General Gait Details: distance only limited by pt wanting to eat his breakfast. Author will return shortly to advance gait distances.   Balance Overall balance assessment: Needs assistance Sitting-balance support: Feet supported Sitting balance-Leahy Scale: Good     Standing balance support: During functional activity, Single extremity supported Standing balance-Leahy Scale: Fair Standing balance comment:  (no LOB with use of RW)      Cognition Arousal: Alert Behavior During Therapy: WFL for tasks assessed/performed Overall Cognitive Status: Within Functional Limits for tasks assessed      General Comments: Pt is A but frustrated about DCing. He states he does not feel he is ready.               Pertinent Vitals/Pain Pain Assessment Pain Assessment: No/denies pain Pain Score: 0-No pain     PT Goals (current goals can now be found in the care plan section) Acute Rehab PT Goals Patient Stated Goal: get better and go home Progress towards PT goals: Progressing toward goals    Frequency    BID       AM-PAC PT "6 Clicks" Mobility   Outcome Measure  Help needed turning from your back to your side while in a flat bed without using bedrails?: A Little Help needed moving from lying on your back to sitting on the side of a flat bed without using bedrails?: A Little Help needed moving to and from a bed to a chair (including a wheelchair)?: A Little Help needed standing up from a chair using your arms (e.g., wheelchair or bedside chair)?: A Little Help needed to walk  in hospital room?: A Little Help needed climbing 3-5 steps with a railing? : A Little 6 Click Score: 18    End of Session   Activity Tolerance: Patient tolerated treatment well Patient left: in chair;with call bell/phone within reach;with chair alarm set;with family/visitor present Nurse  Communication: Mobility status PT Visit Diagnosis: Unsteadiness on feet (R26.81);Other abnormalities of gait and mobility (R26.89);Muscle weakness (generalized) (M62.81);Difficulty in walking, not elsewhere classified (R26.2);Pain Pain - Right/Left: Left Pain - part of body: Hip     Time: 0820-0845 PT Time Calculation (min) (ACUTE ONLY): 25 min  Charges:    $Gait Training: 8-22 mins $Therapeutic Activity: 8-22 mins PT General Charges $$ ACUTE PT VISIT: 1 Visit                    Jetta Lout PTA 02/20/23, 9:04 AM

## 2023-04-05 ENCOUNTER — Other Ambulatory Visit: Payer: Self-pay

## 2023-04-05 ENCOUNTER — Emergency Department: Payer: Medicare Other

## 2023-04-05 ENCOUNTER — Inpatient Hospital Stay
Admission: EM | Admit: 2023-04-05 | Discharge: 2023-04-12 | DRG: 193 | Disposition: A | Discharge status: Skilled Nursing Facility | Payer: Medicare Other | Source: Ambulatory Visit | Attending: Internal Medicine | Admitting: Internal Medicine

## 2023-04-05 DIAGNOSIS — Z888 Allergy status to other drugs, medicaments and biological substances status: Secondary | ICD-10-CM

## 2023-04-05 DIAGNOSIS — Z880 Allergy status to penicillin: Secondary | ICD-10-CM

## 2023-04-05 DIAGNOSIS — N1831 Chronic kidney disease, stage 3a: Secondary | ICD-10-CM | POA: Diagnosis present

## 2023-04-05 DIAGNOSIS — I130 Hypertensive heart and chronic kidney disease with heart failure and stage 1 through stage 4 chronic kidney disease, or unspecified chronic kidney disease: Secondary | ICD-10-CM | POA: Diagnosis present

## 2023-04-05 DIAGNOSIS — Z96653 Presence of artificial knee joint, bilateral: Secondary | ICD-10-CM | POA: Diagnosis present

## 2023-04-05 DIAGNOSIS — Z87891 Personal history of nicotine dependence: Secondary | ICD-10-CM | POA: Diagnosis not present

## 2023-04-05 DIAGNOSIS — E278 Other specified disorders of adrenal gland: Secondary | ICD-10-CM | POA: Diagnosis present

## 2023-04-05 DIAGNOSIS — R7989 Other specified abnormal findings of blood chemistry: Secondary | ICD-10-CM | POA: Diagnosis not present

## 2023-04-05 DIAGNOSIS — E876 Hypokalemia: Secondary | ICD-10-CM | POA: Diagnosis not present

## 2023-04-05 DIAGNOSIS — I480 Paroxysmal atrial fibrillation: Secondary | ICD-10-CM | POA: Diagnosis present

## 2023-04-05 DIAGNOSIS — E669 Obesity, unspecified: Secondary | ICD-10-CM | POA: Diagnosis present

## 2023-04-05 DIAGNOSIS — I10 Essential (primary) hypertension: Secondary | ICD-10-CM | POA: Diagnosis not present

## 2023-04-05 DIAGNOSIS — E279 Disorder of adrenal gland, unspecified: Secondary | ICD-10-CM | POA: Diagnosis present

## 2023-04-05 DIAGNOSIS — I5033 Acute on chronic diastolic (congestive) heart failure: Secondary | ICD-10-CM | POA: Diagnosis present

## 2023-04-05 DIAGNOSIS — Z79899 Other long term (current) drug therapy: Secondary | ICD-10-CM

## 2023-04-05 DIAGNOSIS — N179 Acute kidney failure, unspecified: Secondary | ICD-10-CM | POA: Diagnosis present

## 2023-04-05 DIAGNOSIS — G4733 Obstructive sleep apnea (adult) (pediatric): Secondary | ICD-10-CM | POA: Diagnosis present

## 2023-04-05 DIAGNOSIS — R0603 Acute respiratory distress: Secondary | ICD-10-CM | POA: Diagnosis present

## 2023-04-05 DIAGNOSIS — J440 Chronic obstructive pulmonary disease with acute lower respiratory infection: Secondary | ICD-10-CM | POA: Diagnosis present

## 2023-04-05 DIAGNOSIS — J210 Acute bronchiolitis due to respiratory syncytial virus: Secondary | ICD-10-CM | POA: Diagnosis not present

## 2023-04-05 DIAGNOSIS — C61 Malignant neoplasm of prostate: Secondary | ICD-10-CM | POA: Diagnosis present

## 2023-04-05 DIAGNOSIS — J441 Chronic obstructive pulmonary disease with (acute) exacerbation: Secondary | ICD-10-CM | POA: Diagnosis present

## 2023-04-05 DIAGNOSIS — Z96643 Presence of artificial hip joint, bilateral: Secondary | ICD-10-CM | POA: Diagnosis present

## 2023-04-05 DIAGNOSIS — C799 Secondary malignant neoplasm of unspecified site: Secondary | ICD-10-CM | POA: Diagnosis present

## 2023-04-05 DIAGNOSIS — R197 Diarrhea, unspecified: Secondary | ICD-10-CM | POA: Diagnosis not present

## 2023-04-05 DIAGNOSIS — J121 Respiratory syncytial virus pneumonia: Secondary | ICD-10-CM | POA: Diagnosis present

## 2023-04-05 DIAGNOSIS — Z720 Tobacco use: Secondary | ICD-10-CM | POA: Diagnosis present

## 2023-04-05 DIAGNOSIS — K219 Gastro-esophageal reflux disease without esophagitis: Secondary | ICD-10-CM | POA: Diagnosis present

## 2023-04-05 DIAGNOSIS — J9601 Acute respiratory failure with hypoxia: Secondary | ICD-10-CM | POA: Diagnosis present

## 2023-04-05 DIAGNOSIS — Z1152 Encounter for screening for COVID-19: Secondary | ICD-10-CM | POA: Diagnosis not present

## 2023-04-05 DIAGNOSIS — R001 Bradycardia, unspecified: Secondary | ICD-10-CM | POA: Diagnosis not present

## 2023-04-05 DIAGNOSIS — Z7951 Long term (current) use of inhaled steroids: Secondary | ICD-10-CM

## 2023-04-05 DIAGNOSIS — I5032 Chronic diastolic (congestive) heart failure: Secondary | ICD-10-CM | POA: Diagnosis present

## 2023-04-05 DIAGNOSIS — I2489 Other forms of acute ischemic heart disease: Secondary | ICD-10-CM | POA: Diagnosis present

## 2023-04-05 DIAGNOSIS — Z7901 Long term (current) use of anticoagulants: Secondary | ICD-10-CM | POA: Diagnosis not present

## 2023-04-05 DIAGNOSIS — R531 Weakness: Secondary | ICD-10-CM

## 2023-04-05 DIAGNOSIS — I739 Peripheral vascular disease, unspecified: Secondary | ICD-10-CM | POA: Diagnosis present

## 2023-04-05 DIAGNOSIS — J449 Chronic obstructive pulmonary disease, unspecified: Secondary | ICD-10-CM | POA: Diagnosis present

## 2023-04-05 LAB — CBC
HCT: 40.6 % (ref 39.0–52.0)
Hemoglobin: 13.1 g/dL (ref 13.0–17.0)
MCH: 30.1 pg (ref 26.0–34.0)
MCHC: 32.3 g/dL (ref 30.0–36.0)
MCV: 93.3 fL (ref 80.0–100.0)
Platelets: 200 10*3/uL (ref 150–400)
RBC: 4.35 MIL/uL (ref 4.22–5.81)
RDW: 13.2 % (ref 11.5–15.5)
WBC: 5.2 10*3/uL (ref 4.0–10.5)
nRBC: 0 % (ref 0.0–0.2)

## 2023-04-05 LAB — BASIC METABOLIC PANEL
Anion gap: 10 (ref 5–15)
BUN: 28 mg/dL — ABNORMAL HIGH (ref 8–23)
CO2: 25 mmol/L (ref 22–32)
Calcium: 8.9 mg/dL (ref 8.9–10.3)
Chloride: 98 mmol/L (ref 98–111)
Creatinine, Ser: 1.42 mg/dL — ABNORMAL HIGH (ref 0.61–1.24)
GFR, Estimated: 47 mL/min — ABNORMAL LOW (ref >60)
Glucose, Bld: 132 mg/dL — ABNORMAL HIGH (ref 70–99)
Potassium: 3.6 mmol/L (ref 3.5–5.1)
Sodium: 133 mmol/L — ABNORMAL LOW (ref 135–145)

## 2023-04-05 LAB — RESP PANEL BY RT-PCR (RSV, FLU A&B, COVID)  RVPGX2
Influenza A by PCR: NEGATIVE
Influenza B by PCR: NEGATIVE
Resp Syncytial Virus by PCR: POSITIVE — AB
SARS Coronavirus 2 by RT PCR: NEGATIVE

## 2023-04-05 LAB — LACTIC ACID, PLASMA
Lactic Acid, Venous: 1.2 mmol/L (ref 0.5–1.9)
Lactic Acid, Venous: 1.2 mmol/L (ref 0.5–1.9)

## 2023-04-05 LAB — BRAIN NATRIURETIC PEPTIDE: B Natriuretic Peptide: 948.3 pg/mL — ABNORMAL HIGH (ref 0.0–100.0)

## 2023-04-05 LAB — TROPONIN I (HIGH SENSITIVITY)
Troponin I (High Sensitivity): 133 ng/L (ref <18)
Troponin I (High Sensitivity): 137 ng/L (ref <18)

## 2023-04-05 MED ORDER — GUAIFENESIN ER 600 MG PO TB12
600.0000 mg | ORAL_TABLET | Freq: Two times a day (BID) | ORAL | Status: DC
  Administered 2023-04-05 – 2023-04-12 (×14): 600 mg via ORAL
  Filled 2023-04-05 (×14): qty 1

## 2023-04-05 MED ORDER — IOHEXOL 350 MG/ML SOLN
75.0000 mL | Freq: Once | INTRAVENOUS | Status: AC | PRN
  Administered 2023-04-05: 75 mL via INTRAVENOUS

## 2023-04-05 MED ORDER — FLUTICASONE FUROATE-VILANTEROL 100-25 MCG/ACT IN AEPB
1.0000 | INHALATION_SPRAY | Freq: Every day | RESPIRATORY_TRACT | Status: DC
  Administered 2023-04-06 – 2023-04-12 (×7): 1 via RESPIRATORY_TRACT
  Filled 2023-04-05: qty 28

## 2023-04-05 MED ORDER — UMECLIDINIUM BROMIDE 62.5 MCG/ACT IN AEPB
1.0000 | INHALATION_SPRAY | Freq: Every day | RESPIRATORY_TRACT | Status: DC
Start: 2023-04-06 — End: 2023-04-12
  Administered 2023-04-06 – 2023-04-12 (×7): 1 via RESPIRATORY_TRACT
  Filled 2023-04-05: qty 7

## 2023-04-05 MED ORDER — IPRATROPIUM-ALBUTEROL 0.5-2.5 (3) MG/3ML IN SOLN
3.0000 mL | Freq: Once | RESPIRATORY_TRACT | Status: AC
  Administered 2023-04-05: 3 mL via RESPIRATORY_TRACT
  Filled 2023-04-05: qty 3

## 2023-04-05 MED ORDER — BENZONATATE 100 MG PO CAPS
100.0000 mg | ORAL_CAPSULE | Freq: Three times a day (TID) | ORAL | Status: DC | PRN
  Administered 2023-04-07 – 2023-04-11 (×5): 100 mg via ORAL
  Filled 2023-04-05 (×6): qty 1

## 2023-04-05 MED ORDER — IPRATROPIUM-ALBUTEROL 0.5-2.5 (3) MG/3ML IN SOLN
3.0000 mL | Freq: Four times a day (QID) | RESPIRATORY_TRACT | Status: AC
  Administered 2023-04-05 – 2023-04-06 (×4): 3 mL via RESPIRATORY_TRACT
  Filled 2023-04-05 (×4): qty 3

## 2023-04-05 MED ORDER — FUROSEMIDE 10 MG/ML IJ SOLN
40.0000 mg | Freq: Once | INTRAMUSCULAR | Status: AC
  Administered 2023-04-05: 40 mg via INTRAVENOUS
  Filled 2023-04-05: qty 4

## 2023-04-05 MED ORDER — MELATONIN 5 MG PO TABS
5.0000 mg | ORAL_TABLET | Freq: Every day | ORAL | Status: DC
Start: 2023-04-05 — End: 2023-04-12
  Administered 2023-04-05 – 2023-04-11 (×7): 5 mg via ORAL
  Filled 2023-04-05 (×7): qty 1

## 2023-04-05 MED ORDER — RIVAROXABAN 20 MG PO TABS: 20.0000 mg | ORAL_TABLET | Freq: Every day | ORAL | Status: DC

## 2023-04-05 MED ORDER — ALBUTEROL SULFATE (2.5 MG/3ML) 0.083% IN NEBU
2.5000 mg | INHALATION_SOLUTION | RESPIRATORY_TRACT | Status: DC | PRN
  Administered 2023-04-07 – 2023-04-10 (×5): 2.5 mg via RESPIRATORY_TRACT
  Filled 2023-04-05 (×5): qty 3

## 2023-04-05 NOTE — H&P (Incomplete)
PCP:   Gracelyn Nurse, MD   Chief Complaint:  Shortness of breath  HPI: This is a 89 year old male with past medical history significant for HTN, atrial fibrillation, HTN, COPD, peripheral vascular disease, CHF, OSA.  Patient recently admitted 11/1-11/6 for left hip replacement done.  Patient discharged to skilled nursing facility.  Per patient he has been [place] well postoperatively.  He states he has had no pain and doing well with PT.  On Sunday he developed cough, wheezing, shortness of breath.  He states is a bit lightheaded.  He denies chest pains.  He denies fever but endorses occasional chills.  He reports intermittent diarrhea, including today.  He denies nausea, or vomiting.  He came to the ER because of his respiratory issues.  In the ER presenting vital stable 125/89, 65, 28, Tmax 99.6.  Satting 89% on room air.  Placed on 2 L.  RSV positive, BNP 948 baseline 438 on 10/28/2022.  Troponin 133 => 137, lactic acid 1.2.  CT chest showed diffuse bilateral bronchial wall thickening and extensive heterogeneous and ground-glass airspace opacity throughout the lung bases, consistent with infection or aspiration.  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   Dizziness 02/25/2015   GERD (gastroesophageal reflux disease)    Heart murmur    Hypertension    Interstitial lung disease (HCC) 10/13/2013   LBP (low back pain) 11/03/2010   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    Peripheral vascular disease (HCC) 11/03/2010   Retinal hemorrhage 03/16/2014   Past Surgical History:  Procedure Laterality Date   APPENDECTOMY     BACK SURGERY     CARPAL TUNNEL RELEASE Bilateral    EYE SURGERY Bilateral    Cataract Extraction with IOL   HERNIA REPAIR     Umbilical Hernia X 3   JOINT REPLACEMENT     bilat knees   NASAL SINUS SURGERY     REPLACEMENT TOTAL KNEE Bilateral    SHOULDER SURGERY Right    TONSILLECTOMY     TOTAL HIP ARTHROPLASTY Right 10/12/2015   Procedure: TOTAL HIP ARTHROPLASTY;  Surgeon: Donato Heinz, MD;  Location: [facility] ORS;  Service: Orthopedics;  Laterality: Right;   TOTAL HIP ARTHROPLASTY Left 02/15/2023   Procedure: TOTAL HIP ARTHROPLASTY;  Surgeon: Donato Heinz, MD;  Location: [facility] ORS;  Service: Orthopedics;  Laterality: Left;    Medications: Prior to Admission medications   Medication Sig Start Date End Date Taking? Authorizing Provider  acetaminophen (TYLENOL) 325 MG tablet Take 650 mg by mouth every 6 (six) hours as needed.    [provider]  albuterol (VENTOLIN HFA) 108 (90 Base) MCG/ACT inhaler Inhale 2 puffs into the lungs every 6 (six) hours as needed for wheezing or shortness of breath.    [provider]  amLODipine (NORVASC) 10 MG tablet Take 10 mg by mouth daily.    [provider]  Calcium Carb-Cholecalciferol (CALCIUM 600 + D PO) Take 1 tablet by mouth daily.  [provider]  celecoxib (CELEBREX) 200 MG capsule Take 1 capsule (200 mg total) by mouth 2 (two) times daily. 02/16/23   Dedra Skeens, PA-C  chlorthalidone (HYGROTON) 25 MG tablet Take 25 mg by mouth 2 (two) times a week. Patient takes only on Sunday and Wednesday    [provider]  cholecalciferol (VITAMIN D3) 25 MCG (1000 UNIT) tablet Take 1,000 Units by mouth daily.    [provider]  docusate sodium (COLACE) 100 MG capsule Take 100 mg by mouth 2 (two) times daily.    [provider]  doxycycline  (VIBRA-TABS) 100 MG tablet Take 100 mg by mouth 2 (two) times daily.    [provider]  Multiple Vitamins-Minerals (PRESERVISION AREDS 2 PO) Take 1 capsule by mouth 2 (two) times daily.    [provider]  Omega-3 Fatty Acids (FISH OIL) 300 MG CAPS Take 300 mg by mouth daily.    [provider]  omeprazole (PRILOSEC) 20 MG capsule Take 20 mg by mouth daily. 05/05/22   [provider]  oxyCODONE (OXY IR/ROXICODONE) 5 MG immediate release tablet Take 1 tablet (5 mg total) by mouth every 4 (four) hours as needed for moderate pain (pain score 4-6) (pain score 4-6). 02/16/23   Dedra Skeens, PA-C  relugolix (ORGOVYX) 120 MG tablet Take 1 tablet (120 mg total) by mouth daily. 02/20/23   Vanna Scotland, MD  traMADol (ULTRAM) 50 MG tablet Take 50 mg by mouth 2 (two) times daily as needed for moderate pain (pain score 4-6). 12/04/22   [provider]  traMADol (ULTRAM) 50 MG tablet Take 1-2 tablets (50-100 mg total) by mouth every 4 (four) hours as needed for moderate pain (pain score 4-6). 02/16/23   Dedra Skeens, PA-C  TRELEGY ELLIPTA 100-62.5-25 MCG/ACT AEPB Inhale 1 Inhalation into the lungs at bedtime. 05/30/22   [provider]  XARELTO 20 MG TABS tablet Take 1 tablet by mouth daily with supper. 04/06/22   [provider]    Allergies:   Allergies  Allergen Reactions   Lisinopril Cough   Penicillins Hives and Swelling    IgE = 17 ((WNL) on 02/08/2023    Social History:  reports that he has quit smoking. His smoking use included cigarettes. He has never used smokeless tobacco. He reports that he does not drink alcohol and does not use drugs.  Family History: Family History  Problem Relation Age of Onset   Bladder Cancer Neg Hx    Kidney cancer Neg Hx    Prostate cancer Neg Hx     Physical Exam: Vitals:   04/05/23 1548 04/05/23 1549 04/05/23 1832 04/05/23 1853  BP:  (!) 136/56  139/72  Pulse:  63  (!) 55  Resp:  (!) 21  (!) 22   Temp:   98.4 F (36.9 C)   TempSrc:   Oral   SpO2: 98% 97%  93%    General: A&O x 3, well developed and nourished, ill-appearing male Eyes: Pink conjunctiva, no scleral icterus ENT: Moist oral mucosa, neck supple, no thyromegaly Lungs: Coarse breath sounds, somewhat wheezy, no crackles, no use of accessory muscles Cardiovascular: RRR, no regurgitation, no JVD Abdomen: soft, positive BS, NTND, no organomegaly, not an acute abdomen GU: not examined Neuro: CN II - XII grossly intact, sensation intact Musculoskeletal: strength 5/5 all extremities, no clubbing, cyanosis or edema Skin: no rash, no subcutaneous crepitation, no decubitus Psych: appropriate patient   Labs on Admission:  Recent Labs  04/05/23 1418  NA 133*  K 3.6  CL 98  CO2 25  GLUCOSE 132*  BUN 28*  CREATININE 1.42*  CALCIUM 8.9    Recent Labs    04/05/23 1418  WBC 5.2  HGB 13.1  HCT 40.6  MCV 93.3  PLT 200    Micro Results: Recent Results (from the past 240 hours)  Resp panel by RT-PCR (RSV, Flu A&B, Covid) Anterior Nasal Swab     Status: Abnormal   Collection Time: 04/05/23  4:18 PM   Specimen: Anterior Nasal Swab  Result Value Ref Range Status   SARS Coronavirus 2 by RT PCR NEGATIVE NEGATIVE Final    Comment: (NOTE) SARS-CoV-2 target nucleic acids are NOT DETECTED.  The SARS-CoV-2 RNA is generally detectable in upper respiratory specimens during the acute phase of infection. The lowest concentration of SARS-CoV-2 viral copies this assay can detect is 138 copies/mL. A negative result does not preclude SARS-Cov-2 infection and should not be used as the sole basis for treatment or other patient management decisions. A negative result may occur with  improper specimen collection/handling, submission of specimen other than nasopharyngeal swab, presence of viral mutation(s) within the areas targeted by this assay, and inadequate number of viral copies(<138 copies/mL). A negative result must be  combined with clinical observations, patient history, and epidemiological information. The expected result is Negative.  Fact Sheet for Patients:  BloggerCourse.com  Fact Sheet for Healthcare Providers:  SeriousBroker.it  This test is no t yet approved or cleared by the [place] FDA and  has been authorized for detection and/or diagnosis of SARS-CoV-2 by FDA under an Emergency Use Authorization (EUA). This EUA will remain  in effect (meaning this test can be used) for the duration of the COVID-19 declaration under Section 564(b)(1) of the Act, 21 U.S.C.section 360bbb-3(b)(1), unless the authorization is terminated  or revoked sooner.       Influenza A by PCR NEGATIVE NEGATIVE Final   Influenza B by PCR NEGATIVE NEGATIVE Final    Comment: (NOTE) The Xpert Xpress SARS-CoV-2/FLU/RSV plus assay is intended as an aid in the diagnosis of influenza from Nasopharyngeal swab specimens and should not be used as a sole basis for treatment. Nasal washings and aspirates are unacceptable for Xpert Xpress SARS-CoV-2/FLU/RSV testing.  Fact Sheet for Patients: BloggerCourse.com  Fact Sheet for Healthcare Providers: SeriousBroker.it  This test is not yet approved or cleared by the [place] FDA and has been authorized for detection and/or diagnosis of SARS-CoV-2 by FDA under an Emergency Use Authorization (EUA). This EUA will remain in effect (meaning this test can be used) for the duration of the COVID-19 declaration under Section 564(b)(1) of the Act, 21 U.S.C. section 360bbb-3(b)(1), unless the authorization is terminated or revoked.     Resp Syncytial Virus by PCR POSITIVE (A) NEGATIVE Final    Comment: (NOTE) Fact Sheet for Patients: BloggerCourse.com  Fact Sheet for Healthcare Providers: SeriousBroker.it  This test is not  yet approved or cleared by the [place] FDA and has been authorized for detection and/or diagnosis of SARS-CoV-2 by FDA under an Emergency Use Authorization (EUA). This EUA will remain in effect (meaning this test can be used) for the duration of the COVID-19 declaration under Section 564(b)(1) of the Act, 21 U.S.C. section 360bbb-3(b)(1), unless the authorization is terminated or revoked.  Performed at [facility], [address] [place], [place] [address]      Radiological Exams on Admission: CT Angio Chest PE W and/or Wo Contrast  Result Date: 04/05/2023 CLINICAL DATA:  Worsening dyspnea on exertion, new hypoxia history of prostate cancer * Tracking Code: BO * EXAM: CT ANGIOGRAPHY CHEST WITH CONTRAST TECHNIQUE: Multidetector CT imaging of the chest was performed using the standard protocol during bolus administration of intravenous contrast. Multiplanar CT image reconstructions and MIPs were obtained to evaluate the vascular anatomy. RADIATION DOSE REDUCTION: This exam was performed according to the departmental dose-optimization program which includes automated exposure control, adjustment of the mA and/or kV according to patient size and/or use of iterative reconstruction technique. CONTRAST:  75mL OMNIPAQUE IOHEXOL 350 MG/ML SOLN COMPARISON:  CT chest, 08/28/2019 FINDINGS: Cardiovascular: Satisfactory opacification of the pulmonary arteries to the segmental level. No evidence of pulmonary embolism. Cardiomegaly. Left coronary artery calcifications. Enlargement of the main pulmonary artery measuring up to 3.8 cm in caliber. Small pericardial effusion. Mediastinum/Nodes: Numerous prominent mediastinal and hilar lymph nodes. Thyroid gland, trachea, and esophagus demonstrate no significant findings. Lungs/Pleura: Trace bilateral pleural effusions. Diffuse bilateral bronchial wall thickening and extensive heterogeneous and ground-glass airspace opacity throughout the lung bases  (series 5, image 106). Upper Abdomen: Continued enlargement of a left adrenal mass measuring 6.6 x 5.8 cm, previously 4.8 x 4.5 cm (series 4, image 154). Musculoskeletal: No chest wall abnormality. No acute osseous findings. Review of the MIP images confirms the above findings. IMPRESSION: 1. Negative examination for pulmonary embolism. 2. Diffuse bilateral bronchial wall thickening and extensive heterogeneous and ground-glass airspace opacity throughout the lung bases, consistent with infection or aspiration. 3. Trace bilateral pleural effusions. 4. Numerous prominent mediastinal and hilar lymph nodes, possibly reactive although concerning for metastases in the setting of patient's known malignancy. 5. Continued enlargement of a left adrenal mass measuring 6.6 x 5.8 cm, previously 4.8 x 4.5 cm. Behavior over time is concerning for a metastasis or primary adrenal malignancy. 6. Cardiomegaly and coronary artery disease. 7. Enlargement of the main pulmonary artery, as can be seen in pulmonary hypertension. Aortic Atherosclerosis (ICD10-I70.0). Electronically Signed   By: Jearld Lesch M.D.   On: 04/05/2023 18:48   DG Chest 2 View Result Date: 04/05/2023 CLINICAL DATA:  Shortness of breath EXAM: CHEST - 2 VIEW COMPARISON:  X-ray 10/28/2022. FINDINGS: Enlarged cardiopericardial silhouette. Small bilateral pleural effusions with some adjacent lung base opacities. Atelectasis versus infiltrate. Recommend follow-up. No pneumothorax. Dense rounded structure of the left midthorax could be a calcified lung nodule or a bone lesion. Unchanged from previous IMPRESSION: Enlarged heart with small pleural effusions and adjacent opacities. Atelectasis versus infiltrate. Recommend follow-up. Electronically Signed   By: Karen Kays M.D.   On: 04/05/2023 16:16    Assessment/Plan Present on Admission:  RSV infection //  acute exacerbation chronic obstructive pulmonary disease  Acute respiratory failure with  hypoxia -Nebulizers as needed.  Scheduled and as needed. -IV Solu-Medrol, then p.o. prednisone. -Mucinex scheduled, Tessalon Perles as needed -Oxygen to keep sats greater than 88% -Continue Trelegy   Question aspiration -N.p.o. speech consulted for swallow aspiration. -IV fluid hydration   Elevated troponin -Elevated but flat.  Patient without chest pain complaint.  EKG without ischemic changes.  EKG in atrial fibrillation. -Echo not ordered.  Will not change course of treatment   Paroxysmal atrial fibrillation (HCC) -Continue Xarelto.  Currently in atrial fibrillation  -Patient not on medication for rate control   Prostate cancer with mets. -PET scan 10/25 chest 2023 with mets. -Ongoing metastasis based on CTA chest   Benign essential HTN -Norvasc, HCTZ resumed   HFpEF -Patient euvolemic on examination.  CKD stage III (HCC) -Stable at baseline.  Avoid nephrotoxic medication  Verlyn Lambert 04/05/2023, 7:54 PM

## 2023-04-05 NOTE — ED Provider Notes (Signed)
[facility] Provider Note    Event Date/Time   First MD Initiated Contact with Patient 04/05/23 1521     (approximate)   History   Chest Pain   HPI COLTON PALMATEER is a 89 y.o. male with history of COPD, HTN, A-fib on Xarelto presenting today for chest pain.  Patient states for the past 5 days he has had worsening cough associated with chest pain and shortness of breath.  Has been taking cough medication without relief.  Occasionally productive with clear sputum.  Chest pain associated with the coughing.  Has also started developing diarrhea.  He denies nausea or vomiting but has not had any p.o. intake in the last 24 hours due to loss of appetite.  Otherwise denies abdominal pain or constipation.  No leg pain or leg swelling.  Recent history of hip surgery.  Denies any missed doses of his Xarelto.  Chart review: Patient recently seen by his pulmonologist and PCP.  States new diagnosis of COPD and just recently started on breathing treatments.  Patient is unaware that he has COPD on questioning.  Does have longtime smoking history although he stopped about 16 years ago.     Physical Exam   Triage Vital Signs: ED Triage Vitals  Encounter Vitals Group     BP 04/05/23 1413 125/65     Systolic BP Percentile --      Diastolic BP Percentile --      Pulse Rate 04/05/23 1413 65     Resp 04/05/23 1413 (!) 28     Temp 04/05/23 1413 99.6 F (37.6 C)     Temp src --      SpO2 04/05/23 1413 (!) 89 %     Weight --      Height --      Head Circumference --      Peak Flow --      Pain Score 04/05/23 1409 4     Pain Loc --      Pain Education --      Exclude from Growth Chart --     Most recent vital signs: Vitals:   04/05/23 1832 04/05/23 1853  BP:  139/72  Pulse:  (!) 55  Resp:  (!) 22  Temp: 98.4 F (36.9 C)   SpO2:  93%   Physical Exam: I have reviewed the vital signs and nursing notes. General: Awake, alert, no acute distress.  Nontoxic  appearing. Head:  Atraumatic, normocephalic.   ENT:  EOM intact, PERRL. Oral mucosa is pink and moist with no lesions. Neck: Neck is supple with full range of motion, No meningeal signs. Cardiovascular:  RRR, No murmurs. Peripheral pulses palpable and equal bilaterally. Respiratory:  Symmetrical chest wall expansion.  No rhonchi, rales, or wheezes.  Good air movement throughout.  No use of accessory muscles.   Musculoskeletal:  No cyanosis.  Mild 1+ pitting edema to bilateral lower extremities.  Moving extremities with full ROM Abdomen:  Soft, nontender, nondistended. Neuro:  GCS 15, moving all four extremities, interacting appropriately. Speech clear. Psych:  Calm, appropriate.   Skin:  Warm, dry, no rash.    ED Results / Procedures / Treatments   Labs (all labs ordered are listed, but only abnormal results are displayed) Labs Reviewed  RESP PANEL BY RT-PCR (RSV, FLU A&B, COVID)  [id number] - Abnormal; Notable for the following components:      Result Value   Resp Syncytial Virus by PCR POSITIVE (*)  All other components within normal limits  BASIC METABOLIC PANEL - Abnormal; Notable for the following components:   Sodium 133 (*)    Glucose, Bld 132 (*)    BUN 28 (*)    Creatinine, Ser 1.42 (*)    GFR, Estimated 47 (*)    All other components within normal limits  BRAIN NATRIURETIC PEPTIDE - Abnormal; Notable for the following components:   B Natriuretic Peptide 948.3 (*)    All other components within normal limits  TROPONIN I (HIGH SENSITIVITY) - Abnormal; Notable for the following components:   Troponin I (High Sensitivity) 133 (*)    All other components within normal limits  TROPONIN I (HIGH SENSITIVITY) - Abnormal; Notable for the following components:   Troponin I (High Sensitivity) 137 (*)    All other components within normal limits  CBC  LACTIC ACID, PLASMA  LACTIC ACID, PLASMA     EKG My EKG interpretation: Rate of 64, A-fib.  Normal axis.  QTc 474.  Right  bundle branch block.  No acute ST elevations or depressions   RADIOLOGY Independently interpreted chest x-ray and CTA chest with findings concerning for infectious process with groundglass abnormalities.  No evidence of PE.   PROCEDURES:  Critical Care performed: Yes, see critical care procedure note(s)  .Critical Care  Performed by: Janith Lima, MD Authorized by: Janith Lima, MD   Critical care provider statement:    Critical care time (minutes):  30   Critical care was necessary to treat or prevent imminent or life-threatening deterioration of the following conditions:  Respiratory failure   Critical care was time spent personally by me on the following activities:  Development of treatment plan with patient or surrogate, discussions with consultants, evaluation of patient's response to treatment, examination of patient, ordering and review of laboratory studies, ordering and review of radiographic studies, ordering and performing treatments and interventions, pulse oximetry, re-evaluation of patient's condition and review of old charts   I assumed direction of critical care for this patient from another provider in my specialty: no     Care discussed with: admitting provider      MEDICATIONS ORDERED IN ED: Medications  ipratropium-albuterol (DUONEB) 0.5-2.5 (3) MG/3ML nebulizer solution 3 mL (3 mLs Nebulization Given 04/05/23 1609)  iohexol (OMNIPAQUE) 350 MG/ML injection 75 mL (75 mLs Intravenous Contrast Given 04/05/23 1647)     IMPRESSION / MDM / ASSESSMENT AND PLAN / ED COURSE  I reviewed the triage vital signs and the nursing notes.                              Differential diagnosis includes, but is not limited to, COPD exacerbation, pneumonia, COVID/flu/RSV, CHF exacerbation, ACS, PE  Patient's presentation is most consistent with acute presentation with potential threat to life or bodily function.  Patient is a 89 year old male presenting today for chest pain,  shortness of breath, and cough.  Hypoxic on arrival with oxygenation in the upper 80s and placed on 2 L nasal cannula with improvement.  EKG without signs of ischemia.  First troponin elevated at 135 with repeat of 137 more suspecting of type II MI.  BNP is elevated at 948.  Also RSV positive.  Initial chest x-ray equivocal and follow-up CTA chest was ordered.  This showed no evidence of PE but showed multiple groundglass opacities which seem more consistent with RSV as the source of his shortness of breath but heart failure  could be contributing to this as well given he has 1+ pitting edema to his lower extremities.  Given new hypoxia and elevated troponins in the setting of acute infection, will admit to hospitalist for further care.  Patient was also given DuoNeb here in the ED with some improvement in his respiratory symptoms.  The patient is on the cardiac monitor to evaluate for evidence of arrhythmia and/or significant heart rate changes. Clinical Course as of 04/05/23 1911  Fri Apr 05, 2023  1627 92% on room air at rest [DW]  1639 B Natriuretic Peptide(!): 948.3 Elevated from prior levels concerning for CHF exacerbation [DW]  1712 Respiratory Syncytial Virus by PCR(!): POSITIVE [DW]    Clinical Course User Index [DW] Janith Lima, MD     FINAL CLINICAL IMPRESSION(S) / ED DIAGNOSES   Final diagnoses:  RSV bronchiolitis  Acute hypoxic respiratory failure (HCC)  Elevated troponin     Rx / DC Orders   ED Discharge Orders     None        Note:  This document was prepared using Dragon voice recognition software and may include unintentional dictation errors.   Janith Lima, MD 04/05/23 [phone]

## 2023-04-05 NOTE — ED Triage Notes (Signed)
First nurse note: Pt has CP and chest tightness since Sunday. Oxygen at [facility] 90%. Pt with hx of COPD, not on oxygen at home. Pt been unable to keep anything down.

## 2023-04-05 NOTE — ED Notes (Signed)
Pt to CT

## 2023-04-05 NOTE — ED Provider Triage Note (Signed)
Emergency Medicine Provider Triage Evaluation Note  WILBON HAMMOND , a 89 y.o. male  was evaluated in triage.  Pt complains of respiratory distress, daughter states he has not been feeling well for a week, some diarrhea, sent by [facility] due to low O2 level.  Review of Systems  Positive:  Negative:   Physical Exam  BP 125/65   Pulse 65   Temp 99.6 F (37.6 C)   Resp (!) 28   SpO2 (!) 89%  Gen:   Awake, no distress   Resp:  O2 89% room air, placed on 3 L MSK:   Moves extremities without difficulty  Other:    Medical Decision Making  Medically screening exam initiated at 2:17 PM.  Appropriate orders placed.  [name] was informed that the remainder of the evaluation will be completed by another provider, this initial triage assessment does not replace that evaluation, and the importance of remaining in the ED until their evaluation is complete.     Faythe Ghee, PA-C 04/05/23 [phone]

## 2023-04-06 DIAGNOSIS — J9601 Acute respiratory failure with hypoxia: Secondary | ICD-10-CM | POA: Diagnosis not present

## 2023-04-06 DIAGNOSIS — J121 Respiratory syncytial virus pneumonia: Secondary | ICD-10-CM | POA: Diagnosis not present

## 2023-04-06 LAB — CBC WITH DIFFERENTIAL/PLATELET
Abs Immature Granulocytes: 0.02 10*3/uL (ref 0.00–0.07)
Basophils Absolute: 0 10*3/uL (ref 0.0–0.1)
Basophils Relative: 0 %
Eosinophils Absolute: 0 10*3/uL (ref 0.0–0.5)
Eosinophils Relative: 0 %
HCT: 38.1 % — ABNORMAL LOW (ref 39.0–52.0)
Hemoglobin: 12.4 g/dL — ABNORMAL LOW (ref 13.0–17.0)
Immature Granulocytes: 0 %
Lymphocytes Relative: 22 %
Lymphs Abs: 1.1 10*3/uL (ref 0.7–4.0)
MCH: 29.9 pg (ref 26.0–34.0)
MCHC: 32.5 g/dL (ref 30.0–36.0)
MCV: 91.8 fL (ref 80.0–100.0)
Monocytes Absolute: 1 10*3/uL (ref 0.1–1.0)
Monocytes Relative: 20 %
Neutro Abs: 2.9 10*3/uL (ref 1.7–7.7)
Neutrophils Relative %: 58 %
Platelets: 184 10*3/uL (ref 150–400)
RBC: 4.15 MIL/uL — ABNORMAL LOW (ref 4.22–5.81)
RDW: 13 % (ref 11.5–15.5)
WBC: 5 10*3/uL (ref 4.0–10.5)
nRBC: 0 % (ref 0.0–0.2)

## 2023-04-06 LAB — BASIC METABOLIC PANEL
Anion gap: 10 (ref 5–15)
BUN: 30 mg/dL — ABNORMAL HIGH (ref 8–23)
CO2: 29 mmol/L (ref 22–32)
Calcium: 8.9 mg/dL (ref 8.9–10.3)
Chloride: 100 mmol/L (ref 98–111)
Creatinine, Ser: 1.55 mg/dL — ABNORMAL HIGH (ref 0.61–1.24)
GFR, Estimated: 42 mL/min — ABNORMAL LOW (ref >60)
Glucose, Bld: 100 mg/dL — ABNORMAL HIGH (ref 70–99)
Potassium: 3.1 mmol/L — ABNORMAL LOW (ref 3.5–5.1)
Sodium: 139 mmol/L (ref 135–145)

## 2023-04-06 LAB — MAGNESIUM: Magnesium: 2.2 mg/dL (ref 1.7–2.4)

## 2023-04-06 LAB — PROCALCITONIN: Procalcitonin: 0.12 ng/mL

## 2023-04-06 MED ORDER — PREDNISONE 20 MG PO TABS: 40.0000 mg | ORAL_TABLET | Freq: Every day | ORAL | Status: DC

## 2023-04-06 MED ORDER — METHYLPREDNISOLONE SODIUM SUCC 40 MG IJ SOLR
40.0000 mg | Freq: Two times a day (BID) | INTRAMUSCULAR | Status: DC
  Filled 2023-04-06: qty 1

## 2023-04-06 MED ORDER — SENNOSIDES-DOCUSATE SODIUM 8.6-50 MG PO TABS: 1.0000 | ORAL_TABLET | Freq: Every evening | ORAL | Status: DC | PRN

## 2023-04-06 MED ORDER — POTASSIUM CHLORIDE CRYS ER 20 MEQ PO TBCR
40.0000 meq | EXTENDED_RELEASE_TABLET | Freq: Once | ORAL | Status: AC
  Administered 2023-04-06: 40 meq via ORAL
  Filled 2023-04-06: qty 2

## 2023-04-06 MED ORDER — HEPARIN SODIUM (PORCINE) 5000 UNIT/ML IJ SOLN: 5000.0000 [IU] | Freq: Three times a day (TID) | INTRAMUSCULAR | Status: DC

## 2023-04-06 MED ORDER — RIVAROXABAN 15 MG PO TABS
15.0000 mg | ORAL_TABLET | Freq: Every day | ORAL | Status: DC
  Administered 2023-04-06 – 2023-04-11 (×6): 15 mg via ORAL
  Filled 2023-04-06 (×6): qty 1

## 2023-04-06 MED ORDER — ACETAMINOPHEN 650 MG RE SUPP: 650.0000 mg | Freq: Four times a day (QID) | RECTAL | Status: DC | PRN

## 2023-04-06 MED ORDER — METHYLPREDNISOLONE SODIUM SUCC 40 MG IJ SOLR
40.0000 mg | Freq: Two times a day (BID) | INTRAMUSCULAR | Status: DC
  Administered 2023-04-06 – 2023-04-07 (×3): 40 mg via INTRAVENOUS
  Filled 2023-04-06 (×2): qty 1

## 2023-04-06 MED ORDER — RIVAROXABAN 20 MG PO TABS
20.0000 mg | ORAL_TABLET | Freq: Every day | ORAL | Status: DC
  Filled 2023-04-06: qty 1

## 2023-04-06 MED ORDER — ACETAMINOPHEN 325 MG PO TABS
650.0000 mg | ORAL_TABLET | Freq: Four times a day (QID) | ORAL | Status: DC | PRN
  Administered 2023-04-07 – 2023-04-10 (×3): 650 mg via ORAL
  Filled 2023-04-06 (×3): qty 2

## 2023-04-06 NOTE — Progress Notes (Signed)
PROGRESS NOTE    Dennis Savage  ZOX:[id number] DOB: 1929/07/06 DOA: 04/05/2023 PCP: Gracelyn Nurse, MD  Assessment & Plan:   Principal Problem:   Acute respiratory failure with hypoxia [facility]) Active Problems:   Benign essential HTN   Paroxysmal atrial fibrillation (HCC)   Chronic obstructive pulmonary disease (COPD) (HCC)   RSV (respiratory syncytial virus pneumonia)  Assessment and Plan: RSV infection: continue on IV steroids, bronchodilators and encourage incentive spirometry. Did not get RSV vaccine. Continue on supplemental oxygen and wean as tolerated. Droplet precautions   Acute hypoxic respiratory failure: likely secondary to RSV. Continue w/ supportive care. Continue on supplemental oxygen and wean as tolerated.   Elevated troponin: likely secondary to demand ischemia. No CP    PAF: continue on xarelto. Not on any rate controlling meds as per med rec   Prostate cancer: with mets. Management per onco outpatient    HTN: continue on amlodipine, HCTZ   Chronic diastolic CHF: appears euvolemic. 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 Avoid nephrotoxic meds   Hypokalemia: potassium given   DVT prophylaxis: xarelto  Code Status: full  Family Communication: called pt's daughter, [name], no answer so I left a voicemail  Disposition Plan: depends on PT/OT recs   Level of care: Telemetry Medical  Status is: Inpatient Remains inpatient appropriate because: severity of illness    Consultants:    Procedures:   Antimicrobials:     Subjective: Pt c/o wheezing   Objective: Vitals:   04/06/23 0400 04/06/23 0430 04/06/23 0500 04/06/23 0530  BP: (!) 106/55 (!) 137/58 (!) 117/58 (!) 132/57  Pulse: (!) 53 (!) 53 (!) 50 (!) 57  Resp:    19  Temp:    99 F (37.2 C)  TempSrc:      SpO2: 93% 98% 94% 95%   No intake or output data in the 24 hours ending 04/06/23 0851 There were no vitals filed for this visit.  Examination:  General exam: Appears calm  but  uncomfortable  Respiratory system: course breath sounds b/l. Wheezing b/l  Cardiovascular system: irregularly irregular. No rubs, gallops or clicks.  Gastrointestinal system: Abdomen is nondistended, soft and nontender.  Normal bowel sounds heard. Central nervous system: Alert and oriented. Moves all extremities  Psychiatry: Judgement and insight appear normal. Flat mood and affect    Data Reviewed: I have personally reviewed following labs and imaging studies  CBC: Recent Labs  Lab 04/05/23 1418 04/06/23 0535  WBC 5.2 5.0  NEUTROABS  --  2.9  HGB 13.1 12.4*  HCT 40.6 38.1*  MCV 93.3 91.8  PLT 200 184   Basic Metabolic Panel: Recent Labs  Lab 04/05/23 1418 04/06/23 0535  NA 133* 139  K 3.6 3.1*  CL 98 100  CO2 25 29  GLUCOSE 132* 100*  BUN 28* 30*  CREATININE 1.42* 1.55*  CALCIUM 8.9 8.9  MG  --  2.2   GFR: CrCl cannot be calculated (Unknown ideal weight.). Liver Function Tests: No results for input(s): "AST", "ALT", "ALKPHOS", "BILITOT", "PROT", "ALBUMIN" in the last 168 hours. No results for input(s): "LIPASE", "AMYLASE" in the last 168 hours. No results for input(s): "AMMONIA" in the last 168 hours. Coagulation Profile: No results for input(s): "INR", "PROTIME" in the last 168 hours. Cardiac Enzymes: No results for input(s): "CKTOTAL", "CKMB", "CKMBINDEX", "TROPONINI" in the last 168 hours. BNP (last 3 results) No results for input(s): "PROBNP" in the last 8760 hours. HbA1C: No results for input(s): "HGBA1C" in the last  72 hours. CBG: No results for input(s): "GLUCAP" in the last 168 hours. Lipid Profile: No results for input(s): "CHOL", "HDL", "LDLCALC", "TRIG", "CHOLHDL", "LDLDIRECT" in the last 72 hours. Thyroid Function Tests: No results for input(s): "TSH", "T4TOTAL", "FREET4", "T3FREE", "THYROIDAB" in the last 72 hours. Anemia Panel: No results for input(s): "VITAMINB12", "FOLATE", "FERRITIN", "TIBC", "IRON", "RETICCTPCT" in the last 72  hours. Sepsis Labs: Recent Labs  Lab 04/05/23 1418 04/05/23 1618  LATICACIDVEN 1.2 1.2    Recent Results (from the past 240 hours)  Resp panel by RT-PCR (RSV, Flu A&B, Covid) Anterior Nasal Swab     Status: Abnormal   Collection Time: 04/05/23  4:18 PM   Specimen: Anterior Nasal Swab  Result Value Ref Range Status   SARS Coronavirus 2 by RT PCR NEGATIVE NEGATIVE Final    Comment: (NOTE) SARS-CoV-2 target nucleic acids are NOT DETECTED.  The SARS-CoV-2 RNA is generally detectable in upper respiratory specimens during the acute phase of infection. The lowest concentration of SARS-CoV-2 viral copies this assay can detect is 138 copies/mL. A negative result does not preclude SARS-Cov-2 infection and should not be used as the sole basis for treatment or other patient management decisions. A negative result may occur with  improper specimen collection/handling, submission of specimen other than nasopharyngeal swab, presence of viral mutation(s) within the areas targeted by this assay, and inadequate number of viral copies(<138 copies/mL). A negative result must be combined with clinical observations, patient history, and epidemiological information. The expected result is Negative.  Fact Sheet for Patients:  BloggerCourse.com  Fact Sheet for Healthcare Providers:  SeriousBroker.it  This test is no t yet approved or cleared by the [place] FDA and  has been authorized for detection and/or diagnosis of SARS-CoV-2 by FDA under an Emergency Use Authorization (EUA). This EUA will remain  in effect (meaning this test can be used) for the duration of the COVID-19 declaration under Section 564(b)(1) of the Act, 21 U.S.C.section 360bbb-3(b)(1), unless the authorization is terminated  or revoked sooner.       Influenza A by PCR NEGATIVE NEGATIVE Final   Influenza B by PCR NEGATIVE NEGATIVE Final    Comment: (NOTE) The Xpert  Xpress SARS-CoV-2/FLU/RSV plus assay is intended as an aid in the diagnosis of influenza from Nasopharyngeal swab specimens and should not be used as a sole basis for treatment. Nasal washings and aspirates are unacceptable for Xpert Xpress SARS-CoV-2/FLU/RSV testing.  Fact Sheet for Patients: BloggerCourse.com  Fact Sheet for Healthcare Providers: SeriousBroker.it  This test is not yet approved or cleared by the [place] FDA and has been authorized for detection and/or diagnosis of SARS-CoV-2 by FDA under an Emergency Use Authorization (EUA). This EUA will remain in effect (meaning this test can be used) for the duration of the COVID-19 declaration under Section 564(b)(1) of the Act, 21 U.S.C. section 360bbb-3(b)(1), unless the authorization is terminated or revoked.     Resp Syncytial Virus by PCR POSITIVE (A) NEGATIVE Final    Comment: (NOTE) Fact Sheet for Patients: BloggerCourse.com  Fact Sheet for Healthcare Providers: SeriousBroker.it  This test is not yet approved or cleared by the [place] FDA and has been authorized for detection and/or diagnosis of SARS-CoV-2 by FDA under an Emergency Use Authorization (EUA). This EUA will remain in effect (meaning this test can be used) for the duration of the COVID-19 declaration under Section 564(b)(1) of the Act, 21 U.S.C. section 360bbb-3(b)(1), unless the authorization is terminated or revoked.  Performed at [facility]  Lab, [address] [place], [place] [address]          Radiology Studies: CT Angio Chest PE W and/or Wo Contrast Result Date: 04/05/2023 CLINICAL DATA:  Worsening dyspnea on exertion, new hypoxia history of prostate cancer * Tracking Code: BO * EXAM: CT ANGIOGRAPHY CHEST WITH CONTRAST TECHNIQUE: Multidetector CT imaging of the chest was performed using the standard protocol during bolus  administration of intravenous contrast. Multiplanar CT image reconstructions and MIPs were obtained to evaluate the vascular anatomy. RADIATION DOSE REDUCTION: This exam was performed according to the departmental dose-optimization program which includes automated exposure control, adjustment of the mA and/or kV according to patient size and/or use of iterative reconstruction technique. CONTRAST:  75mL OMNIPAQUE IOHEXOL 350 MG/ML SOLN COMPARISON:  CT chest, 08/28/2019 FINDINGS: Cardiovascular: Satisfactory opacification of the pulmonary arteries to the segmental level. No evidence of pulmonary embolism. Cardiomegaly. Left coronary artery calcifications. Enlargement of the main pulmonary artery measuring up to 3.8 cm in caliber. Small pericardial effusion. Mediastinum/Nodes: Numerous prominent mediastinal and hilar lymph nodes. Thyroid gland, trachea, and esophagus demonstrate no significant findings. Lungs/Pleura: Trace bilateral pleural effusions. Diffuse bilateral bronchial wall thickening and extensive heterogeneous and ground-glass airspace opacity throughout the lung bases (series 5, image 106). Upper Abdomen: Continued enlargement of a left adrenal mass measuring 6.6 x 5.8 cm, previously 4.8 x 4.5 cm (series 4, image 154). Musculoskeletal: No chest wall abnormality. No acute osseous findings. Review of the MIP images confirms the above findings. IMPRESSION: 1. Negative examination for pulmonary embolism. 2. Diffuse bilateral bronchial wall thickening and extensive heterogeneous and ground-glass airspace opacity throughout the lung bases, consistent with infection or aspiration. 3. Trace bilateral pleural effusions. 4. Numerous prominent mediastinal and hilar lymph nodes, possibly reactive although concerning for metastases in the setting of patient's known malignancy. 5. Continued enlargement of a left adrenal mass measuring 6.6 x 5.8 cm, previously 4.8 x 4.5 cm. Behavior over time is concerning for a  metastasis or primary adrenal malignancy. 6. Cardiomegaly and coronary artery disease. 7. Enlargement of the main pulmonary artery, as can be seen in pulmonary hypertension. Aortic Atherosclerosis (ICD10-I70.0). Electronically Signed   By: Jearld Lesch M.D.   On: 04/05/2023 18:48   DG Chest 2 View Result Date: 04/05/2023 CLINICAL DATA:  Shortness of breath EXAM: CHEST - 2 VIEW COMPARISON:  X-ray 10/28/2022. FINDINGS: Enlarged cardiopericardial silhouette. Small bilateral pleural effusions with some adjacent lung base opacities. Atelectasis versus infiltrate. Recommend follow-up. No pneumothorax. Dense rounded structure of the left midthorax could be a calcified lung nodule or a bone lesion. Unchanged from previous IMPRESSION: Enlarged heart with small pleural effusions and adjacent opacities. Atelectasis versus infiltrate. Recommend follow-up. Electronically Signed   By: Karen Kays M.D.   On: 04/05/2023 16:16        Scheduled Meds:  fluticasone furoate-vilanterol  1 puff Inhalation Daily   And   umeclidinium bromide  1 puff Inhalation Daily   guaiFENesin  600 mg Oral BID   ipratropium-albuterol  3 mL Nebulization Q6H   melatonin  5 mg Oral QHS   methylPREDNISolone (SOLU-MEDROL) injection  40 mg Intravenous Q12H   Followed by   Melene Muller ON 04/07/2023] predniSONE  40 mg Oral Q breakfast   rivaroxaban  20 mg Oral Q supper   Continuous Infusions:   LOS: 1 day      Charise Killian, MD [facility] Pager [phone]  If 7PM-7AM, please contact night-coverage [email or web address] 04/06/2023, 8:51 AM

## 2023-04-06 NOTE — ED Notes (Signed)
Pt requested walker to go to the toilet in the room. Attempted to get patient up and oxygen dropped to 83%. Suggested pt sit back down and use a bedpan to be safe

## 2023-04-07 DIAGNOSIS — J121 Respiratory syncytial virus pneumonia: Secondary | ICD-10-CM | POA: Diagnosis not present

## 2023-04-07 DIAGNOSIS — J9601 Acute respiratory failure with hypoxia: Secondary | ICD-10-CM | POA: Diagnosis not present

## 2023-04-07 LAB — CBC
HCT: 37.9 % — ABNORMAL LOW (ref 39.0–52.0)
Hemoglobin: 12.6 g/dL — ABNORMAL LOW (ref 13.0–17.0)
MCH: 29.9 pg (ref 26.0–34.0)
MCHC: 33.2 g/dL (ref 30.0–36.0)
MCV: 89.8 fL (ref 80.0–100.0)
Platelets: 194 10*3/uL (ref 150–400)
RBC: 4.22 MIL/uL (ref 4.22–5.81)
RDW: 12.9 % (ref 11.5–15.5)
WBC: 6.6 10*3/uL (ref 4.0–10.5)
nRBC: 0 % (ref 0.0–0.2)

## 2023-04-07 LAB — BASIC METABOLIC PANEL
Anion gap: 9 (ref 5–15)
BUN: 38 mg/dL — ABNORMAL HIGH (ref 8–23)
CO2: 27 mmol/L (ref 22–32)
Calcium: 8.6 mg/dL — ABNORMAL LOW (ref 8.9–10.3)
Chloride: 98 mmol/L (ref 98–111)
Creatinine, Ser: 1.49 mg/dL — ABNORMAL HIGH (ref 0.61–1.24)
GFR, Estimated: 44 mL/min — ABNORMAL LOW (ref >60)
Glucose, Bld: 147 mg/dL — ABNORMAL HIGH (ref 70–99)
Potassium: 4.2 mmol/L (ref 3.5–5.1)
Sodium: 134 mmol/L — ABNORMAL LOW (ref 135–145)

## 2023-04-07 MED ORDER — DOCUSATE SODIUM 100 MG PO CAPS
200.0000 mg | ORAL_CAPSULE | Freq: Two times a day (BID) | ORAL | Status: DC
Start: 2023-04-07 — End: 2023-04-11
  Administered 2023-04-07 – 2023-04-10 (×6): 200 mg via ORAL
  Filled 2023-04-07 (×7): qty 2

## 2023-04-07 MED ORDER — METHYLPREDNISOLONE SODIUM SUCC 40 MG IJ SOLR
40.0000 mg | Freq: Three times a day (TID) | INTRAMUSCULAR | Status: DC
  Administered 2023-04-07 – 2023-04-09 (×6): 40 mg via INTRAVENOUS
  Filled 2023-04-07 (×6): qty 1

## 2023-04-07 NOTE — Plan of Care (Signed)
  Problem: Clinical Measurements: Goal: Respiratory complications will improve Outcome: Not Progressing Goal: Cardiovascular complication will be avoided Outcome: Progressing   Problem: Activity: Goal: Risk for activity intolerance will decrease Outcome: Not Progressing

## 2023-04-07 NOTE — NC FL2 (Signed)
[place] MEDICAID FL2 LEVEL OF CARE FORM     IDENTIFICATION  Patient Name: Dennis Savage Birthdate: 1930/04/01 Sex: male Admission Date (Current Location): 04/05/2023  [facility] and [place] Number:  Chiropodist and Address:  [facility], [address] [place], [place] [address]      Provider Number: [id number]  Attending Physician Name and Address:  Charise Killian, MD  Relative Name and Phone Number:  Mims, [place] (Daughter)  [phone] (Mobile)    Current Level of Care: Hospital Recommended Level of Care: Skilled Nursing Facility Prior Approval Number:    Date Approved/Denied:   PASRR Number: [id number]  Discharge Plan:      Current Diagnoses: Patient Active Problem List   Diagnosis Date Noted   RSV (respiratory syncytial virus pneumonia) 04/05/2023   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   Exposure to potentially hazardous substance 01/13/2023   Sepsis due to pneumonia (HCC) 10/28/2022   Essential hypertension 10/28/2022   Paroxysmal atrial fibrillation (HCC) 10/28/2022   GERD without esophagitis 10/28/2022   Chronic diastolic CHF (congestive heart failure) (HCC) 10/28/2022   Chronic obstructive pulmonary disease (COPD) (HCC) 10/28/2022   Acute respiratory failure with hypoxia (HCC) 10/28/2022   Weakness 10/28/2022   Obesity (BMI 30-39.9) 06/21/2021   Pleural effusion on left 06/19/2021   CAP (community acquired pneumonia) 06/18/2021   Acute on chronic diastolic CHF (congestive heart failure) (HCC) 06/18/2021   Adrenal mass, left (HCC) 06/18/2021   Elevated troponin 06/18/2021   CHF (congestive heart failure) (HCC) 06/17/2021   Acute kidney injury superimposed on CKD (HCC) 06/17/2021   Acute CHF (congestive heart failure) (HCC) 06/17/2021   S/P total hip arthroplasty 10/12/2015   Degenerative arthritis of hip 09/12/2015   Atrial fibrillation (HCC) 07/13/2015   Apnea, sleep 07/13/2015   Acute diarrhea  02/25/2015   Dizziness 02/25/2015   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, 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   Retinal hemorrhage 03/16/2014   Interstitial lung disease (HCC) 10/13/2013   After cataract not obscuring vision 12/21/2011   History of surgical procedure 12/21/2011   Constipation 06/15/2011   Encounter for general adult medical examination without abnormal findings 06/15/2011   Arthralgia of multiple joints 06/15/2011   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   Artificial lens present 12/08/2010   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   Benign essential HTN 11/03/2010   COPD with exacerbation (HCC) 11/03/2010   Benign hypertension 11/03/2010   Low back pain 11/03/2010   Peripheral vascular disease (HCC) 11/03/2010   Current tobacco use 11/03/2010    Orientation RESPIRATION BLADDER Height & Weight     Self, Time, Situation, Place  O2 Continent Weight: 185 lb 10 oz (84.2 kg) Height:  5\' 9"  (175.3 cm)  BEHAVIORAL SYMPTOMS/MOOD NEUROLOGICAL BOWEL NUTRITION STATUS        Diet (heart)  AMBULATORY STATUS COMMUNICATION OF NEEDS Skin   Limited Assist Verbally Bruising                       Personal Care Assistance Level of Assistance  Bathing, Feeding, Dressing Bathing Assistance: Limited assistance Feeding assistance: Independent Dressing Assistance: Limited assistance     Functional Limitations Info             SPECIAL CARE FACTORS FREQUENCY  PT (By licensed PT), OT (By licensed OT)     PT Frequency: 5 times per week OT Frequency: 5 times per week            Contractures  Additional Factors Info  Isolation Precautions, Code Status, Allergies Code Status Info: full Allergies Info: Lisinopril; Penicillins     Isolation Precautions Info: droplet/contact     Current Medications (04/07/2023):  This is the current hospital active medication list Current Facility-Administered Medications  Medication Dose Route Frequency Provider Last Rate Last Admin    acetaminophen (TYLENOL) tablet 650 mg  650 mg Oral Q6H PRN Gery Pray, MD   650 mg at 04/07/23 0759   Or   acetaminophen (TYLENOL) suppository 650 mg  650 mg Rectal Q6H PRN Crosley, Debby, MD       albuterol (PROVENTIL) (2.5 MG/3ML) 0.083% nebulizer solution 2.5 mg  2.5 mg Nebulization Q2H PRN Crosley, Debby, MD   2.5 mg at 04/07/23 1021   benzonatate (TESSALON) capsule 100 mg  100 mg Oral TID PRN Gery Pray, MD   100 mg at 04/07/23 0800   docusate sodium (COLACE) capsule 200 mg  200 mg Oral BID Charise Killian, MD       fluticasone furoate-vilanterol (BREO ELLIPTA) 100-25 MCG/ACT 1 puff  1 puff Inhalation Daily Crosley, Debby, MD   1 puff at 04/07/23 0805   And   umeclidinium bromide (INCRUSE ELLIPTA) 62.5 MCG/ACT 1 puff  1 puff Inhalation Daily Crosley, Debby, MD   1 puff at 04/07/23 0806   guaiFENesin (MUCINEX) 12 hr tablet 600 mg  600 mg Oral BID Crosley, Debby, MD   600 mg at 04/07/23 0759   melatonin tablet 5 mg  5 mg Oral QHS Crosley, Debby, MD   5 mg at 04/06/23 2110   methylPREDNISolone sodium succinate (SOLU-MEDROL) 40 mg/mL injection 40 mg  40 mg Intravenous Q8H Charise Killian, MD       Rivaroxaban Carlena Hurl) tablet 15 mg  15 mg Oral Q supper Charise Killian, MD   15 mg at 04/06/23 1653   senna-docusate (Senokot-S) tablet 1 tablet  1 tablet Oral QHS PRN Gery Pray, MD         Discharge Medications: Please see discharge summary for a list of discharge medications.  Relevant Imaging Results:  Relevant Lab Results:   Additional Information SS #: [id number]  Filomena Pokorney E Cody Albus, LCSW

## 2023-04-07 NOTE — Progress Notes (Signed)
PROGRESS NOTE   HPI was taken from Dr. Joneen Roach: This is a 89 year old male with past medical history significant for HTN, atrial fibrillation, HTN, COPD, peripheral vascular disease, CHF, OSA.  Patient recently admitted 11/1-11/6 for left hip replacement done.  Patient discharged to skilled nursing facility.  Per patient he has been [place] well postoperatively.  He states he has had no pain and doing well with PT.  On Sunday he developed cough, wheezing, shortness of breath.  He states is a bit lightheaded.  He denies chest pains.  He denies fever but endorses occasional chills.  He reports intermittent diarrhea, including today.  He denies nausea, or vomiting.  He came to the ER because of his respiratory issues.   In the ER presenting vital stable 125/89, 65, 28, Tmax 99.6.  Satting 89% on room air.  Placed on 2 L.  RSV positive, BNP 948 baseline 438 on 10/28/2022.  Troponin 133 => 137, lactic acid 1.2.   CT chest showed diffuse bilateral bronchial wall thickening and extensive heterogeneous and ground-glass airspace opacity throughout the lung bases, consistent with infection or aspiration.    Dennis Savage  GNF:[id number] DOB: November 23, 1929 DOA: 04/05/2023 PCP: Gracelyn Nurse, MD  Assessment & Plan:   Principal Problem:   Acute respiratory failure with hypoxia [facility] Active Problems:   Benign essential HTN   Paroxysmal atrial fibrillation (HCC)   Chronic obstructive pulmonary disease (COPD) (HCC)   RSV (respiratory syncytial virus pneumonia)  Assessment and Plan: RSV infection: increased IV steroids today & continue on bronchodilators & encourage incentive spirometry. Did not get RSV vaccine. Continue on supplemental oxygen wean as tolerated. Droplet precautions   Acute hypoxic respiratory failure: likely secondary to RSV. Continue w/ supportive care. Continue on supplemental oxygen and wean as tolerated    Elevated troponin: likely secondary to demand ischemia. No CP    PAF:  continue on xarelto. Not on any rate controlling meds as per med rec   Prostate cancer: with mets. Management per onco outpatient    HTN: holding amlodipine, HCTZ   Chronic diastolic CHF: appears euvolemic. Monitor I/Os   CKDIIIa: Cr is trending down from day prior. Avoid nephrotoxic meds   Hypokalemia: WNL today    DVT prophylaxis: xarelto  Code Status: full  Family Communication: called pt's daughter, [name], no answer so I left a voicemail  Disposition Plan: PT/OT recs SNF  Level of care: Telemetry Medical  Status is: Inpatient Remains inpatient appropriate because: severity of illness    Consultants:    Procedures:   Antimicrobials:     Subjective: Pt c/o shortness of breath  Objective: Vitals:   04/06/23 1830 04/06/23 1900 04/06/23 2022 04/07/23 0345  BP: 116/72 118/65 138/70 (!) 142/67  Pulse: (!) 55 (!) 56 (!) 53 (!) 52  Resp: (!) 26 19 20 19   Temp:   98 F (36.7 C) 97.9 F (36.6 C)  TempSrc:   Oral Oral  SpO2: 97% 98% 100% 95%  Weight:   84.2 kg   Height:   5\' 9"  (1.753 m)     Intake/Output Summary (Last 24 hours) at 04/07/2023 0849 Last data filed at 04/07/2023 8469 Gross per 24 hour  Intake 240 ml  Output 400 ml  Net -160 ml   Filed Weights   04/06/23 2022  Weight: 84.2 kg    Examination:  General exam: appears uncomfortable  Respiratory system: course breath sounds b/l. Wheezing b/l  Cardiovascular system: irregularly irregular  Gastrointestinal system: Abd is soft, NT,  obese & hypoactive bowel sounds  Central nervous system: alert & oriented. Moves all extremities  Psychiatry: Judgement and insight appears normal. Flat mood and affect    Data Reviewed: I have personally reviewed following labs and imaging studies  CBC: Recent Labs  Lab 04/05/23 1418 04/06/23 0535 04/07/23 0310  WBC 5.2 5.0 6.6  NEUTROABS  --  2.9  --   HGB 13.1 12.4* 12.6*  HCT 40.6 38.1* 37.9*  MCV 93.3 91.8 89.8  PLT 200 184 194   Basic Metabolic  Panel: Recent Labs  Lab 04/05/23 1418 04/06/23 0535 04/07/23 0310  NA 133* 139 134*  K 3.6 3.1* 4.2  CL 98 100 98  CO2 25 29 27   GLUCOSE 132* 100* 147*  BUN 28* 30* 38*  CREATININE 1.42* 1.55* 1.49*  CALCIUM 8.9 8.9 8.6*  MG  --  2.2  --    GFR: Estimated Creatinine Clearance: 32.3 mL/min (A) (by C-G formula based on SCr of 1.49 mg/dL (H)). Liver Function Tests: No results for input(s): "AST", "ALT", "ALKPHOS", "BILITOT", "PROT", "ALBUMIN" in the last 168 hours. No results for input(s): "LIPASE", "AMYLASE" in the last 168 hours. No results for input(s): "AMMONIA" in the last 168 hours. Coagulation Profile: No results for input(s): "INR", "PROTIME" in the last 168 hours. Cardiac Enzymes: No results for input(s): "CKTOTAL", "CKMB", "CKMBINDEX", "TROPONINI" in the last 168 hours. BNP (last 3 results) No results for input(s): "PROBNP" in the last 8760 hours. HbA1C: No results for input(s): "HGBA1C" in the last 72 hours. CBG: No results for input(s): "GLUCAP" in the last 168 hours. Lipid Profile: No results for input(s): "CHOL", "HDL", "LDLCALC", "TRIG", "CHOLHDL", "LDLDIRECT" in the last 72 hours. Thyroid Function Tests: No results for input(s): "TSH", "T4TOTAL", "FREET4", "T3FREE", "THYROIDAB" in the last 72 hours. Anemia Panel: No results for input(s): "VITAMINB12", "FOLATE", "FERRITIN", "TIBC", "IRON", "RETICCTPCT" in the last 72 hours. Sepsis Labs: Recent Labs  Lab 04/05/23 1418 04/05/23 1618 04/06/23 0535  PROCALCITON  --   --  0.12  LATICACIDVEN 1.2 1.2  --     Recent Results (from the past 240 hours)  Resp panel by RT-PCR (RSV, Flu A&B, Covid) Anterior Nasal Swab     Status: Abnormal   Collection Time: 04/05/23  4:18 PM   Specimen: Anterior Nasal Swab  Result Value Ref Range Status   SARS Coronavirus 2 by RT PCR NEGATIVE NEGATIVE Final    Comment: (NOTE) SARS-CoV-2 target nucleic acids are NOT DETECTED.  The SARS-CoV-2 RNA is generally detectable in upper  respiratory specimens during the acute phase of infection. The lowest concentration of SARS-CoV-2 viral copies this assay can detect is 138 copies/mL. A negative result does not preclude SARS-Cov-2 infection and should not be used as the sole basis for treatment or other patient management decisions. A negative result may occur with  improper specimen collection/handling, submission of specimen other than nasopharyngeal swab, presence of viral mutation(s) within the areas targeted by this assay, and inadequate number of viral copies(<138 copies/mL). A negative result must be combined with clinical observations, patient history, and epidemiological information. The expected result is Negative.  Fact Sheet for Patients:  BloggerCourse.com  Fact Sheet for Healthcare Providers:  SeriousBroker.it  This test is no t yet approved or cleared by the [place] FDA and  has been authorized for detection and/or diagnosis of SARS-CoV-2 by FDA under an Emergency Use Authorization (EUA). This EUA will remain  in effect (meaning this test can be used) for the duration of the  COVID-19 declaration under Section 564(b)(1) of the Act, 21 U.S.C.section 360bbb-3(b)(1), unless the authorization is terminated  or revoked sooner.       Influenza A by PCR NEGATIVE NEGATIVE Final   Influenza B by PCR NEGATIVE NEGATIVE Final    Comment: (NOTE) The Xpert Xpress SARS-CoV-2/FLU/RSV plus assay is intended as an aid in the diagnosis of influenza from Nasopharyngeal swab specimens and should not be used as a sole basis for treatment. Nasal washings and aspirates are unacceptable for Xpert Xpress SARS-CoV-2/FLU/RSV testing.  Fact Sheet for Patients: BloggerCourse.com  Fact Sheet for Healthcare Providers: SeriousBroker.it  This test is not yet approved or cleared by the [place] FDA and has been  authorized for detection and/or diagnosis of SARS-CoV-2 by FDA under an Emergency Use Authorization (EUA). This EUA will remain in effect (meaning this test can be used) for the duration of the COVID-19 declaration under Section 564(b)(1) of the Act, 21 U.S.C. section 360bbb-3(b)(1), unless the authorization is terminated or revoked.     Resp Syncytial Virus by PCR POSITIVE (A) NEGATIVE Final    Comment: (NOTE) Fact Sheet for Patients: BloggerCourse.com  Fact Sheet for Healthcare Providers: SeriousBroker.it  This test is not yet approved or cleared by the [place] FDA and has been authorized for detection and/or diagnosis of SARS-CoV-2 by FDA under an Emergency Use Authorization (EUA). This EUA will remain in effect (meaning this test can be used) for the duration of the COVID-19 declaration under Section 564(b)(1) of the Act, 21 U.S.C. section 360bbb-3(b)(1), unless the authorization is terminated or revoked.  Performed at [facility], [address] [place], [place] [address]          Radiology Studies: CT Angio Chest PE W and/or Wo Contrast Result Date: 04/05/2023 CLINICAL DATA:  Worsening dyspnea on exertion, new hypoxia history of prostate cancer * Tracking Code: BO * EXAM: CT ANGIOGRAPHY CHEST WITH CONTRAST TECHNIQUE: Multidetector CT imaging of the chest was performed using the standard protocol during bolus administration of intravenous contrast. Multiplanar CT image reconstructions and MIPs were obtained to evaluate the vascular anatomy. RADIATION DOSE REDUCTION: This exam was performed according to the departmental dose-optimization program which includes automated exposure control, adjustment of the mA and/or kV according to patient size and/or use of iterative reconstruction technique. CONTRAST:  75mL OMNIPAQUE IOHEXOL 350 MG/ML SOLN COMPARISON:  CT chest, 08/28/2019 FINDINGS: Cardiovascular:  Satisfactory opacification of the pulmonary arteries to the segmental level. No evidence of pulmonary embolism. Cardiomegaly. Left coronary artery calcifications. Enlargement of the main pulmonary artery measuring up to 3.8 cm in caliber. Small pericardial effusion. Mediastinum/Nodes: Numerous prominent mediastinal and hilar lymph nodes. Thyroid gland, trachea, and esophagus demonstrate no significant findings. Lungs/Pleura: Trace bilateral pleural effusions. Diffuse bilateral bronchial wall thickening and extensive heterogeneous and ground-glass airspace opacity throughout the lung bases (series 5, image 106). Upper Abdomen: Continued enlargement of a left adrenal mass measuring 6.6 x 5.8 cm, previously 4.8 x 4.5 cm (series 4, image 154). Musculoskeletal: No chest wall abnormality. No acute osseous findings. Review of the MIP images confirms the above findings. IMPRESSION: 1. Negative examination for pulmonary embolism. 2. Diffuse bilateral bronchial wall thickening and extensive heterogeneous and ground-glass airspace opacity throughout the lung bases, consistent with infection or aspiration. 3. Trace bilateral pleural effusions. 4. Numerous prominent mediastinal and hilar lymph nodes, possibly reactive although concerning for metastases in the setting of patient's known malignancy. 5. Continued enlargement of a left adrenal mass measuring 6.6 x 5.8 cm, previously 4.8 x  4.5 cm. Behavior over time is concerning for a metastasis or primary adrenal malignancy. 6. Cardiomegaly and coronary artery disease. 7. Enlargement of the main pulmonary artery, as can be seen in pulmonary hypertension. Aortic Atherosclerosis (ICD10-I70.0). Electronically Signed   By: Jearld Lesch M.D.   On: 04/05/2023 18:48   DG Chest 2 View Result Date: 04/05/2023 CLINICAL DATA:  Shortness of breath EXAM: CHEST - 2 VIEW COMPARISON:  X-ray 10/28/2022. FINDINGS: Enlarged cardiopericardial silhouette. Small bilateral pleural effusions with  some adjacent lung base opacities. Atelectasis versus infiltrate. Recommend follow-up. No pneumothorax. Dense rounded structure of the left midthorax could be a calcified lung nodule or a bone lesion. Unchanged from previous IMPRESSION: Enlarged heart with small pleural effusions and adjacent opacities. Atelectasis versus infiltrate. Recommend follow-up. Electronically Signed   By: Karen Kays M.D.   On: 04/05/2023 16:16        Scheduled Meds:  fluticasone furoate-vilanterol  1 puff Inhalation Daily   And   umeclidinium bromide  1 puff Inhalation Daily   guaiFENesin  600 mg Oral BID   melatonin  5 mg Oral QHS   methylPREDNISolone (SOLU-MEDROL) injection  40 mg Intravenous Q12H   rivaroxaban  15 mg Oral Q supper   Continuous Infusions:   LOS: 2 days      Charise Killian, MD [facility] Pager [phone]  If 7PM-7AM, please contact night-coverage [email or web address] 04/07/2023, 8:49 AM

## 2023-04-07 NOTE — Evaluation (Signed)
Occupational Therapy Evaluation Patient Details Name: Dennis Savage MRN: [id number] DOB: 1930/03/14 Today's Date: 04/07/2023   History of Present Illness This is a 89 year old male with past medical history significant for HTN, atrial fibrillation, HTN, COPD, peripheral vascular disease, CHF, OSA.  Patient recently admitted 11/1-11/6 for left hip replacement done. Per patient he has been [place] well postoperatively.  He states he has had no pain and doing well with PT.  On Sunday he developed cough, wheezing, shortness of breath.  He states is a bit lightheaded.  He denies chest pains.  He denies fever but endorses occasional chills.  He reports intermittent diarrhea, including today.  He denies nausea, or vomiting.  He came to the ER because of his respiratory issues.  In the ER presenting vital stable 125/89, 65, 28, Tmax 99.6.  Satting 89% on room air.  Placed on 2 L.  RSV positive, BNP 948 baseline 438 on 10/28/2022.  Troponin 133 => 137, lactic acid 1.2.  CT chest showed diffuse bilateral bronchial wall thickening and extensive  heterogeneous and ground-glass airspace opacity throughout the lung  bases, consistent with infection or aspiration. Pt positive for RSV infection and presenting with acute hypoxic respiratory failure, likely secondary to RSV. Continue w/ supportive care. Continue on supplemental oxygen and wean as tolerated.   Clinical Impression   [name] presents today with generalized weakness, fatigue, and SOB. He is able to come to EOB sitting with increased time and effort, reporting that all movement is very tiring for him. He comes into standing w/ RW, no physical assistance from therapist required. Pt is able to maintain standing balance for several minutes but reports feeling dizzy and lightheaded and does not have strength to take any steps. His O2 states remain at 93-96% throughout all activity, which is an improvement from yesterday: pt reports that every time he came into  standing yesterday, his O2 sats would drop into low 80s. Pt is eager to go home, where he lives with his daughter and SIL. He reports he can only do so, however, if he is able to move around on his own, get himself to bathroom, etc., as pt is home alone there during the day, when daughter and SIL are at work.     If plan is discharge home, recommend the following: A little help with walking and/or transfers;A little help with bathing/dressing/bathroom;Assistance with cooking/housework;Assist for transportation    Functional Status Assessment  Patient has had a recent decline in their functional status and demonstrates the ability to make significant improvements in function in a reasonable and predictable amount of time.  Equipment Recommendations  None recommended by OT    Recommendations for Other Services       Precautions / Restrictions Precautions Precautions: Fall Precaution Comments: mod fall risk Restrictions Weight Bearing Restrictions Per Provider Order: No Other Position/Activity Restrictions: watch O2 sats      Mobility Bed Mobility Overal bed mobility: Needs Assistance Bed Mobility: Supine to Sit, Sit to Supine     Supine to sit: Contact guard Sit to supine: Supervision   General bed mobility comments: increased time and effort. Pt reports that after moving from supine to sit, he is "exhausted"    Transfers Overall transfer level: Needs assistance Equipment used: Rolling walker (2 wheels) Transfers: Sit to/from Stand Sit to Stand: Supervision                  Balance Overall balance assessment: Needs assistance Sitting-balance support: Bilateral upper extremity  supported Sitting balance-Leahy Scale: Good Sitting balance - Comments: Pt sits HOB in forward flexed posture, struggling to breathe   Standing balance support: Bilateral upper extremity supported, Reliant on assistive device for balance Standing balance-Leahy Scale: Fair Standing balance  comment: Pt able to maintain static standing balance for ~ 4 minutes. Unable to take steps, unable to march in place (reports feels too lightheaded, dizzy, SOB)                           ADL either performed or assessed with clinical judgement   ADL Overall ADL's : Needs assistance/impaired Eating/Feeding: Modified independent;Sitting                         Toilet Transfer Details (indicate cue type and reason): Pt able to use urinal INDly at bed level. Does not have strength, respiratory capacity to ambulate to bathroom at present                 Vision         Perception         Praxis         Pertinent Vitals/Pain Pain Assessment Pain Assessment: 0-10 Pain Score: 3  Pain Location: headache Pain Descriptors / Indicators: Headache Pain Intervention(s): Repositioned, Monitored during session     Extremity/Trunk Assessment Upper Extremity Assessment Upper Extremity Assessment: Generalized weakness   Lower Extremity Assessment Lower Extremity Assessment: Generalized weakness       Communication Communication Communication: Hearing impairment   Cognition Arousal: Alert Behavior During Therapy: WFL for tasks assessed/performed Overall Cognitive Status: Within Functional Limits for tasks assessed                                       General Comments       Exercises Other Exercises Other Exercises: Discussion re: HEP, DC recs   Shoulder Instructions      Home Living Family/patient expects to be discharged to:: Private residence Living Arrangements: Children Available Help at Discharge: Available PRN/intermittently Type of Home: House Home Access: Stairs to enter Secretary/administrator of Steps: 1   Home Layout: One level     Bathroom Shower/Tub: Chief Strategy Officer: Standard     Home Equipment: Rollator (4 wheels);Cane - single point;Wheelchair - manual;Grab bars - tub/shower;Shower seat;Tub  bench;Toilet riser          Prior Functioning/Environment Prior Level of Function : Needs assist             Mobility Comments: uses cane or rollator at baseline ADLs Comments: Pt IND in BADL. Lives with daughter and SIL, they assist with IADL such as driving, shopping, cooking        OT Problem List: Decreased strength;Decreased activity tolerance;Impaired balance (sitting and/or standing);Cardiopulmonary status limiting activity      OT Treatment/Interventions: Self-care/ADL training;Therapeutic exercise;Patient/family education;Balance training;Energy conservation;Therapeutic activities;DME and/or AE instruction    OT Goals(Current goals can be found in the care plan section) Acute Rehab OT Goals Patient Stated Goal: to be able to stay at hospital until he is ready to go home OT Goal Formulation: With patient Time For Goal Achievement: 04/21/23 Potential to Achieve Goals: Good ADL Goals Pt Will Perform Grooming: with modified independence;standing Pt Will Transfer to Toilet: with modified independence;ambulating Pt Will Perform Tub/Shower Transfer: ambulating Pt/caregiver will Perform  Home Exercise Program: Independently (for improved endurance and strength)  OT Frequency: Min 1X/week    Co-evaluation              AM-PAC OT "6 Clicks" Daily Activity     Outcome Measure Help from another person eating meals?: None Help from another person taking care of personal grooming?: A Little Help from another person toileting, which includes using toliet, bedpan, or urinal?: A Lot Help from another person bathing (including washing, rinsing, drying)?: A Lot Help from another person to put on and taking off regular upper body clothing?: A Little Help from another person to put on and taking off regular lower body clothing?: A Lot 6 Click Score: 16   End of Session Equipment Utilized During Treatment: Rolling walker (2 wheels)  Activity Tolerance: Patient limited by  fatigue;Patient tolerated treatment well;Other (comment) (Pt limited by SOB) Patient left: in bed;with call bell/phone within reach  OT Visit Diagnosis: Unsteadiness on feet (R26.81);Muscle weakness (generalized) (M62.81)                Time: 3086-5784 OT Time Calculation (min): 27 min Charges:  OT General Charges $OT Visit: 1 Visit OT Evaluation $OT Eval Moderate Complexity: 1 Mod OT Treatments $Self Care/Home Management : 8-22 mins Latina Craver, PhD, MS, OTR/L 04/07/23, 11:30 AM

## 2023-04-07 NOTE — Plan of Care (Signed)

## 2023-04-07 NOTE — Evaluation (Signed)
Physical Therapy Evaluation Patient Details Name: Dennis Savage MRN: [id number] DOB: [birth year] Today's Date: 04/07/2023  History of Present Illness  Pt is a 89 year old male with past medical history significant for HTN, atrial fibrillation, HTN, COPD, peripheral vascular disease, CHF, OSA.  Patient recently admitted 11/1-11/6 for left hip replacement.  Patient discharged to skilled nursing facility.  Per patient he has been [place] well postoperatively. Pt came to the ER secondary to respiratory issues and intermittent diarrhea.   Clinical Impression  Pt was pleasant and motivated to participate during the session and put forth good effort throughout.  Pt required significant effort with all below functional tasks but no physical assistance.  Once in standing pt able to perform minimal low amplitude standing marches and then take several small steps to the chair before becoming fatigued and needing to return to sitting.  Pt's SpO2 and HR were both WNL on 3LO2/min with no adverse symptoms noted by the patient.  Pt will benefit from continued PT services upon discharge to safely address deficits listed in patient problem list for decreased caregiver assistance and eventual return to PLOF.          If plan is discharge home, recommend the following: A lot of help with walking and/or transfers;A lot of help with bathing/dressing/bathroom;Assistance with cooking/housework;Direct supervision/assist for medications management;Assist for transportation;Help with stairs or ramp for entrance   Can travel by private vehicle   No    Equipment Recommendations Other (comment) (TBD at next venue of care)  Recommendations for Other Services       Functional Status Assessment Patient has had a recent decline in their functional status and demonstrates the ability to make significant improvements in function in a reasonable and predictable amount of time.     Precautions / Restrictions  Precautions Precautions: Fall Precaution Comments: mod fall risk Restrictions Weight Bearing Restrictions Per Provider Order: No Other Position/Activity Restrictions: watch O2 sats      Mobility  Bed Mobility Overal bed mobility: Needs Assistance Bed Mobility: Supine to Sit     Supine to sit: Supervision     General bed mobility comments: Mod extra time and effort but no physical assistance needed    Transfers Overall transfer level: Needs assistance Equipment used: Rolling walker (2 wheels) Transfers: Sit to/from Stand Sit to Stand: Contact guard assist, From elevated surface           General transfer comment: Extra time and effort to come to full upright standing with cues for hand placement    Ambulation/Gait Ambulation/Gait assistance: Contact guard assist Gait Distance (Feet): 4 Feet Assistive device: Rolling walker (2 wheels) Gait Pattern/deviations: Step-through pattern, Decreased step length - right, Decreased step length - left, Trunk flexed Gait velocity: decreased     General Gait Details: Pt able to take a max of 4-5 very small steps with mod lean on the RW for support before needing to return to sitting secondary to fatigue  Stairs            Wheelchair Mobility     Tilt Bed    Modified Rankin (Stroke Patients Only)       Balance Overall balance assessment: Needs assistance Sitting-balance support: Bilateral upper extremity supported Sitting balance-Leahy Scale: Good     Standing balance support: Bilateral upper extremity supported, Reliant on assistive device for balance, During functional activity Standing balance-Leahy Scale: Fair  Pertinent Vitals/Pain Pain Assessment Pain Assessment: No/denies pain    Home Living Family/patient expects to be discharged to:: Private residence Living Arrangements: Children Available Help at Discharge: Available PRN/intermittently Type of Home:  House Home Access: Stairs to enter   [employer] of Steps: 1   Home Layout: One level Home Equipment: Rollator (4 wheels);Cane - single point;Wheelchair - manual;Grab bars - tub/shower;Shower seat;Tub bench;Toilet riser      Prior Function Prior Level of Function : Needs assist             Mobility Comments: Mod Ind amb with a SPC or rollator at baseline ADLs Comments: Pt IND in BADL. Lives with daughter and SIL, they assist with IADL such as driving, shopping, cooking     Extremity/Trunk Assessment   Upper Extremity Assessment Upper Extremity Assessment: Generalized weakness    Lower Extremity Assessment Lower Extremity Assessment: Generalized weakness       Communication   Communication Communication: Hearing impairment Cueing Techniques: Verbal cues  Cognition Arousal: Alert Behavior During Therapy: WFL for tasks assessed/performed Overall Cognitive Status: Within Functional Limits for tasks assessed                                          General Comments      Exercises     Assessment/Plan    PT Assessment Patient needs continued PT services  PT Problem List Decreased strength;Decreased activity tolerance;Decreased balance;Decreased mobility;Decreased knowledge of use of DME       PT Treatment Interventions DME instruction;Gait training;Stair training;Functional mobility training;Therapeutic activities;Therapeutic exercise;Balance training;Patient/family education    PT Goals (Current goals can be found in the Care Plan section)  Acute Rehab PT Goals Patient Stated Goal: To get stronger PT Goal Formulation: With patient Time For Goal Achievement: 04/20/23 Potential to Achieve Goals: Good    Frequency Min 1X/week     Co-evaluation               AM-PAC PT "6 Clicks" Mobility  Outcome Measure Help needed turning from your back to your side while in a flat bed without using bedrails?: A Little Help needed  moving from lying on your back to sitting on the side of a flat bed without using bedrails?: A Little Help needed moving to and from a bed to a chair (including a wheelchair)?: A Little Help needed standing up from a chair using your arms (e.g., wheelchair or bedside chair)?: A Little Help needed to walk in hospital room?: A Lot Help needed climbing 3-5 steps with a railing? : Total 6 Click Score: 15    End of Session Equipment Utilized During Treatment: Gait belt;Oxygen Activity Tolerance: Patient tolerated treatment well Patient left: in chair;with call bell/phone within reach;with chair alarm set Nurse Communication: Mobility status PT Visit Diagnosis: Difficulty in walking, not elsewhere classified (R26.2);Muscle weakness (generalized) (M62.81)    Time: 4098-1191 PT Time Calculation (min) (ACUTE ONLY): 18 min   Charges:   PT Evaluation $PT Eval Moderate Complexity: 1 Mod   PT General Charges $$ ACUTE PT VISIT: 1 Visit    D. Elly Modena PT, DPT 04/07/23, 2:28 PM

## 2023-04-07 NOTE — TOC Initial Note (Signed)
Transition of Care [facility]) - Initial/Assessment Note    Patient Details  Name: Dennis Savage MRN: [id number] Date of Birth: 15-Dec-1929  [facility]) CM/SW Contact:    Liliana Cline, LCSW Phone Number: 04/07/2023, 3:51 PM  Clinical Narrative:                 CSW spoke with patient regarding SNF recommendation. Patient is agreeable to SNF, requested CSW call his daughter Sedalia Muta) regarding SNF preferences. CSW called [name], left a VM requesting a return call. SNF work up started.  Expected Discharge Plan: Skilled Nursing Facility Barriers to Discharge: Continued Medical Work up   Patient Goals and CMS Choice Patient states their goals for this hospitalization and ongoing recovery are:: agreeable to SNF CMS Medicare.gov Compare Post Acute Care list provided to:: Patient Choice offered to / list presented to : Patient      Expected Discharge Plan and Services                                              Prior Living Arrangements/Services     Patient language and need for interpreter reviewed:: Yes        Need for Family Participation in Patient Care: Yes (Comment) Care giver support system in place?: Yes (comment)   Criminal Activity/Legal Involvement Pertinent to Current Situation/Hospitalization: No - Comment as needed  Activities of Daily Living   ADL Screening (condition at time of admission) Independently performs ADLs?: Yes (appropriate for developmental age) Is the patient deaf or have difficulty hearing?: Yes Does the patient have difficulty seeing, even when wearing glasses/contacts?: No Does the patient have difficulty concentrating, remembering, or making decisions?: No  Permission Sought/Granted Permission sought to share information with : Facility Medical sales representative, Family Supports Permission granted to share information with : Yes, Verbal Permission Granted     Permission granted to share info w AGENCY: SNFs  Permission  granted to share info w Relationship: [name] - daughter     Emotional Assessment         Alcohol / Substance Use: Not Applicable Psych Involvement: No (comment)  Admission diagnosis:  Elevated troponin [R79.89] Acute respiratory failure with hypoxia (HCC) [J96.01] RSV bronchiolitis [J21.0] Acute hypoxic respiratory failure (HCC) [J96.01] Patient Active Problem List   Diagnosis Date Noted   RSV (respiratory syncytial virus pneumonia) 04/05/2023   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   Exposure to potentially hazardous substance 01/13/2023   Sepsis due to pneumonia (HCC) 10/28/2022   Essential hypertension 10/28/2022   Paroxysmal atrial fibrillation (HCC) 10/28/2022   GERD without esophagitis 10/28/2022   Chronic diastolic CHF (congestive heart failure) (HCC) 10/28/2022   Chronic obstructive pulmonary disease (COPD) (HCC) 10/28/2022   Acute respiratory failure with hypoxia (HCC) 10/28/2022   Weakness 10/28/2022   Obesity (BMI 30-39.9) 06/21/2021   Pleural effusion on left 06/19/2021   CAP (community acquired pneumonia) 06/18/2021   Acute on chronic diastolic CHF (congestive heart failure) (HCC) 06/18/2021   Adrenal mass, left (HCC) 06/18/2021   Elevated troponin 06/18/2021   CHF (congestive heart failure) (HCC) 06/17/2021   Acute kidney injury superimposed on CKD (HCC) 06/17/2021   Acute CHF (congestive heart failure) (HCC) 06/17/2021   S/P total hip arthroplasty 10/12/2015   Degenerative arthritis of hip 09/12/2015   Atrial fibrillation (HCC) 07/13/2015   Apnea, sleep 07/13/2015   Acute diarrhea 02/25/2015  Dizziness 02/25/2015   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, 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   Retinal hemorrhage 03/16/2014   Interstitial lung disease (HCC) 10/13/2013   After cataract not obscuring vision 12/21/2011   History of surgical procedure 12/21/2011   Constipation 06/15/2011   Encounter for general adult medical examination without abnormal findings  06/15/2011   Arthralgia of multiple joints 06/15/2011   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   Artificial lens present 12/08/2010   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   Benign essential HTN 11/03/2010   COPD with exacerbation (HCC) 11/03/2010   Benign hypertension 11/03/2010   Low back pain 11/03/2010   Peripheral vascular disease (HCC) 11/03/2010   Current tobacco use 11/03/2010   PCP:  Gracelyn Nurse, MD Pharmacy:   [facility] [address] ([address]), [place] - [address] [place] ([place]) [place] [address] Phone: [phone] Fax: [phone]  [facility] - [place], [place] - [address] [place] [address] Phone: [phone] Fax: [phone]  [facility] [place], [place] - [address] [phone] [facility] [address] [place] [address] Phone: [phone] Fax: [phone]     Social Drivers of Health (SDOH) Social History: SDOH Screenings   Food Insecurity: No Food Insecurity (04/06/2023)  Housing: Low Risk  (04/06/2023)  Transportation Needs: No Transportation Needs (04/06/2023)  Utilities: Not At Risk (04/06/2023)  Depression (PHQ2-9): Low Risk  (06/30/2021)  Financial Resource Strain: Low Risk  (03/29/2023)   Received from [facility]  Tobacco Use: Medium Risk (04/05/2023)   SDOH Interventions:     Readmission Risk Interventions     No data to display

## 2023-04-08 DIAGNOSIS — I480 Paroxysmal atrial fibrillation: Secondary | ICD-10-CM

## 2023-04-08 DIAGNOSIS — I10 Essential (primary) hypertension: Secondary | ICD-10-CM

## 2023-04-08 DIAGNOSIS — J441 Chronic obstructive pulmonary disease with (acute) exacerbation: Secondary | ICD-10-CM | POA: Diagnosis not present

## 2023-04-08 DIAGNOSIS — E278 Other specified disorders of adrenal gland: Secondary | ICD-10-CM

## 2023-04-08 DIAGNOSIS — R531 Weakness: Secondary | ICD-10-CM

## 2023-04-08 DIAGNOSIS — N1831 Chronic kidney disease, stage 3a: Secondary | ICD-10-CM | POA: Insufficient documentation

## 2023-04-08 DIAGNOSIS — J9601 Acute respiratory failure with hypoxia: Secondary | ICD-10-CM | POA: Diagnosis not present

## 2023-04-08 DIAGNOSIS — J121 Respiratory syncytial virus pneumonia: Secondary | ICD-10-CM

## 2023-04-08 LAB — CBC
HCT: 37.6 % — ABNORMAL LOW (ref 39.0–52.0)
Hemoglobin: 12.5 g/dL — ABNORMAL LOW (ref 13.0–17.0)
MCH: 29.5 pg (ref 26.0–34.0)
MCHC: 33.2 g/dL (ref 30.0–36.0)
MCV: 88.7 fL (ref 80.0–100.0)
Platelets: 212 10*3/uL (ref 150–400)
RBC: 4.24 MIL/uL (ref 4.22–5.81)
RDW: 13 % (ref 11.5–15.5)
WBC: 7.5 10*3/uL (ref 4.0–10.5)
nRBC: 0 % (ref 0.0–0.2)

## 2023-04-08 LAB — BASIC METABOLIC PANEL
Anion gap: 10 (ref 5–15)
BUN: 46 mg/dL — ABNORMAL HIGH (ref 8–23)
CO2: 28 mmol/L (ref 22–32)
Calcium: 8.7 mg/dL — ABNORMAL LOW (ref 8.9–10.3)
Chloride: 98 mmol/L (ref 98–111)
Creatinine, Ser: 1.45 mg/dL — ABNORMAL HIGH (ref 0.61–1.24)
GFR, Estimated: 45 mL/min — ABNORMAL LOW (ref >60)
Glucose, Bld: 159 mg/dL — ABNORMAL HIGH (ref 70–99)
Potassium: 4.5 mmol/L (ref 3.5–5.1)
Sodium: 136 mmol/L (ref 135–145)

## 2023-04-08 MED ORDER — SODIUM CHLORIDE 0.9 % IV BOLUS
500.0000 mL | Freq: Once | INTRAVENOUS | Status: AC
  Administered 2023-04-08: 500 mL via INTRAVENOUS

## 2023-04-08 NOTE — Assessment & Plan Note (Signed)
Left-sided measuring 6.6 x 5.8 cm.  Enlarged from prior imaging.  Concerned that this may be a cancerous process.

## 2023-04-08 NOTE — Progress Notes (Signed)
Physical Therapy Treatment Patient Details Name: Dennis Savage MRN: [id number] DOB: 1929/08/29 Today's Date: 04/08/2023   History of Present Illness Pt is a 89 year old male with past medical history significant for HTN, atrial fibrillation, HTN, COPD, peripheral vascular disease, CHF, OSA.  Patient recently admitted 11/1-11/6 for left hip replacement.  Patient discharged to skilled nursing facility.  Per patient he has been [place] well postoperatively. Pt came to the ER secondary to respiratory issues and intermittent diarrhea.    PT Comments  Pt alert, agreeable to PT but reported feeling light headed while sitting in recliner. Overall he mobilized with CGA and RW but very limited by symptoms this session. Unable to ambulate, but he did take a few steps forwards/backwards with RW and CGA. BP assessed and potentially orthostatic (systolic BP decreased for 140s in sitting to 120s) but pt very challenged to stay standing long enough to complete reading, needed to sit early. spO2 88-91% on room air throughout, HR 70s-90s. Returned to supine with modA due to symptoms and left with needs in reach and pt reported feeling a bit better. RN notified of pt status. The patient would benefit from further skilled PT intervention to continue to progress towards goals.      If plan is discharge home, recommend the following: A lot of help with walking and/or transfers;A lot of help with bathing/dressing/bathroom;Assistance with cooking/housework;Direct supervision/assist for medications management;Assist for transportation;Help with stairs or ramp for entrance   Can travel by private vehicle     No  Equipment Recommendations  Other (comment) (TBD at next venue of care)    Recommendations for Other Services       Precautions / Restrictions Precautions Precautions: Fall Restrictions Weight Bearing Restrictions Per Provider Order: No Other Position/Activity Restrictions: watch O2 sats      Mobility  Bed Mobility Overal bed mobility: Needs Assistance Bed Mobility: Sit to Supine       Sit to supine: Mod assist   General bed mobility comments: modA due to pt feeling light headed    Transfers Overall transfer level: Needs assistance Equipment used: Rolling walker (2 wheels) Transfers: Sit to/from Stand Sit to Stand: Contact guard assist           General transfer comment: performed twice, CGA and extra time, reliance on BUE support    Ambulation/Gait Ambulation/Gait assistance: Contact guard assist Gait Distance (Feet): 4 Feet Assistive device: Rolling walker (2 wheels)         General Gait Details: pt limited by lightheadedness today. unable to truly ambulate but able to step forwards/backwards   Stairs             Wheelchair Mobility     Tilt Bed    Modified Rankin (Stroke Patients Only)       Balance Overall balance assessment: Needs assistance Sitting-balance support: Bilateral upper extremity supported Sitting balance-Leahy Scale: Good     Standing balance support: Bilateral upper extremity supported, Reliant on assistive device for balance, During functional activity Standing balance-Leahy Scale: Fair                              Cognition Arousal: Alert Behavior During Therapy: WFL for tasks assessed/performed Overall Cognitive Status: Within Functional Limits for tasks assessed  Exercises      General Comments        Pertinent Vitals/Pain Pain Assessment Pain Assessment: No/denies pain    Home Living                          Prior Function            PT Goals (current goals can now be found in the care plan section) Progress towards PT goals: Progressing toward goals    Frequency    Min 1X/week      PT Plan      Co-evaluation              AM-PAC PT "6 Clicks" Mobility   Outcome Measure  Help needed  turning from your back to your side while in a flat bed without using bedrails?: A Little Help needed moving from lying on your back to sitting on the side of a flat bed without using bedrails?: A Little Help needed moving to and from a bed to a chair (including a wheelchair)?: A Little Help needed standing up from a chair using your arms (e.g., wheelchair or bedside chair)?: A Little Help needed to walk in hospital room?: A Lot Help needed climbing 3-5 steps with a railing? : Total 6 Click Score: 15    End of Session Equipment Utilized During Treatment: Gait belt Activity Tolerance: Other (comment) (limited by light headedness) Patient left: with call bell/phone within reach;with bed alarm set;in bed Nurse Communication: Mobility status PT Visit Diagnosis: Difficulty in walking, not elsewhere classified (R26.2);Muscle weakness (generalized) (M62.81)     Time: 1610-9604 PT Time Calculation (min) (ACUTE ONLY): 19 min  Charges:    $Therapeutic Activity: 8-22 mins PT General Charges $$ ACUTE PT VISIT: 1 Visit                     Olga Coaster PT, DPT 11:40 AM,04/08/23

## 2023-04-08 NOTE — Hospital Course (Signed)
89 year old man past medical history of hypertension, atrial fibrillation, hypertension, COPD, peripheral vascular disease, CHF, sleep apnea.  Patient had a recent hip replacement done.  Sunday he developed cough wheezing shortness of breath.  In the ER he was found to be RSV positive and hypoxic with a pulse ox of 89% on room air.  CT scan of the chest showed bilateral bronchial wall thickening and extensive heterogeneous groundglass appearance throughout the lungs consistent with infection.  12/23.  Patient able to come off oxygen at rest.  Need to see how he does with ambulation.  Patient still very weak and unable to stand very long. 12/24.  This morning patient was back on oxygen.  Will have nursing staff taper to off.  Patient felt a little bit stronger today. 12/25.  Patient stated he felt a little bit better but I did wake him up and he did have more upper airway congestion today than yesterday.  Still on oxygen.

## 2023-04-08 NOTE — Assessment & Plan Note (Signed)
Continue Xarelto.  Patient not on rate controlling medications because resting heart rate is on the lower side.

## 2023-04-08 NOTE — Assessment & Plan Note (Signed)
Continue to monitor

## 2023-04-08 NOTE — Assessment & Plan Note (Signed)
RSV pneumonia.  Continue supportive care and try to taper steroids.

## 2023-04-08 NOTE — Assessment & Plan Note (Signed)
 Continue Solu-Medrol

## 2023-04-08 NOTE — Assessment & Plan Note (Signed)
Holding antihypertensive medications.  Patient not able to stand up long enough to check orthostatics completely.  Will give a fluid bolus.

## 2023-04-08 NOTE — Assessment & Plan Note (Signed)
Acute respiratory failure with hypoxia secondary to RSV pneumonia.  Continue steroids and nebulizer treatments and supportive care.  Patient still on oxygen 2 L.  Pulse ox on room air with ambulation ordered.

## 2023-04-08 NOTE — Care Management Important Message (Signed)
Important Message  Patient Details  Name: YAZEED CULVER MRN: [id number] Date of Birth: 10/10/1929   Important Message Given:  Yes - Medicare IM Left IM with PTs Nurse as Pt was on Contact Precautions.    Bernadette Hoit 04/08/2023, 11:43 AM

## 2023-04-08 NOTE — Progress Notes (Signed)
Progress Note   Patient: Dennis Savage LKG:[id number] DOB: [birth year] DOA: 04/05/2023     3 DOS: the patient was seen and examined on 04/08/2023   Brief hospital course: 89 year old man past medical history of hypertension, atrial fibrillation, hypertension, COPD, peripheral vascular disease, CHF, sleep apnea.  Patient had a recent hip replacement done.  Sunday he developed cough wheezing shortness of breath.  In the ER he was found to be RSV positive and hypoxic with a pulse ox of 89% on room air.  CT scan of the chest showed bilateral bronchial wall thickening and extensive heterogeneous groundglass appearance throughout the lungs consistent with infection.  12/23.  Patient able to come off oxygen at rest.  Need to see how he does with ambulation.  Patient still very weak and unable to stand very long.  Assessment and Plan: * Acute respiratory failure with hypoxia (HCC) Acute respiratory failure with hypoxia secondary to RSV pneumonia.  Continue steroids and nebulizer treatments and supportive care.  Patient able to come off oxygen at rest.  Need to see how he does with ambulation.  RSV (respiratory syncytial virus pneumonia) RSV pneumonia.  Continue supportive care and steroids.  COPD with acute exacerbation (HCC) Continue Solu-Medrol  Adrenal mass greater than 4 cm in diameter (HCC) Left-sided measuring 6.6 x 5.8 cm.  Enlarged from prior imaging.  Concerned that this may be a cancerous process.  Benign essential HTN Holding antihypertensive medications.  Patient not able to stand up long enough to check orthostatics completely.  Will give a fluid bolus.  CKD stage 3a, GFR 45-59 ml/min (HCC) Continue to monitor.  Generalized weakness Physical therapy recommending rehab  Paroxysmal atrial fibrillation (HCC) Continue Xarelto        Subjective: Patient complains of shortness of breath.  Still with some cough.  Does not want to go out of the hospital until he is better.   Admitted with acute respiratory failure with hypoxia found to have RSV pneumonia.  Physical Exam: Vitals:   04/07/23 1942 04/08/23 0329 04/08/23 0824 04/08/23 0900  BP: (!) 144/70 (!) 154/85 128/60   Pulse: (!) 52 65 (!) 54   Resp: 18 18 14    Temp: 98.3 F (36.8 C) 97.8 F (36.6 C) 98.9 F (37.2 C)   TempSrc: Oral Oral    SpO2: 97% 95% 95% 92%  Weight:      Height:       Physical Exam HENT:     Head: Normocephalic.     Mouth/Throat:     Pharynx: No oropharyngeal exudate.  Eyes:     General: Lids are normal.     Conjunctiva/sclera: Conjunctivae normal.  Cardiovascular:     Rate and Rhythm: Normal rate. Rhythm irregularly irregular.     Heart sounds: Normal heart sounds, S1 normal and S2 normal.  Pulmonary:     Breath sounds: Examination of the right-middle field reveals decreased breath sounds. Examination of the left-middle field reveals decreased breath sounds. Examination of the right-lower field reveals decreased breath sounds and rhonchi. Examination of the left-lower field reveals decreased breath sounds and rhonchi. Decreased breath sounds and rhonchi present. No wheezing or rales.  Abdominal:     Palpations: Abdomen is soft.     Tenderness: There is no abdominal tenderness.  Musculoskeletal:     Right lower leg: No swelling.     Left lower leg: No swelling.  Skin:    General: Skin is warm.     Findings: No rash.  Neurological:  Mental Status: He is alert and oriented to person, place, and time.     Data Reviewed: Creatinine 1.45, hemoglobin 12.5, RSV positive CT scan shows an enlarging left adrenal mass, bilateral groundglass opacity throughout the lung consistent with infection, numerous mediastinal and hilar lymph nodes could be reactive  Family Communication: Left message for daughter  Disposition: Status is: Inpatient Remains inpatient appropriate because: Continue IV Solu-Medrol  Planned Discharge Destination: Home versus rehab depending on how he  does    Time spent: 28 minutes  Author: Alford Highland, MD 04/08/2023 12:50 PM  For on call review [email or web address].

## 2023-04-08 NOTE — TOC Progression Note (Signed)
Transition of Care [facility]) - Progression Note    Patient Details  Name: Dennis Savage MRN: [id number] Date of Birth: 1929-10-30  Transition of Care [facility]) CM/SW Contact  Chapman Fitch, RN Phone Number: 04/08/2023, 10:16 AM  Clinical Narrative:     VM left for daughter [name] about bed offers.  Met with patient at bedside  He states he is only interested in SNF "if his doctor and daughter are" He requests that I only speak to [name] about SNF  Expected Discharge Plan: Skilled Nursing Facility Barriers to Discharge: Continued Medical Work up  Expected Discharge Plan and Services                                               Social Determinants of Health (SDOH) Interventions SDOH Screenings   Food Insecurity: No Food Insecurity (04/06/2023)  Housing: Low Risk  (04/06/2023)  Transportation Needs: No Transportation Needs (04/06/2023)  Utilities: Not At Risk (04/06/2023)  Depression (PHQ2-9): Low Risk  (06/30/2021)  Financial Resource Strain: Low Risk  (03/29/2023)   Received from [facility]  Tobacco Use: Medium Risk (04/05/2023)    Readmission Risk Interventions     No data to display

## 2023-04-08 NOTE — Plan of Care (Signed)

## 2023-04-08 NOTE — Assessment & Plan Note (Signed)
Physical therapy recommending rehab

## 2023-04-09 DIAGNOSIS — J9601 Acute respiratory failure with hypoxia: Secondary | ICD-10-CM | POA: Diagnosis not present

## 2023-04-09 DIAGNOSIS — J441 Chronic obstructive pulmonary disease with (acute) exacerbation: Secondary | ICD-10-CM | POA: Diagnosis not present

## 2023-04-09 DIAGNOSIS — J121 Respiratory syncytial virus pneumonia: Secondary | ICD-10-CM | POA: Diagnosis not present

## 2023-04-09 DIAGNOSIS — E278 Other specified disorders of adrenal gland: Secondary | ICD-10-CM | POA: Diagnosis not present

## 2023-04-09 MED ORDER — ORAL CARE MOUTH RINSE: 15.0000 mL | OROMUCOSAL | Status: DC | PRN

## 2023-04-09 MED ORDER — METHYLPREDNISOLONE SODIUM SUCC 40 MG IJ SOLR
40.0000 mg | Freq: Two times a day (BID) | INTRAMUSCULAR | Status: DC
  Administered 2023-04-09 – 2023-04-11 (×4): 40 mg via INTRAVENOUS
  Filled 2023-04-09 (×4): qty 1

## 2023-04-09 NOTE — Progress Notes (Signed)
Progress Note   Patient: Dennis Savage:[id number] DOB: 1929/07/16 DOA: 04/05/2023     4 DOS: the patient was seen and examined on 04/09/2023   Brief hospital course: 89 year old man past medical history of hypertension, atrial fibrillation, hypertension, COPD, peripheral vascular disease, CHF, sleep apnea.  Patient had a recent hip replacement done.  Sunday he developed cough wheezing shortness of breath.  In the ER he was found to be RSV positive and hypoxic with a pulse ox of 89% on room air.  CT scan of the chest showed bilateral bronchial wall thickening and extensive heterogeneous groundglass appearance throughout the lungs consistent with infection.  12/23.  Patient able to come off oxygen at rest.  Need to see how he does with ambulation.  Patient still very weak and unable to stand very long. 12/24.  This morning patient was back on oxygen.  Will have nursing staff taper to off.  Patient felt a little bit stronger today.  Assessment and Plan: * Acute respiratory failure with hypoxia (HCC) Acute respiratory failure with hypoxia secondary to RSV pneumonia.  Continue steroids and nebulizer treatments and supportive care.  Patient back on oxygen today.  Asked nursing staff to taper it off.  RSV (respiratory syncytial virus pneumonia) RSV pneumonia.  Continue supportive care and try to taper steroids.  COPD with acute exacerbation (HCC) Continue Solu-Medrol  Adrenal mass greater than 4 cm in diameter (HCC) Left-sided measuring 6.6 x 5.8 cm.  Enlarged from prior imaging.  Concerned that this may be a cancerous process.  Benign essential HTN Holding antihypertensive medications.  Patient not able to stand up long enough to check orthostatics completely.    CKD stage 3a, GFR 45-59 ml/min (HCC) Continue to monitor.  Generalized weakness Physical therapy recommending rehab  Paroxysmal atrial fibrillation (HCC) Continue Xarelto        Subjective: Patient feeling a little  bit better today.  Feels a little bit stronger.  Got into the chair with nursing staff.  Admitted with RSV pneumonia.  Physical Exam: Vitals:   04/09/23 0952 04/09/23 1335 04/09/23 1339 04/09/23 1340  BP:      Pulse:      Resp:      Temp:      TempSrc:      SpO2: 93% 92% (!) 89% 92%  Weight:      Height:       Physical Exam HENT:     Head: Normocephalic.     Mouth/Throat:     Pharynx: No oropharyngeal exudate.  Eyes:     General: Lids are normal.     Conjunctiva/sclera: Conjunctivae normal.  Cardiovascular:     Rate and Rhythm: Normal rate. Rhythm irregularly irregular.     Heart sounds: Normal heart sounds, S1 normal and S2 normal.  Pulmonary:     Breath sounds: Examination of the right-lower field reveals decreased breath sounds. Examination of the left-lower field reveals decreased breath sounds. Decreased breath sounds present. No wheezing, rhonchi or rales.  Abdominal:     Palpations: Abdomen is soft.     Tenderness: There is no abdominal tenderness.  Musculoskeletal:     Right lower leg: No swelling.     Left lower leg: No swelling.  Skin:    General: Skin is warm.     Findings: No rash.  Neurological:     Mental Status: He is alert and oriented to person, place, and time.     Data Reviewed: Creatinine 1.45, sodium 136, hemoglobin 12.5  Family Communication: Spoke with patient's daughter on the phone  Disposition: Status is: Inpatient Remains inpatient appropriate because: This morning was on oxygen again.  Try to taper to off.  Planned Discharge Destination: Rehab versus home with home health    Time spent: 28 minutes  Author: Alford Highland, MD 04/09/2023 2:03 PM  For on call review [email or web address].

## 2023-04-09 NOTE — TOC Progression Note (Signed)
Transition of Care [facility]) - Progression Note    Patient Details  Name: Dennis Savage MRN: [id number] Date of Birth: Jul 08, 1929  Transition of Care [facility]) CM/SW Contact  Chapman Fitch, RN Phone Number: 04/09/2023, 9:20 AM  Clinical Narrative:     Met with patient at bedside He confirms he is agreeable to bed search (bed search already completed), but does not want to make a final decision with out discussing with daughter Sedalia Muta.  Left Vm for diane ( total of 3 messages left)  I notified the patient that TOC has been unable to reach daughter.  He said that he will discuss with her today   Expected Discharge Plan: Skilled Nursing Facility Barriers to Discharge: Continued Medical Work up  Expected Discharge Plan and Services                                               Social Determinants of Health (SDOH) Interventions SDOH Screenings   Food Insecurity: No Food Insecurity (04/06/2023)  Housing: Low Risk  (04/06/2023)  Transportation Needs: No Transportation Needs (04/06/2023)  Utilities: Not At Risk (04/06/2023)  Depression (PHQ2-9): Low Risk  (06/30/2021)  Financial Resource Strain: Low Risk  (03/29/2023)   Received from [facility]  Tobacco Use: Medium Risk (04/05/2023)    Readmission Risk Interventions     No data to display

## 2023-04-09 NOTE — TOC Progression Note (Signed)
Transition of Care [facility]) - Progression Note    Patient Details  Name: Dennis Savage MRN: [id number] Date of Birth: [birth year]  [facility]) CM/SW Contact  Chapman Fitch, RN Phone Number: 04/09/2023, 3:57 PM  Clinical Narrative:     Received return call from daughter [name].  Bed offers presented. She accepts bed at [facility] .  Accepted in HUB and notified Tammy at [facility]   Expected Discharge Plan: Skilled Nursing Facility Barriers to Discharge: Continued Medical Work up  Expected Discharge Plan and Services                                               Social Determinants of Health (SDOH) Interventions SDOH Screenings   Food Insecurity: No Food Insecurity (04/06/2023)  Housing: Low Risk  (04/06/2023)  Transportation Needs: No Transportation Needs (04/06/2023)  Utilities: Not At Risk (04/06/2023)  Depression (PHQ2-9): Low Risk  (06/30/2021)  Financial Resource Strain: Low Risk  (03/29/2023)   Received from [facility]  Tobacco Use: Medium Risk (04/05/2023)    Readmission Risk Interventions     No data to display

## 2023-04-09 NOTE — Plan of Care (Signed)

## 2023-04-09 NOTE — Plan of Care (Signed)

## 2023-04-10 DIAGNOSIS — J121 Respiratory syncytial virus pneumonia: Secondary | ICD-10-CM | POA: Diagnosis not present

## 2023-04-10 DIAGNOSIS — J441 Chronic obstructive pulmonary disease with (acute) exacerbation: Secondary | ICD-10-CM | POA: Diagnosis not present

## 2023-04-10 DIAGNOSIS — E278 Other specified disorders of adrenal gland: Secondary | ICD-10-CM | POA: Diagnosis not present

## 2023-04-10 DIAGNOSIS — J9601 Acute respiratory failure with hypoxia: Secondary | ICD-10-CM | POA: Diagnosis not present

## 2023-04-10 MED ORDER — ALUM & MAG HYDROXIDE-SIMETH 200-200-20 MG/5ML PO SUSP
30.0000 mL | ORAL | Status: DC | PRN
  Administered 2023-04-10: 30 mL via ORAL
  Filled 2023-04-10: qty 30

## 2023-04-10 MED ORDER — PANTOPRAZOLE SODIUM 40 MG PO TBEC
40.0000 mg | DELAYED_RELEASE_TABLET | Freq: Every day | ORAL | Status: DC
  Administered 2023-04-10 – 2023-04-12 (×3): 40 mg via ORAL
  Filled 2023-04-10 (×3): qty 1

## 2023-04-10 NOTE — Progress Notes (Signed)
Progress Note   Patient: Dennis Savage:[id number] DOB: Feb 03, 1930 DOA: 04/05/2023     5 DOS: the patient was seen and examined on 04/10/2023   Brief hospital course: 89 year old man past medical history of hypertension, atrial fibrillation, hypertension, COPD, peripheral vascular disease, CHF, sleep apnea.  Patient had a recent hip replacement done.  Sunday he developed cough wheezing shortness of breath.  In the ER he was found to be RSV positive and hypoxic with a pulse ox of 89% on room air.  CT scan of the chest showed bilateral bronchial wall thickening and extensive heterogeneous groundglass appearance throughout the lungs consistent with infection.  12/23.  Patient able to come off oxygen at rest.  Need to see how he does with ambulation.  Patient still very weak and unable to stand very long. 12/24.  This morning patient was back on oxygen.  Will have nursing staff taper to off.  Patient felt a little bit stronger today. 12/25.  Patient stated he felt a little bit better but I did wake him up and he did have more upper airway congestion today than yesterday.  Still on oxygen.  Assessment and Plan: * Acute respiratory failure with hypoxia (HCC) Acute respiratory failure with hypoxia secondary to RSV pneumonia.  Continue steroids and nebulizer treatments and supportive care.  Patient still on oxygen 2 L.  Pulse ox on room air with ambulation ordered.  RSV (respiratory syncytial virus pneumonia) RSV pneumonia.  Continue supportive care and continue IV steroids.  COPD with acute exacerbation (HCC) Continue Solu-Medrol  Adrenal mass greater than 4 cm in diameter (HCC) Left-sided measuring 6.6 x 5.8 cm.  Enlarged from prior imaging.  Concerned that this may be a cancerous process.  Benign essential HTN Holding antihypertensive medications.    CKD stage 3a, GFR 45-59 ml/min (HCC) Continue to monitor.  Generalized weakness Physical therapy recommending rehab  Paroxysmal atrial  fibrillation (HCC) Continue Xarelto        Subjective: Patient feeling okay when I saw him.  I did wake him up so he did have some upper airway congestion.  Admitted with RSV pneumonia.  Physical Exam: Vitals:   04/09/23 1612 04/09/23 2139 04/10/23 0337 04/10/23 0744  BP: (!) 164/73 (!) 158/70 (!) 142/78 137/62  Pulse: (!) 40 (!) 59 63 (!) 47  Resp:  19 18 16   Temp: 98.2 F (36.8 C) 98.1 F (36.7 C) 98.1 F (36.7 C) 98 F (36.7 C)  TempSrc: Oral Oral Oral   SpO2: 92% 95% 95% 93%  Weight:      Height:       Physical Exam HENT:     Head: Normocephalic.     Mouth/Throat:     Pharynx: No oropharyngeal exudate.  Eyes:     General: Lids are normal.     Conjunctiva/sclera: Conjunctivae normal.  Cardiovascular:     Rate and Rhythm: Normal rate. Rhythm irregularly irregular.     Heart sounds: Normal heart sounds, S1 normal and S2 normal.  Pulmonary:     Breath sounds: Transmitted upper airway sounds present. Examination of the right-middle field reveals wheezing. Examination of the left-middle field reveals wheezing. Examination of the right-lower field reveals decreased breath sounds and rhonchi. Examination of the left-lower field reveals decreased breath sounds and rhonchi. Decreased breath sounds, wheezing and rhonchi present. No rales.  Abdominal:     Palpations: Abdomen is soft.     Tenderness: There is no abdominal tenderness.  Musculoskeletal:     Right lower  leg: No swelling.     Left lower leg: No swelling.  Skin:    General: Skin is warm.     Findings: No rash.  Neurological:     Mental Status: He is alert and oriented to person, place, and time.     Data Reviewed: Creatinine 1.45, hemoglobin 12.5  Family Communication: Updated daughter on the phone  Disposition: Status is: Inpatient Remains inpatient appropriate because: Still trying to taper off oxygen.  Patient with more bronchospasm today.  Patient seen after waking him up.  Planned Discharge  Destination: Home with home health versus rehab depending on progress    Time spent: 28 minutes  Author: Alford Highland, MD 04/10/2023 1:36 PM  For on call review [email or web address].

## 2023-04-10 NOTE — Plan of Care (Signed)

## 2023-04-10 NOTE — Progress Notes (Signed)
SATURATION QUALIFICATIONS: (This note is used to comply with regulatory documentation for home oxygen)  Patient Saturations on Room Air at Rest = 86%  Patient Saturations on Room Air while Ambulating = 83%  Patient Saturations on 4 Liters of oxygen while Ambulating = 92%  Please briefly explain why patient needs home oxygen:  Patient's SpO2 decreased to 83% on RA while ambulating and required 4L [place] to remained at 92% while ambulating.

## 2023-04-11 ENCOUNTER — Inpatient Hospital Stay: Payer: Medicare Other

## 2023-04-11 DIAGNOSIS — J441 Chronic obstructive pulmonary disease with (acute) exacerbation: Secondary | ICD-10-CM | POA: Diagnosis not present

## 2023-04-11 DIAGNOSIS — J121 Respiratory syncytial virus pneumonia: Secondary | ICD-10-CM | POA: Diagnosis not present

## 2023-04-11 DIAGNOSIS — J9601 Acute respiratory failure with hypoxia: Secondary | ICD-10-CM | POA: Diagnosis not present

## 2023-04-11 DIAGNOSIS — R197 Diarrhea, unspecified: Secondary | ICD-10-CM

## 2023-04-11 LAB — CBC
HCT: 37.9 % — ABNORMAL LOW (ref 39.0–52.0)
Hemoglobin: 12.4 g/dL — ABNORMAL LOW (ref 13.0–17.0)
MCH: 29.9 pg (ref 26.0–34.0)
MCHC: 32.7 g/dL (ref 30.0–36.0)
MCV: 91.3 fL (ref 80.0–100.0)
Platelets: 240 10*3/uL (ref 150–400)
RBC: 4.15 MIL/uL — ABNORMAL LOW (ref 4.22–5.81)
RDW: 13 % (ref 11.5–15.5)
WBC: 10.3 10*3/uL (ref 4.0–10.5)
nRBC: 0 % (ref 0.0–0.2)

## 2023-04-11 LAB — BASIC METABOLIC PANEL WITH GFR
Anion gap: 9 (ref 5–15)
BUN: 49 mg/dL — ABNORMAL HIGH (ref 8–23)
CO2: 25 mmol/L (ref 22–32)
Calcium: 8.6 mg/dL — ABNORMAL LOW (ref 8.9–10.3)
Chloride: 101 mmol/L (ref 98–111)
Creatinine, Ser: 1.35 mg/dL — ABNORMAL HIGH (ref 0.61–1.24)
GFR, Estimated: 50 mL/min — ABNORMAL LOW
Glucose, Bld: 123 mg/dL — ABNORMAL HIGH (ref 70–99)
Potassium: 3.7 mmol/L (ref 3.5–5.1)
Sodium: 135 mmol/L (ref 135–145)

## 2023-04-11 MED ORDER — PREDNISONE 20 MG PO TABS
40.0000 mg | ORAL_TABLET | Freq: Every day | ORAL | Status: DC
  Administered 2023-04-12: 40 mg via ORAL
  Filled 2023-04-11: qty 2

## 2023-04-11 MED ORDER — LOPERAMIDE HCL 2 MG PO CAPS
2.0000 mg | ORAL_CAPSULE | Freq: Three times a day (TID) | ORAL | Status: DC | PRN
  Administered 2023-04-11: 2 mg via ORAL
  Filled 2023-04-11: qty 1

## 2023-04-11 NOTE — Progress Notes (Signed)
walked pt about 50-60 feet on RA oxygen 91-92% HR 130s at rest he rebounded to 94%. He did have an episode of HR dropped in 30-40s at rest afterward the walk he was asymptomatic but eventually return to 60s. Placed on 1L for now. MD notified. New orders to discontinue oxygen therapy.

## 2023-04-11 NOTE — Plan of Care (Signed)
  Problem: Education: Goal: Knowledge of General Education information will improve Description Including pain rating scale, medication(s)/side effects and non-pharmacologic comfort measures Outcome: Progressing   

## 2023-04-11 NOTE — Progress Notes (Signed)
Progress Note   Patient: Dennis Savage ZOX:[id number] DOB: April 01, 1930 DOA: 04/05/2023     6 DOS: the patient was seen and examined on 04/11/2023   Brief hospital course: 89 year old man past medical history of hypertension, atrial fibrillation, hypertension, COPD, peripheral vascular disease, CHF, sleep apnea.  Patient had a recent hip replacement done.  Sunday he developed cough wheezing shortness of breath.  In the ER he was found to be RSV positive and hypoxic with a pulse ox of 89% on room air.  CT scan of the chest showed bilateral bronchial wall thickening and extensive heterogeneous groundglass appearance throughout the lungs consistent with infection.  12/23.  Patient able to come off oxygen at rest.  Need to see how he does with ambulation.  Patient still very weak and unable to stand very long. 12/24.  This morning patient was back on oxygen.  Will have nursing staff taper to off.  Patient felt a little bit stronger today. 12/25.  Patient stated he felt a little bit better but I did wake him up and he did have more upper airway congestion today than yesterday.  Still on oxygen.  Patient unable to be tapered off oxygen. 12/26.  Patient had 4 episodes of diarrhea today.  We did given Imodium.  Breathing a little bit better.  Was able to be tapered off oxygen today.  Assessment and Plan: * Acute respiratory failure with hypoxia (HCC) Acute respiratory failure with hypoxia secondary to RSV pneumonia.  Continue steroids and nebulizer treatments and supportive care.  Patient able to come off oxygen with nursing and with physical therapy today.  Diarrhea 4 episodes of diarrhea today (2 with the physical therapist) Imodium given.  Colace discontinued.  RSV (respiratory syncytial virus pneumonia) RSV pneumonia.  Continue supportive care and change IV Solu-Medrol over to prednisone for tomorrow.  Will need a prednisone taper.  COPD with acute exacerbation (HCC) Continue Solu-Medrol today  and switch over to prednisone for tomorrow.  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  Enlarged from prior imaging.  Concerned that this may be a cancerous process.  Will need outpatient follow-up.  Benign essential HTN Holding antihypertensive medications.    CKD stage 3a, GFR 45-59 ml/min (HCC) Continue to monitor.  Today's creatinine 1.35 with a GFR 50.  Generalized weakness Did better with physical therapy today.  Patient still leaning towards going to rehab  Paroxysmal atrial fibrillation (HCC) Continue Xarelto.  Patient not on rate controlling medications because resting heart rate is on the lower side.        Subjective: Patient had a couple episodes of diarrhea and I ordered Imodium and got rid of Colace.  Patient had 2 more episodes of diarrhea with the physical therapist today.  Physical Exam: Vitals:   04/11/23 0349 04/11/23 0745 04/11/23 1100 04/11/23 1516  BP: (!) 151/77 (!) 152/43  (!) 144/60  Pulse: (!) 44 (!) 44  (!) 45  Resp: 19 20  18   Temp: (!) 97.5 F (36.4 C) 98 F (36.7 C)  98 F (36.7 C)  TempSrc: Axillary Oral    SpO2: 96% 95% 91% 92%  Weight:      Height:       Physical Exam HENT:     Head: Normocephalic.     Mouth/Throat:     Pharynx: No oropharyngeal exudate.  Eyes:     General: Lids are normal.     Conjunctiva/sclera: Conjunctivae normal.  Cardiovascular:  Rate and Rhythm: Bradycardia present. Rhythm irregularly irregular.     Heart sounds: Normal heart sounds, S1 normal and S2 normal.  Pulmonary:     Breath sounds: No transmitted upper airway sounds. Examination of the right-lower field reveals decreased breath sounds and rhonchi. Examination of the left-lower field reveals decreased breath sounds and rhonchi. Decreased breath sounds and rhonchi present. No wheezing or rales.  Abdominal:     Palpations: Abdomen is soft.     Tenderness: There is no abdominal tenderness.  Musculoskeletal:      Right lower leg: No swelling.     Left lower leg: No swelling.  Skin:    General: Skin is warm.     Findings: No rash.  Neurological:     Mental Status: He is alert and oriented to person, place, and time.     Data Reviewed: Creatinine 1.35, hemoglobin 12.4, white blood count 10.3  Family Communication: Updated daughter on the phone  Disposition: Status is: Inpatient Remains inpatient appropriate because: Patient had 4 episodes of diarrhea today.  Watch after Imodium dose.  Planned Discharge Destination: Patient leaning towards rehab rather than home with home health    Time spent: 30 minutes Reevaluated after physical therapy evaluation.  Author: Alford Highland, MD 04/11/2023 4:01 PM  For on call review [email or web address].

## 2023-04-11 NOTE — Progress Notes (Signed)
Physical Therapy Treatment Patient Details Name: Dennis Savage MRN: [id number] DOB: Sep 10, 1929 Today's Date: 04/11/2023   History of Present Illness Pt is a 89 year old male with past medical history significant for HTN, atrial fibrillation, HTN, COPD, peripheral vascular disease, CHF, OSA.  Patient recently admitted 11/1-11/6 for left hip replacement.  Patient discharged to skilled nursing facility.  Per patient he has been [place] well postoperatively. Pt came to the ER secondary to respiratory issues and intermittent diarrhea.    PT Comments  Pt was sitting in recliner on 2 L o2 upon arrival. He is A and agreeable. Removed from O2 throughout session with sao2 >91%. He did not require physical assistance to stand and ambulate ~ 75ft with RW. Pt remained on rm air throughout session. RN made aware he was removed. Pt easily and safely ambulated ~ 51ft with RW. HR < 70bpm throughout. One occasion of drop to upper 30 bpms but pt endorses feeling fine. Quickly returns to > 60 bpm with sitting. Distance limited by pt needing to have 2 Bms. Bms remain loose/watery. Environmental health practitioner RN and care team. DC recs changed due to pt's improved abilities. Acute PT will continue to follow and progress as able per current POC.    If plan is discharge home, recommend the following: A lot of help with walking and/or transfers;A lot of help with bathing/dressing/bathroom;Assistance with cooking/housework;Direct supervision/assist for medications management;Assist for transportation;Help with stairs or ramp for entrance     Equipment Recommendations  Other (comment) (Defer to next level of care)       Precautions / Restrictions Precautions Precautions: Fall Restrictions Weight Bearing Restrictions Per Provider Order: No     Mobility  Bed Mobility  General bed mobility comments: pt was in recliner pre/post session    Transfers Overall transfer level: Needs assistance Equipment used: Rolling walker (2  wheels) Transfers: Sit to/from Stand Sit to Stand: Supervision  General transfer comment: pt was on 2 L o2 upon arrival. Removed o2 throughout session with sao2 > 91% on rm air. RN/MD made aware. no physical assistance to stand form recliner or BSC 2 x each. he demonstrates good eccentric control with stand> sit. pt I'ly wiped himself after BMs (2x)    Ambulation/Gait Ambulation/Gait assistance: Supervision Gait Distance (Feet): 75 Feet Assistive device: Rolling walker (2 wheels) Gait Pattern/deviations: Step-through pattern Gait velocity: decreased  General Gait Details: Pt easily ambulate 69ft however distance limited by need to hve BM. pt's sao2 >91% on rm air even with activity. HR remains low, even brady cardic at times but pt endorses feeling totally fine.   Balance Overall balance assessment: Needs assistance Sitting-balance support: Bilateral upper extremity supported Sitting balance-Leahy Scale: Good     Standing balance support: Bilateral upper extremity supported, Reliant on assistive device for balance, During functional activity Standing balance-Leahy Scale: Fair     Cognition Arousal: Alert Behavior During Therapy: WFL for tasks assessed/performed Overall Cognitive Status: Within Functional Limits for tasks assessed    General Comments: HOH but seems to be oriented x 4               Pertinent Vitals/Pain Pain Assessment Pain Assessment: No/denies pain Pain Score: 0-No pain     PT Goals (current goals can now be found in the care plan section) Acute Rehab PT Goals Patient Stated Goal: get stronger Progress towards PT goals: Progressing toward goals    Frequency    Min 1X/week       AM-PAC PT "6 Clicks" Mobility  Outcome Measure  Help needed turning from your back to your side while in a flat bed without using bedrails?: A Little Help needed moving from lying on your back to sitting on the side of a flat bed without using bedrails?: A  Little Help needed moving to and from a bed to a chair (including a wheelchair)?: A Little Help needed standing up from a chair using your arms (e.g., wheelchair or bedside chair)?: A Little Help needed to walk in hospital room?: A Little Help needed climbing 3-5 steps with a railing? : A Little 6 Click Score: 18    End of Session   Activity Tolerance: Patient tolerated treatment well;Patient limited by fatigue Patient left: in chair;with call bell/phone within reach;with chair alarm set Nurse Communication: Mobility status PT Visit Diagnosis: Difficulty in walking, not elsewhere classified (R26.2);Muscle weakness (generalized) (M62.81)     Time: 1610-9604 PT Time Calculation (min) (ACUTE ONLY): 18 min  Charges:    $Gait Training: 8-22 mins PT General Charges $$ ACUTE PT VISIT: 1 Visit                     Jetta Lout PTA 04/11/23, 12:58 PM

## 2023-04-11 NOTE — Assessment & Plan Note (Signed)
4 episodes of diarrhea today (2 with the physical therapist) Imodium given.  Colace discontinued.

## 2023-04-11 NOTE — Plan of Care (Signed)
  Problem: Education: Goal: Knowledge of General Education information will improve Description: Including pain rating scale, medication(s)/side effects and non-pharmacologic comfort measures 04/11/2023 1419 by Risa Grill, RN Outcome: Progressing 04/11/2023 1415 by Risa Grill, RN Outcome: Progressing

## 2023-04-12 DIAGNOSIS — J121 Respiratory syncytial virus pneumonia: Secondary | ICD-10-CM | POA: Diagnosis not present

## 2023-04-12 DIAGNOSIS — J9601 Acute respiratory failure with hypoxia: Secondary | ICD-10-CM | POA: Diagnosis not present

## 2023-04-12 MED ORDER — TRAMADOL HCL 50 MG PO TABS: 50.0000 mg | ORAL_TABLET | Freq: Two times a day (BID) | ORAL | 0 refills | Status: AC | PRN

## 2023-04-12 MED ORDER — PREDNISONE 10 MG PO TABS: ORAL_TABLET | ORAL | 0 refills | Status: DC

## 2023-04-12 MED ORDER — RIVAROXABAN 15 MG PO TABS: 15.0000 mg | ORAL_TABLET | Freq: Every day | ORAL | 0 refills | Status: DC

## 2023-04-12 NOTE — Plan of Care (Signed)

## 2023-04-12 NOTE — Progress Notes (Signed)
Occupational Therapy Treatment Patient Details Name: Dennis Savage MRN: [id number] DOB: Sep 05, 1929 Today's Date: 04/12/2023   History of present illness Pt is a 89 year old male with past medical history significant for HTN, atrial fibrillation, HTN, COPD, peripheral vascular disease, CHF, OSA.  Patient recently admitted 11/1-11/6 for left hip replacement.  Patient discharged to skilled nursing facility.  Per patient he has been [place] well postoperatively. Pt came to the ER secondary to respiratory issues and intermittent diarrhea.   OT comments  Pt. reports being concerned that his cough has returned. Pt. education was provided about energy conservation, work simplification techniques for ADLs, and IADLs. Pt. education was provided about PLB techniques with a visual handout being provided to the Pt. was provided with a visual handout. Pt.'s daily routines were reviewed with the Pt., and the Pt. was assisted in problem solving through anticipated functional needs upon returning home. Pt. continues to benefit from OT services for ADL training, A/E training, and pt. education about  energy conservation, work simplification techniques, home modification, and DME.       If plan is discharge home, recommend the following:  A little help with walking and/or transfers;A little help with bathing/dressing/bathroom;Assistance with cooking/housework;Assist for transportation   Equipment Recommendations  None recommended by OT    Recommendations for Other Services      Precautions / Restrictions Precautions Precautions: Fall Precaution Comments: mod fall risk Restrictions Weight Bearing Restrictions Per Provider Order: No Other Position/Activity Restrictions: watch O2 sats       Mobility Bed Mobility                    Transfers                         Balance                                           ADL either performed or assessed with clinical  judgement   ADL  Pt. Edcuation was provided about energy conservation, work simplification techniques for ADLs, and IADLs.                                            Extremity/Trunk Assessment Upper Extremity Assessment Upper Extremity Assessment: Generalized weakness            Vision Patient Visual Report: No change from baseline     Perception     Praxis      Cognition Arousal: Alert Behavior During Therapy: WFL for tasks assessed/performed Overall Cognitive Status: Within Functional Limits for tasks assessed                                          Exercises      Shoulder Instructions       General Comments      Pertinent Vitals/ Pain       Pain Assessment Pain Assessment: No/denies pain Pain Intervention(s): Repositioned, Monitored during session  Home Living  Prior Functioning/Environment              Frequency  Min 1X/week        Progress Toward Goals  OT Goals(current goals can now be found in the care plan section)  Progress towards OT goals: Progressing toward goals  Acute Rehab OT Goals Patient Stated Goal: To go to rehab OT Goal Formulation: With patient Time For Goal Achievement: 04/21/23 Potential to Achieve Goals: Good  Plan      Co-evaluation                 AM-PAC OT "6 Clicks" Daily Activity     Outcome Measure   Help from another person eating meals?: None Help from another person taking care of personal grooming?: A Little Help from another person toileting, which includes using toliet, bedpan, or urinal?: A Lot Help from another person bathing (including washing, rinsing, drying)?: A Lot Help from another person to put on and taking off regular upper body clothing?: A Little Help from another person to put on and taking off regular lower body clothing?: A Lot 6 Click Score: 16    End of Session Equipment Utilized  During Treatment: Rolling walker (2 wheels)  OT Visit Diagnosis: Unsteadiness on feet (R26.81);Muscle weakness (generalized) (M62.81)   Activity Tolerance Patient limited by fatigue;Patient tolerated treatment well;Other (comment)   Patient Left in bed;with call bell/phone within reach   Nurse Communication          Time: 4540-9811 OT Time Calculation (min): 23 min  Charges: OT General Charges $OT Visit: 1 Visit OT Treatments $Self Care/Home Management : 23-37 mins  Olegario Messier, MS, OTR/L   Olegario Messier 04/12/2023, 12:18 PM

## 2023-04-12 NOTE — Progress Notes (Signed)
IV removed. EMS here to transport.  Dennis Savage [name]

## 2023-04-12 NOTE — Plan of Care (Signed)

## 2023-04-12 NOTE — Progress Notes (Signed)
Report called to [facility] at [facility]

## 2023-04-12 NOTE — TOC Transition Note (Signed)
Transition of Care [facility]) - Discharge Note   Patient Details  Name: Dennis Savage MRN: [id number] Date of Birth: Mar 24, 1930  Transition of Care [facility]) CM/SW Contact:  Margarito Liner, LCSW Phone Number: 04/12/2023, 3:13 PM   Clinical Narrative:  Patient has orders to discharge to [facility] today. RN will call report to [phone] (Room 713P). EMS transport has been arranged and he is 6th on the list. No further concerns. CSW signing off.   Final next level of care: Skilled Nursing Facility Barriers to Discharge: Barriers Resolved   Patient Goals and CMS Choice Patient states their goals for this hospitalization and ongoing recovery are:: agreeable to SNF CMS Medicare.gov Compare Post Acute Care list provided to:: Patient Choice offered to / list presented to : Patient      Discharge Placement   Existing PASRR number confirmed : 04/07/23          Patient chooses bed at: [facility] [place] Patient to be transferred to facility by: EMS Name of family member notified: [name] Patient and family notified of of transfer: 04/12/23  Discharge Plan and Services Additional resources added to the After Visit Summary for                                       Social Drivers of Health (SDOH) Interventions SDOH Screenings   Food Insecurity: No Food Insecurity (04/06/2023)  Housing: Low Risk  (04/06/2023)  Transportation Needs: No Transportation Needs (04/06/2023)  Utilities: Not At Risk (04/06/2023)  Depression (PHQ2-9): Low Risk  (06/30/2021)  Financial Resource Strain: Low Risk  (03/29/2023)   Received from [facility]  Tobacco Use: Medium Risk (04/05/2023)     Readmission Risk Interventions     No data to display

## 2023-04-12 NOTE — Discharge Summary (Addendum)
Physician Discharge Summary  Dennis Savage ZOX:[id number] DOB: 1929/06/11 DOA: 04/05/2023  PCP: Gracelyn Nurse, MD  Admit date: 04/05/2023 Discharge date: 04/12/2023  Admitted From: home  Disposition: SNF  Recommendations for Outpatient Follow-up:  Follow up with PCP in 1-2 weeks   Home Health: no  Equipment/Devices:  Discharge Condition: stable  CODE STATUS: full  Diet recommendation: Heart Healthy  Brief/Interim Summary: HPI was taken from Dr. Ronaldo Miyamoto: This is a 89 year old male with past medical history significant for HTN, atrial fibrillation, HTN, COPD, peripheral vascular disease, CHF, OSA.  Patient recently admitted 11/1-11/6 for left hip replacement done.  Patient discharged to skilled nursing facility.  Per patient he has been [place] well postoperatively.  He states he has had no pain and doing well with PT.  On Sunday he developed cough, wheezing, shortness of breath.  He states is a bit lightheaded.  He denies chest pains.  He denies fever but endorses occasional chills.  He reports intermittent diarrhea, including today.  He denies nausea, or vomiting.  He came to the ER because of his respiratory issues.   In the ER presenting vital stable 125/89, 65, 28, Tmax 99.6.  Satting 89% on room air.  Placed on 2 L.  RSV positive, BNP 948 baseline 438 on 10/28/2022.  Troponin 133 => 137, lactic acid 1.2.   CT chest showed diffuse bilateral bronchial wall thickening and extensive heterogeneous and ground-glass airspace opacity throughout the lung bases, consistent with infection or aspiration.   Discharge Diagnoses:  Principal Problem:   Acute respiratory failure with hypoxia (HCC) Active Problems:   Diarrhea   RSV (respiratory syncytial virus pneumonia)   COPD with acute exacerbation (HCC)   Adrenal mass greater than 4 cm in diameter (HCC)   Benign essential HTN   Paroxysmal atrial fibrillation (HCC)   Generalized weakness   CKD stage 3a, GFR 45-59 ml/min (HCC)  RSV  infection: continue on steroids and start taper. Continue on bronchodilators. Did not get RSV vaccine. Weaned off of supplemental oxygen. Droplet precautions    Acute hypoxic respiratory failure: likely secondary to RSV. Continue w/ supportive care. Weaned off of supplemental oxygen   Diarrhea: imodium prn. No diarrhea today so far    Elevated troponin: likely secondary to demand ischemia. No CP    PAF: continue on xarelto. Not on any rate controlling meds as per med rec    Prostate cancer: with mets. Has adrenal mass greater than 4 cm, enlarged from prior. Management per onco outpatient    HTN: restart home dose of amlodipine, HCTZ   Chronic diastolic CHF: appears euvolemic. Monitor I/Os   CKDIIIa: Cr is trending down from day prior. Avoid nephrotoxic meds    Hypokalemia: WNL today   Discharge Instructions  Discharge Instructions     Diet - low sodium heart healthy   Complete by: As directed    Discharge instructions   Complete by: As directed    F/u w/ PCP in 1-2 weeks   Increase activity slowly   Complete by: As directed       Allergies as of 04/12/2023       Reactions   Lisinopril Cough   Penicillins Hives, Swelling   IgE = 17 ((WNL) on 02/08/2023        Medication List     STOP taking these medications    benzonatate 100 MG capsule Commonly known as: TESSALON   celecoxib 200 MG capsule Commonly known as: CELEBREX   doxycycline 100 MG tablet Commonly  known as: VIBRA-TABS   oxyCODONE 5 MG immediate release tablet Commonly known as: Oxy IR/ROXICODONE       TAKE these medications    acetaminophen 325 MG tablet Commonly known as: TYLENOL Take 650 mg by mouth every 6 (six) hours as needed.   albuterol 108 (90 Base) MCG/ACT inhaler Commonly known as: VENTOLIN HFA Inhale 2 puffs into the lungs every 6 (six) hours as needed for wheezing or shortness of breath.   amLODipine 10 MG tablet Commonly known as: NORVASC Take 10 mg by mouth daily.    CALCIUM 600 + D PO Take 1 tablet by mouth daily.   chlorthalidone 25 MG tablet Commonly known as: HYGROTON Take 25 mg by mouth 2 (two) times a week. Patient takes only on Sunday and Wednesday   cholecalciferol 25 MCG (1000 UNIT) tablet Commonly known as: VITAMIN D3 Take 1,000 Units by mouth daily.   docusate sodium 100 MG capsule Commonly known as: COLACE Take 100 mg by mouth 2 (two) times daily.   Fish Oil 300 MG Caps Take 300 mg by mouth daily.   fluticasone 50 MCG/ACT nasal spray Commonly known as: FLONASE Place 1 spray into both nostrils 2 (two) times daily.   omeprazole 20 MG capsule Commonly known as: PRILOSEC Take 20 mg by mouth daily.   Orgovyx 120 MG tablet Generic drug: relugolix Take 1 tablet (120 mg total) by mouth daily.   predniSONE 10 MG tablet Commonly known as: DELTASONE 40mg  daily x 2 days, 30 mg daily x 2 days, 20mg  daily x 2 days, 10mg  daily x 2 days, then stop Start taking on: April 13, 2023 What changed:  medication strength how much to take how to take this when to take this additional instructions   PRESERVISION AREDS 2 PO Take 1 capsule by mouth 2 (two) times daily.   Rivaroxaban 15 MG Tabs tablet Commonly known as: XARELTO Take 1 tablet (15 mg total) by mouth daily with supper. What changed:  medication strength how much to take   traMADol 50 MG tablet Commonly known as: ULTRAM Take 1 tablet (50 mg total) by mouth 2 (two) times daily as needed for up to 2 days for moderate pain (pain score 4-6) or severe pain (pain score 7-10). What changed:  reasons to take this Another medication with the same name was removed. Continue taking this medication, and follow the directions you see here.   Trelegy Ellipta 100-62.5-25 MCG/ACT Aepb Generic drug: Fluticasone-Umeclidin-Vilant Inhale 1 Inhalation into the lungs at bedtime.        Contact information for after-discharge care     Destination     [facility] Preferred SNF .   Service: Skilled Nursing Contact information: [address] [place] [address] [phone]                    Allergies  Allergen Reactions   Lisinopril Cough   Penicillins Hives and Swelling    IgE = 17 ((WNL) on 02/08/2023    Consultations:   Procedures/Studies: DG Chest Port 1 View Result Date: 04/11/2023 CLINICAL DATA:  89 year old male with hypoxia. EXAM: PORTABLE CHEST 1 VIEW COMPARISON:  CTA chest 04/05/2023 and earlier. FINDINGS: Portable AP semi upright view at 0729 hours. Stable cardiomegaly and mediastinal contours, mediastinal lymphadenopathy demonstrated on CTA. Confluent retrocardiac opacity, not improved. No superimposed pneumothorax, pulmonary edema. Small layering pleural effusions better demonstrated by CT. Right lung base appears stable. Stable visualized osseous structures. IMPRESSION: Confluent  left lung base opacity without improvement since 04/05/2023. Small pleural effusions better demonstrated by CTA. Lungs elsewhere appear stable. Stable cardiomegaly and evidence of mediastinal lymphadenopathy. Electronically Signed   By: Odessa Fleming M.D.   On: 04/11/2023 09:29   CT Angio Chest PE W and/or Wo Contrast Result Date: 04/05/2023 CLINICAL DATA:  Worsening dyspnea on exertion, new hypoxia history of prostate cancer * Tracking Code: BO * EXAM: CT ANGIOGRAPHY CHEST WITH CONTRAST TECHNIQUE: Multidetector CT imaging of the chest was performed using the standard protocol during bolus administration of intravenous contrast. Multiplanar CT image reconstructions and MIPs were obtained to evaluate the vascular anatomy. RADIATION DOSE REDUCTION: This exam was performed according to the departmental dose-optimization program which includes automated exposure control, adjustment of the mA and/or kV according to patient size and/or use of iterative reconstruction technique. CONTRAST:  75mL OMNIPAQUE IOHEXOL 350 MG/ML SOLN COMPARISON:  CT  chest, 08/28/2019 FINDINGS: Cardiovascular: Satisfactory opacification of the pulmonary arteries to the segmental level. No evidence of pulmonary embolism. Cardiomegaly. Left coronary artery calcifications. Enlargement of the main pulmonary artery measuring up to 3.8 cm in caliber. Small pericardial effusion. Mediastinum/Nodes: Numerous prominent mediastinal and hilar lymph nodes. Thyroid gland, trachea, and esophagus demonstrate no significant findings. Lungs/Pleura: Trace bilateral pleural effusions. Diffuse bilateral bronchial wall thickening and extensive heterogeneous and ground-glass airspace opacity throughout the lung bases (series 5, image 106). Upper Abdomen: Continued enlargement of a left adrenal mass measuring 6.6 x 5.8 cm, previously 4.8 x 4.5 cm (series 4, image 154). Musculoskeletal: No chest wall abnormality. No acute osseous findings. Review of the MIP images confirms the above findings. IMPRESSION: 1. Negative examination for pulmonary embolism. 2. Diffuse bilateral bronchial wall thickening and extensive heterogeneous and ground-glass airspace opacity throughout the lung bases, consistent with infection or aspiration. 3. Trace bilateral pleural effusions. 4. Numerous prominent mediastinal and hilar lymph nodes, possibly reactive although concerning for metastases in the setting of patient's known malignancy. 5. Continued enlargement of a left adrenal mass measuring 6.6 x 5.8 cm, previously 4.8 x 4.5 cm. Behavior over time is concerning for a metastasis or primary adrenal malignancy. 6. Cardiomegaly and coronary artery disease. 7. Enlargement of the main pulmonary artery, as can be seen in pulmonary hypertension. Aortic Atherosclerosis (ICD10-I70.0). Electronically Signed   By: Jearld Lesch M.D.   On: 04/05/2023 18:48   DG Chest 2 View Result Date: 04/05/2023 CLINICAL DATA:  Shortness of breath EXAM: CHEST - 2 VIEW COMPARISON:  X-ray 10/28/2022. FINDINGS: Enlarged cardiopericardial  silhouette. Small bilateral pleural effusions with some adjacent lung base opacities. Atelectasis versus infiltrate. Recommend follow-up. No pneumothorax. Dense rounded structure of the left midthorax could be a calcified lung nodule or a bone lesion. Unchanged from previous IMPRESSION: Enlarged heart with small pleural effusions and adjacent opacities. Atelectasis versus infiltrate. Recommend follow-up. Electronically Signed   By: Karen Kays M.D.   On: 04/05/2023 16:16   (Echo, Carotid, EGD, Colonoscopy, ERCP)    Subjective:   Discharge Exam: Vitals:   04/12/23 0700 04/12/23 1406  BP: (!) 158/60 131/63  Pulse: (!) 50 64  Resp: 20 18  Temp: 98 F (36.7 C) 97.9 F (36.6 C)  SpO2: 91% 92%   Vitals:   04/11/23 2005 04/12/23 0403 04/12/23 0700 04/12/23 1406  BP: (!) 155/77 (!) 156/56 (!) 158/60 131/63  Pulse: (!) 58 (!) 48 (!) 50 64  Resp: 16 20 20 18   Temp: 98.2 F (36.8 C) 97.9 F (36.6 C) 98 F (36.7 C) 97.9 F (36.6 C)  TempSrc: Oral Oral Oral Oral  SpO2: 93% 94% 91% 92%  Weight:      Height:        General: Pt is alert, awake, not in acute distress Cardiovascular: RRR, S1/S2 +, no rubs, no gallops Respiratory: CTA bilaterally, no wheezing, no rhonchi Abdominal: Soft, NT, ND, bowel sounds + Extremities: no edema, no cyanosis    The results of significant diagnostics from this hospitalization (including imaging, microbiology, ancillary and laboratory) are listed below for reference.     Microbiology: Recent Results (from the past 240 hours)  Resp panel by RT-PCR (RSV, Flu A&B, Covid) Anterior Nasal Swab     Status: Abnormal   Collection Time: 04/05/23  4:18 PM   Specimen: Anterior Nasal Swab  Result Value Ref Range Status   SARS Coronavirus 2 by RT PCR NEGATIVE NEGATIVE Final    Comment: (NOTE) SARS-CoV-2 target nucleic acids are NOT DETECTED.  The SARS-CoV-2 RNA is generally detectable in upper respiratory specimens during the acute phase of infection. The  lowest concentration of SARS-CoV-2 viral copies this assay can detect is 138 copies/mL. A negative result does not preclude SARS-Cov-2 infection and should not be used as the sole basis for treatment or other patient management decisions. A negative result may occur with  improper specimen collection/handling, submission of specimen other than nasopharyngeal swab, presence of viral mutation(s) within the areas targeted by this assay, and inadequate number of viral copies(<138 copies/mL). A negative result must be combined with clinical observations, patient history, and epidemiological information. The expected result is Negative.  Fact Sheet for Patients:  BloggerCourse.com  Fact Sheet for Healthcare Providers:  SeriousBroker.it  This test is no t yet approved or cleared by the [place] FDA and  has been authorized for detection and/or diagnosis of SARS-CoV-2 by FDA under an Emergency Use Authorization (EUA). This EUA will remain  in effect (meaning this test can be used) for the duration of the COVID-19 declaration under Section 564(b)(1) of the Act, 21 U.S.C.section 360bbb-3(b)(1), unless the authorization is terminated  or revoked sooner.       Influenza A by PCR NEGATIVE NEGATIVE Final   Influenza B by PCR NEGATIVE NEGATIVE Final    Comment: (NOTE) The Xpert Xpress SARS-CoV-2/FLU/RSV plus assay is intended as an aid in the diagnosis of influenza from Nasopharyngeal swab specimens and should not be used as a sole basis for treatment. Nasal washings and aspirates are unacceptable for Xpert Xpress SARS-CoV-2/FLU/RSV testing.  Fact Sheet for Patients: BloggerCourse.com  Fact Sheet for Healthcare Providers: SeriousBroker.it  This test is not yet approved or cleared by the [place] FDA and has been authorized for detection and/or diagnosis of SARS-CoV-2 by FDA under  an Emergency Use Authorization (EUA). This EUA will remain in effect (meaning this test can be used) for the duration of the COVID-19 declaration under Section 564(b)(1) of the Act, 21 U.S.C. section 360bbb-3(b)(1), unless the authorization is terminated or revoked.     Resp Syncytial Virus by PCR POSITIVE (A) NEGATIVE Final    Comment: (NOTE) Fact Sheet for Patients: BloggerCourse.com  Fact Sheet for Healthcare Providers: SeriousBroker.it  This test is not yet approved or cleared by the [place] FDA and has been authorized for detection and/or diagnosis of SARS-CoV-2 by FDA under an Emergency Use Authorization (EUA). This EUA will remain in effect (meaning this test can be used) for the duration of the COVID-19 declaration under Section 564(b)(1) of the Act, 21 U.S.C. section 360bbb-3(b)(1), unless the authorization is  terminated or revoked.  Performed at [facility], [address] [place], [place] [address]      Labs: BNP (last 3 results) Recent Labs    10/28/22 1741 04/05/23 1418  BNP 438.9* 948.3*   Basic Metabolic Panel: Recent Labs  Lab 04/06/23 0535 04/07/23 0310 04/08/23 0438 04/11/23 0550  NA 139 134* 136 135  K 3.1* 4.2 4.5 3.7  CL 100 98 98 101  CO2 29 27 28 25   GLUCOSE 100* 147* 159* 123*  BUN 30* 38* 46* 49*  CREATININE 1.55* 1.49* 1.45* 1.35*  CALCIUM 8.9 8.6* 8.7* 8.6*  MG 2.2  --   --   --    Liver Function Tests: No results for input(s): "AST", "ALT", "ALKPHOS", "BILITOT", "PROT", "ALBUMIN" in the last 168 hours. No results for input(s): "LIPASE", "AMYLASE" in the last 168 hours. No results for input(s): "AMMONIA" in the last 168 hours. CBC: Recent Labs  Lab 04/06/23 0535 04/07/23 0310 04/08/23 0438 04/11/23 0550  WBC 5.0 6.6 7.5 10.3  NEUTROABS 2.9  --   --   --   HGB 12.4* 12.6* 12.5* 12.4*  HCT 38.1* 37.9* 37.6* 37.9*  MCV 91.8 89.8 88.7 91.3  PLT 184 194 212 240    Cardiac Enzymes: No results for input(s): "CKTOTAL", "CKMB", "CKMBINDEX", "TROPONINI" in the last 168 hours. BNP: Invalid input(s): "POCBNP" CBG: No results for input(s): "GLUCAP" in the last 168 hours. D-Dimer No results for input(s): "DDIMER" in the last 72 hours. Hgb A1c No results for input(s): "HGBA1C" in the last 72 hours. Lipid Profile No results for input(s): "CHOL", "HDL", "LDLCALC", "TRIG", "CHOLHDL", "LDLDIRECT" in the last 72 hours. Thyroid function studies No results for input(s): "TSH", "T4TOTAL", "T3FREE", "THYROIDAB" in the last 72 hours.  Invalid input(s): "FREET3" Anemia work up No results for input(s): "VITAMINB12", "FOLATE", "FERRITIN", "TIBC", "IRON", "RETICCTPCT" in the last 72 hours. Urinalysis    Component Value Date/Time   COLORURINE YELLOW (A) 02/08/2023 1352   APPEARANCEUR CLEAR (A) 02/08/2023 1352   APPEARANCEUR Clear 02/14/2022 1022   LABSPEC 1.017 02/08/2023 1352   PHURINE 5.0 02/08/2023 1352   GLUCOSEU NEGATIVE 02/08/2023 1352   HGBUR NEGATIVE 02/08/2023 1352   BILIRUBINUR NEGATIVE 02/08/2023 1352   BILIRUBINUR Negative 02/14/2022 1022   KETONESUR NEGATIVE 02/08/2023 1352   PROTEINUR 100 (A) 02/08/2023 1352   NITRITE NEGATIVE 02/08/2023 1352   LEUKOCYTESUR NEGATIVE 02/08/2023 1352   Sepsis Labs Recent Labs  Lab 04/06/23 0535 04/07/23 0310 04/08/23 0438 04/11/23 0550  WBC 5.0 6.6 7.5 10.3   Microbiology Recent Results (from the past 240 hours)  Resp panel by RT-PCR (RSV, Flu A&B, Covid) Anterior Nasal Swab     Status: Abnormal   Collection Time: 04/05/23  4:18 PM   Specimen: Anterior Nasal Swab  Result Value Ref Range Status   SARS Coronavirus 2 by RT PCR NEGATIVE NEGATIVE Final    Comment: (NOTE) SARS-CoV-2 target nucleic acids are NOT DETECTED.  The SARS-CoV-2 RNA is generally detectable in upper respiratory specimens during the acute phase of infection. The lowest concentration of SARS-CoV-2 viral copies this assay can  detect is 138 copies/mL. A negative result does not preclude SARS-Cov-2 infection and should not be used as the sole basis for treatment or other patient management decisions. A negative result may occur with  improper specimen collection/handling, submission of specimen other than nasopharyngeal swab, presence of viral mutation(s) within the areas targeted by this assay, and inadequate number of viral copies(<138 copies/mL). A negative result must be  combined with clinical observations, patient history, and epidemiological information. The expected result is Negative.  Fact Sheet for Patients:  BloggerCourse.com  Fact Sheet for Healthcare Providers:  SeriousBroker.it  This test is no t yet approved or cleared by the [place] FDA and  has been authorized for detection and/or diagnosis of SARS-CoV-2 by FDA under an Emergency Use Authorization (EUA). This EUA will remain  in effect (meaning this test can be used) for the duration of the COVID-19 declaration under Section 564(b)(1) of the Act, 21 U.S.C.section 360bbb-3(b)(1), unless the authorization is terminated  or revoked sooner.       Influenza A by PCR NEGATIVE NEGATIVE Final   Influenza B by PCR NEGATIVE NEGATIVE Final    Comment: (NOTE) The Xpert Xpress SARS-CoV-2/FLU/RSV plus assay is intended as an aid in the diagnosis of influenza from Nasopharyngeal swab specimens and should not be used as a sole basis for treatment. Nasal washings and aspirates are unacceptable for Xpert Xpress SARS-CoV-2/FLU/RSV testing.  Fact Sheet for Patients: BloggerCourse.com  Fact Sheet for Healthcare Providers: SeriousBroker.it  This test is not yet approved or cleared by the [place] FDA and has been authorized for detection and/or diagnosis of SARS-CoV-2 by FDA under an Emergency Use Authorization (EUA). This EUA will remain in  effect (meaning this test can be used) for the duration of the COVID-19 declaration under Section 564(b)(1) of the Act, 21 U.S.C. section 360bbb-3(b)(1), unless the authorization is terminated or revoked.     Resp Syncytial Virus by PCR POSITIVE (A) NEGATIVE Final    Comment: (NOTE) Fact Sheet for Patients: BloggerCourse.com  Fact Sheet for Healthcare Providers: SeriousBroker.it  This test is not yet approved or cleared by the [place] FDA and has been authorized for detection and/or diagnosis of SARS-CoV-2 by FDA under an Emergency Use Authorization (EUA). This EUA will remain in effect (meaning this test can be used) for the duration of the COVID-19 declaration under Section 564(b)(1) of the Act, 21 U.S.C. section 360bbb-3(b)(1), unless the authorization is terminated or revoked.  Performed at [facility], [address], [place], [place] [address]      Time coordinating discharge: Over 30 minutes  SIGNED:   Charise Killian, MD  [facility] 04/12/2023, 3:57 PM Pager   If 7PM-7AM, please contact night-coverage [email or web address]

## 2023-04-15 ENCOUNTER — Emergency Department: Payer: Medicare Other

## 2023-04-15 ENCOUNTER — Other Ambulatory Visit: Payer: Self-pay

## 2023-04-15 ENCOUNTER — Inpatient Hospital Stay
Admission: EM | Admit: 2023-04-15 | Discharge: 2023-04-30 | DRG: 871 | Disposition: A | Discharge status: Skilled Nursing Facility | Payer: Medicare Other | Source: Skilled Nursing Facility | Attending: Internal Medicine | Admitting: Internal Medicine

## 2023-04-15 DIAGNOSIS — Z880 Allergy status to penicillin: Secondary | ICD-10-CM

## 2023-04-15 DIAGNOSIS — Y95 Nosocomial condition: Secondary | ICD-10-CM | POA: Diagnosis present

## 2023-04-15 DIAGNOSIS — Z79899 Other long term (current) drug therapy: Secondary | ICD-10-CM

## 2023-04-15 DIAGNOSIS — R7989 Other specified abnormal findings of blood chemistry: Secondary | ICD-10-CM | POA: Diagnosis present

## 2023-04-15 DIAGNOSIS — F17210 Nicotine dependence, cigarettes, uncomplicated: Secondary | ICD-10-CM | POA: Diagnosis present

## 2023-04-15 DIAGNOSIS — K589 Irritable bowel syndrome without diarrhea: Secondary | ICD-10-CM | POA: Diagnosis present

## 2023-04-15 DIAGNOSIS — K567 Ileus, unspecified: Secondary | ICD-10-CM | POA: Diagnosis not present

## 2023-04-15 DIAGNOSIS — R1310 Dysphagia, unspecified: Secondary | ICD-10-CM | POA: Diagnosis not present

## 2023-04-15 DIAGNOSIS — I5032 Chronic diastolic (congestive) heart failure: Secondary | ICD-10-CM | POA: Diagnosis present

## 2023-04-15 DIAGNOSIS — Z888 Allergy status to other drugs, medicaments and biological substances status: Secondary | ICD-10-CM

## 2023-04-15 DIAGNOSIS — E279 Disorder of adrenal gland, unspecified: Secondary | ICD-10-CM | POA: Diagnosis present

## 2023-04-15 DIAGNOSIS — A419 Sepsis, unspecified organism: Principal | ICD-10-CM | POA: Diagnosis present

## 2023-04-15 DIAGNOSIS — Z7901 Long term (current) use of anticoagulants: Secondary | ICD-10-CM

## 2023-04-15 DIAGNOSIS — I480 Paroxysmal atrial fibrillation: Secondary | ICD-10-CM | POA: Diagnosis not present

## 2023-04-15 DIAGNOSIS — B974 Respiratory syncytial virus as the cause of diseases classified elsewhere: Secondary | ICD-10-CM | POA: Diagnosis present

## 2023-04-15 DIAGNOSIS — J440 Chronic obstructive pulmonary disease with acute lower respiratory infection: Secondary | ICD-10-CM | POA: Diagnosis present

## 2023-04-15 DIAGNOSIS — E872 Acidosis, unspecified: Secondary | ICD-10-CM | POA: Diagnosis present

## 2023-04-15 DIAGNOSIS — R6520 Severe sepsis without septic shock: Secondary | ICD-10-CM | POA: Diagnosis present

## 2023-04-15 DIAGNOSIS — I739 Peripheral vascular disease, unspecified: Secondary | ICD-10-CM | POA: Diagnosis present

## 2023-04-15 DIAGNOSIS — J189 Pneumonia, unspecified organism: Principal | ICD-10-CM

## 2023-04-15 DIAGNOSIS — R54 Age-related physical debility: Secondary | ICD-10-CM | POA: Diagnosis present

## 2023-04-15 DIAGNOSIS — E663 Overweight: Secondary | ICD-10-CM | POA: Insufficient documentation

## 2023-04-15 DIAGNOSIS — I10 Essential (primary) hypertension: Secondary | ICD-10-CM | POA: Diagnosis present

## 2023-04-15 DIAGNOSIS — N2889 Other specified disorders of kidney and ureter: Secondary | ICD-10-CM | POA: Diagnosis present

## 2023-04-15 DIAGNOSIS — R0602 Shortness of breath: Secondary | ICD-10-CM | POA: Diagnosis not present

## 2023-04-15 DIAGNOSIS — N1831 Chronic kidney disease, stage 3a: Secondary | ICD-10-CM | POA: Diagnosis present

## 2023-04-15 DIAGNOSIS — E876 Hypokalemia: Secondary | ICD-10-CM | POA: Diagnosis present

## 2023-04-15 DIAGNOSIS — J849 Interstitial pulmonary disease, unspecified: Secondary | ICD-10-CM | POA: Diagnosis present

## 2023-04-15 DIAGNOSIS — E871 Hypo-osmolality and hyponatremia: Secondary | ICD-10-CM | POA: Diagnosis present

## 2023-04-15 DIAGNOSIS — K219 Gastro-esophageal reflux disease without esophagitis: Secondary | ICD-10-CM | POA: Diagnosis present

## 2023-04-15 DIAGNOSIS — M1990 Unspecified osteoarthritis, unspecified site: Secondary | ICD-10-CM | POA: Diagnosis present

## 2023-04-15 DIAGNOSIS — D696 Thrombocytopenia, unspecified: Secondary | ICD-10-CM | POA: Diagnosis not present

## 2023-04-15 DIAGNOSIS — Z96643 Presence of artificial hip joint, bilateral: Secondary | ICD-10-CM | POA: Diagnosis present

## 2023-04-15 DIAGNOSIS — Z96653 Presence of artificial knee joint, bilateral: Secondary | ICD-10-CM | POA: Diagnosis present

## 2023-04-15 DIAGNOSIS — I5033 Acute on chronic diastolic (congestive) heart failure: Secondary | ICD-10-CM | POA: Diagnosis not present

## 2023-04-15 DIAGNOSIS — E669 Obesity, unspecified: Secondary | ICD-10-CM | POA: Diagnosis present

## 2023-04-15 DIAGNOSIS — I4510 Unspecified right bundle-branch block: Secondary | ICD-10-CM | POA: Diagnosis present

## 2023-04-15 DIAGNOSIS — I130 Hypertensive heart and chronic kidney disease with heart failure and stage 1 through stage 4 chronic kidney disease, or unspecified chronic kidney disease: Secondary | ICD-10-CM | POA: Diagnosis present

## 2023-04-15 DIAGNOSIS — J159 Unspecified bacterial pneumonia: Secondary | ICD-10-CM | POA: Diagnosis present

## 2023-04-15 LAB — CBC WITH DIFFERENTIAL/PLATELET
Abs Immature Granulocytes: 0.28 10*3/uL — ABNORMAL HIGH (ref 0.00–0.07)
Basophils Absolute: 0 10*3/uL (ref 0.0–0.1)
Basophils Relative: 0 %
Eosinophils Absolute: 0 10*3/uL (ref 0.0–0.5)
Eosinophils Relative: 0 %
HCT: 40.8 % (ref 39.0–52.0)
Hemoglobin: 13.6 g/dL (ref 13.0–17.0)
Immature Granulocytes: 1 %
Lymphocytes Relative: 7 %
Lymphs Abs: 1.6 10*3/uL (ref 0.7–4.0)
MCH: 30.2 pg (ref 26.0–34.0)
MCHC: 33.3 g/dL (ref 30.0–36.0)
MCV: 90.5 fL (ref 80.0–100.0)
Monocytes Absolute: 1.6 10*3/uL — ABNORMAL HIGH (ref 0.1–1.0)
Monocytes Relative: 7 %
Neutro Abs: 19 10*3/uL — ABNORMAL HIGH (ref 1.7–7.7)
Neutrophils Relative %: 85 %
Platelets: 260 10*3/uL (ref 150–400)
RBC: 4.51 MIL/uL (ref 4.22–5.81)
RDW: 13.1 % (ref 11.5–15.5)
WBC: 22.6 10*3/uL — ABNORMAL HIGH (ref 4.0–10.5)
nRBC: 0 % (ref 0.0–0.2)

## 2023-04-15 LAB — RESP PANEL BY RT-PCR (RSV, FLU A&B, COVID)  RVPGX2
Influenza A by PCR: NEGATIVE
Influenza B by PCR: NEGATIVE
Resp Syncytial Virus by PCR: POSITIVE — AB
SARS Coronavirus 2 by RT PCR: NEGATIVE

## 2023-04-15 LAB — COMPREHENSIVE METABOLIC PANEL
ALT: 40 U/L (ref 0–44)
AST: 32 U/L (ref 15–41)
Albumin: 3 g/dL — ABNORMAL LOW (ref 3.5–5.0)
Alkaline Phosphatase: 60 U/L (ref 38–126)
Anion gap: 12 (ref 5–15)
BUN: 40 mg/dL — ABNORMAL HIGH (ref 8–23)
CO2: 20 mmol/L — ABNORMAL LOW (ref 22–32)
Calcium: 8.6 mg/dL — ABNORMAL LOW (ref 8.9–10.3)
Chloride: 102 mmol/L (ref 98–111)
Creatinine, Ser: 1.29 mg/dL — ABNORMAL HIGH (ref 0.61–1.24)
GFR, Estimated: 52 mL/min — ABNORMAL LOW (ref >60)
Glucose, Bld: 155 mg/dL — ABNORMAL HIGH (ref 70–99)
Potassium: 4 mmol/L (ref 3.5–5.1)
Sodium: 134 mmol/L — ABNORMAL LOW (ref 135–145)
Total Bilirubin: 1.2 mg/dL (ref 0.0–1.2)
Total Protein: 5.9 g/dL — ABNORMAL LOW (ref 6.5–8.1)

## 2023-04-15 LAB — LACTIC ACID, PLASMA
Lactic Acid, Venous: 3.4 mmol/L (ref 0.5–1.9)
Lactic Acid, Venous: 3.7 mmol/L (ref 0.5–1.9)

## 2023-04-15 LAB — BRAIN NATRIURETIC PEPTIDE: B Natriuretic Peptide: 613.7 pg/mL — ABNORMAL HIGH (ref 0.0–100.0)

## 2023-04-15 LAB — PROCALCITONIN: Procalcitonin: 0.61 ng/mL

## 2023-04-15 LAB — TROPONIN I (HIGH SENSITIVITY)
Troponin I (High Sensitivity): 142 ng/L (ref <18)
Troponin I (High Sensitivity): 131 ng/L (ref <18)

## 2023-04-15 MED ORDER — SODIUM CHLORIDE 0.9 % IV SOLN
2.0000 g | Freq: Once | INTRAVENOUS | Status: AC
  Administered 2023-04-15: 2 g via INTRAVENOUS
  Filled 2023-04-15: qty 12.5

## 2023-04-15 MED ORDER — ACETAMINOPHEN 650 MG RE SUPP: 650.0000 mg | Freq: Four times a day (QID) | RECTAL | Status: DC | PRN

## 2023-04-15 MED ORDER — ONDANSETRON HCL 4 MG PO TABS
4.0000 mg | ORAL_TABLET | Freq: Four times a day (QID) | ORAL | Status: DC | PRN
  Administered 2023-04-20: 4 mg via ORAL
  Filled 2023-04-15: qty 1

## 2023-04-15 MED ORDER — SODIUM CHLORIDE 0.9 % IV BOLUS (SEPSIS)
1000.0000 mL | Freq: Once | INTRAVENOUS | Status: AC
  Administered 2023-04-15: 1000 mL via INTRAVENOUS

## 2023-04-15 MED ORDER — TRAZODONE HCL 50 MG PO TABS
25.0000 mg | ORAL_TABLET | Freq: Every evening | ORAL | Status: DC | PRN
  Administered 2023-04-21 – 2023-04-29 (×3): 25 mg via ORAL
  Filled 2023-04-15 (×3): qty 1

## 2023-04-15 MED ORDER — SODIUM CHLORIDE 0.9 % IV BOLUS
250.0000 mL | Freq: Once | INTRAVENOUS | Status: AC
  Administered 2023-04-15: 250 mL via INTRAVENOUS

## 2023-04-15 MED ORDER — RELUGOLIX 120 MG PO TABS: 120.0000 mg | ORAL_TABLET | Freq: Every day | ORAL | Status: DC

## 2023-04-15 MED ORDER — IPRATROPIUM-ALBUTEROL 0.5-2.5 (3) MG/3ML IN SOLN
3.0000 mL | Freq: Once | RESPIRATORY_TRACT | Status: AC
  Administered 2023-04-15: 3 mL via RESPIRATORY_TRACT
  Filled 2023-04-15: qty 3

## 2023-04-15 MED ORDER — ZINC OXIDE 40 % EX OINT
TOPICAL_OINTMENT | CUTANEOUS | Status: DC | PRN
Start: 2023-04-15 — End: 2023-04-30
  Filled 2023-04-15: qty 57

## 2023-04-15 MED ORDER — METHYLPREDNISOLONE SODIUM SUCC 125 MG IJ SOLR
125.0000 mg | Freq: Once | INTRAMUSCULAR | Status: AC
  Administered 2023-04-15: 125 mg via INTRAVENOUS
  Filled 2023-04-15: qty 2

## 2023-04-15 MED ORDER — HYDROCOD POLI-CHLORPHE POLI ER 10-8 MG/5ML PO SUER
5.0000 mL | Freq: Two times a day (BID) | ORAL | Status: DC | PRN
  Administered 2023-04-17 – 2023-04-18 (×2): 5 mL via ORAL
  Filled 2023-04-15 (×2): qty 5

## 2023-04-15 MED ORDER — IPRATROPIUM-ALBUTEROL 0.5-2.5 (3) MG/3ML IN SOLN
3.0000 mL | Freq: Four times a day (QID) | RESPIRATORY_TRACT | Status: DC
  Administered 2023-04-16 – 2023-04-17 (×6): 3 mL via RESPIRATORY_TRACT
  Filled 2023-04-15 (×6): qty 3

## 2023-04-15 MED ORDER — GUAIFENESIN ER 600 MG PO TB12
600.0000 mg | ORAL_TABLET | Freq: Two times a day (BID) | ORAL | Status: DC
  Administered 2023-04-15 – 2023-04-30 (×26): 600 mg via ORAL
  Filled 2023-04-15 (×28): qty 1

## 2023-04-15 MED ORDER — ONDANSETRON HCL 4 MG/2ML IJ SOLN
4.0000 mg | Freq: Four times a day (QID) | INTRAMUSCULAR | Status: DC | PRN
  Administered 2023-04-20 – 2023-04-22 (×4): 4 mg via INTRAVENOUS
  Filled 2023-04-15 (×4): qty 2

## 2023-04-15 MED ORDER — RIVAROXABAN 15 MG PO TABS
15.0000 mg | ORAL_TABLET | Freq: Every day | ORAL | Status: DC
  Administered 2023-04-15 – 2023-04-21 (×7): 15 mg via ORAL
  Filled 2023-04-15 (×9): qty 1

## 2023-04-15 MED ORDER — ACETAMINOPHEN 325 MG PO TABS
650.0000 mg | ORAL_TABLET | Freq: Four times a day (QID) | ORAL | Status: DC | PRN
  Administered 2023-04-17: 650 mg via ORAL
  Filled 2023-04-15: qty 2

## 2023-04-15 MED ORDER — ZINC OXIDE 40 % EX OINT: TOPICAL_OINTMENT | CUTANEOUS | Status: DC | PRN

## 2023-04-15 MED ORDER — AMLODIPINE BESYLATE 10 MG PO TABS
10.0000 mg | ORAL_TABLET | Freq: Every day | ORAL | Status: DC
  Administered 2023-04-16 – 2023-04-30 (×13): 10 mg via ORAL
  Filled 2023-04-15 (×12): qty 1
  Filled 2023-04-15: qty 2

## 2023-04-15 MED ORDER — VANCOMYCIN HCL IN DEXTROSE 1-5 GM/200ML-% IV SOLN
1000.0000 mg | Freq: Once | INTRAVENOUS | Status: AC
  Administered 2023-04-15: 1000 mg via INTRAVENOUS
  Filled 2023-04-15: qty 200

## 2023-04-15 MED ORDER — LACTATED RINGERS IV SOLN
150.0000 mL/h | INTRAVENOUS | Status: AC
  Administered 2023-04-16 (×2): 150 mL/h via INTRAVENOUS

## 2023-04-15 MED ORDER — MAGNESIUM HYDROXIDE 400 MG/5ML PO SUSP
30.0000 mL | Freq: Every day | ORAL | Status: DC | PRN
  Filled 2023-04-15: qty 30

## 2023-04-15 MED ORDER — OCUVITE-LUTEIN PO CAPS
1.0000 | ORAL_CAPSULE | Freq: Two times a day (BID) | ORAL | Status: DC
  Filled 2023-04-15 (×9): qty 1

## 2023-04-15 MED ORDER — OYSTER SHELL CALCIUM/D3 500-5 MG-MCG PO TABS
1.0000 | ORAL_TABLET | Freq: Every day | ORAL | Status: DC
  Administered 2023-04-16 – 2023-04-30 (×13): 1 via ORAL
  Filled 2023-04-15 (×13): qty 1

## 2023-04-15 MED ORDER — SODIUM CHLORIDE 0.9 % IV BOLUS
500.0000 mL | Freq: Once | INTRAVENOUS | Status: AC
  Administered 2023-04-15: 500 mL via INTRAVENOUS

## 2023-04-15 MED ORDER — SODIUM CHLORIDE 0.9 % IV SOLN
2.0000 g | INTRAVENOUS | Status: AC
  Administered 2023-04-16 – 2023-04-20 (×5): 2 g via INTRAVENOUS
  Filled 2023-04-15 (×5): qty 20

## 2023-04-15 MED ORDER — SODIUM CHLORIDE 0.9 % IV SOLN
500.0000 mg | INTRAVENOUS | Status: AC
  Administered 2023-04-16 – 2023-04-19 (×5): 500 mg via INTRAVENOUS
  Filled 2023-04-15 (×5): qty 5

## 2023-04-15 MED ORDER — FLUTICASONE PROPIONATE 50 MCG/ACT NA SUSP
1.0000 | Freq: Two times a day (BID) | NASAL | Status: DC
  Administered 2023-04-16 – 2023-04-27 (×22): 1 via NASAL
  Filled 2023-04-15 (×3): qty 16

## 2023-04-15 MED ORDER — OMEGA-3-ACID ETHYL ESTERS 1 G PO CAPS
1.0000 g | ORAL_CAPSULE | Freq: Every day | ORAL | Status: DC
  Administered 2023-04-16 – 2023-04-30 (×13): 1 g via ORAL
  Filled 2023-04-15 (×13): qty 1

## 2023-04-15 MED ORDER — PANTOPRAZOLE SODIUM 40 MG PO TBEC
40.0000 mg | DELAYED_RELEASE_TABLET | Freq: Every day | ORAL | Status: DC
  Administered 2023-04-16 – 2023-04-30 (×13): 40 mg via ORAL
  Filled 2023-04-15 (×13): qty 1

## 2023-04-15 MED ORDER — SODIUM CHLORIDE 0.9 % IV BOLUS
1000.0000 mL | Freq: Once | INTRAVENOUS | Status: AC
  Administered 2023-04-15: 1000 mL via INTRAVENOUS

## 2023-04-15 MED ORDER — VITAMIN D 25 MCG (1000 UNIT) PO TABS
1000.0000 [IU] | ORAL_TABLET | Freq: Every day | ORAL | Status: DC
  Administered 2023-04-16 – 2023-04-30 (×13): 1000 [IU] via ORAL
  Filled 2023-04-15 (×13): qty 1

## 2023-04-15 MED ORDER — CHLORTHALIDONE 25 MG PO TABS
25.0000 mg | ORAL_TABLET | ORAL | Status: DC
  Administered 2023-04-18 – 2023-04-25 (×2): 25 mg via ORAL
  Filled 2023-04-15 (×3): qty 1

## 2023-04-15 NOTE — ED Triage Notes (Signed)
Pt sent over from [facility] for worsening SOB. Pt recently dx with pneumonia. Pt on nasal cannula, normal respiratory effort in triage. Pt endorses productive cough. Pt reports he gets increasingly dyspneic with exertion.

## 2023-04-15 NOTE — H&P (Signed)
[place]   PATIENT NAME: Dennis Savage    MR#:  [id number]  DATE OF BIRTH:  07-Oct-1929  DATE OF ADMISSION:  04/15/2023  PRIMARY CARE PHYSICIAN: Gracelyn Nurse, MD   Patient is coming from: SNF  REQUESTING/REFERRING PHYSICIAN: Gevena Cotton, MD  CHIEF COMPLAINT:   Chief Complaint  Patient presents with   Shortness of Breath    HISTORY OF PRESENT ILLNESS:  Dennis Savage is a 89 y.o. male with medical history significant for hypertension, CHF, COPD, GERD, atrial fibrillation, OSA, PVD and osteoarthritis, who was just discharged 3 days ago after being managed for RSV infection and acute respiratory failure with hypoxia.  Per his family he has not been doing better since he was discharged and continues to have worsening cough with mostly clear sputum expectoration and dyspnea with associated generalized weakness.  He admits to chills without measured fever.  He has not been having much appetite.  His facility sent him as he was felt to be "too sick".  He admits to nausea without vomiting.  No chest pain or palpitations.  He has been having watery diarrhea with no melena or bright red being per rectum.  No dysuria, oliguria or hematuria or flank pain.  No orthopnea or paroxysmal nocturnal dyspnea.  No worsening lower extremity edema.  ED Course: When he came to the ER, BP was 112/55 and later 101/54 with pulse symmetry of 92% on 2 L of O2 by nasal cannula and otherwise normal vital signs.  Labs revealed mild hyponatremia and a CO2 of 20 with a blood glucose of 155, BUN 40 and creatinine 1.29 comparable to 4 days ago and albumin 3 with total protein 5.9.  BNP was 613.7 and high sensitive troponin I was 142 and later 131.  Lactic acid was 3.4 and later 3.7.  CBC showed leukocytosis 22.6 with neutrophilia. EKG as reviewed by me : EKG showed atrial fibrillation with controlled ventricular sponsor of 66 with right bundle branch block. Imaging: Two-view chest x-ray showed persistent  bilateral lower lobe airspace opacities concerning for pneumonia and small bilateral pleural effusions.  The patient was given IV cefepime and vancomycin, 2 DuoNebs, 125 mg IV Solu-Medrol, 30 mL/kg bolus of IV normal saline.  He will be admitted to a progressive unit observation bed for further evaluation and management. 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   Dizziness 02/25/2015   GERD (gastroesophageal reflux disease)    Heart murmur    Hypertension    Interstitial lung disease (HCC) 10/13/2013   LBP (low back pain) 11/03/2010   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   Peripheral vascular disease (HCC) 11/03/2010   Retinal hemorrhage 03/16/2014    PAST SURGICAL HISTORY:   Past Surgical History:  Procedure Laterality Date   APPENDECTOMY     BACK SURGERY     CARPAL TUNNEL RELEASE Bilateral    EYE SURGERY Bilateral    Cataract Extraction with IOL   HERNIA REPAIR     Umbilical Hernia X 3  JOINT REPLACEMENT     bilat knees   NASAL SINUS SURGERY     REPLACEMENT TOTAL KNEE Bilateral    SHOULDER SURGERY Right    TONSILLECTOMY     TOTAL HIP ARTHROPLASTY Right 10/12/2015   Procedure: TOTAL HIP ARTHROPLASTY;  Surgeon: Donato Heinz, MD;  Location: [facility] ORS;  Service: Orthopedics;  Laterality: Right;   TOTAL HIP ARTHROPLASTY Left 02/15/2023   Procedure: TOTAL HIP ARTHROPLASTY;  Surgeon: Donato Heinz, MD;  Location: [facility] ORS;  Service: Orthopedics;  Laterality: Left;    SOCIAL HISTORY:   Social  History   Tobacco Use   Smoking status: Former    Current packs/day: 1.00    Types: Cigarettes   Smokeless tobacco: Never  Substance Use Topics   Alcohol use: No    FAMILY HISTORY:   Family History  Problem Relation Age of Onset   Bladder Cancer Neg Hx    Kidney cancer Neg Hx    Prostate cancer Neg Hx     DRUG ALLERGIES:   Allergies  Allergen Reactions   Lisinopril Cough   Penicillins Hives and Swelling    IgE = 17 ((WNL) on 02/08/2023    REVIEW OF SYSTEMS:   ROS As per history of present illness. All pertinent systems were reviewed above. Constitutional, HEENT, cardiovascular, respiratory, GI, GU, musculoskeletal, neuro, psychiatric, endocrine, integumentary and hematologic systems were reviewed and are otherwise negative/unremarkable except for positive findings mentioned above in the HPI.   MEDICATIONS AT HOME:   Prior to Admission medications   Medication Sig Start Date End Date Taking? Authorizing Provider  acetaminophen (TYLENOL) 325 MG tablet Take 650 mg by mouth every 6 (six) hours as needed. Patient not taking: Reported on 04/06/2023    [provider]  amLODipine (NORVASC) 10 MG tablet Take 10 mg by mouth daily.    [provider]  Calcium Carb-Cholecalciferol (CALCIUM 600 + D PO) Take 1 tablet by mouth daily.    [provider]  chlorthalidone (HYGROTON) 25 MG tablet Take 25 mg by mouth 2 (two) times a week. Patient takes only on Sunday and Wednesday    [provider]  cholecalciferol (VITAMIN D3) 25 MCG (1000 UNIT) tablet Take 1,000 Units by mouth daily.    [provider]  docusate sodium (COLACE) 100 MG capsule Take 100 mg by mouth 2 (two) times daily.    [provider]  fluticasone (FLONASE) 50 MCG/ACT nasal spray Place 1 spray into both nostrils 2 (two) times daily. 03/06/23   [provider]  Multiple Vitamins-Minerals (PRESERVISION AREDS 2 PO) Take 1 capsule by mouth 2 (two) times daily.     [provider]  Omega-3 Fatty Acids (FISH OIL) 300 MG CAPS Take 300 mg by mouth daily.    [provider]  omeprazole (PRILOSEC) 20 MG capsule Take 20 mg by mouth daily. 05/05/22   [provider]  predniSONE (DELTASONE) 10 MG tablet 40mg  daily x 2 days, 30 mg daily x 2 days, 20mg  daily x 2 days, 10mg  daily x 2 days, then stop 04/13/23   Charise Killian, MD  relugolix (ORGOVYX) 120 MG tablet Take 1 tablet (120 mg total) by mouth daily. 02/20/23   Vanna Scotland, MD  Rivaroxaban (XARELTO) 15 MG TABS tablet Take 1 tablet (15 mg total) by mouth daily with supper. 04/12/23 05/12/23  Charise Killian, MD      VITAL SIGNS:  Blood pressure (!) 113/59, pulse (!) 51, temperature 97.6 F (36.4 C), temperature  source Oral, resp. rate 17, SpO2 93%.  PHYSICAL EXAMINATION:  Physical Exam  GENERAL:  89 y.o.-year-old patient lying in the bed with no acute distress.  EYES: Pupils equal, round, reactive to light and accommodation. No scleral icterus. Extraocular muscles intact.  HEENT: Head atraumatic, normocephalic. Oropharynx and nasopharynx clear.  NECK:  Supple, no jugular venous distention. No thyroid enlargement, no tenderness.  LUNGS: Diminished bibasilar breath sounds with bibasal crackles.. No use of accessory muscles of respiration.  CARDIOVASCULAR: Regular rate and rhythm, S1, S2 normal. No murmurs, rubs, or gallops.  ABDOMEN: Soft, nondistended, nontender. Bowel sounds present. No organomegaly or mass.  EXTREMITIES: No pedal edema, cyanosis, or clubbing.  NEUROLOGIC: Cranial nerves II through XII are intact. Muscle strength 5/5 in all extremities. Sensation intact. Gait not checked.  PSYCHIATRIC: The patient is alert and oriented x 3.  Normal affect and good eye contact. SKIN: No obvious rash, lesion, or ulcer.   LABORATORY PANEL:   CBC Recent Labs  Lab 04/15/23 1400  WBC 22.6*  HGB 13.6  HCT 40.8  PLT 260    ------------------------------------------------------------------------------------------------------------------  Chemistries  Recent Labs  Lab 04/15/23 1400  NA 134*  K 4.0  CL 102  CO2 20*  GLUCOSE 155*  BUN 40*  CREATININE 1.29*  CALCIUM 8.6*  AST 32  ALT 40  ALKPHOS 60  BILITOT 1.2   ------------------------------------------------------------------------------------------------------------------  Cardiac Enzymes No results for input(s): "TROPONINI" in the last 168 hours. ------------------------------------------------------------------------------------------------------------------  RADIOLOGY:  DG Chest 2 View Result Date: 04/15/2023 CLINICAL DATA:  Cough and shortness of breath. EXAM: CHEST - 2 VIEW COMPARISON:  04/11/2023 FINDINGS: Stable cardiomediastinal contours. Small bilateral pleural effusions. Persistent bilateral lower lobe airspace opacities concerning for pneumonia. Upper lung zones appear clear. Calcified granuloma identified within the left midlung. Spondylosis noted in the thoracic spine. IMPRESSION: 1. Persistent bilateral lower lobe airspace opacities concerning for pneumonia. 2. Small bilateral pleural effusions. Electronically Signed   By: Signa Kell M.D.   On: 04/15/2023 15:47      IMPRESSION AND PLAN:  Assessment and Plan: * Sepsis due to pneumonia [facility]) - The patient will be admitted to an observation progressive unit bed. - Sepsis manifested by tachypnea of 23 and leukocytosis of 22.6 upon admission. - The patient meets severe sepsis criteria given elevated lactic acid of 3.4-3.7. - Will continue antibiotic therapy with IV Rocephin and Zithromax. - Mucolytic therapy be provided as well as duo nebs q.i.d. and q.4 hours p.r.n. - We will follow blood cultures.   Elevated troponin - This is likely secondary to sepsis.  It has been coming down.  He has no chest pain. - We will follow serial troponins.  Essential hypertension - We  will continue antihypertensive therapy.  Paroxysmal atrial fibrillation (HCC) - We will continue Xarelto.  GERD without esophagitis - We will continue PPI therapy.  Chronic diastolic CHF (congestive heart failure) (HCC) - No clinical exacerbation at this time despite elevated BNP. - We will be cautious with hydration.   DVT prophylaxis: Xarelto Advanced Care Planning:  Code Status: full code. Family Communication:  The plan of care was discussed in details with the patient (and family). I answered all questions. The patient agreed to proceed with the above mentioned plan. Further management will depend upon hospital course. Disposition Plan: Back to previous home environment Consults called: none.  All the records are reviewed and case discussed with ED provider.  Status is: Observation  I certify that at the time of admission, it is my  clinical judgment that the patient will require hospital care extending less than 2 midnights.                            Dispo: The patient is from: SNF              Anticipated d/c is to: SNF              Patient currently is not medically stable to d/c.              Difficult to place patient: No  Hannah Beat M.D on 04/15/2023 at 11:30 PM  [facility]   From 7 PM-7 AM, contact night-coverage [email or web address]  CC: Primary care physician; Gracelyn Nurse, MD

## 2023-04-15 NOTE — Assessment & Plan Note (Signed)
-   We will continue PPI therapy 

## 2023-04-15 NOTE — ED Provider Notes (Signed)
[facility] Provider Note    Event Date/Time   First MD Initiated Contact with Patient 04/15/23 1818     (approximate)   History   Shortness of Breath   HPI  Dennis Savage is a 89 y.o. male  with PMHx obesity, ILD,  COPD, CHF, GERD, HTN, recent diagnosis of RSV s/p hospitalization here with cough, weakness. Pt was just discharged 12/27 to SNF. Per family pt never felt much better, has been coughing, not eating/drinking. Facility sent him here bc he was "too sick." He feels SOB, nauseous, and weak. No CP. No abd pain.       Physical Exam   Triage Vital Signs: ED Triage Vitals  Encounter Vitals Group     BP 04/15/23 1355 (!) 112/55     Systolic BP Percentile --      Diastolic BP Percentile --      Pulse Rate 04/15/23 1355 68     Resp 04/15/23 1355 20     Temp 04/15/23 1355 98.3 F (36.8 C)     Temp Source 04/15/23 1355 Oral     SpO2 04/15/23 1355 92 %     Weight --      Height --      Head Circumference --      Peak Flow --      Pain Score 04/15/23 1357 0     Pain Loc --      Pain Education --      Exclude from Growth Chart --     Most recent vital signs: Vitals:   04/15/23 2030 04/15/23 2239  BP: (!) 113/59   Pulse: (!) 51   Resp: 17   Temp:  97.6 F (36.4 C)  SpO2: 93%      General: Awake, no distress.  CV:  Good peripheral perfusion. Mild bradycardia. Resp:  Slight tachypnea, bilateral rhonchi.  Abd:  No distention.  Other:  No LE edema.   ED Results / Procedures / Treatments   Labs (all labs ordered are listed, but only abnormal results are displayed) Labs Reviewed  RESP PANEL BY RT-PCR (RSV, FLU A&B, COVID)  RVPGX2 - Abnormal; Notable for the following components:      Result Value   Resp Syncytial Virus by PCR POSITIVE (*)    All other components within normal limits  CBC WITH DIFFERENTIAL/PLATELET - Abnormal; Notable for the following components:   WBC 22.6 (*)    Neutro Abs 19.0 (*)    Monocytes Absolute 1.6  (*)    Abs Immature Granulocytes 0.28 (*)    All other components within normal limits  COMPREHENSIVE METABOLIC PANEL - Abnormal; Notable for the following components:   Sodium 134 (*)    CO2 20 (*)    Glucose, Bld 155 (*)    BUN 40 (*)    Creatinine, Ser 1.29 (*)    Calcium 8.6 (*)    Total Protein 5.9 (*)    Albumin 3.0 (*)    GFR, Estimated 52 (*)    All other components within normal limits  BRAIN NATRIURETIC PEPTIDE - Abnormal; Notable for the following components:   B Natriuretic Peptide 613.7 (*)    All other components within normal limits  LACTIC ACID, PLASMA - Abnormal; Notable for the following components:   Lactic Acid, Venous 3.4 (*)    All other components within normal limits  LACTIC ACID, PLASMA - Abnormal; Notable for the following components:   Lactic Acid, Venous 3.7 (*)  All other components within normal limits  TROPONIN I (HIGH SENSITIVITY) - Abnormal; Notable for the following components:   Troponin I (High Sensitivity) 142 (*)    All other components within normal limits  TROPONIN I (HIGH SENSITIVITY) - Abnormal; Notable for the following components:   Troponin I (High Sensitivity) 131 (*)    All other components within normal limits  CULTURE, BLOOD (ROUTINE X 2)  CULTURE, BLOOD (ROUTINE X 2)  PROCALCITONIN  PROTIME-INR  CORTISOL-AM, BLOOD  BASIC METABOLIC PANEL  CBC  LACTIC ACID, PLASMA  LACTIC ACID, PLASMA     EKG Normal sinus rhythm, VR 66.  QRS 138, QTc 448.  No acute ST elevations or depressions.  Right bundle branch block noted.   RADIOLOGY Chest x-ray: Bilateral lower lobe airspace opacities consistent with pneumonia   I also independently reviewed and agree with radiologist interpretations.   PROCEDURES:  Critical Care performed: Yes, see critical care procedure note(s)  .Critical Care  Performed by: Shaune Pollack, MD Authorized by: Shaune Pollack, MD   Critical care provider statement:    Critical care time (minutes):   30   Critical care time was exclusive of:  Separately billable procedures and treating other patients   Critical care was necessary to treat or prevent imminent or life-threatening deterioration of the following conditions:  Cardiac failure, circulatory failure, respiratory failure and sepsis   Critical care was time spent personally by me on the following activities:  Development of treatment plan with patient or surrogate, discussions with consultants, evaluation of patient's response to treatment, examination of patient, ordering and review of laboratory studies, ordering and review of radiographic studies, ordering and performing treatments and interventions, pulse oximetry, re-evaluation of patient's condition and review of old charts     MEDICATIONS ORDERED IN ED: Medications  liver oil-zinc oxide (DESITIN) 40 % ointment ( Topical Given 04/15/23 2129)  relugolix (ORGOVYX) tablet 120 mg (has no administration in time range)  amLODipine (NORVASC) tablet 10 mg (has no administration in time range)  chlorthalidone (HYGROTON) tablet 25 mg (has no administration in time range)  pantoprazole (PROTONIX) EC tablet 40 mg (has no administration in time range)  Rivaroxaban (XARELTO) tablet 15 mg (has no administration in time range)  calcium-vitamin D (OSCAL WITH D) 500-5 MG-MCG per tablet 1 tablet (has no administration in time range)  cholecalciferol (VITAMIN D3) 25 MCG (1000 UNIT) tablet 1,000 Units (has no administration in time range)  multivitamin-lutein (OCUVITE-LUTEIN) capsule 1 capsule (has no administration in time range)  omega-3 acid ethyl esters (LOVAZA) capsule 1 g (has no administration in time range)  fluticasone (FLONASE) 50 MCG/ACT nasal spray 1 spray (has no administration in time range)  lactated ringers infusion (has no administration in time range)  cefTRIAXone (ROCEPHIN) 2 g in sodium chloride 0.9 % 100 mL IVPB (has no administration in time range)  azithromycin (ZITHROMAX)  500 mg in sodium chloride 0.9 % 250 mL IVPB (has no administration in time range)  acetaminophen (TYLENOL) tablet 650 mg (has no administration in time range)    Or  acetaminophen (TYLENOL) suppository 650 mg (has no administration in time range)  traZODone (DESYREL) tablet 25 mg (has no administration in time range)  magnesium hydroxide (MILK OF MAGNESIA) suspension 30 mL (has no administration in time range)  ondansetron (ZOFRAN) tablet 4 mg (has no administration in time range)    Or  ondansetron (ZOFRAN) injection 4 mg (has no administration in time range)  ipratropium-albuterol (DUONEB) 0.5-2.5 (3) MG/3ML nebulizer solution 3  mL (has no administration in time range)  guaiFENesin (MUCINEX) 12 hr tablet 600 mg (has no administration in time range)  chlorpheniramine-HYDROcodone (TUSSIONEX) 10-8 MG/5ML suspension 5 mL (has no administration in time range)  sodium chloride 0.9 % bolus 1,000 mL (0 mLs Intravenous Stopped 04/15/23 2014)  vancomycin (VANCOCIN) IVPB 1000 mg/200 mL premix (0 mg Intravenous Stopped 04/15/23 2111)  ceFEPIme (MAXIPIME) 2 g in sodium chloride 0.9 % 100 mL IVPB (0 g Intravenous Stopped 04/15/23 1947)  methylPREDNISolone sodium succinate (SOLU-MEDROL) 125 mg/2 mL injection 125 mg (125 mg Intravenous Given 04/15/23 2001)  ipratropium-albuterol (DUONEB) 0.5-2.5 (3) MG/3ML nebulizer solution 3 mL (3 mLs Nebulization Given 04/15/23 1957)  ipratropium-albuterol (DUONEB) 0.5-2.5 (3) MG/3ML nebulizer solution 3 mL (3 mLs Nebulization Given 04/15/23 1957)  sodium chloride 0.9 % bolus 1,000 mL (1,000 mLs Intravenous New Bag/Given 04/15/23 2124)  sodium chloride 0.9 % bolus 500 mL (500 mLs Intravenous New Bag/Given 04/15/23 2127)  sodium chloride 0.9 % bolus 250 mL (0 mLs Intravenous Stopped 04/15/23 2259)     IMPRESSION / MDM / ASSESSMENT AND PLAN / ED COURSE  I reviewed the triage vital signs and the nursing notes.                              Differential diagnosis  includes, but is not limited to, superimposed pneumonia, ongoing respiratory failure secondary RSV, COPD exacerbation, aspiration  Patient's presentation is most consistent with acute presentation with potential threat to life or bodily function.  The patient is on the cardiac monitor to evaluate for evidence of arrhythmia and/or significant heart rate changes   89 year old male with past medical history interstitial lung disease, peripheral vascular disease, CHF, recent admission for RSV, here with shortness of breath.  Clinically, concern for bacterial pneumonia in the setting of recent RSV.  Chest x-ray shows multifocal pneumonia.  Patient has lactic acidosis as well as significant leukocytosis above his previous levels.  Patient started on broad-spectrum antibiotics, also given fluids and steroids.  Sepsis protocol initiated.  Patient does have a history of CHF but appears clinically dehydrated.  Admit to stepdown.   FINAL CLINICAL IMPRESSION(S) / ED DIAGNOSES   Final diagnoses:  HCAP (healthcare-associated pneumonia)  Sepsis without acute organ dysfunction, due to unspecified organism [facility])     Rx / DC Orders   ED Discharge Orders     None        Note:  This document was prepared using Dragon voice recognition software and may include unintentional dictation errors.   Shaune Pollack, MD 04/15/23 [phone]

## 2023-04-15 NOTE — Assessment & Plan Note (Addendum)
-   No clinical exacerbation at this time despite elevated BNP. - We will be cautious with hydration.

## 2023-04-15 NOTE — Assessment & Plan Note (Signed)
-   We will continue Xarelto. ?

## 2023-04-15 NOTE — Progress Notes (Signed)
CODE SEPSIS - PHARMACY COMMUNICATION  **Broad Spectrum Antibiotics should be administered within 1 hour of Sepsis diagnosis**  Time Code Sepsis Called/Page Received: 1825  Antibiotics Ordered: Vancomycin, Cefepime  Time of 1st antibiotic administration: 1909   Netta Neat ,PharmD Clinical Pharmacist  04/15/2023  6:26 PM

## 2023-04-15 NOTE — Sepsis Progress Note (Signed)
Notified provider of need to order lactic acid. ° °

## 2023-04-15 NOTE — ED Triage Notes (Signed)
Pt comes via EMS from [facility]. Pt was sob per staff. Pt normally not on O2. Pt was 89%. Fire placed pt on 15L and pt up to 97%. Lungs clear. Pt did feel warm.  Temp-98.2  Pt dx with RSV last week.  120/72 O2 on 2L is 94%  RR 26 ETCO2 28 18 g left wrist.

## 2023-04-15 NOTE — Assessment & Plan Note (Signed)
-   We will continue anti-hypertensive therapy. 

## 2023-04-15 NOTE — Sepsis Progress Note (Signed)
Notified provider of need to order repeat lactic acid. ° °

## 2023-04-15 NOTE — Sepsis Progress Note (Signed)
Notified bedside nurse of need to draw lactic acid.  

## 2023-04-15 NOTE — ED Provider Triage Note (Signed)
Emergency Medicine Provider Triage Evaluation Note  Dennis Savage , a 89 y.o. male  was evaluated in triage.  Pt complains of shortness of breath, sent from [facility] for low O2 on room air.  Review of Systems  Positive:  Negative:   Physical Exam  BP (!) 112/55   Pulse 68   Temp 98.3 F (36.8 C) (Oral)   Resp 20   SpO2 92%  Gen:   Awake, no distress   Resp:  Normal effort  MSK:   Moves extremities without difficulty  Other:    Medical Decision Making  Medically screening exam initiated at 2:00 PM.  Appropriate orders placed.  Dennis Savage was informed that the remainder of the evaluation will be completed by another provider, this initial triage assessment does not replace that evaluation, and the importance of remaining in the ED until their evaluation is complete.  Added 2 L, patient 93%   Faythe Ghee, PA-C 04/15/23 1400

## 2023-04-15 NOTE — Sepsis Progress Note (Signed)
Sepsis protocol monitored by eLink ?

## 2023-04-16 ENCOUNTER — Encounter: Payer: Self-pay | Admitting: Internal Medicine

## 2023-04-16 DIAGNOSIS — R7989 Other specified abnormal findings of blood chemistry: Secondary | ICD-10-CM | POA: Diagnosis not present

## 2023-04-16 DIAGNOSIS — K567 Ileus, unspecified: Secondary | ICD-10-CM | POA: Diagnosis not present

## 2023-04-16 DIAGNOSIS — J849 Interstitial pulmonary disease, unspecified: Secondary | ICD-10-CM | POA: Diagnosis present

## 2023-04-16 DIAGNOSIS — R1310 Dysphagia, unspecified: Secondary | ICD-10-CM | POA: Diagnosis not present

## 2023-04-16 DIAGNOSIS — R0602 Shortness of breath: Secondary | ICD-10-CM | POA: Diagnosis present

## 2023-04-16 DIAGNOSIS — Y95 Nosocomial condition: Secondary | ICD-10-CM | POA: Diagnosis present

## 2023-04-16 DIAGNOSIS — K219 Gastro-esophageal reflux disease without esophagitis: Secondary | ICD-10-CM

## 2023-04-16 DIAGNOSIS — A419 Sepsis, unspecified organism: Secondary | ICD-10-CM | POA: Diagnosis present

## 2023-04-16 DIAGNOSIS — J159 Unspecified bacterial pneumonia: Secondary | ICD-10-CM | POA: Diagnosis present

## 2023-04-16 DIAGNOSIS — I4510 Unspecified right bundle-branch block: Secondary | ICD-10-CM | POA: Diagnosis present

## 2023-04-16 DIAGNOSIS — I5032 Chronic diastolic (congestive) heart failure: Secondary | ICD-10-CM | POA: Diagnosis not present

## 2023-04-16 DIAGNOSIS — R6520 Severe sepsis without septic shock: Secondary | ICD-10-CM | POA: Diagnosis present

## 2023-04-16 DIAGNOSIS — Z7901 Long term (current) use of anticoagulants: Secondary | ICD-10-CM | POA: Diagnosis not present

## 2023-04-16 DIAGNOSIS — E876 Hypokalemia: Secondary | ICD-10-CM | POA: Diagnosis present

## 2023-04-16 DIAGNOSIS — B974 Respiratory syncytial virus as the cause of diseases classified elsewhere: Secondary | ICD-10-CM | POA: Diagnosis present

## 2023-04-16 DIAGNOSIS — K56609 Unspecified intestinal obstruction, unspecified as to partial versus complete obstruction: Secondary | ICD-10-CM | POA: Diagnosis not present

## 2023-04-16 DIAGNOSIS — E872 Acidosis, unspecified: Secondary | ICD-10-CM | POA: Diagnosis present

## 2023-04-16 DIAGNOSIS — I5033 Acute on chronic diastolic (congestive) heart failure: Secondary | ICD-10-CM | POA: Diagnosis not present

## 2023-04-16 DIAGNOSIS — I739 Peripheral vascular disease, unspecified: Secondary | ICD-10-CM | POA: Diagnosis present

## 2023-04-16 DIAGNOSIS — Z96643 Presence of artificial hip joint, bilateral: Secondary | ICD-10-CM | POA: Diagnosis present

## 2023-04-16 DIAGNOSIS — E871 Hypo-osmolality and hyponatremia: Secondary | ICD-10-CM | POA: Diagnosis present

## 2023-04-16 DIAGNOSIS — D696 Thrombocytopenia, unspecified: Secondary | ICD-10-CM | POA: Diagnosis not present

## 2023-04-16 DIAGNOSIS — J440 Chronic obstructive pulmonary disease with acute lower respiratory infection: Secondary | ICD-10-CM | POA: Diagnosis present

## 2023-04-16 DIAGNOSIS — I10 Essential (primary) hypertension: Secondary | ICD-10-CM | POA: Diagnosis not present

## 2023-04-16 DIAGNOSIS — J189 Pneumonia, unspecified organism: Secondary | ICD-10-CM | POA: Diagnosis not present

## 2023-04-16 DIAGNOSIS — F17210 Nicotine dependence, cigarettes, uncomplicated: Secondary | ICD-10-CM | POA: Diagnosis present

## 2023-04-16 DIAGNOSIS — I480 Paroxysmal atrial fibrillation: Secondary | ICD-10-CM | POA: Diagnosis present

## 2023-04-16 DIAGNOSIS — E669 Obesity, unspecified: Secondary | ICD-10-CM | POA: Diagnosis present

## 2023-04-16 DIAGNOSIS — Z96653 Presence of artificial knee joint, bilateral: Secondary | ICD-10-CM | POA: Diagnosis present

## 2023-04-16 DIAGNOSIS — N1831 Chronic kidney disease, stage 3a: Secondary | ICD-10-CM | POA: Diagnosis present

## 2023-04-16 DIAGNOSIS — I130 Hypertensive heart and chronic kidney disease with heart failure and stage 1 through stage 4 chronic kidney disease, or unspecified chronic kidney disease: Secondary | ICD-10-CM | POA: Diagnosis present

## 2023-04-16 LAB — PROTIME-INR
INR: 2.7 — ABNORMAL HIGH (ref 0.8–1.2)
Prothrombin Time: 28.9 s — ABNORMAL HIGH (ref 11.4–15.2)

## 2023-04-16 LAB — CBC
HCT: 35.7 % — ABNORMAL LOW (ref 39.0–52.0)
Hemoglobin: 11.6 g/dL — ABNORMAL LOW (ref 13.0–17.0)
MCH: 29.8 pg (ref 26.0–34.0)
MCHC: 32.5 g/dL (ref 30.0–36.0)
MCV: 91.8 fL (ref 80.0–100.0)
Platelets: 202 10*3/uL (ref 150–400)
RBC: 3.89 MIL/uL — ABNORMAL LOW (ref 4.22–5.81)
RDW: 13.2 % (ref 11.5–15.5)
WBC: 13.1 10*3/uL — ABNORMAL HIGH (ref 4.0–10.5)
nRBC: 0 % (ref 0.0–0.2)

## 2023-04-16 LAB — BASIC METABOLIC PANEL
Anion gap: 10 (ref 5–15)
BUN: 36 mg/dL — ABNORMAL HIGH (ref 8–23)
CO2: 20 mmol/L — ABNORMAL LOW (ref 22–32)
Calcium: 8.1 mg/dL — ABNORMAL LOW (ref 8.9–10.3)
Chloride: 107 mmol/L (ref 98–111)
Creatinine, Ser: 1.3 mg/dL — ABNORMAL HIGH (ref 0.61–1.24)
GFR, Estimated: 52 mL/min — ABNORMAL LOW (ref >60)
Glucose, Bld: 191 mg/dL — ABNORMAL HIGH (ref 70–99)
Potassium: 3.6 mmol/L (ref 3.5–5.1)
Sodium: 137 mmol/L (ref 135–145)

## 2023-04-16 LAB — CORTISOL-AM, BLOOD: Cortisol - AM: 5.7 ug/dL — ABNORMAL LOW (ref 6.7–22.6)

## 2023-04-16 LAB — LACTIC ACID, PLASMA
Lactic Acid, Venous: 2.7 mmol/L (ref 0.5–1.9)
Lactic Acid, Venous: 4.1 mmol/L (ref 0.5–1.9)
Lactic Acid, Venous: 6.9 mmol/L (ref 0.5–1.9)

## 2023-04-16 LAB — MRSA NEXT GEN BY PCR, NASAL: MRSA by PCR Next Gen: NOT DETECTED

## 2023-04-16 NOTE — ED Notes (Signed)
 ED TO INPATIENT HANDOFF REPORT  ED Nurse Name and Phone #: [phone]  S Name/Age/Gender Elza ONEIDA Duncans 89 y.o. male Room/Bed: ED12A/ED12A  Code Status   Code Status: Full Code  Home/SNF/Other Home Patient oriented to: self, place, time, and situation Is this baseline? Yes   Triage Complete: Triage complete  Chief Complaint Sepsis due to pneumonia (HCC) [J18.9, A41.9]  Triage Note Pt comes via EMS from [facility]. Pt was sob per staff. Pt normally not on O2. Pt was 89%. Fire placed pt on 15L and pt up to 97%. Lungs clear. Pt did feel warm.  Temp-98.2  Pt dx with RSV last week.  120/72 O2 on 2L is 94%  RR 26 ETCO2 28 18 g left wrist.  Pt sent over from [facility] for worsening SOB. Pt recently dx with pneumonia. Pt on nasal cannula, normal respiratory effort in triage. Pt endorses productive cough. Pt reports he gets increasingly dyspneic with exertion.   Allergies Allergies  Allergen Reactions   Lisinopril Cough   Penicillins Hives and Swelling    IgE = 17 ((WNL) on 02/08/2023    Level of Care/Admitting Diagnosis ED Disposition     ED Disposition  Admit   Condition  --   Comment  Hospital Area: [facility] [[id number]]  Level of Care: Telemetry Medical [[id number]]  Covid Evaluation: Confirmed COVID Negative  Diagnosis: Sepsis due to pneumonia [facility]) [[id number]]  Admitting Physician: AMIN, SUMAYYA [[id number]]  Attending Physician: AMIN, SUMAYYA [id number]  Certification:: I certify this patient will need inpatient services for at least 2 midnights          B Medical/Surgery History 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   Apnea, sleep 07/13/2015   Arthralgia of multiple joints 06/15/2011   Arthritis    Artificial lens present 12/08/2010   Atrial fibrillation (HCC)    Benign essential HTN 11/03/2010   CHF  (congestive heart failure) (HCC)    Chronic obstructive pulmonary disease (HCC) 11/03/2010   CN (constipation) 06/15/2011   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   Dizziness 02/25/2015   GERD (gastroesophageal reflux disease)    Heart murmur    Hypertension    Interstitial lung disease (HCC) 10/13/2013   LBP (low back pain) 11/03/2010   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   Peripheral vascular disease (HCC) 11/03/2010   Retinal hemorrhage 03/16/2014   Past Surgical History:  Procedure Laterality Date   APPENDECTOMY     BACK SURGERY     CARPAL TUNNEL RELEASE Bilateral    EYE SURGERY Bilateral    Cataract Extraction with IOL   HERNIA REPAIR     Umbilical Hernia X 3   JOINT REPLACEMENT     bilat knees   NASAL SINUS SURGERY     REPLACEMENT TOTAL KNEE Bilateral    SHOULDER SURGERY Right    TONSILLECTOMY     TOTAL HIP ARTHROPLASTY Right 10/12/2015   Procedure: TOTAL HIP ARTHROPLASTY;  Surgeon: Lynwood SHAUNNA Hue, MD;  Location: [facility] ORS;  Service: Orthopedics;  Laterality: Right;   TOTAL HIP ARTHROPLASTY Left 02/15/2023   Procedure: TOTAL HIP ARTHROPLASTY;  Surgeon: Hue Lynwood SHAUNNA, MD;  Location: [facility] ORS;  Service: Orthopedics;  Laterality: Left;     A IV Location/Drains/Wounds Patient Lines/Drains/Airways Status     Active Line/Drains/Airways     Name Placement date Placement time Site Days   Peripheral IV 04/15/23 20 G Right Antecubital  04/15/23  1909  Antecubital  1   Peripheral IV 04/15/23 18 G Left Wrist 04/15/23  --  Wrist  1            Intake/Output Last 24 hours  Intake/Output Summary (Last 24 hours) at 04/16/2023 2045 Last data filed at 04/16/2023 1950 Gross per 24 hour  Intake 4519.61 ml  Output 900 ml  Net 3619.61 ml    Labs/Imaging Results for orders placed or performed during the hospital encounter of 04/15/23 (from the past 48 hours)  CBC with Differential     Status: Abnormal   Collection Time: 04/15/23  2:00 PM  Result Value Ref Range   WBC 22.6 (H) 4.0 -  10.5 K/uL   RBC 4.51 4.22 - 5.81 MIL/uL   Hemoglobin 13.6 13.0 - 17.0 g/dL   HCT 59.1 60.9 - 47.9 %   MCV 90.5 80.0 - 100.0 fL   MCH 30.2 26.0 - 34.0 pg   MCHC 33.3 30.0 - 36.0 g/dL   RDW 86.8 88.4 - 84.4 %   Platelets 260 150 - 400 K/uL   nRBC 0.0 0.0 - 0.2 %   Neutrophils Relative % 85 %   Neutro Abs 19.0 (H) 1.7 - 7.7 K/uL   Lymphocytes Relative 7 %   Lymphs Abs 1.6 0.7 - 4.0 K/uL   Monocytes Relative 7 %   Monocytes Absolute 1.6 (H) 0.1 - 1.0 K/uL   Eosinophils Relative 0 %   Eosinophils Absolute 0.0 0.0 - 0.5 K/uL   Basophils Relative 0 %   Basophils Absolute 0.0 0.0 - 0.1 K/uL   Immature Granulocytes 1 %   Abs Immature Granulocytes 0.28 (H) 0.00 - 0.07 K/uL    Comment: Performed at [facility], [address] [place], [place] [address]  Comprehensive metabolic panel     Status: Abnormal   Collection Time: 04/15/23  2:00 PM  Result Value Ref Range   Sodium 134 (L) 135 - 145 mmol/L   Potassium 4.0 3.5 - 5.1 mmol/L   Chloride 102 98 - 111 mmol/L   CO2 20 (L) 22 - 32 mmol/L   Glucose, Bld 155 (H) 70 - 99 mg/dL    Comment: Glucose reference range applies only to samples taken after fasting for at least 8 hours.   BUN 40 (H) 8 - 23 mg/dL   Creatinine, Ser 8.70 (H) 0.61 - 1.24 mg/dL   Calcium  8.6 (L) 8.9 - 10.3 mg/dL   Total Protein 5.9 (L) 6.5 - 8.1 g/dL   Albumin 3.0 (L) 3.5 - 5.0 g/dL   AST 32 15 - 41 U/L   ALT 40 0 - 44 U/L   Alkaline Phosphatase 60 38 - 126 U/L   Total Bilirubin 1.2 0.0 - 1.2 mg/dL   GFR, Estimated 52 (L) >60 mL/min    Comment: (NOTE) Calculated using the CKD-EPI Creatinine Equation (2021)    Anion gap 12 5 - 15    Comment: Performed at [facility], [address] [place], [place] [address]  Procalcitonin     Status: None   Collection Time: 04/15/23  6:44 PM  Result Value Ref Range   Procalcitonin 0.61 ng/mL    Comment:        Interpretation: PCT > 0.5 ng/mL and <= 2 ng/mL: Systemic infection (sepsis) is possible, but  other conditions are known to elevate PCT as well. (NOTE)       Sepsis PCT Algorithm  Lower Respiratory Tract                                      Infection PCT Algorithm    ----------------------------     ----------------------------         PCT < 0.25 ng/mL                PCT < 0.10 ng/mL          Strongly encourage             Strongly discourage   discontinuation of antibiotics    initiation of antibiotics    ----------------------------     -----------------------------       PCT 0.25 - 0.50 ng/mL            PCT 0.10 - 0.25 ng/mL               OR       >80% decrease in PCT            Discourage initiation of                                            antibiotics      Encourage discontinuation           of antibiotics    ----------------------------     -----------------------------         PCT >= 0.50 ng/mL              PCT 0.26 - 0.50 ng/mL                AND       <80% decrease in PCT             Encourage initiation of                                             antibiotics       Encourage continuation           of antibiotics    ----------------------------     -----------------------------        PCT >= 0.50 ng/mL                  PCT > 0.50 ng/mL               AND         increase in PCT                  Strongly encourage                                      initiation of antibiotics    Strongly encourage escalation           of antibiotics                                     -----------------------------  PCT <= 0.25 ng/mL                                                 OR                                        > 80% decrease in PCT                                      Discontinue / Do not initiate                                             antibiotics  Performed at [facility], [address] [place], [place] [address]   Troponin I (High Sensitivity)     Status: Abnormal   Collection Time:  04/15/23  6:44 PM  Result Value Ref Range   Troponin I (High Sensitivity) 142 (HH) <18 ng/L    Comment: CRITICAL RESULT CALLED TO, READ BACK BY AND VERIFIED WITH LOGAN RIGGINS @1952  ON 04/15/23 SKL (NOTE) Elevated high sensitivity troponin I (hsTnI) values and significant  changes across serial measurements may suggest ACS but many other  chronic and acute conditions are known to elevate hsTnI results.  Refer to the Links section for chest pain algorithms and additional  guidance. Performed at [facility], [address] [place], [place] [address]   Blood culture (routine x 2)     Status: None (Preliminary result)   Collection Time: 04/15/23  7:00 PM   Specimen: BLOOD  Result Value Ref Range   Specimen Description BLOOD BLOOD RIGHT ARM    Special Requests      BOTTLES DRAWN AEROBIC AND ANAEROBIC Blood Culture adequate volume   Culture      NO GROWTH < 12 HOURS Performed at [facility], [address], [place], [place] [address]    Report Status PENDING   Blood culture (routine x 2)     Status: None (Preliminary result)   Collection Time: 04/15/23  7:11 PM   Specimen: BLOOD  Result Value Ref Range   Specimen Description BLOOD BLOOD RIGHT FOREARM    Special Requests      BOTTLES DRAWN AEROBIC AND ANAEROBIC Blood Culture results may not be optimal due to an inadequate volume of blood received in culture bottles   Culture      NO GROWTH < 12 HOURS Performed at [facility], [address], [place], [place] [address]    Report Status PENDING   Brain natriuretic peptide     Status: Abnormal   Collection Time: 04/15/23  7:57 PM  Result Value Ref Range   B Natriuretic Peptide 613.7 (H) 0.0 - 100.0 pg/mL    Comment: Performed at [facility], [address] [place], [place] [address]  Resp panel by RT-PCR (RSV, Flu A&B, Covid) Anterior Nasal Swab     Status: Abnormal   Collection Time: 04/15/23  7:57 PM   Specimen: Anterior Nasal Swab   Result Value Ref Range   SARS Coronavirus 2 by  RT PCR NEGATIVE NEGATIVE    Comment: (NOTE) SARS-CoV-2 target nucleic acids are NOT DETECTED.  The SARS-CoV-2 RNA is generally detectable in upper respiratory specimens during the acute phase of infection. The lowest concentration of SARS-CoV-2 viral copies this assay can detect is 138 copies/mL. A negative result does not preclude SARS-Cov-2 infection and should not be used as the sole basis for treatment or other patient management decisions. A negative result may occur with  improper specimen collection/handling, submission of specimen other than nasopharyngeal swab, presence of viral mutation(s) within the areas targeted by this assay, and inadequate number of viral copies(<138 copies/mL). A negative result must be combined with clinical observations, patient history, and epidemiological information. The expected result is Negative.  Fact Sheet for Patients:  bloggercourse.com  Fact Sheet for Healthcare Providers:  seriousbroker.it  This test is no t yet approved or cleared by the [place]  FDA and  has been authorized for detection and/or diagnosis of SARS-CoV-2 by FDA under an Emergency Use Authorization (EUA). This EUA will remain  in effect (meaning this test can be used) for the duration of the COVID-19 declaration under Section 564(b)(1) of the Act, 21 U.S.C.section 360bbb-3(b)(1), unless the authorization is terminated  or revoked sooner.       Influenza A by PCR NEGATIVE NEGATIVE   Influenza B by PCR NEGATIVE NEGATIVE    Comment: (NOTE) The Xpert Xpress SARS-CoV-2/FLU/RSV plus assay is intended as an aid in the diagnosis of influenza from Nasopharyngeal swab specimens and should not be used as a sole basis for treatment. Nasal washings and aspirates are unacceptable for Xpert Xpress SARS-CoV-2/FLU/RSV testing.  Fact Sheet for  Patients: bloggercourse.com  Fact Sheet for Healthcare Providers: seriousbroker.it  This test is not yet approved or cleared by the [place]  FDA and has been authorized for detection and/or diagnosis of SARS-CoV-2 by FDA under an Emergency Use Authorization (EUA). This EUA will remain in effect (meaning this test can be used) for the duration of the COVID-19 declaration under Section 564(b)(1) of the Act, 21 U.S.C. section 360bbb-3(b)(1), unless the authorization is terminated or revoked.     Resp Syncytial Virus by PCR POSITIVE (A) NEGATIVE    Comment: (NOTE) Fact Sheet for Patients: bloggercourse.com  Fact Sheet for Healthcare Providers: seriousbroker.it  This test is not yet approved or cleared by the [place]  FDA and has been authorized for detection and/or diagnosis of SARS-CoV-2 by FDA under an Emergency Use Authorization (EUA). This EUA will remain in effect (meaning this test can be used) for the duration of the COVID-19 declaration under Section 564(b)(1) of the Act, 21 U.S.C. section 360bbb-3(b)(1), unless the authorization is terminated or revoked.  Performed at [facility], [address] [place], [place] [address]   Lactic acid, plasma     Status: Abnormal   Collection Time: 04/15/23  7:57 PM  Result Value Ref Range   Lactic Acid, Venous 3.4 (HH) 0.5 - 1.9 mmol/L    Comment: CRITICAL RESULT CALLED TO, READ BACK BY AND VERIFIED WITH LOGAN RIGGINS @2039  ON 04/15/23 SKL Performed at [facility], [address] [place], [place] [address]   Troponin I (High Sensitivity)     Status: Abnormal   Collection Time: 04/15/23  9:31 PM  Result Value Ref Range   Troponin I (High Sensitivity) 131 (HH) <18 ng/L    Comment: CRITICAL VALUE NOTED. VALUE IS CONSISTENT WITH PREVIOUSLY REPORTED/CALLED VALUE skl (NOTE) Elevated high sensitivity  troponin I (hsTnI) values and significant  changes across serial measurements may suggest ACS but many other  chronic and acute conditions are known to elevate hsTnI results.  Refer to the Links section for chest pain algorithms and additional  guidance. Performed at [facility], [address] [place], [place] [address]   Lactic acid, plasma     Status: Abnormal   Collection Time: 04/15/23  9:33 PM  Result Value Ref Range   Lactic Acid, Venous 3.7 (HH) 0.5 - 1.9 mmol/L    Comment: CRITICAL VALUE NOTED. VALUE IS CONSISTENT WITH PREVIOUSLY REPORTED/CALLED VALUE skl Performed at [facility], [address] [place], [place] [address]   Lactic acid, plasma     Status: Abnormal   Collection Time: 04/16/23 12:16 AM  Result Value Ref Range   Lactic Acid, Venous 6.9 (HH) 0.5 - 1.9 mmol/L    Comment: CRITICAL VALUE NOTED. VALUE IS CONSISTENT WITH PREVIOUSLY REPORTED/CALLED VALUE skl Performed at [facility], [address] [place], [place] [address]   Lactic acid, plasma     Status: Abnormal   Collection Time: 04/16/23  2:02 AM  Result Value Ref Range   Lactic Acid, Venous 4.1 (HH) 0.5 - 1.9 mmol/L    Comment: CRITICAL VALUE NOTED. VALUE IS CONSISTENT WITH PREVIOUSLY REPORTED/CALLED VALUE SKL Performed at [facility], [address] [place], [place] [address]   Protime-INR     Status: Abnormal   Collection Time: 04/16/23  5:28 AM  Result Value Ref Range   Prothrombin Time 28.9 (H) 11.4 - 15.2 seconds   INR 2.7 (H) 0.8 - 1.2    Comment: (NOTE) INR goal varies based on device and disease states. Performed at [facility], [address] [place], [place] [address]   Cortisol-am, blood     Status: Abnormal   Collection Time: 04/16/23  5:28 AM  Result Value Ref Range   Cortisol - AM 5.7 (L) 6.7 - 22.6 ug/dL    Comment: Performed at [facility], [address], [place], [place] [address]  Basic metabolic panel      Status: Abnormal   Collection Time: 04/16/23  5:28 AM  Result Value Ref Range   Sodium 137 135 - 145 mmol/L   Potassium 3.6 3.5 - 5.1 mmol/L   Chloride 107 98 - 111 mmol/L   CO2 20 (L) 22 - 32 mmol/L   Glucose, Bld 191 (H) 70 - 99 mg/dL    Comment: Glucose reference range applies only to samples taken after fasting for at least 8 hours.   BUN 36 (H) 8 - 23 mg/dL   Creatinine, Ser 8.69 (H) 0.61 - 1.24 mg/dL   Calcium  8.1 (L) 8.9 - 10.3 mg/dL   GFR, Estimated 52 (L) >60 mL/min    Comment: (NOTE) Calculated using the CKD-EPI Creatinine Equation (2021)    Anion gap 10 5 - 15    Comment: Performed at [facility], [address] [place], [place] [address]  CBC     Status: Abnormal   Collection Time: 04/16/23  5:28 AM  Result Value Ref Range   WBC 13.1 (H) 4.0 - 10.5 K/uL   RBC 3.89 (L) 4.22 - 5.81 MIL/uL   Hemoglobin 11.6 (L) 13.0 - 17.0 g/dL   HCT 64.2 (L) 60.9 - 47.9 %   MCV 91.8 80.0 - 100.0 fL   MCH 29.8 26.0 - 34.0 pg   MCHC 32.5 30.0 - 36.0 g/dL   RDW 86.7 88.4 - 84.4 %   Platelets  202 150 - 400 K/uL   nRBC 0.0 0.0 - 0.2 %    Comment: Performed at [facility], [address] [place], [place] [address]  Lactic acid, plasma     Status: Abnormal   Collection Time: 04/16/23  5:28 AM  Result Value Ref Range   Lactic Acid, Venous 2.7 (HH) 0.5 - 1.9 mmol/L    Comment: CRITICAL VALUE NOTED. VALUE IS CONSISTENT WITH PREVIOUSLY REPORTED/CALLED VALUE MW Performed at [facility], [address] [place], [place] [address]   MRSA Next Gen by PCR, Nasal     Status: None   Collection Time: 04/16/23 12:19 PM   Specimen: Nasal Mucosa; Nasal Swab  Result Value Ref Range   MRSA by PCR Next Gen NOT DETECTED NOT DETECTED    Comment: (NOTE) The GeneXpert MRSA Assay (FDA approved for NASAL specimens only), is one component of a comprehensive MRSA colonization surveillance program. It is not intended to diagnose MRSA infection nor to guide or monitor  treatment for MRSA infections. Test performance is not FDA approved in patients less than 6 years old. Performed at [facility], [address] [place], [place] [address]    DG Chest 2 View Result Date: 04/15/2023 CLINICAL DATA:  Cough and shortness of breath. EXAM: CHEST - 2 VIEW COMPARISON:  04/11/2023 FINDINGS: Stable cardiomediastinal contours. Small bilateral pleural effusions. Persistent bilateral lower lobe airspace opacities concerning for pneumonia. Upper lung zones appear clear. Calcified granuloma identified within the left midlung. Spondylosis noted in the thoracic spine. IMPRESSION: 1. Persistent bilateral lower lobe airspace opacities concerning for pneumonia. 2. Small bilateral pleural effusions. Electronically Signed   By: Waddell Calk M.D.   On: 04/15/2023 15:47    Pending Labs Unresulted Labs (From admission, onward)     Start     Ordered   04/17/23 0500  CBC  Tomorrow morning,   R        04/16/23 1514   04/17/23 0500  Basic metabolic panel  Tomorrow morning,   R        04/16/23 1514   04/17/23 0500  Lactic acid, plasma  (Lactic Acid)  Tomorrow morning,   R        04/16/23 1514            Vitals/Pain Today's Vitals   04/16/23 1130 04/16/23 1300 04/16/23 1440 04/16/23 1900  BP: (!) 140/61 117/66  (!) 148/56  Pulse: (!) 56 (!) 50  (!) 44  Resp: 20 18  (!) 21  Temp:   97.6 F (36.4 C)   TempSrc:   Oral   SpO2: 94% 96%    PainSc:        Isolation Precautions No active isolations  Medications Medications  liver oil-zinc  oxide (DESITIN) 40 % ointment ( Topical Given 04/15/23 2129)  amLODipine  (NORVASC ) tablet 10 mg (10 mg Oral Given 04/16/23 1001)  chlorthalidone  (HYGROTON ) tablet 25 mg (has no administration in time range)  pantoprazole  (PROTONIX ) EC tablet 40 mg (40 mg Oral Given 04/16/23 1002)  Rivaroxaban  (XARELTO ) tablet 15 mg (15 mg Oral Given 04/16/23 1631)  calcium -vitamin D  (OSCAL WITH D) 500-5 MG-MCG per tablet 1 tablet (1 tablet  Oral Given 04/16/23 1001)  cholecalciferol  (VITAMIN D3) 25 MCG (1000 UNIT) tablet 1,000 Units (1,000 Units Oral Given 04/16/23 1002)  multivitamin-lutein  (OCUVITE-LUTEIN ) capsule 1 capsule (1 capsule Oral Not Given 04/16/23 1158)  omega-3 acid ethyl esters (LOVAZA ) capsule 1 g (1 g Oral Given 04/16/23 1001)  fluticasone  (FLONASE ) 50 MCG/ACT  nasal spray 1 spray (1 spray Each Nare Given 04/16/23 1053)  lactated ringers  infusion (0 mL/hr Intravenous Stopped 04/16/23 1950)  cefTRIAXone  (ROCEPHIN ) 2 g in sodium chloride  0.9 % 100 mL IVPB (0 g Intravenous Stopped 04/16/23 0707)  azithromycin  (ZITHROMAX ) 500 mg in sodium chloride  0.9 % 250 mL IVPB (0 mg Intravenous Stopped 04/16/23 0206)  acetaminophen  (TYLENOL ) tablet 650 mg (has no administration in time range)    Or  acetaminophen  (TYLENOL ) suppository 650 mg (has no administration in time range)  traZODone  (DESYREL ) tablet 25 mg (has no administration in time range)  magnesium  hydroxide (MILK OF MAGNESIA) suspension 30 mL (has no administration in time range)  ondansetron  (ZOFRAN ) tablet 4 mg (has no administration in time range)    Or  ondansetron  (ZOFRAN ) injection 4 mg (has no administration in time range)  ipratropium-albuterol  (DUONEB) 0.5-2.5 (3) MG/3ML nebulizer solution 3 mL (3 mLs Nebulization Given 04/16/23 1948)  guaiFENesin  (MUCINEX ) 12 hr tablet 600 mg (600 mg Oral Given 04/16/23 1001)  chlorpheniramine-HYDROcodone (TUSSIONEX) 10-8 MG/5ML suspension 5 mL (has no administration in time range)  sodium chloride  0.9 % bolus 1,000 mL (0 mLs Intravenous Stopped 04/15/23 2014)  vancomycin  (VANCOCIN ) IVPB 1000 mg/200 mL premix (0 mg Intravenous Stopped 04/15/23 2111)  ceFEPIme  (MAXIPIME ) 2 g in sodium chloride  0.9 % 100 mL IVPB (0 g Intravenous Stopped 04/15/23 1947)  methylPREDNISolone  sodium succinate (SOLU-MEDROL ) 125 mg/2 mL injection 125 mg (125 mg Intravenous Given 04/15/23 2001)  ipratropium-albuterol  (DUONEB) 0.5-2.5 (3) MG/3ML  nebulizer solution 3 mL (3 mLs Nebulization Given 04/15/23 1957)  ipratropium-albuterol  (DUONEB) 0.5-2.5 (3) MG/3ML nebulizer solution 3 mL (3 mLs Nebulization Given 04/15/23 1957)  sodium chloride  0.9 % bolus 1,000 mL (0 mLs Intravenous Stopped 04/16/23 0012)  sodium chloride  0.9 % bolus 500 mL (0 mLs Intravenous Stopped 04/16/23 0012)  sodium chloride  0.9 % bolus 250 mL (0 mLs Intravenous Stopped 04/15/23 2259)    Mobility walks     Focused Assessments Respiratory Head to Toe Assessment   R Recommendations: See Admitting Provider Note  Report given to:   Additional Notes:

## 2023-04-16 NOTE — Hospital Course (Addendum)
 Dennis Savage is a 89 y.o. male with medical history significant for hypertension, CHF, COPD, GERD, atrial fibrillation, OSA, PVD and osteoarthritis, who was just discharged 3 days ago after being managed for RSV infection and acute respiratory failure with hypoxia.  He had a worsening cough with mostly clear sputum expectoration and dyspnea with associated generalized weakness.   On presentation hemodynamically stable, labs with mild hyponatremia and CO2 of 20, blood glucose of 155, BUN 40, creatinine 1.29, BNP 613 and troponin 142>>131.  Lactic acid 3.4>>3.7, WBC 22.6 with predominant neutrophil. Chest x-ray with persistent bilateral lower lobe airspace opacities concerning for pneumonia and small bilateral pleural effusion. EKG with atrial fibrillation, controlled ventricular response and RBBB.  Patient was given cefepime  and vancomycin  along with DuoNeb and Solu-Medrol .  Received IV normal saline.  Started on Rocephin  and Zithromax . So far patient has completed antibiotics.  Respiratory symptom has improved.  patient developed significant abdominal distention, CT abdomen/pelvis showed ileus.  Received enema 1/7, had 2 large loose stools.  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  Patient developed worsening cough and short of breath at night of 1/8, was started on oral Lasix  for exacerbation congestive heart failure.   Condition finally improved, he slept better.  Renal function slightly worse but stable.  Medically stable for discharge.  While in the hospital, we did notice that the patient probably had intermittent aspiration, seen by speech therapy, believe this is from esophageal phase.  Barium swallow was obtained, did not show significant esophageal stenosis.  Patient will be referred to GI as outpatient.  Because of this, patient will have recurrent aspiration pneumonia.

## 2023-04-16 NOTE — ED Notes (Signed)
 NP Jawo contacted due to lactic of 6.9 from 3.7. New orders placed.

## 2023-04-16 NOTE — ED Notes (Signed)
Messages sent to pharmacy at [phone] and 1013 regarding missing medications not being in ED. Awaiting medications to be sent to ED.

## 2023-04-16 NOTE — ED Notes (Signed)
Patient's home medication taken to pharmacy at this time- Orgovyx 10 tabs.

## 2023-04-16 NOTE — ED Notes (Signed)
Notified PA Jawo of repeat lactic of 4.1. New order placed for repeat lactic at 6am.

## 2023-04-16 NOTE — Progress Notes (Signed)
 Progress Note   Patient: Dennis Savage FMW:[id number] DOB: [birth year] DOA: 04/15/2023     0 DOS: the patient was seen and examined on 04/16/2023   Brief hospital course: Taken from H&P.  QUINTAN SALDIVAR is a 89 y.o. male with medical history significant for hypertension, CHF, COPD, GERD, atrial fibrillation, OSA, PVD and osteoarthritis, who was just discharged 3 days ago after being managed for RSV infection and acute respiratory failure with hypoxia.  Per his family he has not been doing better since he was discharged and continues to have worsening cough with mostly clear sputum expectoration and dyspnea with associated generalized weakness.   On presentation hemodynamically stable, labs with mild hyponatremia and CO2 of 20, blood glucose of 155, BUN 40, creatinine 1.29, BNP 613 and troponin 142>>131.  Lactic acid 3.4>>3.7, WBC 22.6 with predominant neutrophil. Chest x-ray with persistent bilateral lower lobe airspace opacities concerning for pneumonia and small bilateral pleural effusion. EKG with atrial fibrillation, controlled ventricular response and RBBB.  Patient was given cefepime  and vancomycin  along with DuoNeb and Solu-Medrol .  Received IV normal saline.  Started on Rocephin  and Zithromax   12/31: Vitals mostly stable except mild tachypnea.  Lactic acid started improving after peaking at 6.9, currently at 2.7, leukocytosis improving at 13.1, BMP stable, procalcitonin at 0.61, preliminary blood cultures negative.  MRSA PCR negative   Assessment and Plan: * Sepsis due to pneumonia (HCC) - Sepsis manifested by tachypnea of 23 and leukocytosis of 22.6 upon admission.  Also met severe sepsis criteria with lactic acidosis. Improving lactic acidosis and leukocytosis, preliminary blood cultures negative.  Recent RSV infection now likely superadded bacterial pneumonia. - Will continue antibiotic therapy with IV Rocephin  and Zithromax . - Mucolytic therapy be provided as well as duo nebs  q.i.d. and q.4 hours p.r.n. - We will follow blood cultures.   Elevated troponin - This is likely secondary to sepsis.  It has been coming down.  He has no chest pain.   Essential hypertension - We will continue antihypertensive therapy.  Paroxysmal atrial fibrillation (HCC) - We will continue Xarelto .  GERD without esophagitis - We will continue PPI therapy.  Chronic diastolic CHF (congestive heart failure) (HCC) - No clinical exacerbation at this time despite elevated BNP. - We will be cautious with hydration.   Subjective: Patient continued to have some cough and still feeling very weak.  Denies any shortness of breath.  Diarrhea has been resolved.  Physical Exam: Vitals:   04/16/23 1030 04/16/23 1130 04/16/23 1300 04/16/23 1440  BP: 132/60 (!) 140/61 117/66   Pulse: (!) 58 (!) 56 (!) 50   Resp: (!) 22 20 18    Temp:    97.6 F (36.4 C)  TempSrc:    Oral  SpO2: 96% 94% 96%    General.  Frail elderly man, in no acute distress. Pulmonary.  Few scattered rhonchi bilaterally, normal respiratory effort. CV.  Regular rate and rhythm, no JVD, rub or murmur. Abdomen.  Soft, nontender, nondistended, BS positive. CNS.  Alert and oriented .  No focal neurologic deficit. Extremities.  No edema, no cyanosis, pulses intact and symmetrical. Psychiatry.  Judgment and insight appears normal.   Data Reviewed: Prior data reviewed  Family Communication: Unable to reach any family member on phone, voicemail  left with [name], his daughter.  Disposition: Status is: Inpatient Remains inpatient appropriate because: Severity of illness  Planned Discharge Destination: Home with Home Health  DVT prophylaxis.  Xarelto  Time spent: 50 minutes  This record has been  created using Conservation officer, historic buildings. Errors have been sought and corrected,but may not always be located. Such creation errors do not reflect on the standard of care.   Author: Amaryllis Dare, MD 04/16/2023 3:08  PM  For on call review [email or web address].

## 2023-04-16 NOTE — ED Notes (Signed)
 Patient's brief changed at this time and barrier cream applied as requested.

## 2023-04-17 DIAGNOSIS — J189 Pneumonia, unspecified organism: Secondary | ICD-10-CM | POA: Diagnosis not present

## 2023-04-17 DIAGNOSIS — A419 Sepsis, unspecified organism: Secondary | ICD-10-CM | POA: Diagnosis not present

## 2023-04-17 LAB — CBC
HCT: 35.4 % — ABNORMAL LOW (ref 39.0–52.0)
Hemoglobin: 11.6 g/dL — ABNORMAL LOW (ref 13.0–17.0)
MCH: 29.7 pg (ref 26.0–34.0)
MCHC: 32.8 g/dL (ref 30.0–36.0)
MCV: 90.5 fL (ref 80.0–100.0)
Platelets: 191 10*3/uL (ref 150–400)
RBC: 3.91 MIL/uL — ABNORMAL LOW (ref 4.22–5.81)
RDW: 13.3 % (ref 11.5–15.5)
WBC: 12.5 10*3/uL — ABNORMAL HIGH (ref 4.0–10.5)
nRBC: 0 % (ref 0.0–0.2)

## 2023-04-17 LAB — BASIC METABOLIC PANEL
Anion gap: 9 (ref 5–15)
BUN: 35 mg/dL — ABNORMAL HIGH (ref 8–23)
CO2: 22 mmol/L (ref 22–32)
Calcium: 8.3 mg/dL — ABNORMAL LOW (ref 8.9–10.3)
Chloride: 108 mmol/L (ref 98–111)
Creatinine, Ser: 1.28 mg/dL — ABNORMAL HIGH (ref 0.61–1.24)
GFR, Estimated: 53 mL/min — ABNORMAL LOW (ref >60)
Glucose, Bld: 117 mg/dL — ABNORMAL HIGH (ref 70–99)
Potassium: 3.6 mmol/L (ref 3.5–5.1)
Sodium: 139 mmol/L (ref 135–145)

## 2023-04-17 LAB — LACTIC ACID, PLASMA: Lactic Acid, Venous: 1.8 mmol/L (ref 0.5–1.9)

## 2023-04-17 MED ORDER — IPRATROPIUM-ALBUTEROL 0.5-2.5 (3) MG/3ML IN SOLN
3.0000 mL | Freq: Three times a day (TID) | RESPIRATORY_TRACT | Status: DC
  Administered 2023-04-17 – 2023-04-21 (×12): 3 mL via RESPIRATORY_TRACT
  Filled 2023-04-17 (×12): qty 3

## 2023-04-17 MED ORDER — PROSIGHT PO TABS
1.0000 | ORAL_TABLET | Freq: Two times a day (BID) | ORAL | Status: DC
  Administered 2023-04-17 – 2023-04-30 (×23): 1 via ORAL
  Filled 2023-04-17 (×29): qty 1

## 2023-04-17 NOTE — Plan of Care (Signed)

## 2023-04-17 NOTE — Progress Notes (Signed)
  Progress Note   Patient: Dennis Savage FMW:[id number] DOB: January 03, 1930 DOA: 04/15/2023     1 DOS: the patient was seen and examined on 04/17/2023   Brief hospital course: Dennis Savage is a 89 y.o. male with medical history significant for hypertension, CHF, COPD, GERD, atrial fibrillation, OSA, PVD and osteoarthritis, who was just discharged 3 days ago after being managed for RSV infection and acute respiratory failure with hypoxia.  Per his family he has not been doing better since he was discharged and continues to have worsening cough with mostly clear sputum expectoration and dyspnea with associated generalized weakness.    On presentation hemodynamically stable, labs with mild hyponatremia and CO2 of 20, blood glucose of 155, BUN 40, creatinine 1.29, BNP 613 and troponin 142>>131.  Lactic acid 3.4>>3.7, WBC 22.6 with predominant neutrophil. Chest x-ray with persistent bilateral lower lobe airspace opacities concerning for pneumonia and small bilateral pleural effusion. EKG with atrial fibrillation, controlled ventricular response and RBBB.   Patient was given cefepime  and vancomycin  along with DuoNeb and Solu-Medrol .  Received IV normal saline.  Started on Rocephin  and Zithromax    He remains stable ORA and continues on IV Abx. PT/OT recommending SNF. TOC consulted.  Assessment and Plan: Sepsis due to pneumonia [facility])- criteria have resolved.  - Sepsis manifested by tachypnea of 23 and leukocytosis of 22.6 upon admission.  Also met severe sepsis criteria with lactic acidosis.  preliminary blood cultures negative.  Recent RSV infection now likely superadded bacterial pneumonia. Still RSV positive - Will continue antibiotic therapy with IV Rocephin  and Zithromax . - Mucolytic therapy be provided as well as duo nebs q.i.d. and q.4 hours p.r.n. - f/u blood cultures. - PT/OT  Elevated troponin- peaked at 142 - This is likely secondary to sepsis.  He has no chest pain, ischemia on  ECG  Essential hypertension -  continue home antihypertensive therapy.  Paroxysmal atrial fibrillation (HCC) - continue Xarelto .  GERD without esophagitis - continue PPI therapy.  Chronic diastolic CHF (congestive heart failure) (HCC) - No clinical exacerbation at this time despite elevated BNP.  Subjective: Patient endorses no complaints today. Breathing is comfortable at rest.   Physical Exam: Vitals:   04/16/23 2317 04/17/23 0441 04/17/23 0737 04/17/23 0745  BP: (!) 153/61 (!) 155/63  (!) 149/48  Pulse: (!) 44 77  (!) 48  Resp: 18 18  19   Temp: 98 F (36.7 C) 98 F (36.7 C)  98 F (36.7 C)  TempSrc:      SpO2:  95% 94% 100%  Weight: 89.8 kg     Height: 5' 9 (1.753 m)      General.  Frail, elderly, in no acute distress. Pulmonary.  Few scattered rhonchi bilaterally, normal respiratory effort. CV.  Regular rate and rhythm, no JVD, rub or murmur. Abdomen.  Soft, nontender, nondistended, BS positive. CNS.  Alert and oriented .  No focal neurologic deficit. Extremities.  No edema, no cyanosis, pulses intact and symmetrical. Psychiatry.  Judgment and insight appears normal.   Data Reviewed: Prior data reviewed  Family Communication: none at bedside  Disposition: Status is: Inpatient Remains inpatient appropriate because: Severity of illness  Planned Discharge Destination: SNF  DVT prophylaxis.  Xarelto  Time spent: 45 minutes  Author: Marien LITTIE Piety, MD 04/17/2023 7:49 AM  For on call review [email or web address].

## 2023-04-17 NOTE — Evaluation (Signed)
 Occupational Therapy Evaluation Patient Details Name: Dennis Savage MRN: [id number] DOB: 06-Sep-1929 Today's Date: 04/17/2023   History of Present Illness Pt is a 89 yo male that presented from [facility] for SOB, hypoxia, workup for sepsis due to PNA. PMH arthritis, CHF, COPD, gastric reflux, hypertension, dizziness, LBP, and PVD, R and L THA.   Clinical Impression   Chart reviewed, pt is alert and oriented x4, agreeable to OT evaluation. PTA pt was briefly at rehab- prior to previous admission pt was MOD I in ADL, assist for IADL, amb with AD household/community distances. Pt presents with deficits in strength, activity tolerance, balance, endurance affecting safe and optimal ADL completion. Bed mobility completed with MIN A, STS with CGA, MIN A for STS off regular toilet height, Pt amb in room with RW with CGA. Pt mobility is limited by reports of fatigue, requesting to return to chair. LB dressing completed with MOD A, grooming tasks in sitting with SET UP. Pt is not performing ADL at baseline status, would benefit from skilled OT to address deficits and to facilitate optimal ADL completion, return to PLOF. OT will continue to follow acutely.      If plan is discharge home, recommend the following: A little help with walking and/or transfers;A little help with bathing/dressing/bathroom;Assistance with cooking/housework;Assist for transportation    Functional Status Assessment  Patient has had a recent decline in their functional status and demonstrates the ability to make significant improvements in function in a reasonable and predictable amount of time.  Equipment Recommendations  Other (comment) (defer to next venue of care)    Recommendations for Other Services       Precautions / Restrictions Precautions Precautions: Fall Restrictions Weight Bearing Restrictions Per Provider Order: No      Mobility Bed Mobility Overal bed mobility: Needs Assistance Bed Mobility: Supine to Sit      Supine to sit: Min assist, HOB elevated, Used rails          Transfers Overall transfer level: Needs assistance Equipment used: Rolling walker (2 wheels) Transfers: Sit to/from Stand Sit to Stand: Contact guard assist, Min assist                  Balance Overall balance assessment: Needs assistance Sitting-balance support: Feet supported Sitting balance-Leahy Scale: Good     Standing balance support: Bilateral upper extremity supported, Reliant on assistive device for balance, During functional activity Standing balance-Leahy Scale: Fair                             ADL either performed or assessed with clinical judgement   ADL Overall ADL's : Needs assistance/impaired Eating/Feeding: Set up;Sitting   Grooming: Oral care;Set up;Sitting               Lower Body Dressing: Moderate assistance;Sitting/lateral leans Lower Body Dressing Details (indicate cue type and reason): socks Toilet Transfer: Minimal assistance;Rolling walker (2 wheels);Regular Toilet;Cueing for sequencing   Toileting- Clothing Manipulation and Hygiene: Supervision/safety;Sitting/lateral lean       Functional mobility during ADLs: Contact guard assist;Rolling walker (2 wheels) (approx 15' two attempts with RW, intermittent vcs for technique)       Vision Patient Visual Report: No change from baseline       Perception         Praxis         Pertinent Vitals/Pain Pain Assessment Pain Assessment: Faces Faces Pain Scale: Hurts a little bit Pain Location:  endorsed a little bit of chest pain at start of session that resolved Pain Intervention(s): Monitored during session, Limited activity within patient's tolerance     Extremity/Trunk Assessment Upper Extremity Assessment Upper Extremity Assessment: Generalized weakness   Lower Extremity Assessment Lower Extremity Assessment: Generalized weakness   Cervical / Trunk Assessment Cervical / Trunk Assessment: Normal    Communication Communication Cueing Techniques: Verbal cues   Cognition Arousal: Alert Behavior During Therapy: WFL for tasks assessed/performed Overall Cognitive Status: Within Functional Limits for tasks assessed                                       General Comments  HR in 70s bpm during mobility, spo2 >90% on RA throughout    Exercises Other Exercises Other Exercises: edu re: role of OT, role of rehab, discharge recommendations   Shoulder Instructions      Home Living Family/patient expects to be discharged to:: Private residence Living Arrangements: Children (daughter, son in social worker, grandchildren) Available Help at Discharge: Available PRN/intermittently Type of Home: House Home Access: Stairs to enter Secretary/administrator of Steps: 1   Home Layout: One level     Bathroom Shower/Tub: Chief Strategy Officer: Standard     Home Equipment: Rollator (4 wheels);Cane - single point;Wheelchair - manual;Grab bars - tub/shower;Shower seat;Tub bench;Toilet riser          Prior Functioning/Environment Prior Level of Function : Needs assist             Mobility Comments: MOD I with SPC or rollator at baseline ADLs Comments: MOD I-I in ADL, assist with IADLs- driving, shopping, cooking        OT Problem List: Decreased strength;Decreased activity tolerance;Impaired balance (sitting and/or standing);Decreased knowledge of use of DME or AE;Cardiopulmonary status limiting activity      OT Treatment/Interventions: Self-care/ADL training;Therapeutic exercise;Patient/family education;Balance training;Energy conservation;Therapeutic activities;DME and/or AE instruction    OT Goals(Current goals can be found in the care plan section) Acute Rehab OT Goals Patient Stated Goal: rehab OT Goal Formulation: With patient Time For Goal Achievement: 05/01/23 Potential to Achieve Goals: Good ADL Goals Pt Will Perform Grooming: with modified  independence;standing Pt Will Perform Lower Body Dressing: with modified independence;sit to/from stand Pt Will Transfer to Toilet: with modified independence;ambulating Pt Will Perform Toileting - Clothing Manipulation and hygiene: with modified independence;sit to/from stand  OT Frequency: Min 1X/week    Co-evaluation PT/OT/SLP Co-Evaluation/Treatment: Yes Reason for Co-Treatment: For patient/therapist safety;To address functional/ADL transfers PT goals addressed during session: Mobility/safety with mobility OT goals addressed during session: ADL's and self-care;Proper use of Adaptive equipment and DME      AM-PAC OT 6 Clicks Daily Activity     Outcome Measure Help from another person eating meals?: None Help from another person taking care of personal grooming?: A Little Help from another person toileting, which includes using toliet, bedpan, or urinal?: A Lot Help from another person bathing (including washing, rinsing, drying)?: A Lot Help from another person to put on and taking off regular upper body clothing?: A Little Help from another person to put on and taking off regular lower body clothing?: A Lot 6 Click Score: 16   End of Session Equipment Utilized During Treatment: Rolling walker (2 wheels);Gait belt Nurse Communication: Mobility status (nurse in room pre mobility due to pt c/o pain- resolved with mobility)  Activity Tolerance: Patient tolerated treatment well Patient left:  in chair;with call bell/phone within reach;with chair alarm set  OT Visit Diagnosis: Unsteadiness on feet (R26.81);Muscle weakness (generalized) (M62.81)                Time: 8943-8877 OT Time Calculation (min): 26 min Charges:  OT General Charges $OT Visit: 1 Visit OT Evaluation $OT Eval Moderate Complexity: 1 Mod  Therisa Sheffield, OTD OTR/L  04/17/23, 1:11 PM

## 2023-04-17 NOTE — Evaluation (Signed)
 Physical Therapy Evaluation Patient Details Name: Dennis Savage MRN: [id number] DOB: 05-Oct-1929 Today's Date: 04/17/2023  History of Present Illness  Pt is a 89 yo male that presented from [facility] for SOB, hypoxia, workup for sepsis due to PNA. PMH arthritis, CHF, COPD, gastric reflux, hypertension, dizziness, LBP, and PVD, R and L THA.   Clinical Impression  Pt alert, oriented x4. Did report chest pain upon arrival RN in room to assess pt and pt reported resolvement of symptoms during session, HR 60s-80s with mobility, BP was elevated and RN notified. At baseline the pt lives with family but does not need assistance. Supine to sit with minA, CGA from EOB with RW, but minA from standard commode despite use of grab bar. He was able to ambulate ~65ft with RW and CGA as well. Endorsed some fatigue and shakiness compared to baseline.  Overall the patient demonstrated deficits (see PT Problem List) that impede the patient's functional abilities, safety, and mobility and would benefit from skilled PT intervention.           If plan is discharge home, recommend the following: Assistance with cooking/housework;Direct supervision/assist for medications management;Assist for transportation;Help with stairs or ramp for entrance;A little help with walking and/or transfers;A little help with bathing/dressing/bathroom   Can travel by private vehicle   No    Equipment Recommendations Other (comment) (TBD)  Recommendations for Other Services       Functional Status Assessment Patient has had a recent decline in their functional status and demonstrates the ability to make significant improvements in function in a reasonable and predictable amount of time.     Precautions / Restrictions Precautions Precautions: Fall Restrictions Weight Bearing Restrictions Per Provider Order: No      Mobility  Bed Mobility Overal bed mobility: Needs Assistance Bed Mobility: Supine to Sit     Supine to sit: Min  assist, HOB elevated, Used rails          Transfers Overall transfer level: Needs assistance Equipment used: Rolling walker (2 wheels) Transfers: Sit to/from Stand Sit to Stand: Contact guard assist, Min assist           General transfer comment: minA from commode    Ambulation/Gait Ambulation/Gait assistance: Contact guard assist Gait Distance (Feet): 35 Feet Assistive device: Rolling walker (2 wheels) Gait Pattern/deviations: Step-through pattern          Stairs            Wheelchair Mobility     Tilt Bed    Modified Rankin (Stroke Patients Only)       Balance Overall balance assessment: Needs assistance Sitting-balance support: Feet supported Sitting balance-Leahy Scale: Good     Standing balance support: Bilateral upper extremity supported, Reliant on assistive device for balance, During functional activity Standing balance-Leahy Scale: Fair                               Pertinent Vitals/Pain Pain Assessment Pain Assessment: Faces Faces Pain Scale: Hurts a little bit Pain Location: endorsed a little bit of chest pain at start of session that resolved Pain Intervention(s): Limited activity within patient's tolerance, Monitored during session, Repositioned    Home Living Family/patient expects to be discharged to:: Private residence Living Arrangements: Children (daughter and SIL, two grandchildren) Available Help at Discharge: Available PRN/intermittently Type of Home: House     Entrance Stairs-Number of Steps: 1   Home Layout: One level Home Equipment: Rollator (  4 wheels);Cane - single point;Wheelchair - manual;Grab bars - tub/shower;Shower seat;Tub bench;Toilet riser      Prior Function Prior Level of Function : Needs assist             Mobility Comments: Mod Ind amb with a SPC or rollator at baseline ADLs Comments: Pt IND in BADL. Lives with daughter and SIL, they assist with IADL such as driving, shopping,  cooking     Extremity/Trunk Assessment   Upper Extremity Assessment Upper Extremity Assessment: Defer to OT evaluation    Lower Extremity Assessment Lower Extremity Assessment: Generalized weakness (able to lift BLE off bed)    Cervical / Trunk Assessment Cervical / Trunk Assessment: Normal  Communication      Cognition Arousal: Alert Behavior During Therapy: WFL for tasks assessed/performed Overall Cognitive Status: Within Functional Limits for tasks assessed                                          General Comments      Exercises     Assessment/Plan    PT Assessment Patient needs continued PT services  PT Problem List Decreased strength;Decreased activity tolerance;Decreased balance;Decreased mobility;Decreased knowledge of use of DME       PT Treatment Interventions DME instruction;Gait training;Stair training;Functional mobility training;Therapeutic activities;Therapeutic exercise;Balance training;Patient/family education    PT Goals (Current goals can be found in the Care Plan section)  Acute Rehab PT Goals Patient Stated Goal: to get stronger PT Goal Formulation: With patient Time For Goal Achievement: 05/01/23 Potential to Achieve Goals: Good    Frequency Min 1X/week     Co-evaluation PT/OT/SLP Co-Evaluation/Treatment: Yes Reason for Co-Treatment: For patient/therapist safety;To address functional/ADL transfers PT goals addressed during session: Mobility/safety with mobility OT goals addressed during session: ADL's and self-care;Proper use of Adaptive equipment and DME       AM-PAC PT 6 Clicks Mobility  Outcome Measure Help needed turning from your back to your side while in a flat bed without using bedrails?: A Little Help needed moving from lying on your back to sitting on the side of a flat bed without using bedrails?: A Little Help needed moving to and from a bed to a chair (including a wheelchair)?: A Little Help needed  standing up from a chair using your arms (e.g., wheelchair or bedside chair)?: A Little Help needed to walk in hospital room?: A Little Help needed climbing 3-5 steps with a railing? : A Little 6 Click Score: 18    End of Session Equipment Utilized During Treatment: Gait belt Activity Tolerance: Patient tolerated treatment well Patient left: in chair;with call bell/phone within reach;with chair alarm set Nurse Communication: Mobility status PT Visit Diagnosis: Difficulty in walking, not elsewhere classified (R26.2);Muscle weakness (generalized) (M62.81)    Time: 8941-8876 PT Time Calculation (min) (ACUTE ONLY): 25 min   Charges:   PT Evaluation $PT Eval Low Complexity: 1 Low PT Treatments $Therapeutic Activity: 8-22 mins PT General Charges $$ ACUTE PT VISIT: 1 Visit       Doyal Shams PT, DPT 12:30 PM,04/17/23

## 2023-04-18 DIAGNOSIS — J189 Pneumonia, unspecified organism: Secondary | ICD-10-CM | POA: Diagnosis not present

## 2023-04-18 DIAGNOSIS — A419 Sepsis, unspecified organism: Secondary | ICD-10-CM | POA: Diagnosis not present

## 2023-04-18 MED ORDER — PSEUDOEPHEDRINE HCL 30 MG PO TABS
30.0000 mg | ORAL_TABLET | Freq: Two times a day (BID) | ORAL | Status: AC
  Administered 2023-04-18 – 2023-04-19 (×4): 30 mg via ORAL
  Filled 2023-04-18 (×4): qty 1

## 2023-04-18 MED ORDER — BUDESONIDE 0.25 MG/2ML IN SUSP
0.2500 mg | Freq: Two times a day (BID) | RESPIRATORY_TRACT | Status: DC
  Administered 2023-04-18 – 2023-04-30 (×23): 0.25 mg via RESPIRATORY_TRACT
  Filled 2023-04-18 (×24): qty 2

## 2023-04-18 NOTE — [place] FL2 (Signed)
 [place]  MEDICAID FL2 LEVEL OF CARE FORM     IDENTIFICATION  Patient Name: Dennis Savage Birthdate: [birth year] Sex: male Admission Date (Current Location): 04/15/2023  [facility] and [place] Number:  Chiropodist and Address:         Provider Number: [id number]  Attending Physician Name and Address:  Lenon Marien CROME, MD  Relative Name and Phone Number:       Current Level of Care: Hospital Recommended Level of Care: Skilled Nursing Facility Prior Approval Number:    Date Approved/Denied:   PASRR Number: [id number]  Discharge Plan: SNF    Current Diagnoses: Patient Active Problem List   Diagnosis Date Noted   CKD stage 3a, GFR 45-59 ml/min (HCC) 04/08/2023   RSV (respiratory syncytial virus pneumonia) 04/05/2023   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   Exposure to potentially hazardous substance 01/13/2023   Sepsis due to pneumonia (HCC) 10/28/2022   Essential hypertension 10/28/2022   Paroxysmal atrial fibrillation (HCC) 10/28/2022   GERD without esophagitis 10/28/2022   Chronic diastolic CHF (congestive heart failure) (HCC) 10/28/2022   Acute respiratory failure with hypoxia (HCC) 10/28/2022   Generalized weakness 10/28/2022   Obesity (BMI 30-39.9) 06/21/2021   Pleural effusion on left 06/19/2021   CAP (community acquired pneumonia) 06/18/2021   Acute on chronic diastolic CHF (congestive heart failure) (HCC) 06/18/2021   Adrenal mass greater than 4 cm in diameter (HCC) 06/18/2021   Elevated troponin 06/18/2021   CHF (congestive heart failure) (HCC) 06/17/2021   Acute kidney injury superimposed on CKD (HCC) 06/17/2021   Acute CHF (congestive heart failure) (HCC) 06/17/2021   S/P total hip arthroplasty 10/12/2015   Degenerative arthritis of hip 09/12/2015   Atrial fibrillation (HCC) 07/13/2015   Apnea, sleep 07/13/2015   Diarrhea 02/25/2015   Dizziness 02/25/2015   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, 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    Retinal hemorrhage 03/16/2014   Interstitial lung disease (HCC) 10/13/2013   After cataract not obscuring vision 12/21/2011   History of surgical procedure 12/21/2011   Constipation 06/15/2011   Encounter for general adult medical examination without abnormal findings 06/15/2011   Arthralgia of multiple joints 06/15/2011   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   Artificial lens present 12/08/2010   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   Benign essential HTN 11/03/2010   COPD with acute exacerbation (HCC) 11/03/2010   Benign hypertension 11/03/2010   Low back pain 11/03/2010   Peripheral vascular disease (HCC) 11/03/2010   Current tobacco use 11/03/2010    Orientation RESPIRATION BLADDER Height & Weight     Self, Time, Situation, Place  Normal Continent Weight: 89.8 kg Height:  5' 9 (175.3 cm)  BEHAVIORAL SYMPTOMS/MOOD NEUROLOGICAL BOWEL NUTRITION STATUS      Continent Diet (Heart Healthy)  AMBULATORY STATUS COMMUNICATION OF NEEDS Skin   Limited Assist Verbally Bruising                       Personal Care Assistance Level of Assistance              Functional Limitations Info             SPECIAL CARE FACTORS FREQUENCY  PT (By licensed PT), OT (By licensed OT)                    Contractures Contractures Info: Not present    Additional Factors Info  Code Status, Allergies Code Status Info: full Allergies Info: lisinopril, penicillin  Current Medications (04/18/2023):  This is the current hospital active medication list Current Facility-Administered Medications  Medication Dose Route Frequency Provider Last Rate Last Admin   acetaminophen  (TYLENOL ) tablet 650 mg  650 mg Oral Q6H PRN Mansy, Jan A, MD   650 mg at 04/17/23 1500   Or   acetaminophen  (TYLENOL ) suppository 650 mg  650 mg Rectal Q6H PRN Mansy, Jan A, MD       amLODipine  (NORVASC ) tablet 10 mg  10 mg Oral Daily Mansy, Jan A, MD   10 mg at 04/18/23 1022   azithromycin  (ZITHROMAX ) 500 mg  in sodium chloride  0.9 % 250 mL IVPB  500 mg Intravenous Q24H Mansy, Jan A, MD 250 mL/hr at 04/17/23 2147 500 mg at 04/17/23 2147   budesonide  (PULMICORT ) nebulizer solution 0.25 mg  0.25 mg Nebulization BID Lenon Marien CROME, MD   0.25 mg at 04/18/23 9040   calcium -vitamin D  (OSCAL WITH D) 500-5 MG-MCG per tablet 1 tablet  1 tablet Oral Daily Mansy, Jan A, MD   1 tablet at 04/18/23 1022   cefTRIAXone  (ROCEPHIN ) 2 g in sodium chloride  0.9 % 100 mL IVPB  2 g Intravenous Q24H Mansy, Jan A, MD 200 mL/hr at 04/18/23 0758 2 g at 04/18/23 9241   chlorthalidone  (HYGROTON ) tablet 25 mg  25 mg Oral Once per day on Monday Thursday Mansy, Jan A, MD   25 mg at 04/18/23 1023   cholecalciferol  (VITAMIN D3) 25 MCG (1000 UNIT) tablet 1,000 Units  1,000 Units Oral Daily Mansy, Jan A, MD   1,000 Units at 04/18/23 1022   fluticasone  (FLONASE ) 50 MCG/ACT nasal spray 1 spray  1 spray Each Nare BID Mansy, Jan A, MD   1 spray at 04/18/23 1022   guaiFENesin  (MUCINEX ) 12 hr tablet 600 mg  600 mg Oral BID Mansy, Jan A, MD   600 mg at 04/18/23 1022   ipratropium-albuterol  (DUONEB) 0.5-2.5 (3) MG/3ML nebulizer solution 3 mL  3 mL Nebulization TID Lenon Marien CROME, MD   3 mL at 04/18/23 1324   liver oil-zinc  oxide (DESITIN) 40 % ointment   Topical PRN Angelena Smalls, MD   Given at 04/15/23 2129   magnesium  hydroxide (MILK OF MAGNESIA) suspension 30 mL  30 mL Oral Daily PRN Mansy, Jan A, MD       multivitamin (PROSIGHT) tablet 1 tablet  1 tablet Oral BID Lenon Marien CROME, MD   1 tablet at 04/18/23 1023   omega-3 acid ethyl esters (LOVAZA ) capsule 1 g  1 g Oral Daily Mansy, Jan A, MD   1 g at 04/18/23 1022   ondansetron  (ZOFRAN ) tablet 4 mg  4 mg Oral Q6H PRN Mansy, Jan A, MD       Or   ondansetron  (ZOFRAN ) injection 4 mg  4 mg Intravenous Q6H PRN Mansy, Jan A, MD       pantoprazole  (PROTONIX ) EC tablet 40 mg  40 mg Oral Daily Mansy, Jan A, MD   40 mg at 04/18/23 1022   pseudoephedrine  (SUDAFED) tablet 30 mg  30 mg Oral  BID Lenon Marien CROME, MD   30 mg at 04/18/23 1023   Rivaroxaban  (XARELTO ) tablet 15 mg  15 mg Oral Q supper Mansy, Jan A, MD   15 mg at 04/17/23 1653   traZODone  (DESYREL ) tablet 25 mg  25 mg Oral QHS PRN Mansy, Madison LABOR, MD         Discharge Medications: Please see discharge summary for a list of discharge medications.  Relevant Imaging Results:  Relevant Lab Results:   Additional Information SS #: [id number]  Corean ONEIDA Haddock, RN

## 2023-04-18 NOTE — Progress Notes (Signed)
  Progress Note Patient: Dennis Savage FMW:[id number] DOB: [birth year] DOA: 04/15/2023     2 DOS: the patient was seen and examined on 04/18/2023   Brief hospital course: Dennis Savage is a 89 y.o. male with medical history significant for hypertension, CHF, COPD, GERD, atrial fibrillation, OSA, PVD and osteoarthritis, who was just discharged 3 days ago after being managed for RSV infection and acute respiratory failure with hypoxia.  Per his family he has not been doing better since he was discharged and continues to have worsening cough with mostly clear sputum expectoration and dyspnea with associated generalized weakness.    On presentation hemodynamically stable, labs with mild hyponatremia and CO2 of 20, blood glucose of 155, BUN 40, creatinine 1.29, BNP 613 and troponin 142>>131.  Lactic acid 3.4>>3.7, WBC 22.6 with predominant neutrophil. Chest x-ray with persistent bilateral lower lobe airspace opacities concerning for pneumonia and small bilateral pleural effusion. EKG with atrial fibrillation, controlled ventricular response and RBBB.   Patient was given cefepime  and vancomycin  along with DuoNeb and Solu-Medrol .  Received IV normal saline.  Started on Rocephin  and Zithromax    He remains stable ORA and continues on IV Abx. PT/OT recommending SNF. TOC consulted.  Assessment and Plan: Sepsis due to pneumonia [facility])- criteria have resolved.  - Sepsis manifested by tachypnea of 23 and leukocytosis of 22.6 upon admission.  Also met severe sepsis criteria with lactic acidosis.  preliminary blood cultures negative.  Recent RSV infection now likely superadded bacterial pneumonia. Still RSV positive - Will continue antibiotic therapy with IV Rocephin  and Zithromax . - Mucolytic therapy be provided as well as duo nebs q.i.d. and q.4 hours p.r.n. - f/u blood cultures. - PT/OT - breathing treatments  Elevated troponin- peaked at 142 - This is likely secondary to sepsis.  He has no chest pain,  ischemia on ECG  Essential hypertension -  continue home antihypertensive therapy.  Paroxysmal atrial fibrillation (HCC) - continue Xarelto .  GERD without esophagitis - continue PPI therapy.  Chronic diastolic CHF (congestive heart failure) (HCC) - No clinical exacerbation at this time despite elevated BNP.  Subjective: Patient endorses no complaints today. States that his breathing is improved. Becomes agitated when discussing SNF discharge.   Physical Exam: Vitals:   04/17/23 0745 04/17/23 1200 04/18/23 0232 04/18/23 0417  BP: (!) 149/48  (!) 160/65 (!) 158/77  Pulse: (!) 48  (!) 43 (!) 53  Resp: 19  20 20   Temp: 98 F (36.7 C)  97.9 F (36.6 C) (!) 97.5 F (36.4 C)  TempSrc:   Oral Oral  SpO2: 100% 93% 92% 94%  Weight:      Height:       General.  Frail, elderly, in no acute distress. Pulmonary.  Few scattered rhonchi bilaterally, normal respiratory effort. CV.  Regular rate and rhythm, no JVD, rub or murmur. Abdomen.  Soft, nontender, nondistended, BS positive. CNS.  Alert and oriented .  No focal neurologic deficit. Extremities.  No edema, no cyanosis, pulses intact and symmetrical. Psychiatry. Agitated   Data Reviewed: Prior data reviewed  Family Communication: none at bedside  Disposition: Status is: Inpatient Remains inpatient appropriate because: Severity of illness  Planned Discharge Destination: SNF  DVT prophylaxis.  Xarelto  Time spent: 38 minutes  Author: Marien LITTIE Piety, MD 04/18/2023 7:30 AM  For on call review [email or web address].

## 2023-04-18 NOTE — TOC Initial Note (Signed)
 Transition of Care [facility]) - Initial/Assessment Note    Patient Details  Name: Dennis Savage MRN: [id number] Date of Birth: 14-Apr-1930  Transition of Care [facility]) CM/SW Contact:    Corean ONEIDA Haddock, RN Phone Number: 04/18/2023, 1:46 PM  Clinical Narrative:                  Patient admitted from [facility] Was recently discharged there for STR Per Therapy patient is agreeable to continue STR but does not want to return to [facility] Fl2 sent for signature Bed Search initiated        Patient Goals and CMS Choice            Expected Discharge Plan and Services                                              Prior Living Arrangements/Services                       Activities of Daily Living   ADL Screening (condition at time of admission) Independently performs ADLs?: Yes (appropriate for developmental age) Is the patient deaf or have difficulty hearing?: Yes Does the patient have difficulty seeing, even when wearing glasses/contacts?: No Does the patient have difficulty concentrating, remembering, or making decisions?: No  Permission Sought/Granted                  Emotional Assessment              Admission diagnosis:  HCAP (healthcare-associated pneumonia) [J18.9] Sepsis due to pneumonia (HCC) [J18.9, A41.9] Sepsis without acute organ dysfunction, due to unspecified organism [facility]) [A41.9] Patient Active Problem List   Diagnosis Date Noted   CKD stage 3a, GFR 45-59 ml/min (HCC) 04/08/2023   RSV (respiratory syncytial virus pneumonia) 04/05/2023   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   Exposure to potentially hazardous substance 01/13/2023   Sepsis due to pneumonia (HCC) 10/28/2022   Essential hypertension 10/28/2022   Paroxysmal atrial fibrillation (HCC) 10/28/2022   GERD without esophagitis 10/28/2022   Chronic diastolic CHF (congestive heart failure) (HCC) 10/28/2022   Acute respiratory failure with hypoxia (HCC) 10/28/2022   Generalized  weakness 10/28/2022   Obesity (BMI 30-39.9) 06/21/2021   Pleural effusion on left 06/19/2021   CAP (community acquired pneumonia) 06/18/2021   Acute on chronic diastolic CHF (congestive heart failure) (HCC) 06/18/2021   Adrenal mass greater than 4 cm in diameter (HCC) 06/18/2021   Elevated troponin 06/18/2021   CHF (congestive heart failure) (HCC) 06/17/2021   Acute kidney injury superimposed on CKD (HCC) 06/17/2021   Acute CHF (congestive heart failure) (HCC) 06/17/2021   S/P total hip arthroplasty 10/12/2015   Degenerative arthritis of hip 09/12/2015   Atrial fibrillation (HCC) 07/13/2015   Apnea, sleep 07/13/2015   Diarrhea 02/25/2015   Dizziness 02/25/2015   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, 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   Retinal hemorrhage 03/16/2014   Interstitial lung disease (HCC) 10/13/2013   After cataract not obscuring vision 12/21/2011   History of surgical procedure 12/21/2011   Constipation 06/15/2011   Encounter for general adult medical examination without abnormal findings 06/15/2011   Arthralgia of multiple joints 06/15/2011   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   Artificial lens present 12/08/2010   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   Benign essential HTN 11/03/2010   COPD with acute exacerbation (HCC) 11/03/2010  Benign hypertension 11/03/2010   Low back pain 11/03/2010   Peripheral vascular disease (HCC) 11/03/2010   Current tobacco use 11/03/2010   PCP:  Rudolpho Norleen BIRCH, MD Pharmacy:   [facility] [address] ([place]), [place] - [address] [place] ([place]) [place] [address] Phone: [phone] Fax: [phone]  [facility] - [place], [place] - [address] [place] [address] Phone: [phone] Fax: [phone]  [facility] [place], [place] - [address] [phone] [facility] [address] [place] [address] Phone:  [phone] Fax: [phone]     Social Drivers of Health (SDOH) Social History: SDOH Screenings   Food Insecurity: No Food Insecurity (04/16/2023)  Housing: Low Risk  (04/16/2023)  Transportation Needs: No Transportation Needs (04/16/2023)  Utilities: Not At Risk (04/16/2023)  Depression (PHQ2-9): Low Risk  (06/30/2021)  Financial Resource Strain: Low Risk  (03/29/2023)   Received from [facility]  Social Connections: Moderately Isolated (04/16/2023)  Tobacco Use: Medium Risk (04/05/2023)   SDOH Interventions:     Readmission Risk Interventions     No data to display

## 2023-04-19 DIAGNOSIS — J189 Pneumonia, unspecified organism: Secondary | ICD-10-CM | POA: Diagnosis not present

## 2023-04-19 DIAGNOSIS — A419 Sepsis, unspecified organism: Secondary | ICD-10-CM | POA: Diagnosis not present

## 2023-04-19 NOTE — Progress Notes (Signed)
 Mobility Specialist - Progress Note  Post-mobility: SPO2(97)     04/19/23 0851  Mobility  Activity Ambulated with assistance in hallway  Level of Assistance Contact guard assist, steadying assist  Assistive Device Front wheel walker  Distance Ambulated (ft) 60 ft  Range of Motion/Exercises Active  Activity Response Tolerated well  Mobility Referral Yes  Mobility visit 1 Mobility  Mobility Specialist Start Time (ACUTE ONLY) [id number]  Mobility Specialist Stop Time (ACUTE ONLY) [id number]  Mobility Specialist Time Calculation (min) (ACUTE ONLY) 13 min   Pt resting EOB upon entry on RA. Pt STS and ambulates to hallway beside NS CGA with RW to prevent buckling (for safety). Pt endorses feeling weak knees at 30 ft. Pt returned to bed and left with needs in reach. Pt SOB and slight wheeze (RN notified) spO2 (97) Bed alarm activated.   Guido Rumble Mobility Specialist 04/19/23, 9:08 AM

## 2023-04-19 NOTE — Care Management Important Message (Signed)
 Important Message  Patient Details  Name: WRIGHT GRAVELY MRN: [id number] Date of Birth: 04/30/1929   Important Message Given:  Yes - Medicare IM     Sherilyn Banker 04/19/2023, 12:10 PM

## 2023-04-19 NOTE — Progress Notes (Signed)
  Progress Note Patient: Dennis Savage FMW:[id number] DOB: 08/16/1929 DOA: 04/15/2023     3 DOS: the patient was seen and examined on 04/19/2023   Brief hospital course: Dennis Savage is a 89 y.o. male with medical history significant for hypertension, CHF, COPD, GERD, atrial fibrillation, OSA, PVD and osteoarthritis, who was just discharged 3 days ago after being managed for RSV infection and acute respiratory failure with hypoxia.  Per his family he has not been doing better since he was discharged and continues to have worsening cough with mostly clear sputum expectoration and dyspnea with associated generalized weakness.    On presentation hemodynamically stable, labs with mild hyponatremia and CO2 of 20, blood glucose of 155, BUN 40, creatinine 1.29, BNP 613 and troponin 142>>131.  Lactic acid 3.4>>3.7, WBC 22.6 with predominant neutrophil. Chest x-ray with persistent bilateral lower lobe airspace opacities concerning for pneumonia and small bilateral pleural effusion. EKG with atrial fibrillation, controlled ventricular response and RBBB.   Patient was given cefepime  and vancomycin  along with DuoNeb and Solu-Medrol .  Received IV normal saline.  Started on Rocephin  and Zithromax    He remains stable ORA and continues on IV Abx. PT/OT recommending SNF. TOC consulted.  Assessment and Plan: Sepsis due to pneumonia [facility])- criteria have resolved.  - Sepsis manifested by tachypnea of 23 and leukocytosis of 22.6, lactic acidosis.  preliminary blood cultures negative.  Recent RSV infection now likely superadded bacterial pneumonia. Still RSV positive - Will continue antibiotic therapy with IV Rocephin  and Zithromax  for 5 day treatment. - Mucolytic therapy be provided as well as duo nebs q.i.d. and q.4 hours p.r.n. - f/u blood cultures- NGTD - PT/OT - breathing treatments  Elevated troponin- peaked at 142 - This is likely secondary to sepsis.  He has no chest pain, ischemia on ECG  Essential  hypertension -  continue home antihypertensive therapy.  Paroxysmal atrial fibrillation (HCC) - continue Xarelto .  GERD without esophagitis - continue PPI therapy.  Chronic diastolic CHF (congestive heart failure) (HCC) - No clinical exacerbation at this time despite elevated BNP.  Subjective: Patient endorses no complaints today. Continues to have improved breathing. Slightly short of breath after ambulating in the hallway  Physical Exam: Vitals:   04/18/23 1523 04/18/23 1943 04/18/23 2025 04/19/23 0311  BP: (!) 136/59 (!) 150/61  (!) 149/70  Pulse: (!) 55 (!) 59  (!) 58  Resp: 18 18  18   Temp: 98 F (36.7 C) 97.8 F (36.6 C)  98.3 F (36.8 C)  TempSrc:  Oral  Oral  SpO2: 93% 96% 94% 92%  Weight:      Height:       General.  Frail, elderly, in no acute distress. Pulmonary.  CTAB, normal respiratory effort. CV.  Regular rate and rhythm, no JVD, rub or murmur. Abdomen.  Soft, nontender, nondistended, BS positive. CNS.  Alert and oriented .  No focal neurologic deficit. Extremities.  No edema, no cyanosis, pulses intact and symmetrical. Psychiatry. Normal mood  Data Reviewed: Prior data reviewed  Family Communication: none at bedside  Disposition: Status is: Inpatient Remains inpatient appropriate because: Severity of illness  Planned Discharge Destination: SNF  DVT prophylaxis.  Xarelto  Time spent: 38 minutes  Author: Marien LITTIE Piety, MD 04/19/2023 7:36 AM  For on call review [email or web address].

## 2023-04-19 NOTE — Progress Notes (Signed)
 Physical Therapy Treatment Patient Details Name: Dennis Savage MRN: [id number] DOB: [birth year] Today's Date: 04/19/2023   History of Present Illness Pt is a 89 yo male that presented from [facility] for SOB, hypoxia, workup for sepsis due to PNA. PMH arthritis, CHF, COPD, gastric reflux, hypertension, dizziness, LBP, and PVD, R and L THA.    PT Comments  Patient alert, agreeable to PT, denied pain during session. spO2 90% or greater with all mobility on room air. Pt demonstrated good progress towards goals today and improved endurance. Pt previously independent at baseline and still requiring RW, CGA-supervision for mobility, would benefit from skilled PT services to continue to progress towards goals and return to PLOF.     If plan is discharge home, recommend the following: Assistance with cooking/housework;Direct supervision/assist for medications management;Assist for transportation;Help with stairs or ramp for entrance;A little help with walking and/or transfers;A little help with bathing/dressing/bathroom   Can travel by private vehicle     No  Equipment Recommendations  Other (comment) (TBD)    Recommendations for Other Services       Precautions / Restrictions Precautions Precautions: Fall Restrictions Weight Bearing Restrictions Per Provider Order: No Other Position/Activity Restrictions: watch O2 sats     Mobility  Bed Mobility Overal bed mobility: Needs Assistance Bed Mobility: Supine to Sit     Supine to sit: Supervision, Used rails, HOB elevated          Transfers Overall transfer level: Needs assistance Equipment used: Rolling walker (2 wheels) Transfers: Sit to/from Stand Sit to Stand: Supervision, Contact guard assist           General transfer comment: supervision for EOB, CGA from commode with BSC over standard commode    Ambulation/Gait   Gait Distance (Feet): 120 Feet Assistive device: Rolling walker (2 wheels) Gait Pattern/deviations:  Step-through pattern       General Gait Details: spO2 92> throughout session on room air. improved tolerance compared to last session   Stairs             Wheelchair Mobility     Tilt Bed    Modified Rankin (Stroke Patients Only)       Balance Overall balance assessment: Needs assistance Sitting-balance support: Feet supported Sitting balance-Leahy Scale: Good     Standing balance support: Bilateral upper extremity supported, Reliant on assistive device for balance, During functional activity Standing balance-Leahy Scale: Fair                              Cognition Arousal: Alert Behavior During Therapy: WFL for tasks assessed/performed Overall Cognitive Status: Within Functional Limits for tasks assessed                                          Exercises      General Comments        Pertinent Vitals/Pain Pain Assessment Pain Assessment: No/denies pain    Home Living                          Prior Function            PT Goals (current goals can now be found in the care plan section) Progress towards PT goals: Progressing toward goals    Frequency    Min 1X/week  PT Plan      Co-evaluation              AM-PAC PT 6 Clicks Mobility   Outcome Measure  Help needed turning from your back to your side while in a flat bed without using bedrails?: A Little Help needed moving from lying on your back to sitting on the side of a flat bed without using bedrails?: A Little Help needed moving to and from a bed to a chair (including a wheelchair)?: A Little Help needed standing up from a chair using your arms (e.g., wheelchair or bedside chair)?: A Little Help needed to walk in hospital room?: A Little Help needed climbing 3-5 steps with a railing? : A Little 6 Click Score: 18    End of Session Equipment Utilized During Treatment: Gait belt Activity Tolerance: Patient tolerated treatment  well Patient left: in chair;with call bell/phone within reach;with chair alarm set Nurse Communication: Mobility status PT Visit Diagnosis: Difficulty in walking, not elsewhere classified (R26.2);Muscle weakness (generalized) (M62.81)     Time: 8971-8960 PT Time Calculation (min) (ACUTE ONLY): 11 min  Charges:    $Therapeutic Activity: 8-22 mins PT General Charges $$ ACUTE PT VISIT: 1 Visit                     Doyal Shams PT, DPT 11:01 AM,04/19/23

## 2023-04-19 NOTE — TOC Progression Note (Signed)
 Transition of Care [facility]) - Progression Note    Patient Details  Name: Dennis Savage MRN: [id number] Date of Birth: 26-Oct-1929  Transition of Care [facility]) CM/SW Contact  Lauraine JAYSON Carpen, LCSW Phone Number: 04/19/2023, 4:12 PM  Clinical Narrative:  CSW met with patient and son. Provided bed offers. Patient will review with his daughter.   Expected Discharge Plan and Services                                               Social Determinants of Health (SDOH) Interventions SDOH Screenings   Food Insecurity: No Food Insecurity (04/16/2023)  Housing: Low Risk  (04/16/2023)  Transportation Needs: No Transportation Needs (04/16/2023)  Utilities: Not At Risk (04/16/2023)  Depression (PHQ2-9): Low Risk  (06/30/2021)  Financial Resource Strain: Low Risk  (03/29/2023)   Received from [facility]  Social Connections: Moderately Isolated (04/16/2023)  Tobacco Use: Medium Risk (04/05/2023)    Readmission Risk Interventions    04/18/2023    1:48 PM  Readmission Risk Prevention Plan  Transportation Screening Complete  Medication Review (RN Care Manager) Complete  HRI or Home Care Consult Complete  Palliative Care Screening Not Applicable  Skilled Nursing Facility Complete

## 2023-04-19 NOTE — Plan of Care (Signed)

## 2023-04-20 DIAGNOSIS — A419 Sepsis, unspecified organism: Secondary | ICD-10-CM | POA: Diagnosis not present

## 2023-04-20 DIAGNOSIS — J189 Pneumonia, unspecified organism: Secondary | ICD-10-CM | POA: Diagnosis not present

## 2023-04-20 LAB — CULTURE, BLOOD (ROUTINE X 2)
Culture: NO GROWTH
Culture: NO GROWTH
Special Requests: ADEQUATE

## 2023-04-20 NOTE — Plan of Care (Signed)

## 2023-04-20 NOTE — Progress Notes (Signed)
  Progress Note Patient: Dennis Savage FMW:[id number] DOB: [birth year] DOA: 04/15/2023     4 DOS: the patient was seen and examined on 04/20/2023   Brief hospital course: Dennis Savage is a 89 y.o. male with medical history significant for hypertension, CHF, COPD, GERD, atrial fibrillation, OSA, PVD and osteoarthritis, who was just discharged 3 days ago after being managed for RSV infection and acute respiratory failure with hypoxia.  Per his family he has not been doing better since he was discharged and continues to have worsening cough with mostly clear sputum expectoration and dyspnea with associated generalized weakness.    On presentation hemodynamically stable, labs with mild hyponatremia and CO2 of 20, blood glucose of 155, BUN 40, creatinine 1.29, BNP 613 and troponin 142>>131.  Lactic acid 3.4>>3.7, WBC 22.6 with predominant neutrophil. Chest x-ray with persistent bilateral lower lobe airspace opacities concerning for pneumonia and small bilateral pleural effusion. EKG with atrial fibrillation, controlled ventricular response and RBBB.   Patient was given cefepime  and vancomycin  along with DuoNeb and Solu-Medrol .  Received IV normal saline.  Started on Rocephin  and Zithromax    He remains stable ORA and continues on IV Abx. PT/OT recommending SNF. TOC consulted.  Assessment and Plan: Sepsis due to pneumonia [facility])- criteria have resolved.  - Sepsis manifested by tachypnea of 23 and leukocytosis of 22.6, lactic acidosis.  preliminary blood cultures negative.  Recent RSV infection now likely superadded bacterial pneumonia. Still RSV positive - Will continue antibiotic therapy with IV Rocephin  and Zithromax  for 5 day treatment. Ending today - Mucolytic therapy be provided as well as duo nebs q.i.d. and q.4 hours p.r.n. - f/u blood cultures- NGTD - PT/OT - breathing treatments  Elevated troponin- peaked at 142 - This is likely secondary to sepsis.  He has no chest pain, ischemia on  ECG  Essential hypertension -  continue home antihypertensive therapy.  Paroxysmal atrial fibrillation (HCC) - continue Xarelto .  GERD without esophagitis - continue PPI therapy.  Chronic diastolic CHF (congestive heart failure) (HCC) - No clinical exacerbation at this time despite elevated BNP.  Subjective: Patient endorses no complaints today. He is addiment about not going back to [facility] and states they are terrible and will just send him back here  Physical Exam: Vitals:   04/19/23 0311 04/19/23 0755 04/19/23 1454 04/19/23 2029  BP: (!) 149/70 136/66 119/60 (!) 146/64  Pulse: (!) 58 (!) 55 (!) 55 (!) 52  Resp: 18 18 18 20   Temp: 98.3 F (36.8 C) 98 F (36.7 C) 98.3 F (36.8 C) 98.2 F (36.8 C)  TempSrc: Oral  Oral Oral  SpO2: 92% 93% 94% 97%  Weight:      Height:       General.  Frail, elderly, in no acute distress. Pulmonary.  CTAB, normal respiratory effort. CV.  Regular rate and rhythm, no JVD, rub or murmur. Abdomen.  Soft, nontender, nondistended, BS positive. CNS.  Alert and oriented .  No focal neurologic deficit. Extremities.  No edema, no cyanosis, pulses intact and symmetrical. Psychiatry. Normal mood  Data Reviewed: Prior data reviewed  Family Communication: attempt to call daughter on phone  Disposition: Status is: Inpatient Remains inpatient appropriate because: Severity of illness  Planned Discharge Destination: SNF  DVT prophylaxis.  Xarelto  Time spent: 38 minutes  Author: Marien LITTIE Piety, MD 04/20/2023 7:41 AM  For on call review [email or web address].

## 2023-04-20 NOTE — TOC Progression Note (Signed)
 Transition of Care [facility]) - Progression Note    Patient Details  Name: Dennis Savage MRN: [id number] Date of Birth: 1929/05/31  Transition of Care [facility]) CM/SW Contact  Dennis Macken E [name], LCSW Phone Number: 04/20/2023, 1:16 PM  Clinical Narrative:    Attempted to speak with patient at bedside. Patient sleeping will reattempt later.        Expected Discharge Plan and Services                                               Social Determinants of Health (SDOH) Interventions SDOH Screenings   Food Insecurity: No Food Insecurity (04/16/2023)  Housing: Low Risk  (04/16/2023)  Transportation Needs: No Transportation Needs (04/16/2023)  Utilities: Not At Risk (04/16/2023)  Depression (PHQ2-9): Low Risk  (06/30/2021)  Financial Resource Strain: Low Risk  (03/29/2023)   Received from [facility]  Social Connections: Moderately Isolated (04/16/2023)  Tobacco Use: Medium Risk (04/05/2023)    Readmission Risk Interventions    04/18/2023    1:48 PM  Readmission Risk Prevention Plan  Transportation Screening Complete  Medication Review (RN Care Manager) Complete  HRI or Home Care Consult Complete  Palliative Care Screening Not Applicable  Skilled Nursing Facility Complete

## 2023-04-21 DIAGNOSIS — J189 Pneumonia, unspecified organism: Secondary | ICD-10-CM | POA: Diagnosis not present

## 2023-04-21 DIAGNOSIS — A419 Sepsis, unspecified organism: Secondary | ICD-10-CM | POA: Diagnosis not present

## 2023-04-21 MED ORDER — IPRATROPIUM-ALBUTEROL 0.5-2.5 (3) MG/3ML IN SOLN
3.0000 mL | Freq: Two times a day (BID) | RESPIRATORY_TRACT | Status: DC
Start: 2023-04-21 — End: 2023-04-27
  Administered 2023-04-21 – 2023-04-27 (×11): 3 mL via RESPIRATORY_TRACT
  Filled 2023-04-21 (×11): qty 3

## 2023-04-21 NOTE — TOC Progression Note (Signed)
 Transition of Care [facility]) - Progression Note    Patient Details  Name: Dennis Savage MRN: [id number] Date of Birth: [birth year]  Transition of Care [facility]) CM/SW Contact  Emigdio Wildeman E Lukasz Rogus, LCSW Phone Number: 04/21/2023, 12:39 PM  Clinical Narrative:    CSW spoke to patient at bedside regarding bed offers. Patient says he is not agreeing to SNF at this point. He states he lives with his daughter and son in law, but is home alone during the day. He states he is going to talk to his family today about returning home. Inquired if he has a top choice of his current bed offers if he does need SNF, he said ask me tomorrow.         Expected Discharge Plan and Services                                               Social Determinants of Health (SDOH) Interventions SDOH Screenings   Food Insecurity: No Food Insecurity (04/16/2023)  Housing: Low Risk  (04/16/2023)  Transportation Needs: No Transportation Needs (04/16/2023)  Utilities: Not At Risk (04/16/2023)  Depression (PHQ2-9): Low Risk  (06/30/2021)  Financial Resource Strain: Low Risk  (03/29/2023)   Received from [facility]  Social Connections: Moderately Isolated (04/16/2023)  Tobacco Use: Medium Risk (04/05/2023)    Readmission Risk Interventions    04/18/2023    1:48 PM  Readmission Risk Prevention Plan  Transportation Screening Complete  Medication Review (RN Care Manager) Complete  HRI or Home Care Consult Complete  Palliative Care Screening Not Applicable  Skilled Nursing Facility Complete

## 2023-04-21 NOTE — Plan of Care (Signed)

## 2023-04-21 NOTE — Progress Notes (Signed)
  Progress Note Patient: Dennis Savage FMW:[id number] DOB: 10-18-1929 DOA: 04/15/2023     5 DOS: the patient was seen and examined on 04/21/2023   Brief hospital course: Dennis Savage is a 89 y.o. male with medical history significant for hypertension, CHF, COPD, GERD, atrial fibrillation, OSA, PVD and osteoarthritis, who was just discharged 3 days ago after being managed for RSV infection and acute respiratory failure with hypoxia.  Per his family he has not been doing better since he was discharged and continues to have worsening cough with mostly clear sputum expectoration and dyspnea with associated generalized weakness.    On presentation hemodynamically stable, labs with mild hyponatremia and CO2 of 20, blood glucose of 155, BUN 40, creatinine 1.29, BNP 613 and troponin 142>>131.  Lactic acid 3.4>>3.7, WBC 22.6 with predominant neutrophil. Chest x-ray with persistent bilateral lower lobe airspace opacities concerning for pneumonia and small bilateral pleural effusion. EKG with atrial fibrillation, controlled ventricular response and RBBB.   Patient was given cefepime  and vancomycin  along with DuoNeb and Solu-Medrol .  Received IV normal saline.  Started on Rocephin  and Zithromax    He remains stable ORA and continues on IV Abx. PT/OT recommending SNF. TOC consulted.  Assessment and Plan: Sepsis due to pneumonia [facility])- criteria have resolved.  - Sepsis manifested by tachypnea of 23 and leukocytosis of 22.6, lactic acidosis.  preliminary blood cultures negative.  Recent RSV infection now likely superadded bacterial pneumonia. Still RSV positive - Will continue antibiotic therapy with IV Rocephin  and Zithromax  for 5 day treatment. Ending today - Mucolytic therapy be provided as well as duo nebs - f/u blood cultures- NGTD - PT/OT  Elevated troponin- peaked at 142 - This is likely secondary to sepsis.  He has no chest pain, ischemia on ECG  Essential hypertension -  continue home  antihypertensive therapy.  Paroxysmal atrial fibrillation (HCC) - continue Xarelto .  GERD without esophagitis - continue PPI therapy.  Chronic diastolic CHF (congestive heart failure) (HCC) - No clinical exacerbation at this time despite elevated BNP.  Subjective: no complaints reported today  Physical Exam: Vitals:   04/20/23 2134 04/20/23 2148 04/21/23 0435 04/21/23 0728  BP: 129/75 129/68 127/64   Pulse: 75 65 (!) 59   Resp: 20  20   Temp: 98.2 F (36.8 C)  98.1 F (36.7 C)   TempSrc: Oral  Oral   SpO2: 93%  93% 93%  Weight:      Height:       General.  Frail, elderly, in no acute distress. Pulmonary.  CTAB, normal respiratory effort. CV.  Regular rate and rhythm, no JVD, rub or murmur. Abdomen.  Soft, nontender, nondistended, BS positive. CNS.  Alert and oriented .  No focal neurologic deficit. Extremities.  No edema, no cyanosis, pulses intact and symmetrical. Psychiatry. Normal mood  Data Reviewed: Prior data reviewed  Family Communication: attempt to call daughter on phone  Disposition: Status is: Inpatient Remains inpatient appropriate because: Severity of illness  Planned Discharge Destination: SNF  DVT prophylaxis.  Xarelto  Time spent: 38 minutes  Author: Marien LITTIE Piety, MD 04/21/2023 7:33 AM  For on call review [email or web address].

## 2023-04-21 NOTE — Progress Notes (Signed)
 Mobility Specialist - Progress Note     04/21/23 1509  Mobility  Activity Ambulated with assistance in hallway;Stood at bedside  Level of Assistance Contact guard assist, steadying assist  Assistive Device Front wheel walker  Distance Ambulated (ft) 90 ft  Range of Motion/Exercises Active  Activity Response Tolerated well  Mobility Referral Yes  Mobility visit 1 Mobility   Pt resting in bed on RA upon entry. Pt STS and ambulates to hallway around NS CGA with RW. Pt endorses feeling weak during ambulation and minimal shaking observed. Author close CGA to prevent fall. Pt returned to bed and left with needs in reach.   Guido Rumble Mobility Specialist 04/21/23, 3:18 PM

## 2023-04-22 ENCOUNTER — Inpatient Hospital Stay: Payer: Medicare Other

## 2023-04-22 DIAGNOSIS — K56609 Unspecified intestinal obstruction, unspecified as to partial versus complete obstruction: Secondary | ICD-10-CM

## 2023-04-22 DIAGNOSIS — A419 Sepsis, unspecified organism: Secondary | ICD-10-CM | POA: Diagnosis not present

## 2023-04-22 DIAGNOSIS — J189 Pneumonia, unspecified organism: Secondary | ICD-10-CM | POA: Diagnosis not present

## 2023-04-22 MED ORDER — METOCLOPRAMIDE HCL 5 MG/ML IJ SOLN
5.0000 mg | Freq: Once | INTRAMUSCULAR | Status: AC
  Administered 2023-04-22: 5 mg via INTRAVENOUS
  Filled 2023-04-22: qty 2

## 2023-04-22 MED ORDER — IOHEXOL 9 MG/ML PO SOLN
500.0000 mL | Freq: Once | ORAL | Status: AC | PRN
  Administered 2023-04-22: 500 mL via ORAL

## 2023-04-22 MED ORDER — SODIUM CHLORIDE 0.9 % IV SOLN: INTRAVENOUS | Status: AC

## 2023-04-22 NOTE — Progress Notes (Signed)
 Physical Therapy Treatment Patient Details Name: Dennis Savage MRN: [id number] DOB: 27-Apr-1929 Today's Date: 04/22/2023   History of Present Illness Pt is a 89 yo male that presented from [facility] for SOB, hypoxia, workup for sepsis due to PNA. PMH arthritis, CHF, COPD, gastric reflux, hypertension, dizziness, LBP, and PVD, R and L THA.    PT Comments  Patient progressing towards physical therapy goals. Completed bed mobility with supervision, however heavy use of bed rails and HOB elevated. Able to complete 39' with RW and CGA progressing to supervision. SpO2 >90% throughout mobility on RA. Discharge plan remains appropriate.     If plan is discharge home, recommend the following: Assistance with cooking/housework;Direct supervision/assist for medications management;Assist for transportation;Help with stairs or ramp for entrance;A little help with walking and/or transfers;A little help with bathing/dressing/bathroom   Can travel by private vehicle     No  Equipment Recommendations  Other (comment) (TBD)    Recommendations for Other Services       Precautions / Restrictions Precautions Precautions: Fall Restrictions Weight Bearing Restrictions Per Provider Order: No     Mobility  Bed Mobility Overal bed mobility: Needs Assistance Bed Mobility: Supine to Sit, Sit to Supine     Supine to sit: Supervision, HOB elevated, Used rails Sit to supine: Supervision, Used rails, HOB elevated        Transfers Overall transfer level: Needs assistance Equipment used: Rolling [name] (2 wheels) Transfers: Sit to/from Stand Sit to Stand: Supervision                Ambulation/Gait Ambulation/Gait assistance: Contact guard assist, Supervision Gait Distance (Feet): 80 Feet Assistive device: Rolling [name] (2 wheels) Gait Pattern/deviations: Step-through pattern Gait velocity: decreased     General Gait Details: SpO2 >90% during mobility on RA. CGA progressing to  supervision   Stairs             Wheelchair Mobility     Tilt Bed    Modified Rankin (Stroke Patients Only)       Balance Overall balance assessment: Needs assistance Sitting-balance support: Feet supported Sitting balance-Leahy Scale: Good     Standing balance support: Bilateral upper extremity supported, Reliant on assistive device for balance, During functional activity Standing balance-Leahy Scale: Fair                              Cognition Arousal: Alert Behavior During Therapy: WFL for tasks assessed/performed Overall Cognitive Status: Within Functional Limits for tasks assessed                                          Exercises      General Comments        Pertinent Vitals/Pain Pain Assessment Pain Assessment: No/denies pain    Home Living                          Prior Function            PT Goals (current goals can now be found in the care plan section) Acute Rehab PT Goals Patient Stated Goal: to get stronger PT Goal Formulation: With patient Time For Goal Achievement: 05/01/23 Potential to Achieve Goals: Good Progress towards PT goals: Progressing toward goals    Frequency    Min 1X/week  PT Plan      Co-evaluation              AM-PAC PT 6 Clicks Mobility   Outcome Measure  Help needed turning from your back to your side while in a flat bed without using bedrails?: A Little Help needed moving from lying on your back to sitting on the side of a flat bed without using bedrails?: A Little Help needed moving to and from a bed to a chair (including a wheelchair)?: A Little Help needed standing up from a chair using your arms (e.g., wheelchair or bedside chair)?: A Little Help needed to walk in hospital room?: A Little Help needed climbing 3-5 steps with a railing? : A Little 6 Click Score: 18    End of Session   Activity Tolerance: Patient tolerated treatment  well Patient left: in bed;with call bell/phone within reach;with bed alarm set Nurse Communication: Mobility status PT Visit Diagnosis: Difficulty in walking, not elsewhere classified (R26.2);Muscle weakness (generalized) (M62.81)     Time: 8583-8571 PT Time Calculation (min) (ACUTE ONLY): 12 min  Charges:    $Therapeutic Activity: 8-22 mins PT General Charges $$ ACUTE PT VISIT: 1 Visit                     [name], PT, DPT Physical Therapist - [facility]  [facility]    [name] 04/22/2023, 2:39 PM

## 2023-04-22 NOTE — Plan of Care (Signed)

## 2023-04-22 NOTE — Progress Notes (Signed)
       CROSS COVER NOTE  NAME: Dennis Savage MRN: [id number] DOB : [birth year]    Date of Service   04/22/2023   HPI/Events of Note   Nursing paged because patient reported feeling nauseous-after administration of as needed Zofran .  Nurse reported that patient had multiple episodes of vomiting and reports feeling bloated.  On assessment patient abdomen distended with diminished bowel sounds.  Patient reported feeling this way since the night before but did not reported to the physician.  There is concern for possible bowel obstruction/ileus this time.  Interventions   -Metoclopramide  ordered to aid with nausea. -KUB ordered stat and it showed  concern for pneumatosis... And concern for severe ileus or evolving small bowel obstruction.  -Stat CT abdomen pelvis ordered without contrast due to patient's renal status. -Attending MD to follow-up CT results.       Harjot Zavadil Lamin Syble Picco, MSN, APRN, AGACNP-BC [facility] [place] Pager: [phone]. Check Amion for Availability

## 2023-04-22 NOTE — Progress Notes (Addendum)
 Progress Note Patient: Dennis Savage FMW:[id number] DOB: 04-22-1929 DOA: 04/15/2023     6 DOS: the patient was seen and examined on 04/22/2023   Brief hospital course: PAO HAFFEY is a 89 y.o. male with medical history significant for hypertension, CHF, COPD, GERD, atrial fibrillation, OSA, PVD and osteoarthritis, who was just discharged 3 days ago after being managed for RSV infection and acute respiratory failure with hypoxia. Presents again for failure to improve with SOB.   On presentation hemodynamically stable, labs with mild hyponatremia and CO2 of 20, blood glucose of 155, BUN 40, creatinine 1.29, BNP 613 and troponin 142>>131.  Lactic acid 3.4>>3.7, WBC 22.6 with predominant neutrophil. Chest x-ray with persistent bilateral lower lobe airspace opacities concerning for pneumonia and small bilateral pleural effusion. EKG with atrial fibrillation, controlled ventricular response and RBBB.   Patient was given cefepime  and vancomycin  along with DuoNeb and Solu-Medrol .  Received IV normal saline.  Started on Rocephin  and Zithromax    He remains stable ORA and continues on IV Abx. PT/OT recommending SNF. TOC consulted.  1/6: Overnight in early morning hours of 1/6- patient experienced nausea and vomiting with abdominal pain. Saw to have SBO vs ileus on KUB. Made him NPO and ordered NG tube as well as enema. He is back on [place] 1L. He is alert and oriented on exam and denies abdominal pain currently but he feels not good.   Assessment and Plan: Sepsis due to pneumonia [facility])- criteria have resolved.  - Sepsis manifested by tachypnea of 23 and leukocytosis of 22.6, lactic acidosis.  preliminary blood cultures negative.  Recent RSV infection now likely superadded bacterial pneumonia. Still RSV positive - completed Rocephin  and Zithromax . - Mucolytic therapy be provided as well as duo nebs - f/u blood cultures- NGTD - PT/OT  Suspected severe ileus vs SBO- stable. KUB overnight shows distended  stomach and small bowel. - anti-emetics PRN - IVF - NPO - NG tube - enema - CT abdomen without contrast due to renal function  Elevated troponin- peaked at 142 - This is likely secondary to sepsis.  He has no chest pain, ischemia on ECG  Essential hypertension -  continue home antihypertensive therapy.  Paroxysmal atrial fibrillation (HCC) - continue Xarelto .  GERD without esophagitis - continue PPI therapy.  Chronic diastolic CHF (congestive heart failure) (HCC) - No clinical exacerbation at this time despite elevated BNP.  Subjective: no complaints reported today  Physical Exam: Vitals:   04/21/23 1813 04/21/23 1857 04/21/23 2008 04/22/23 0725  BP:  138/70 120/73 (!) 119/57  Pulse:  72 72 72  Resp:  18  14  Temp:  (!) 97.5 F (36.4 C) 97.6 F (36.4 C) 97.8 F (36.6 C)  TempSrc:    Oral  SpO2: 94% 94% 92% (!) 89%  Weight:  86.6 kg    Height:       General.  Frail, elderly, in no acute distress. Ill-appearing Pulmonary.  CTAB, normal respiratory effort. CV.  Regular rate and rhythm, no JVD, rub or murmur. Abdomen.  Soft, nontender, nondistended, BS reduced CNS.  Alert and oriented x3.  No focal neurologic deficit. Extremities.  No edema, no cyanosis, pulses intact and symmetrical. Psychiatry. Normal mood  Data Reviewed: Prior data reviewed  Family Communication: attempt to call daughter on phone. Left VM  Disposition: Status is: Inpatient Remains inpatient appropriate because: Severity of illness  Planned Discharge Destination: SNF  DVT prophylaxis.  Xarelto  Time spent: 38 minutes  Author: Marien LITTIE Piety, MD 04/22/2023 7:27  AM  For on call review [email or web address].

## 2023-04-23 ENCOUNTER — Inpatient Hospital Stay: Payer: Medicare Other

## 2023-04-23 DIAGNOSIS — J189 Pneumonia, unspecified organism: Secondary | ICD-10-CM | POA: Diagnosis not present

## 2023-04-23 DIAGNOSIS — A419 Sepsis, unspecified organism: Secondary | ICD-10-CM | POA: Diagnosis not present

## 2023-04-23 DIAGNOSIS — K56609 Unspecified intestinal obstruction, unspecified as to partial versus complete obstruction: Secondary | ICD-10-CM | POA: Diagnosis not present

## 2023-04-23 LAB — CBC
HCT: 37.7 % — ABNORMAL LOW (ref 39.0–52.0)
Hemoglobin: 12 g/dL — ABNORMAL LOW (ref 13.0–17.0)
MCH: 29.7 pg (ref 26.0–34.0)
MCHC: 31.8 g/dL (ref 30.0–36.0)
MCV: 93.3 fL (ref 80.0–100.0)
Platelets: 136 10*3/uL — ABNORMAL LOW (ref 150–400)
RBC: 4.04 MIL/uL — ABNORMAL LOW (ref 4.22–5.81)
RDW: 13.7 % (ref 11.5–15.5)
WBC: 4.7 10*3/uL (ref 4.0–10.5)
nRBC: 0 % (ref 0.0–0.2)

## 2023-04-23 LAB — BASIC METABOLIC PANEL
Anion gap: 9 (ref 5–15)
BUN: 29 mg/dL — ABNORMAL HIGH (ref 8–23)
CO2: 27 mmol/L (ref 22–32)
Calcium: 8.2 mg/dL — ABNORMAL LOW (ref 8.9–10.3)
Chloride: 105 mmol/L (ref 98–111)
Creatinine, Ser: 1.29 mg/dL — ABNORMAL HIGH (ref 0.61–1.24)
GFR, Estimated: 52 mL/min — ABNORMAL LOW (ref >60)
Glucose, Bld: 79 mg/dL (ref 70–99)
Potassium: 3.4 mmol/L — ABNORMAL LOW (ref 3.5–5.1)
Sodium: 141 mmol/L (ref 135–145)

## 2023-04-23 LAB — APTT: aPTT: 28 s (ref 24–36)

## 2023-04-23 LAB — HEPARIN LEVEL (UNFRACTIONATED): Heparin Unfractionated: 0.5 [IU]/mL (ref 0.30–0.70)

## 2023-04-23 MED ORDER — HEPARIN (PORCINE) 25000 UT/250ML-% IV SOLN: 1300.0000 [IU]/h | INTRAVENOUS | Status: DC

## 2023-04-23 MED ORDER — HEPARIN (PORCINE) 25000 UT/250ML-% IV SOLN
1300.0000 [IU]/h | INTRAVENOUS | Status: DC
  Administered 2023-04-23: 1300 [IU]/h via INTRAVENOUS
  Filled 2023-04-23: qty 250

## 2023-04-23 NOTE — Progress Notes (Addendum)
 Progress Note Patient: Dennis Savage FMW:[id number] DOB: [birth year] DOA: 04/15/2023     7 DOS: the patient was seen and examined on 04/23/2023   Brief hospital course: Dennis Savage is a 89 y.o. male with medical history significant for hypertension, CHF, COPD, GERD, atrial fibrillation, OSA, PVD and osteoarthritis, who was just discharged 3 days ago after being managed for RSV infection and acute respiratory failure with hypoxia. Presents again for failure to improve with SOB.   On presentation hemodynamically stable, labs with mild hyponatremia and CO2 of 20, blood glucose of 155, BUN 40, creatinine 1.29, BNP 613 and troponin 142>>131.  Lactic acid 3.4>>3.7, WBC 22.6 with predominant neutrophil. Chest x-ray with persistent bilateral lower lobe airspace opacities concerning for pneumonia and small bilateral pleural effusion. EKG with atrial fibrillation, controlled ventricular response and RBBB.   Patient was given cefepime  and vancomycin  along with DuoNeb and Solu-Medrol .  Received IV normal saline.  Started on Rocephin  and Zithromax    He remains stable ORA and continues on IV Abx. PT/OT recommending SNF. TOC consulted.  1/6: Overnight in early morning hours of 1/6- patient experienced nausea and vomiting with abdominal pain. Saw to have SBO vs ileus on KUB. Made him NPO and ordered NG tube as well as enema. He is back on [place] 1L. He is alert and oriented on exam and denies abdominal pain currently but he feels not good.   1/7: nurse stated that she ran out of time so did not put in his NG tube yesterday. Repeat KUB this morning still showing likely obstruction. Communicated with nurse again to put in NG tube. Fortunately patient feels improved today with soft abdomen. Remains NPO and on IV fluids. Will transition his anticoagulation to IV today.   Assessment and Plan: Sepsis due to pneumonia [facility])- criteria have resolved. blood cultures negative.  Recent RSV infection now likely superadded  bacterial pneumonia. Still RSV positive - completed Rocephin  and Zithromax . - Mucolytic therapy, duo nebs PRN - PT/OT - wean O2. Was restarted overnight  Suspected severe ileus vs SBO-. KUB showed distended stomach and small bowel after overnight vomiting episode. CT abdomen findings favored ileus over SBO. Repeat KUB continues to show dilation although clinically he is improved. Has small BM after enema yesterday. - anti-emetics PRN - IVF - NPO - NG tube - transition afib anticoagulation to IV until taking PO again.   Elevated troponin- peaked at 142 - This is likely secondary to sepsis.  He has no chest pain, ischemia on ECG  Essential hypertension - continue home antihypertensive therapy.  Paroxysmal atrial fibrillation (HCC) - continue Xarelto  when tolerating PO again, will transition to heparin  gtt while NPO.  GERD without esophagitis - continue PPI therapy.  Chronic diastolic CHF (congestive heart failure) (HCC) - No clinical exacerbation at this time despite elevated BNP.  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   Subjective: reports having some LUQ discomfort this morning. No vomiting this am. Small BM after enema yesterday.  Physical Exam: Vitals:   04/22/23 1528 04/22/23 1933 04/22/23 2011 04/23/23 0444  BP: 113/69 134/64  131/66  Pulse: 62 (!) 52  (!) 49  Resp: 16     Temp: 97.9 F (36.6 C) 97.6 F (36.4 C)  (!) 97.5 F (36.4 C)  TempSrc: Oral     SpO2: 92% 93% 95% 94%  Weight:      Height:       General.  Frail, elderly, in no acute distress. Ill-appearing Pulmonary.  CTAB, normal  respiratory effort. CV.  Regular rate and rhythm, no JVD, rub or murmur. Abdomen.  Soft, mild tenderness to L half, nondistended, BS reduced CNS.  Alert and oriented x3.  No focal neurologic deficit. Extremities.  No edema, no cyanosis, pulses intact and symmetrical. Psychiatry. Normal mood  Data Reviewed: Prior data reviewed  Family Communication:  attempt to call daughter on phone. Left VM  Disposition: Status is: Inpatient Remains inpatient appropriate because: Severity of illness  Planned Discharge Destination: SNF  DVT prophylaxis.  Xarelto  Time spent: 38 minutes  Author: Marien LITTIE Piety, MD 04/23/2023 7:04 AM  For on call review [email or web address].

## 2023-04-23 NOTE — Progress Notes (Signed)
 This RN approached patient to place NG tube and initiate heparin drip.  Patient states that he does not it done until he talks to the doctor.  Patient asked questions that is out of the scope of RN practice.

## 2023-04-23 NOTE — Progress Notes (Signed)
 Occupational Therapy Treatment Patient Details Name: Dennis Savage MRN: [id number] DOB: 1929/05/24 Today's Date: 04/23/2023   History of present illness Pt is a 89 yo male that presented from [facility] for SOB, hypoxia, workup for sepsis due to PNA. PMH arthritis, CHF, COPD, gastric reflux, hypertension, dizziness, LBP, and PVD, R and L THA.   OT comments  Pt seen for OT tx. Pt completed bed mobility and ADL transfers from std bed and std toileting with supervision + RW. Pt ambulated to/from bathroom with supv/CGA demonstrating fair balance. After return to EOB, pt endorsing feeling mild SOB. SpO2 >90% on RA with exertion. SOB improving with seated rest break and >95% back on 2L at end of session. Pt demonstrating good progress towards goals. Continues to benefit from skilled OT services. Will continue to progress as able.       If plan is discharge home, recommend the following:  A little help with walking and/or transfers;A little help with bathing/dressing/bathroom;Assistance with cooking/housework;Assist for transportation   Equipment Recommendations  BSC/3in1    Recommendations for Other Services      Precautions / Restrictions Precautions Precautions: Fall Restrictions Weight Bearing Restrictions Per Provider Order: No Other Position/Activity Restrictions: watch O2 sats       Mobility Bed Mobility Overal bed mobility: Needs Assistance Bed Mobility: Supine to Sit, Sit to Supine     Supine to sit: Supervision, HOB elevated, Used rails Sit to supine: Supervision, Used rails, HOB elevated   General bed mobility comments: increased effort to complete but no direct assist required    Transfers Overall transfer level: Needs assistance Equipment used: Rolling walker (2 wheels) Transfers: Sit to/from Stand Sit to Stand: Supervision           General transfer comment: supv from EOB and std height toilet     Balance Overall balance assessment: Needs  assistance Sitting-balance support: Feet supported Sitting balance-Leahy Scale: Good     Standing balance support: Bilateral upper extremity supported, Reliant on assistive device for balance, During functional activity Standing balance-Leahy Scale: Fair                             ADL either performed or assessed with clinical judgement   ADL Overall ADL's : Needs assistance/impaired                         Toilet Transfer: Regular Toilet;Rolling walker (2 wheels);Grab bars;Supervision/safety   Toileting- Clothing Manipulation and Hygiene: Modified independent;Sitting/lateral lean       Functional mobility during ADLs: Contact guard assist;Rolling walker (2 wheels)      Extremity/Trunk Assessment              Vision       Perception     Praxis      Cognition Arousal: Alert Behavior During Therapy: WFL for tasks assessed/performed Overall Cognitive Status: Within Functional Limits for tasks assessed                                          Exercises      Shoulder Instructions       General Comments SpO2 >90% on RA with exertion. Pt noting mild SOB after toileting, improving with seated rest break and >95% back on 2L at end of session.    Pertinent Vitals/ Pain  Pain Assessment Pain Assessment: No/denies pain  Home Living                                          Prior Functioning/Environment              Frequency  Min 1X/week        Progress Toward Goals  OT Goals(current goals can now be found in the care plan section)  Progress towards OT goals: Progressing toward goals  Acute Rehab OT Goals Patient Stated Goal: get better OT Goal Formulation: With patient Time For Goal Achievement: 05/01/23 Potential to Achieve Goals: Good  Plan      Co-evaluation                 AM-PAC OT 6 Clicks Daily Activity     Outcome Measure   Help from another person eating  meals?: None Help from another person taking care of personal grooming?: None Help from another person toileting, which includes using toliet, bedpan, or urinal?: A Little Help from another person bathing (including washing, rinsing, drying)?: A Lot Help from another person to put on and taking off regular upper body clothing?: A Little Help from another person to put on and taking off regular lower body clothing?: A Lot 6 Click Score: 18    End of Session Equipment Utilized During Treatment: Oxygen;Rolling walker (2 wheels)  OT Visit Diagnosis: Unsteadiness on feet (R26.81);Muscle weakness (generalized) (M62.81)   Activity Tolerance Patient tolerated treatment well   Patient Left in bed;with call bell/phone within reach   Nurse Communication Mobility status;Other (comment) (BM)        Time: 8379-8358 OT Time Calculation (min): 21 min  Charges: OT General Charges $OT Visit: 1 Visit OT Treatments $Self Care/Home Management : 8-22 mins  Warren SAUNDERS., MPH, MS, OTR/L ascom [phone] 04/23/23, 4:49 PM

## 2023-04-23 NOTE — Plan of Care (Signed)
  Problem: Fluid Volume: Goal: Hemodynamic stability will improve Outcome: Progressing   Problem: Respiratory: Goal: Ability to maintain adequate ventilation will improve Outcome: Progressing   Problem: Education: Goal: Knowledge of General Education information will improve Description: Including pain rating scale, medication(s)/side effects and non-pharmacologic comfort measures Outcome: Progressing   Problem: Health Behavior/Discharge Planning: Goal: Ability to manage health-related needs will improve Outcome: Progressing   Problem: Activity: Goal: Risk for activity intolerance will decrease Outcome: Progressing   Problem: Nutrition: Goal: Adequate nutrition will be maintained Outcome: Progressing   Problem: Elimination: Goal: Will not experience complications related to bowel motility Outcome: Progressing   Problem: Pain Management: Goal: General experience of comfort will improve Outcome: Progressing   Problem: Safety: Goal: Ability to remain free from injury will improve Outcome: Progressing   Problem: Skin Integrity: Goal: Risk for impaired skin integrity will decrease Outcome: Progressing

## 2023-04-23 NOTE — Progress Notes (Addendum)
 PHARMACY - ANTICOAGULATION CONSULT NOTE  Pharmacy Consult for Heparin  drip Indication: atrial fibrillation,  pt on Xarelto  PTA, now with SBO. ileus  Allergies  Allergen Reactions   Lisinopril Cough   Penicillins Hives and Swelling    IgE = 17 ((WNL) on 02/08/2023    Patient Measurements: Height: 5' 9 (175.3 cm) Weight: 86.6 kg (190 lb 14.7 oz) IBW/kg (Calculated) : 70.7 Heparin  Dosing Weight:    Vital Signs: Temp: 97.3 F (36.3 C) (01/07 0832) BP: 131/63 (01/07 0832) Pulse Rate: 51 (01/07 0832)  Labs: Recent Labs    04/23/23 0455  HGB 12.0*  HCT 37.7*  PLT 136*  CREATININE 1.29*    Estimated Creatinine Clearance: 40.7 mL/min (A) (by C-G formula based on SCr of 1.29 mg/dL (H)).   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   Taking As Needed   albuterol  (VENTOLIN  HFA) 108 (90 Base) MCG/ACT inhaler Inhale 2 puffs into the lungs every 6 (six) hours as needed for wheezing or shortness of breath.   Taking As Needed    amLODipine  (NORVASC ) 10 MG tablet Take 10 mg by mouth daily.   04/15/2023 at 10:30 AM   Calcium  Carb-Cholecalciferol  (CALCIUM  600 + D PO) Take 1 tablet by mouth daily.   04/15/2023 at 10:30 AM   chlorthalidone  (HYGROTON ) 25 MG tablet Take 25 mg by mouth 2 (two) times a week. Patient takes only on Sunday and Wednesday   04/14/2023   cholecalciferol  (VITAMIN D3) 25 MCG (1000 UNIT) tablet Take 1,000 Units by mouth daily.   04/15/2023 at 10:30 AM   docusate sodium  (COLACE) 100 MG capsule Take 100 mg by mouth 2 (two) times daily.   04/15/2023 at 10:30 AM   fluticasone  (FLONASE ) 50 MCG/ACT nasal spray Place 1 spray into both nostrils 2 (two) times daily.   04/15/2023 at 10:30 AM   Fluticasone -Umeclidin-Vilant (TRELEGY ELLIPTA) 100-62.5-25 MCG/ACT AEPB Inhale 1 puff into the lungs daily.   04/14/2023   guaiFENesin  (MUCINEX ) 600 MG 12 hr tablet Take 600 mg by mouth 2 (two) times daily.   04/15/2023 at 10:30 AM   Multiple Vitamins-Minerals (PRESERVISION AREDS 2 PO) Take 1 capsule by mouth 2 (two) times daily.   04/15/2023 at 10:30 AM   Omega-3 Fatty Acids (FISH OIL) 300 MG CAPS Take 300 mg by mouth daily.   04/15/2023 at 10:30 AM   omeprazole (PRILOSEC) 20 MG capsule Take 20 mg by mouth daily.   04/15/2023 at  6:01 AM   predniSONE  (DELTASONE ) 10 MG tablet 40mg  daily x 2 days,  30 mg daily x 2 days, 20mg  daily x 2 days, 10mg  daily x 2 days, then stop 20 tablet 0 04/15/2023 at 10:30 AM   relugolix  (ORGOVYX ) 120 MG tablet Take 1 tablet (120 mg total) by mouth daily. 30 tablet 11 04/15/2023 at 10:47 AM   rivaroxaban  (XARELTO ) 20 MG TABS tablet Take 20 mg by mouth daily with supper.   04/14/2023   traMADol  (ULTRAM ) 50 MG tablet Take 50 mg by mouth 2 (two) times daily as needed for moderate pain (pain score 4-6) or severe pain (pain score 7-10).   Taking As Needed   Rivaroxaban  (XARELTO ) 15 MG TABS tablet Take 1 tablet (15 mg total) by mouth daily with supper. (Patient not taking: Reported on 04/16/2023) 30 tablet 0  Not Taking   Scheduled:   amLODipine   10 mg Oral Daily   budesonide  (PULMICORT ) nebulizer solution  0.25 mg Nebulization BID   calcium -vitamin D   1 tablet Oral Daily   chlorthalidone   25 mg Oral Once per day on Monday Thursday   cholecalciferol   1,000 Units Oral Daily   fluticasone   1 spray Each Nare BID   guaiFENesin   600 mg Oral BID   ipratropium-albuterol   3 mL Nebulization BID   multivitamin  1 tablet Oral BID   omega-3 acid ethyl esters  1 g Oral Daily   pantoprazole   40 mg Oral Daily   Infusions:   sodium chloride  50 mL/hr at 04/22/23 1553   heparin       Assessment: 89 yo male to start heparin  drip for Afib as NPO d/t SBO/ileus and Xarelto  now on hold. PMHx: HTN, CHF, COPD, GERD, atrial fibrillation, OSA, PVD, OA, prostate cancer  Baseline 1/7: Hgb 12.0  Plt 136  aPTT 28,  HL 0.50 Last dose of Xarelto  15 mg given 04/20/22@1715   Goal of Therapy:  Heparin  level 0.3-0.7 units/ml aPTT 66-102 seconds Monitor platelets by anticoagulation protocol: Yes   Plan:  No bolus d/t recent Xarelto  Start heparin  infusion at 1300 units/hr Check anti-Xa level in 8 hours and daily while on heparin  Continue to monitor H&H and platelets F/u aPTT until correlates with HL  Allean Haas PharmD Clinical Pharmacist 04/23/2023

## 2023-04-23 NOTE — Progress Notes (Signed)
 Heparin  drip and NG tube not placed today due to patient's refusal throughout day.  This RN approached patient regarding NG placement in a.m. with patient reporting desire to speak to provider, Dr Lenon notified at 11:35.   Heparin  drip ordered, patient refused drip, requesting to speak to provider.  Dr Lenon notified at 12:06. At 1516, patient continues to refuse NGT and heparin  drip.  Dr Lenon notified with order to initiate clear liquid diet; heparin  drip not address.

## 2023-04-24 DIAGNOSIS — I480 Paroxysmal atrial fibrillation: Secondary | ICD-10-CM | POA: Diagnosis not present

## 2023-04-24 DIAGNOSIS — I5032 Chronic diastolic (congestive) heart failure: Secondary | ICD-10-CM | POA: Diagnosis not present

## 2023-04-24 DIAGNOSIS — J189 Pneumonia, unspecified organism: Secondary | ICD-10-CM | POA: Diagnosis not present

## 2023-04-24 DIAGNOSIS — A419 Sepsis, unspecified organism: Secondary | ICD-10-CM | POA: Diagnosis not present

## 2023-04-24 LAB — CBC
HCT: 35.1 % — ABNORMAL LOW (ref 39.0–52.0)
Hemoglobin: 11.4 g/dL — ABNORMAL LOW (ref 13.0–17.0)
MCH: 29.8 pg (ref 26.0–34.0)
MCHC: 32.5 g/dL (ref 30.0–36.0)
MCV: 91.9 fL (ref 80.0–100.0)
Platelets: 123 10*3/uL — ABNORMAL LOW (ref 150–400)
RBC: 3.82 MIL/uL — ABNORMAL LOW (ref 4.22–5.81)
RDW: 13.7 % (ref 11.5–15.5)
WBC: 4.7 10*3/uL (ref 4.0–10.5)
nRBC: 0 % (ref 0.0–0.2)

## 2023-04-24 LAB — APTT: aPTT: 70 s — ABNORMAL HIGH (ref 24–36)

## 2023-04-24 MED ORDER — LINACLOTIDE 145 MCG PO CAPS
145.0000 ug | ORAL_CAPSULE | Freq: Every day | ORAL | Status: DC
  Administered 2023-04-24 – 2023-04-30 (×7): 145 ug via ORAL
  Filled 2023-04-24 (×7): qty 1

## 2023-04-24 MED ORDER — POLYETHYLENE GLYCOL 3350 17 G PO PACK
17.0000 g | PACK | Freq: Every day | ORAL | Status: DC
  Administered 2023-04-24 – 2023-04-29 (×6): 17 g via ORAL
  Filled 2023-04-24 (×7): qty 1

## 2023-04-24 MED ORDER — RIVAROXABAN 15 MG PO TABS
15.0000 mg | ORAL_TABLET | Freq: Every day | ORAL | Status: DC
  Administered 2023-04-24 – 2023-04-30 (×7): 15 mg via ORAL
  Filled 2023-04-24 (×7): qty 1

## 2023-04-24 NOTE — Progress Notes (Addendum)
 PHARMACY - ANTICOAGULATION CONSULT NOTE  Pharmacy Consult for Heparin  drip Indication: atrial fibrillation,  pt on Xarelto  PTA, now with SBO. ileus  Allergies  Allergen Reactions   Lisinopril Cough   Penicillins Hives and Swelling    IgE = 17 ((WNL) on 02/08/2023    Patient Measurements: Height: 5' 9 (175.3 cm) Weight: 86.6 kg (190 lb 14.7 oz) IBW/kg (Calculated) : 70.7 Heparin  Dosing Weight:    Vital Signs: Temp: 97.8 F (36.6 C) (01/07 2016) BP: 136/60 (01/07 2016)  Labs: Recent Labs    04/23/23 0455 04/23/23 1130 04/24/23 0431  HGB 12.0*  --  11.4*  HCT 37.7*  --  35.1*  PLT 136*  --  123*  APTT  --  28 70*  HEPARINUNFRC  --  0.50  --   CREATININE 1.29*  --   --     Estimated Creatinine Clearance: 40.7 mL/min (A) (by C-G formula based on SCr of 1.29 mg/dL (H)).   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   Taking As Needed   albuterol  (VENTOLIN  HFA) 108 (90 Base) MCG/ACT inhaler Inhale 2 puffs into the lungs  every 6 (six) hours as needed for wheezing or shortness of breath.   Taking As Needed   amLODipine  (NORVASC ) 10 MG tablet Take 10 mg by mouth daily.   04/15/2023 at 10:30 AM   Calcium  Carb-Cholecalciferol  (CALCIUM  600 + D PO) Take 1 tablet by mouth daily.   04/15/2023 at 10:30 AM   chlorthalidone  (HYGROTON ) 25 MG tablet Take 25 mg by mouth 2 (two) times a week. Patient takes only on Sunday and Wednesday   04/14/2023   cholecalciferol  (VITAMIN D3) 25 MCG (1000 UNIT) tablet Take 1,000 Units by mouth daily.   04/15/2023 at 10:30 AM   docusate sodium  (COLACE) 100 MG capsule Take 100 mg by mouth 2 (two) times daily.   04/15/2023 at 10:30 AM   fluticasone  (FLONASE ) 50 MCG/ACT nasal spray Place 1 spray into both nostrils 2 (two) times daily.   04/15/2023 at 10:30 AM   Fluticasone -Umeclidin-Vilant (TRELEGY ELLIPTA) 100-62.5-25 MCG/ACT AEPB Inhale 1 puff into the lungs daily.   04/14/2023   guaiFENesin  (MUCINEX ) 600 MG 12 hr tablet Take 600 mg by mouth 2 (two) times daily.   04/15/2023 at 10:30 AM   Multiple Vitamins-Minerals (PRESERVISION AREDS 2 PO) Take 1 capsule by mouth 2 (two) times daily.   04/15/2023 at 10:30 AM   Omega-3 Fatty Acids (FISH OIL) 300 MG CAPS Take 300 mg by mouth daily.   04/15/2023 at 10:30  AM   omeprazole (PRILOSEC) 20 MG capsule Take 20 mg by mouth daily.   04/15/2023 at  6:01 AM   predniSONE  (DELTASONE ) 10 MG tablet 40mg  daily x 2 days, 30 mg daily x 2 days, 20mg  daily x 2 days, 10mg  daily x 2 days, then stop 20 tablet 0 04/15/2023 at 10:30 AM   relugolix  (ORGOVYX ) 120 MG tablet Take 1 tablet (120 mg total) by mouth daily. 30 tablet 11 04/15/2023 at 10:47 AM   rivaroxaban  (XARELTO ) 20 MG TABS tablet Take 20 mg by mouth daily with supper.   04/14/2023   traMADol  (ULTRAM ) 50 MG tablet Take 50 mg by mouth 2 (two) times daily as needed for moderate pain (pain score 4-6) or severe pain (pain score 7-10).   Taking As Needed   Rivaroxaban  (XARELTO ) 15 MG TABS tablet Take 1 tablet (15 mg  total) by mouth daily with supper. (Patient not taking: Reported on 04/16/2023) 30 tablet 0 Not Taking   Scheduled:   amLODipine   10 mg Oral Daily   budesonide  (PULMICORT ) nebulizer solution  0.25 mg Nebulization BID   calcium -vitamin D   1 tablet Oral Daily   chlorthalidone   25 mg Oral Once per day on Monday Thursday   cholecalciferol   1,000 Units Oral Daily   fluticasone   1 spray Each Nare BID   guaiFENesin   600 mg Oral BID   ipratropium-albuterol   3 mL Nebulization BID   multivitamin  1 tablet Oral BID   omega-3 acid ethyl esters  1 g Oral Daily   pantoprazole   40 mg Oral Daily   Infusions:   heparin  1,300 Units/hr (04/24/23 0408)    Assessment: 89 yo male to start heparin  drip for Afib as NPO d/t SBO/ileus and Xarelto  now on hold. PMHx: HTN, CHF, COPD, GERD, atrial fibrillation, OSA, PVD, OA, prostate cancer  Baseline 1/7: Hgb 12.0  Plt 136  aPTT 28,  HL 0.50 Last dose of Xarelto  15 mg given 04/20/22@1715   Goal of Therapy:  Heparin  level 0.3-0.7 units/ml aPTT 66-102 seconds Monitor platelets by anticoagulation protocol: Yes  01/08 0431 aPTT, therapeutic x 1 / Plt 123, trending down   Plan: Continue heparin  infusion at 1300 units/hr Recheck aPTT level in 8 hrs to confirm F/u aPTT until correlates with HL CBC & HL daily while on heparin   Rankin CANDIE Dills, PharmD, [facility] 04/24/2023 6:30 AM

## 2023-04-24 NOTE — Evaluation (Addendum)
 Clinical/Bedside Swallow Evaluation Patient Details  Name: Dennis Savage MRN: [id number] Date of Birth: Nov 14, 1929  Today's Date: 04/24/2023 Time: SLP Start Time (ACUTE ONLY): 1340 SLP Stop Time (ACUTE ONLY): 1440 SLP Time Calculation (min) (ACUTE ONLY): 60 min  Past Medical History:  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   Apnea, sleep 07/13/2015   Arthralgia of multiple joints 06/15/2011   Arthritis    Artificial lens present 12/08/2010   Atrial fibrillation (HCC)    Benign essential HTN 11/03/2010   CHF (congestive heart failure) (HCC)    Chronic obstructive pulmonary disease (HCC) 11/03/2010   CN (constipation) 06/15/2011   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   Dizziness 02/25/2015   GERD (gastroesophageal reflux disease)    Heart murmur    Hypertension    Interstitial lung disease (HCC) 10/13/2013   LBP (low back pain) 11/03/2010   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   Peripheral vascular disease (HCC) 11/03/2010   Retinal hemorrhage 03/16/2014   Past Surgical History:  Past Surgical History:  Procedure Laterality Date   APPENDECTOMY     BACK SURGERY     CARPAL TUNNEL RELEASE Bilateral    EYE SURGERY Bilateral    Cataract Extraction with IOL   HERNIA REPAIR     Umbilical Hernia X 3   JOINT REPLACEMENT     bilat knees   NASAL SINUS SURGERY     REPLACEMENT TOTAL KNEE Bilateral    SHOULDER SURGERY Right    TONSILLECTOMY     TOTAL HIP ARTHROPLASTY Right 10/12/2015   Procedure: TOTAL HIP ARTHROPLASTY;  Surgeon: Lynwood SHAUNNA Hue, MD;  Location: [facility] ORS;  Service: Orthopedics;  Laterality: Right;   TOTAL HIP ARTHROPLASTY Left 02/15/2023   Procedure: TOTAL HIP ARTHROPLASTY;  Surgeon: Hue Lynwood SHAUNNA, MD;  Location: [facility] ORS;  Service: Orthopedics;  Laterality: Left;   HPI:  Pt is a 89 y.o. male with medical history significant for hypertension, CHF, COPD, GERD,  atrial fibrillation, OSA, PVD and osteoarthritis, resides at facility who was just discharged 3 days prior to this admit after being managed for RSV infection and acute respiratory failure with hypoxia.  Per his family he has not been doing better since he was discharged and continues to have worsening cough with mostly clear sputum expectoration and dyspnea with associated generalized weakness. He admits to chills without measured fever.  He has not been having much appetite.  His facility sent him as he was felt to be too sick.  He admits to nausea without vomiting.  No chest pain or palpitations.  He has been having watery diarrhea with no melena. No worsening edema.    CT Imaging 04/22/2023: Lower chest: Bibasilar pleural effusions associated passive atelectasis. 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 Mildly dilated loops of small bowel with air-fluid levels.  Contrast reaches the ascending colon. Findings more suggestive of  ileus than small bowel obstruction.    Assessment / Plan / Recommendation  Clinical Impression   Pt seen for BSE. Pt A/O x4; engaged easily w/ this SLP. Verbal and followed instructions and sat EOB for po trials. Fed self. Pt seemed surprised that he could have some difficulty eating steak and chicken in his diet; he also stated he choked on my saliva sometimes when resting. Pt denied any frequency to this issues.  On RA; afebrile, WBC not elevated.    OF NOTE: Pt strongly endorses s/s of Esophageal phase Dysmotility and potential Reflux ongoing for years. He has modified how he eats meats and described that he takes his time even though his Family laughs at me for being last to finish. CT Imaging: Bibasilar pleural effusions associated passive atelectasis.. OF NOTE: Pt had a MBSS in 02/2022 which revealed WFL oropharyngeal phase swallowing w/ No aspiration/penetration  noted during study.   Pt appears to present w/ functional oropharyngeal phase swallowing w/ No overt, oropharyngeal phase dysphagia appreciated during oral intake of trials; No neuromuscular swallowing deficits appreciated. Pt appears at reduced risk for aspiration/aspiration pneumonia from an oropharyngeal phase standpoint when following general aspiration precautions. HOWEVER, d/t pt's Baseline presentation of Esophageal phase Dysmotility and potential Reflux s/s, he is at risk for Regurgitation of Reflux material and aspiration of such. A PPI was initiated on 04/16/2023 per chart.  ANY Dysmotility or Regurgitation of Reflux material can increase risk for aspiration of the Reflux material during Retrograde flow thus impact Voicing and Pulmonary status.     Pt sat upright on EOB and consumed several trials of thin liquids Via Cup, purees, and softened solid foods w/ No overt clinical s/s of aspiration noted; clear vocal quality b/t trials, no cough, no decline in pulmonary status, no multiple swallows noted post initial pharyngeal swallow, no decline in O2 sats(98%). Oral phase appeared [facility] for bolus management and timely A-P transfer/clearing of material. Mastication appropriate for boluses. OM exam was [facility] for oral clearing; lingual/labial movements. No unilateral weakness. Speech clear.    Recommend continue a more Mech Soft/Regular diet (cut/chopped meats, moistened foods) w/ Thin liquids. General aspiration precautions. Alternating food/liquid during meals and Reduced Distractions/Talking during meals recommended. Rest Breaks during meals/oral intake to allow for Esophageal clearing. REFLUX precautions strongly recommended to lessen chance for Regurgitation. Pills Whole in a Puree if needed for cohesion and ease of swallowing.   Recommend pt f/u w/ GI for assessment/management of Reflux and any Esophageal phase Dysmotility. Discussion and handout given on REFLUX precautions, Esophageal phase Motility  in Advanced Ages, and foods/diet, Pills in Puree. No further skilled ST services indicated as pt appears at his Baseline. MD to reconsult ST services if any new needs while admitted. NSG updated. Pt appreciative of Education information. Recommend Dietician f/u for food options in place of problematic meats/foods.  SLP Visit Diagnosis: Dysphagia, unspecified (R13.10) (potential Esophageal phase Dysmotility -- Chronic per pt's description)    Aspiration Risk   (reduced following general aspiration precs.  Concern for REFLUX activity though.)    Diet Recommendation   Thin;Dysphagia 3 (mechanical soft) (more cut meats/moistened foods in diet and avoid problematic foods) = a more Mech Soft/Regular diet (cut/chopped meats, moistened foods) w/ Thin liquids. General aspiration precautions. Alternating food/liquid during meals and Reduced Distractions/Talking during meals recommended. Rest Breaks during meals/oral intake to allow for Esophageal clearing. REFLUX precautions strongly recommended to lessen chance for Regurgitation.   Medication Administration: Whole meds with liquid (or Whole in Puree for cohesion if needed/desired)    Other  Recommendations Recommended Consults: Consider GI evaluation;Consider esophageal assessment (Dietician f/u if indicated) Oral Care Recommendations: Oral care BID;Patient independent with oral care    Recommendations for follow up therapy are one component of a multi-disciplinary discharge planning process, led by the attending physician.  Recommendations may be updated based on patient status, additional functional criteria and insurance authorization.  Follow up Recommendations No SLP follow up  Assistance Recommended at Discharge  PRN  Functional Status Assessment Patient has not had a recent decline in their functional status  Frequency and Duration  (n/a)   (n/a)       Prognosis Prognosis for improved oropharyngeal function: Good Barriers to Reach Goals:  Time post onset;Severity of deficits Barriers/Prognosis Comment: Baseline, known Esophageal phase Dysmotiltiy      Swallow Study   General Date of Onset: 04/15/23 HPI: Pt is a 89 y.o. male with medical history significant for hypertension, CHF, COPD, GERD, atrial fibrillation, OSA, PVD and osteoarthritis, resides at facility who was just discharged 3 days prior to this admit after being managed for RSV infection and acute respiratory failure with hypoxia.  Per his family he has not been doing better since he was discharged and continues to have worsening cough with mostly clear sputum expectoration and dyspnea with associated generalized weakness. He admits to chills without measured fever.  He has not been having much appetite.  His facility sent him as he was felt to be too sick.  He admits to nausea without vomiting.  No chest pain or palpitations.  He has been having watery diarrhea with no melena. No worsening edema.   CT Imaging 04/22/2023: Lower chest: Bibasilar pleural effusions associated passive atelectasis. 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 Mildly dilated loops of small bowel with air-fluid levels.  Contrast reaches the ascending colon. Findings more suggestive of  ileus than small bowel obstruction. Type of Study: Bedside Swallow Evaluation Previous Swallow Assessment: MBSS 02/2022 -- NO oropharyngeal phase dysphagia noted; no cervical esophagus dysmotility impacting swallowing noted. Diet Prior to this Study: Dysphagia 3 (mechanical soft);Thin liquids (Level 0) (per md) Temperature Spikes Noted: No (wbc 4.7) Respiratory Status: Room air History of Recent Intubation: No Behavior/Cognition: Alert;Cooperative;Pleasant mood Oral Cavity Assessment: Within Functional Limits Oral Care Completed by SLP: Recent completion by staff Oral Cavity - Dentition: Dentures, top;Dentures, bottom  (in place) Vision: Functional for self-feeding Self-Feeding Abilities: Able to feed self Patient Positioning: Upright in bed (eob) Baseline Vocal Quality: Normal Volitional Cough: Strong Volitional Swallow: Able to elicit    Oral/Motor/Sensory Function Overall Oral Motor/Sensory Function: Within functional limits   Ice Chips Ice chips: Not tested   Thin Liquid Thin Liquid: Within functional limits Presentation: Cup;Self Fed (10+ trials)    Nectar Thick Nectar Thick Liquid: Not tested   Honey Thick Honey Thick Liquid: Not tested   Puree Puree: Within functional limits Presentation: Self Fed;Spoon (10+)   Solid     Solid: Within functional limits Presentation: Self Fed (8+)        Comer Portugal, MS, CCC-SLP Speech Language Pathologist Rehab Services; [facility] [phone] (ascom) Mizani Dilday 04/24/2023,4:01 PM

## 2023-04-24 NOTE — Progress Notes (Signed)
 Progress Note   Patient: Dennis Savage FMW:[id number] DOB: 12-Jul-1929 DOA: 04/15/2023     8 DOS: the patient was seen and examined on 04/24/2023   Brief hospital course: Dennis Savage is a 89 y.o. male with medical history significant for hypertension, CHF, COPD, GERD, atrial fibrillation, OSA, PVD and osteoarthritis, who was just discharged 3 days ago after being managed for RSV infection and acute respiratory failure with hypoxia.  He had a worsening cough with mostly clear sputum expectoration and dyspnea with associated generalized weakness.   On presentation hemodynamically stable, labs with mild hyponatremia and CO2 of 20, blood glucose of 155, BUN 40, creatinine 1.29, BNP 613 and troponin 142>>131.  Lactic acid 3.4>>3.7, WBC 22.6 with predominant neutrophil. Chest x-ray with persistent bilateral lower lobe airspace opacities concerning for pneumonia and small bilateral pleural effusion. EKG with atrial fibrillation, controlled ventricular response and RBBB.  Patient was given cefepime  and vancomycin  along with DuoNeb and Solu-Medrol .  Received IV normal saline.  Started on Rocephin  and Zithromax . So far patient has completed antibiotics.  Respiratory symptom has improved.  Interim only, patient developed significant abdominal distention, CT abdomen/pelvis showed ileus.  Received enema 1/7, had 2 large loose stools.  Condition had improved.      Principal Problem:   Sepsis due to pneumonia [facility]) Active Problems:   Elevated troponin   Essential hypertension   Paroxysmal atrial fibrillation (HCC)   GERD without esophagitis   Chronic diastolic CHF (congestive heart failure) (HCC)   Assessment and Plan:  Severe sepsis due to pneumonia (HCC) Left lower lobe bacterial pneumonia. Patient met sepsis criteria at the time of admission with tachypnea and leukocytosis.  Severe lactic acidosis of 6.9. Condition appears to be secondary to left lower lobe pneumonia.  This appears to be a  bacterial pneumonia following the initial RSV infection. Patient so far has completed antibiotics. Patient also complaining of some dysphagia, this could be caused by RSV.  Will obtain speech therapy evaluation. Patient also has reported bilateral pleural effusion, I personally reviewed patient's CT scan image, this amount is small, more on the right side.  Not consistent with the parapneumonic effusion.  Suspected severe ileus Irritable bowel syndrome.. CT scan of the abdomen/pelvis favor ileus rather than small bowel obstruction. Patient had colonoscopy about 5 years ago, no malignancy.  He has severe constipation alternating with diarrhea for the last 5 years.  Condition more consistent with irritable bowel syndrome. Patient had an enema yesterday, had a very large loose stools x 2.  No nausea or vomiting.  Abdominal pain has resolved. I will start Linzess  in addition to MiraLAX .   Elevated troponin- peaked at 142 - This is likely secondary to sepsis.  He has no chest pain, ischemia on ECG   Essential hypertension - continue home antihypertensive therapy.   Paroxysmal atrial fibrillation (HCC) Continue heparin , restart Xarelto .  Chronic kidney disease stage IIIa. Hypokalemia. Metabolic acidosis. Condition improved.  Will recheck levels tomorrow.  GERD without esophagitis - continue PPI therapy.   Chronic diastolic CHF (congestive heart failure) (HCC) Patient had a mild elevation of BNP, clinically, patient does not have volume overload.   Left Adrenal mass  Right renal mass.-  Patient be referred to urology after discharge.    Subjective:  Patient had 2 very large loose stools after enema, he became incontinent.  Abdominal pain and nausea vomiting resolved today. He denied any shortness of breath.  Has a nonproductive cough.  Physical Exam: Vitals:   04/23/23 1538  04/23/23 2016 04/24/23 0627 04/24/23 0754  BP:  136/60 (!) 172/76   Pulse: (!) 47  (!) 53   Resp:  18 17    Temp:  97.8 F (36.6 C) 98 F (36.7 C)   TempSrc:      SpO2: 98% 97% 95% 98%  Weight:      Height:       General exam: Appears calm and comfortable  Respiratory system: Clear to auscultation. Respiratory effort normal. Cardiovascular system: S1 & S2 heard, RRR. No JVD, murmurs, rubs, gallops or clicks. No pedal edema. Gastrointestinal system: Abdomen is nondistended, soft and nontender. No organomegaly or masses felt. Normal bowel sounds heard. Central nervous system: Alert and oriented x3. No focal neurological deficits. Extremities: Symmetric 5 x 5 power. Skin: No rashes, lesions or ulcers Psychiatry: Judgement and insight appear normal. Mood & affect appropriate.    Data Reviewed:  Reviewed the CT scan results and images, lab results.  Family Communication: Daughter updated at bedside.  Disposition: Status is: Inpatient Remains inpatient appropriate because: Severity of disease.     Time spent: 50 minutes  Author: Murvin Mana, MD 04/24/2023 11:40 AM  For on call review [email or web address].

## 2023-04-24 NOTE — Progress Notes (Signed)
 Physical Therapy Treatment Patient Details Name: Dennis Savage MRN: [id number] DOB: 05-20-1929 Today's Date: 04/24/2023   History of Present Illness Pt is a 89 yo male that presented from [facility] for SOB, hypoxia, workup for sepsis due to PNA. PMH arthritis, CHF, COPD, gastric reflux, hypertension, dizziness, LBP, and PVD, R and L THA.    PT Comments  Pt did relatively well with all aspects of PT session.  He did not need physical assist with transition to sitting EOB or to standing with walker.  He had some mild fatigue with prolonged bout of ambulation (>150 ft with walker + ~25 ft with single hand on rail), pt's O2 remained in the low 90s t/o the effort on room air with no DOE or SOB.  Pt very pleasant and motivated t/o the session, will benefit from further PT.      If plan is discharge home, recommend the following: Assistance with cooking/housework;Direct supervision/assist for medications management;Assist for transportation;Help with stairs or ramp for entrance;A little help with walking and/or transfers;A little help with bathing/dressing/bathroom   Can travel by private vehicle     Yes  Equipment Recommendations  None recommended by PT (TBD, has rollator)    Recommendations for Other Services       Precautions / Restrictions Precautions Precautions: Fall Restrictions Weight Bearing Restrictions Per Provider Order: No Other Position/Activity Restrictions: watch O2 sats     Mobility  Bed Mobility Overal bed mobility: Modified Independent Bed Mobility: Supine to Sit     Supine to sit: Supervision     General bed mobility comments: increased effort to complete but no direct assist required    Transfers Overall transfer level: Modified independent Equipment used: Rolling walker (2 wheels) Transfers: Sit to/from Stand Sit to Stand: Supervision           General transfer comment: minimal cuing for appropriate sequencing and set up but no physical assist needed from  standard height bed    Ambulation/Gait Ambulation/Gait assistance: Contact guard assist, Supervision Gait Distance (Feet): 200 Feet Assistive device: Rolling walker (2 wheels)         General Gait Details: Pt was able to quickly assume consistent reciprocal cadence with light walker/UE reliance, completed the loop with relative confidence.  Also able to do ~25 ft with single rail and only minimally decreased speed. Pt's SpO2 remains in low 90s during the effort, no DOE/SOB on room air.   Stairs             Wheelchair Mobility     Tilt Bed    Modified Rankin (Stroke Patients Only)       Balance Overall balance assessment: Needs assistance Sitting-balance support: Feet supported Sitting balance-Leahy Scale: Normal     Standing balance support: Bilateral upper extremity supported, Reliant on assistive device for balance, During functional activity Standing balance-Leahy Scale: Good                              Cognition Arousal: Alert Behavior During Therapy: WFL for tasks assessed/performed Overall Cognitive Status: Within Functional Limits for tasks assessed                                 General Comments: HOH but oriented        Exercises      General Comments General comments (skin integrity, edema, etc.): Pt showed good confidence  and general mobility t/o today's session.      Pertinent Vitals/Pain Pain Assessment Pain Location: reports very minimal L hip pain    Home Living                          Prior Function            PT Goals (current goals can now be found in the care plan section) Progress towards PT goals: Progressing toward goals    Frequency    Min 1X/week      PT Plan      Co-evaluation              AM-PAC PT 6 Clicks Mobility   Outcome Measure  Help needed turning from your back to your side while in a flat bed without using bedrails?: None Help needed moving from  lying on your back to sitting on the side of a flat bed without using bedrails?: None Help needed moving to and from a bed to a chair (including a wheelchair)?: A Little Help needed standing up from a chair using your arms (e.g., wheelchair or bedside chair)?: A Little Help needed to walk in hospital room?: A Little Help needed climbing 3-5 steps with a railing? : A Little 6 Click Score: 20    End of Session Equipment Utilized During Treatment: Gait belt Activity Tolerance: Patient tolerated treatment well Patient left: in bed;with call bell/phone within reach;with bed alarm set Nurse Communication: Mobility status PT Visit Diagnosis: Difficulty in walking, not elsewhere classified (R26.2);Muscle weakness (generalized) (M62.81)     Time: 8389-8372 PT Time Calculation (min) (ACUTE ONLY): 17 min  Charges:    $Gait Training: 8-22 mins PT General Charges $$ ACUTE PT VISIT: 1 Visit                     Carmin JONELLE Deed, DPT 04/24/2023, 5:03 PM

## 2023-04-24 NOTE — Plan of Care (Signed)
   Problem: Fluid Volume: Goal: Hemodynamic stability will improve Outcome: Progressing

## 2023-04-24 NOTE — Plan of Care (Signed)
  Problem: Fluid Volume: Goal: Hemodynamic stability will improve Outcome: Progressing   Problem: Clinical Measurements: Goal: Signs and symptoms of infection will decrease Outcome: Progressing   Problem: Respiratory: Goal: Ability to maintain adequate ventilation will improve Outcome: Progressing   Problem: Health Behavior/Discharge Planning: Goal: Ability to manage health-related needs will improve Outcome: Progressing   Problem: Clinical Measurements: Goal: Will remain free from infection Outcome: Progressing Goal: Diagnostic test results will improve Outcome: Progressing Goal: Respiratory complications will improve Outcome: Progressing   Problem: Nutrition: Goal: Adequate nutrition will be maintained Outcome: Progressing   Problem: Coping: Goal: Level of anxiety will decrease Outcome: Progressing   Problem: Pain Management: Goal: General experience of comfort will improve Outcome: Progressing

## 2023-04-24 NOTE — TOC Progression Note (Signed)
 Transition of Care [facility]) - Progression Note    Patient Details  Name: Dennis Savage MRN: [id number] Date of Birth: 1930/02/02  [facility]) CM/SW Contact  Ladene Lady, LCSW Phone Number: 04/24/2023, 11:23 AM  Clinical Narrative:   CSW spoke with pt and daughter at bedside. Daughter reports that she and pt would both like [facility] for rehab.         Expected Discharge Plan and Services                                               Social Determinants of Health (SDOH) Interventions SDOH Screenings   Food Insecurity: No Food Insecurity (04/16/2023)  Housing: Low Risk  (04/16/2023)  Transportation Needs: No Transportation Needs (04/16/2023)  Utilities: Not At Risk (04/16/2023)  Depression (PHQ2-9): Low Risk  (06/30/2021)  Financial Resource Strain: Low Risk  (03/29/2023)   Received from [facility]  Social Connections: Moderately Isolated (04/16/2023)  Tobacco Use: Medium Risk (04/05/2023)    Readmission Risk Interventions    04/18/2023    1:48 PM  Readmission Risk Prevention Plan  Transportation Screening Complete  Medication Review (RN Care Manager) Complete  HRI or Home Care Consult Complete  Palliative Care Screening Not Applicable  Skilled Nursing Facility Complete

## 2023-04-25 DIAGNOSIS — I5032 Chronic diastolic (congestive) heart failure: Secondary | ICD-10-CM | POA: Diagnosis not present

## 2023-04-25 DIAGNOSIS — E663 Overweight: Secondary | ICD-10-CM | POA: Insufficient documentation

## 2023-04-25 DIAGNOSIS — E876 Hypokalemia: Secondary | ICD-10-CM | POA: Insufficient documentation

## 2023-04-25 DIAGNOSIS — D696 Thrombocytopenia, unspecified: Secondary | ICD-10-CM | POA: Insufficient documentation

## 2023-04-25 DIAGNOSIS — A419 Sepsis, unspecified organism: Secondary | ICD-10-CM | POA: Diagnosis not present

## 2023-04-25 DIAGNOSIS — I480 Paroxysmal atrial fibrillation: Secondary | ICD-10-CM | POA: Diagnosis not present

## 2023-04-25 DIAGNOSIS — J189 Pneumonia, unspecified organism: Secondary | ICD-10-CM | POA: Diagnosis not present

## 2023-04-25 LAB — BASIC METABOLIC PANEL
Anion gap: 8 (ref 5–15)
BUN: 19 mg/dL (ref 8–23)
CO2: 25 mmol/L (ref 22–32)
Calcium: 8.3 mg/dL — ABNORMAL LOW (ref 8.9–10.3)
Chloride: 107 mmol/L (ref 98–111)
Creatinine, Ser: 1.16 mg/dL (ref 0.61–1.24)
GFR, Estimated: 59 mL/min — ABNORMAL LOW (ref >60)
Glucose, Bld: 110 mg/dL — ABNORMAL HIGH (ref 70–99)
Potassium: 3.3 mmol/L — ABNORMAL LOW (ref 3.5–5.1)
Sodium: 140 mmol/L (ref 135–145)

## 2023-04-25 LAB — CBC
HCT: 33.8 % — ABNORMAL LOW (ref 39.0–52.0)
Hemoglobin: 11.2 g/dL — ABNORMAL LOW (ref 13.0–17.0)
MCH: 29.4 pg (ref 26.0–34.0)
MCHC: 33.1 g/dL (ref 30.0–36.0)
MCV: 88.7 fL (ref 80.0–100.0)
Platelets: 117 10*3/uL — ABNORMAL LOW (ref 150–400)
RBC: 3.81 MIL/uL — ABNORMAL LOW (ref 4.22–5.81)
RDW: 13.4 % (ref 11.5–15.5)
WBC: 4.3 10*3/uL (ref 4.0–10.5)
nRBC: 0 % (ref 0.0–0.2)

## 2023-04-25 LAB — PROCALCITONIN: Procalcitonin: <0.1 ng/mL

## 2023-04-25 LAB — MAGNESIUM: Magnesium: 1.9 mg/dL (ref 1.7–2.4)

## 2023-04-25 MED ORDER — POTASSIUM CHLORIDE CRYS ER 20 MEQ PO TBCR
40.0000 meq | EXTENDED_RELEASE_TABLET | ORAL | Status: AC
  Administered 2023-04-25 (×2): 40 meq via ORAL
  Filled 2023-04-25 (×2): qty 2

## 2023-04-25 MED ORDER — DEXTROMETHORPHAN POLISTIREX ER 30 MG/5ML PO SUER
30.0000 mg | Freq: Once | ORAL | Status: AC
  Administered 2023-04-25: 30 mg via ORAL
  Filled 2023-04-25: qty 5

## 2023-04-25 MED ORDER — POTASSIUM CHLORIDE CRYS ER 20 MEQ PO TBCR
40.0000 meq | EXTENDED_RELEASE_TABLET | Freq: Once | ORAL | Status: DC
  Filled 2023-04-25: qty 2

## 2023-04-25 MED ORDER — LACTULOSE 10 GM/15ML PO SOLN
20.0000 g | Freq: Once | ORAL | Status: AC
  Administered 2023-04-25: 20 g via ORAL

## 2023-04-25 MED ORDER — FUROSEMIDE 40 MG PO TABS
40.0000 mg | ORAL_TABLET | Freq: Two times a day (BID) | ORAL | Status: DC
  Administered 2023-04-25 – 2023-04-26 (×4): 40 mg via ORAL
  Filled 2023-04-25 (×5): qty 1

## 2023-04-25 NOTE — Plan of Care (Signed)

## 2023-04-25 NOTE — Progress Notes (Signed)
 Progress Note   Patient: Dennis Savage FMW:[id number] DOB: [birth year] DOA: 04/15/2023     9 DOS: the patient was seen and examined on 04/25/2023   Brief hospital course: Dennis Savage is a 89 y.o. male with medical history significant for hypertension, CHF, COPD, GERD, atrial fibrillation, OSA, PVD and osteoarthritis, who was just discharged 3 days ago after being managed for RSV infection and acute respiratory failure with hypoxia.  He had a worsening cough with mostly clear sputum expectoration and dyspnea with associated generalized weakness.   On presentation hemodynamically stable, labs with mild hyponatremia and CO2 of 20, blood glucose of 155, BUN 40, creatinine 1.29, BNP 613 and troponin 142>>131.  Lactic acid 3.4>>3.7, WBC 22.6 with predominant neutrophil. Chest x-ray with persistent bilateral lower lobe airspace opacities concerning for pneumonia and small bilateral pleural effusion. EKG with atrial fibrillation, controlled ventricular response and RBBB.  Patient was given cefepime  and vancomycin  along with DuoNeb and Solu-Medrol .  Received IV normal saline.  Started on Rocephin  and Zithromax . So far patient has completed antibiotics.  Respiratory symptom has improved.  Interim only, patient developed significant abdominal distention, CT abdomen/pelvis showed ileus.  Received enema 1/7, had 2 large loose stools.  Condition had improved.      Principal Problem:   Sepsis due to pneumonia [facility]) Active Problems:   Elevated troponin   Essential hypertension   Paroxysmal atrial fibrillation (HCC)   GERD without esophagitis   Chronic diastolic CHF (congestive heart failure) (HCC)   Overweight (BMI 25.0-29.9)   Thrombocytopenia (HCC)   Hypokalemia   Assessment and Plan: Severe sepsis due to pneumonia (HCC) Left lower lobe bacterial pneumonia. Patient met sepsis criteria at the time of admission with tachypnea and leukocytosis.  Severe lactic acidosis of 6.9. Condition appears  to be secondary to left lower lobe pneumonia.  This appears to be a bacterial pneumonia following the initial RSV infection. Patient so far has completed antibiotics. Patient also complaining of some dysphagia, this could be caused by RSV.  Will obtain speech therapy evaluation. Patient also has reported bilateral pleural effusion, I personally reviewed patient's CT scan image, this amount is small, more on the right side.  Not consistent with the parapneumonic effusion.   Suspected severe ileus Irritable bowel syndrome.. CT scan of the abdomen/pelvis favor ileus rather than small bowel obstruction. Patient had colonoscopy about 5 years ago, no malignancy.  He has severe constipation alternating with diarrhea for the last 5 years.  Condition more consistent with irritable bowel syndrome. Patient had an enema yesterday, had a very large loose stools x 2.  No nausea or vomiting.  Abdominal pain has resolved. I will start Linzess  in addition to MiraLAX . Patient became constipated again, give a dose of lactulose .   Elevated troponin- peaked at 142 - This is likely secondary to sepsis.  He has no chest pain, ischemia on ECG   Essential hypertension - continue home antihypertensive therapy.   Paroxysmal atrial fibrillation (HCC) Continue heparin , restart Xarelto .   Chronic kidney disease stage IIIa. Hypokalemia. Metabolic acidosis. Replete potassium again.   GERD without esophagitis - continue PPI therapy.   Chronic diastolic CHF (congestive heart failure) (HCC) Patient had a short of breath last night, some crackles in the bases.  Start Lasix  40 mg oral twice a day.   Left Adrenal mass  Right renal mass.-  Patient be referred to urology after discharge.  Thrombocytopenia. Recheck a CBC tomorrow.      Subjective:  Patient had short of  breath last night, was placed on oxygen.  Did not document any hypoxemia.  Physical Exam: Vitals:   04/24/23 2006 04/25/23 0408 04/25/23 0718  04/25/23 0734  BP:  (!) 141/68 (!) 144/69   Pulse:  (!) 53 (!) 45   Resp:  18 18   Temp:  98.2 F (36.8 C) 98 F (36.7 C)   TempSrc:      SpO2: 96% 99%  95%  Weight:      Height:       General exam: Appears calm and comfortable  Respiratory system: Crackles in the bases bilaterally. Respiratory effort normal. Cardiovascular system: S1 & S2 heard, RRR. No JVD, murmurs, rubs, gallops or clicks. No pedal edema. Gastrointestinal system: Abdomen is nondistended, soft and nontender. No organomegaly or masses felt. Normal bowel sounds heard. Central nervous system: Alert and oriented. No focal neurological deficits. Extremities: Symmetric 5 x 5 power. Skin: No rashes, lesions or ulcers Psychiatry: Judgement and insight appear normal. Mood & affect appropriate.    Data Reviewed:  Lab results reviewed.  Family Communication: Daughter updated at bedside.  Disposition: Status is: Inpatient Remains inpatient appropriate because: Severity of disease.     Time spent: 35 minutes  Author: Murvin Mana, MD 04/25/2023 12:00 PM  For on call review [email or web address].

## 2023-04-25 NOTE — Progress Notes (Signed)
 Mobility Specialist - Progress Note  (RA) Pre-mobility: HR 77, SpO2 98% During mobility: HR 105, SpO2 96% Post-mobility: HR 78, SPO2 97%   04/25/23 1004  Mobility  Activity Ambulated with assistance to bathroom;Ambulated with assistance in room;Ambulated with assistance in hallway  Level of Assistance Standby assist, set-up cues, supervision of patient - no hands on  Assistive Device Front wheel walker  Distance Ambulated (ft) 160 ft  Activity Response Tolerated well  Mobility visit 1 Mobility  Mobility Specialist Start Time (ACUTE ONLY) [id number]  Mobility Specialist Stop Time (ACUTE ONLY) 0951  Mobility Specialist Time Calculation (min) (ACUTE ONLY) 22 min   Pt simi fowler upon entry, utilizing RA. Pt willing and agreeable to OOB amb in the hallway this date. Pt completed bed mob indep, STS to RW MinA and amb to the sink in room. While at the sink Pt indep washes face and combs hair. Pt then amb to the bathroom to void, and amb one lap around the NS MinG-SBA-- O2 >90% on RA during amb. Pt returned to the room, left seated in the recliner with alarm set and needs within reach. RN and NT notified.  America Silvan Mobility Specialist 04/25/23 10:10 AM

## 2023-04-25 NOTE — TOC Progression Note (Signed)
 Transition of Care [facility]) - Progression Note    Patient Details  Name: LAAKEA PEREIRA MRN: [id number] Date of Birth: 28-Sep-1929  Transition of Care [facility]) CM/SW Contact  Ladene Lady, LCSW Phone Number: 04/25/2023, 10:17 AM  Clinical Narrative:   CSW spoke with patient pt reports he feels well enough to go home with [facility]. Reports no preference on agency.          Expected Discharge Plan and Services                                               Social Determinants of Health (SDOH) Interventions SDOH Screenings   Food Insecurity: No Food Insecurity (04/16/2023)  Housing: Low Risk  (04/16/2023)  Transportation Needs: No Transportation Needs (04/16/2023)  Utilities: Not At Risk (04/16/2023)  Depression (PHQ2-9): Low Risk  (06/30/2021)  Financial Resource Strain: Low Risk  (03/29/2023)   Received from [facility]  Social Connections: Moderately Isolated (04/16/2023)  Tobacco Use: Medium Risk (04/05/2023)    Readmission Risk Interventions    04/18/2023    1:48 PM  Readmission Risk Prevention Plan  Transportation Screening Complete  Medication Review (RN Care Manager) Complete  HRI or Home Care Consult Complete  Palliative Care Screening Not Applicable  Skilled Nursing Facility Complete

## 2023-04-26 DIAGNOSIS — J189 Pneumonia, unspecified organism: Secondary | ICD-10-CM | POA: Diagnosis not present

## 2023-04-26 DIAGNOSIS — A419 Sepsis, unspecified organism: Secondary | ICD-10-CM | POA: Diagnosis not present

## 2023-04-26 DIAGNOSIS — D696 Thrombocytopenia, unspecified: Secondary | ICD-10-CM

## 2023-04-26 DIAGNOSIS — I5033 Acute on chronic diastolic (congestive) heart failure: Secondary | ICD-10-CM | POA: Diagnosis not present

## 2023-04-26 LAB — BASIC METABOLIC PANEL
Anion gap: 11 (ref 5–15)
BUN: 14 mg/dL (ref 8–23)
CO2: 23 mmol/L (ref 22–32)
Calcium: 8.5 mg/dL — ABNORMAL LOW (ref 8.9–10.3)
Chloride: 105 mmol/L (ref 98–111)
Creatinine, Ser: 1.19 mg/dL (ref 0.61–1.24)
GFR, Estimated: 58 mL/min — ABNORMAL LOW (ref >60)
Glucose, Bld: 100 mg/dL — ABNORMAL HIGH (ref 70–99)
Potassium: 3.7 mmol/L (ref 3.5–5.1)
Sodium: 139 mmol/L (ref 135–145)

## 2023-04-26 LAB — CBC
HCT: 35.5 % — ABNORMAL LOW (ref 39.0–52.0)
Hemoglobin: 11.6 g/dL — ABNORMAL LOW (ref 13.0–17.0)
MCH: 29.4 pg (ref 26.0–34.0)
MCHC: 32.7 g/dL (ref 30.0–36.0)
MCV: 90.1 fL (ref 80.0–100.0)
Platelets: 124 10*3/uL — ABNORMAL LOW (ref 150–400)
RBC: 3.94 MIL/uL — ABNORMAL LOW (ref 4.22–5.81)
RDW: 13.5 % (ref 11.5–15.5)
WBC: 4.1 10*3/uL (ref 4.0–10.5)
nRBC: 0 % (ref 0.0–0.2)

## 2023-04-26 LAB — MAGNESIUM: Magnesium: 1.9 mg/dL (ref 1.7–2.4)

## 2023-04-26 NOTE — Progress Notes (Signed)
 Physical Therapy Treatment Patient Details Name: Dennis Savage MRN: [id number] DOB: 06-24-1929 Today's Date: 04/26/2023   History of Present Illness Pt is a 89 yo male that presented from [facility] for SOB, hypoxia, workup for sepsis due to PNA. PMH arthritis, CHF, COPD, gastric reflux, hypertension, dizziness, LBP, and PVD, R and L THA.    PT Comments  Pt able to complete x 1 lap on unit with RW and cga/supervision.  No LOB or safety concerns.  Does stop in bathroom and needs min a to get up for low commode. Stated he has riser at home and does well with no difficulty.  Progressing well with mobility and discharge reccommendations updated to reflect.     If plan is discharge home, recommend the following: Assistance with cooking/housework;Direct supervision/assist for medications management;Assist for transportation;Help with stairs or ramp for entrance;A little help with walking and/or transfers;A little help with bathing/dressing/bathroom   Can travel by private vehicle        Equipment Recommendations  None recommended by PT    Recommendations for Other Services       Precautions / Restrictions Precautions Precautions: Fall Restrictions Weight Bearing Restrictions Per Provider Order: No Other Position/Activity Restrictions: watch O2 sats     Mobility  Bed Mobility               General bed mobility comments: on EOB with nursing staff before and after session Patient Response: Cooperative  Transfers Overall transfer level: Modified independent Equipment used: Rolling walker (2 wheels) Transfers: Sit to/from Stand Sit to Stand: Supervision                Ambulation/Gait Ambulation/Gait assistance: Contact guard assist, Supervision Gait Distance (Feet): 200 Feet Assistive device: Rolling walker (2 wheels) Gait Pattern/deviations: Step-through pattern           Stairs             Wheelchair Mobility     Tilt Bed Tilt Bed Patient Response:  Cooperative  Modified Rankin (Stroke Patients Only)       Balance Overall balance assessment: Needs assistance Sitting-balance support: Feet supported Sitting balance-Leahy Scale: Normal     Standing balance support: Bilateral upper extremity supported, Reliant on assistive device for balance, During functional activity Standing balance-Leahy Scale: Good                              Cognition Arousal: Alert Behavior During Therapy: WFL for tasks assessed/performed Overall Cognitive Status: Within Functional Limits for tasks assessed                                 General Comments: HOH but oriented        Exercises      General Comments        Pertinent Vitals/Pain Pain Assessment Pain Assessment: No/denies pain    Home Living                          Prior Function            PT Goals (current goals can now be found in the care plan section) Progress towards PT goals: Progressing toward goals    Frequency    Min 1X/week      PT Plan      Co-evaluation  AM-PAC PT "6 Clicks" Mobility   Outcome Measure  Help needed turning from your back to your side while in a flat bed without using bedrails?: None Help needed moving from lying on your back to sitting on the side of a flat bed without using bedrails?: None Help needed moving to and from a bed to a chair (including a wheelchair)?: None Help needed standing up from a chair using your arms (e.g., wheelchair or bedside chair)?: None Help needed to walk in hospital room?: A Little Help needed climbing 3-5 steps with a railing? : A Little 6 Click Score: 22    End of Session Equipment Utilized During Treatment: Gait belt Activity Tolerance: Patient tolerated treatment well Patient left: in bed;with call bell/phone within reach;with nursing/sitter in room;with family/visitor present Nurse Communication: Mobility status PT Visit Diagnosis:  Difficulty in walking, not elsewhere classified (R26.2);Muscle weakness (generalized) (M62.81)     Time: 6967-8938 PT Time Calculation (min) (ACUTE ONLY): 8 min  Charges:    $Gait Training: 8-22 mins PT General Charges $$ ACUTE PT VISIT: 1 Visit

## 2023-04-26 NOTE — Progress Notes (Signed)
 Occupational Therapy Treatment Patient Details Name: Dennis Savage MRN: [id number] DOB: 07/18/1929 Today's Date: 04/26/2023   History of present illness Pt is a 89 yo male that presented from [facility] for SOB, hypoxia, workup for sepsis due to PNA. PMH arthritis, CHF, COPD, gastric reflux, hypertension, dizziness, LBP, and PVD, R and L THA.   OT comments  Pt seen for OT tx this date. Pt just returned to bed after transferring to/from the [facility] for toileting by himself per pt report. Pt denied difficulty. Pt completed bed mobility with modified independence and tolerated sitting EOB to shave his face with his personal shaving items. Denied SOB throughout. Pt educated in IS and acapella to improve his breathing and was able to return demo proper technique. Pt progressing well towards goals. Continues to benefit.       If plan is discharge home, recommend the following:  A little help with walking and/or transfers;A little help with bathing/dressing/bathroom;Assistance with cooking/housework;Assist for transportation   Equipment Recommendations  BSC/3in1    Recommendations for Other Services      Precautions / Restrictions Precautions Precautions: Fall Restrictions Weight Bearing Restrictions Per Provider Order: No Other Position/Activity Restrictions: watch O2 sats       Mobility Bed Mobility Overal bed mobility: Modified Independent Bed Mobility: Supine to Sit, Sit to Supine     Supine to sit: Modified independent (Device/Increase time) Sit to supine: Modified independent (Device/Increase time)        Transfers                         Balance Overall balance assessment: Needs assistance Sitting-balance support: Feet supported Sitting balance-Leahy Scale: Normal                                     ADL either performed or assessed with clinical judgement   ADL Overall ADL's : Needs assistance/impaired     Grooming: Sitting;Wash/dry face Grooming  Details (indicate cue type and reason): pt sat EOB to wash his face and shave using personal shaving supplies. Denied SOB, tolerated sitting to complete task.                 Toilet Transfer: Modified Control And Instrumentation Engineer Details (indicate cue type and reason): pt reported getting to/from [facility] just prior to OT session by himself, BSC next to bed Toileting- Clothing Manipulation and Hygiene: Modified independent;Sitting/lateral lean              Extremity/Trunk Assessment              Vision       Perception     Praxis      Cognition Arousal: Alert Behavior During Therapy: WFL for tasks assessed/performed Overall Cognitive Status: Within Functional Limits for tasks assessed                                 General Comments: HOH but oriented        Exercises Other Exercises Other Exercises: Pt educated in use of IS and acapella to support improved breathing. pt able to return demo proper technique    Shoulder Instructions       General Comments      Pertinent Vitals/ Pain       Pain Assessment Pain Assessment: No/denies pain  Home Living  Prior Functioning/Environment              Frequency  Min 1X/week        Progress Toward Goals  OT Goals(current goals can now be found in the care plan section)  Progress towards OT goals: Progressing toward goals  Acute Rehab OT Goals Patient Stated Goal: get better OT Goal Formulation: With patient Time For Goal Achievement: 05/01/23 Potential to Achieve Goals: Good  Plan      Co-evaluation                 AM-PAC OT 6 Clicks Daily Activity     Outcome Measure   Help from another person eating meals?: None Help from another person taking care of personal grooming?: None Help from another person toileting, which includes using toliet, bedpan, or urinal?: None Help from another person bathing  (including washing, rinsing, drying)?: A Little Help from another person to put on and taking off regular upper body clothing?: None Help from another person to put on and taking off regular lower body clothing?: A Little 6 Click Score: 22    End of Session    OT Visit Diagnosis: Unsteadiness on feet (R26.81);Muscle weakness (generalized) (M62.81)   Activity Tolerance Patient tolerated treatment well   Patient Left in bed;with call bell/phone within reach   Nurse Communication          Time: 8665-8649 OT Time Calculation (min): 16 min  Charges: OT General Charges $OT Visit: 1 Visit OT Treatments $Self Care/Home Management : 8-22 mins  Warren SAUNDERS., MPH, MS, OTR/L ascom [phone] 04/26/23, 2:34 PM

## 2023-04-26 NOTE — Plan of Care (Signed)
   Problem: Fluid Volume: Goal: Hemodynamic stability will improve Outcome: Progressing   Problem: Clinical Measurements: Goal: Diagnostic test results will improve Outcome: Progressing Goal: Signs and symptoms of infection will decrease Outcome: Progressing   Problem: Respiratory: Goal: Ability to maintain adequate ventilation will improve Outcome: Progressing

## 2023-04-26 NOTE — Plan of Care (Signed)

## 2023-04-26 NOTE — Progress Notes (Signed)
 Progress Note   Patient: Dennis Savage FMW:[id number] DOB: August 13, 1929 DOA: 04/15/2023     10 DOS: the patient was seen and examined on 04/26/2023   Brief hospital course: VAIBHAV FOGLEMAN is a 89 y.o. male with medical history significant for hypertension, CHF, COPD, GERD, atrial fibrillation, OSA, PVD and osteoarthritis, who was just discharged 3 days ago after being managed for RSV infection and acute respiratory failure with hypoxia.  He had a worsening cough with mostly clear sputum expectoration and dyspnea with associated generalized weakness.   On presentation hemodynamically stable, labs with mild hyponatremia and CO2 of 20, blood glucose of 155, BUN 40, creatinine 1.29, BNP 613 and troponin 142>>131.  Lactic acid 3.4>>3.7, WBC 22.6 with predominant neutrophil. Chest x-ray with persistent bilateral lower lobe airspace opacities concerning for pneumonia and small bilateral pleural effusion. EKG with atrial fibrillation, controlled ventricular response and RBBB.  Patient was given cefepime  and vancomycin  along with DuoNeb and Solu-Medrol .  Received IV normal saline.  Started on Rocephin  and Zithromax . So far patient has completed antibiotics.  Respiratory symptom has improved.  patient developed significant abdominal distention, CT abdomen/pelvis showed ileus.  Received enema 1/7, had 2 large loose stools.  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  Patient developed worsening cough and short of breath at night of 1/8, was started on oral Lasix  for exacerbation congestive heart failure.        Principal Problem:   Sepsis due to pneumonia [facility]) Active Problems:   Elevated troponin   Essential hypertension   Paroxysmal atrial fibrillation (HCC)   GERD without esophagitis   Chronic diastolic CHF (congestive heart failure) (HCC)   Overweight (BMI 25.0-29.9)   Thrombocytopenia (HCC)   Hypokalemia   Assessment and Plan:  Severe sepsis due to pneumonia (HCC) Left  lower lobe bacterial pneumonia. Patient met sepsis criteria at the time of admission with tachypnea and leukocytosis.  Severe lactic acidosis of 6.9. Condition appears to be secondary to left lower lobe pneumonia.  This appears to be a bacterial pneumonia following the initial RSV infection. Patient so far has completed antibiotics. Patient also complaining of some dysphagia, this could be caused by RSV.  Will obtain speech therapy evaluation. Patient also has reported bilateral pleural effusion, I personally reviewed patient's CT scan image, this amount is small, more on the right side.  Not consistent with the parapneumonic effusion. Condition has improved.   Suspected severe ileus Irritable bowel syndrome.. CT scan of the abdomen/pelvis favor ileus rather than small bowel obstruction. Patient had colonoscopy about 5 years ago, no malignancy.  He has severe constipation alternating with diarrhea for the last 5 years.  Condition more consistent with irritable bowel syndrome. Patient had an enema yesterday, had a very large loose stools x 2.  No nausea or vomiting.  Abdominal pain has resolved. I will start Linzess  in addition to MiraLAX . Patient became constipated again, received a dose of lactulose  1/9. Continue to follow.    Acute on chronic diastolic CHF (congestive heart failure) (HCC) Patient had a worsening short of breath on the night of 1/8, examination showed significant crackle in the base.  Condition is consistent with exacerbation congestive heart failure.  He was started on oral Lasix  40 mg twice a day on 1/9.  Short of breath and cough was better last night.  Continue oral Lasix .  Monitor electrolytes.   Elevated troponin- peaked at 142 - This is likely secondary to sepsis.  He has no chest pain, ischemia on  ECG   Essential hypertension - continue home antihypertensive therapy.   Paroxysmal atrial fibrillation (HCC) Continue heparin , restarted Xarelto .   Chronic kidney  disease stage IIIa. Hypokalemia. Metabolic acidosis. Replete potassium again.   GERD without esophagitis - continue PPI therapy.    Left Adrenal mass  Right renal mass.-  Discussed with the patient and daughter, patient has a known right renal mass, followed by Dr. Penne.  Will go back to Dr. Penne after discharge.   Thrombocytopenia. Appear to be secondary to infection, condition improving.     Subjective:  Patient slept better last night, had some cough overnight but, but much improved compared to yesterday. He had small loose stools yesterday, no nausea vomiting.  Physical Exam: Vitals:   04/25/23 1925 04/25/23 2026 04/26/23 0423 04/26/23 0727  BP: (!) 147/63  129/64 (!) 143/69  Pulse: (!) 57  65 (!) 53  Resp: 18  18 16   Temp: 98.6 F (37 C)  97.8 F (36.6 C) 97.9 F (36.6 C)  TempSrc:    Oral  SpO2: 98% 95% 94% 91%  Weight:      Height:       General exam: Appears calm and comfortable  Respiratory system: Crackles much improved. Respiratory effort normal. Cardiovascular system: S1 & S2 heard, RRR. No JVD, murmurs, rubs, gallops or clicks. No pedal edema. Gastrointestinal system: Abdomen is nondistended, soft and nontender. No organomegaly or masses felt. Normal bowel sounds heard. Central nervous system: Alert and oriented. No focal neurological deficits. Extremities: Symmetric 5 x 5 power. Skin: No rashes, lesions or ulcers Psychiatry: Judgement and insight appear normal. Mood & affect appropriate.    Data Reviewed:  Reviewed lab results.  Family Communication: Daughter updated at bedside.  Disposition: Status is: Inpatient Remains inpatient appropriate because: Severity of disease.     Time spent: 50 minutes  Author: Murvin Mana, MD 04/26/2023 11:15 AM  For on call review [email or web address].

## 2023-04-27 DIAGNOSIS — I5033 Acute on chronic diastolic (congestive) heart failure: Secondary | ICD-10-CM | POA: Diagnosis not present

## 2023-04-27 DIAGNOSIS — A419 Sepsis, unspecified organism: Secondary | ICD-10-CM | POA: Diagnosis not present

## 2023-04-27 DIAGNOSIS — I480 Paroxysmal atrial fibrillation: Secondary | ICD-10-CM | POA: Diagnosis not present

## 2023-04-27 DIAGNOSIS — J189 Pneumonia, unspecified organism: Secondary | ICD-10-CM | POA: Diagnosis not present

## 2023-04-27 LAB — BASIC METABOLIC PANEL
Anion gap: 8 (ref 5–15)
BUN: 14 mg/dL (ref 8–23)
CO2: 28 mmol/L (ref 22–32)
Calcium: 8.7 mg/dL — ABNORMAL LOW (ref 8.9–10.3)
Chloride: 104 mmol/L (ref 98–111)
Creatinine, Ser: 1.13 mg/dL (ref 0.61–1.24)
GFR, Estimated: >60 mL/min (ref >60)
Glucose, Bld: 99 mg/dL (ref 70–99)
Potassium: 3.5 mmol/L (ref 3.5–5.1)
Sodium: 140 mmol/L (ref 135–145)

## 2023-04-27 LAB — MAGNESIUM: Magnesium: 1.8 mg/dL (ref 1.7–2.4)

## 2023-04-27 MED ORDER — LACTULOSE 10 GM/15ML PO SOLN
20.0000 g | Freq: Once | ORAL | Status: AC
  Administered 2023-04-27: 20 g via ORAL
  Filled 2023-04-27: qty 30

## 2023-04-27 MED ORDER — POTASSIUM CHLORIDE CRYS ER 20 MEQ PO TBCR
40.0000 meq | EXTENDED_RELEASE_TABLET | Freq: Once | ORAL | Status: AC
  Administered 2023-04-27: 40 meq via ORAL
  Filled 2023-04-27: qty 2

## 2023-04-27 MED ORDER — FUROSEMIDE 10 MG/ML IJ SOLN
40.0000 mg | Freq: Two times a day (BID) | INTRAMUSCULAR | Status: DC
  Administered 2023-04-27 (×2): 40 mg via INTRAVENOUS
  Filled 2023-04-27 (×3): qty 4

## 2023-04-27 MED ORDER — IPRATROPIUM-ALBUTEROL 0.5-2.5 (3) MG/3ML IN SOLN: 3.0000 mL | RESPIRATORY_TRACT | Status: DC | PRN

## 2023-04-27 NOTE — Plan of Care (Signed)

## 2023-04-27 NOTE — Plan of Care (Signed)
  Problem: Respiratory: Goal: Ability to maintain adequate ventilation will improve Outcome: Progressing   Problem: Clinical Measurements: Goal: Cardiovascular complication will be avoided Outcome: Progressing   Problem: Activity: Goal: Risk for activity intolerance will decrease Outcome: Progressing   Problem: Nutrition: Goal: Adequate nutrition will be maintained Outcome: Progressing

## 2023-04-27 NOTE — Progress Notes (Signed)
 Progress Note   Patient: Dennis Savage FMW:[id number] DOB: [birth year] DOA: 04/15/2023     11 DOS: the patient was seen and examined on 04/27/2023   Brief hospital course: JHONY ANTRIM is a 89 y.o. male with medical history significant for hypertension, CHF, COPD, GERD, atrial fibrillation, OSA, PVD and osteoarthritis, who was just discharged 3 days ago after being managed for RSV infection and acute respiratory failure with hypoxia.  He had a worsening cough with mostly clear sputum expectoration and dyspnea with associated generalized weakness.   On presentation hemodynamically stable, labs with mild hyponatremia and CO2 of 20, blood glucose of 155, BUN 40, creatinine 1.29, BNP 613 and troponin 142>>131.  Lactic acid 3.4>>3.7, WBC 22.6 with predominant neutrophil. Chest x-ray with persistent bilateral lower lobe airspace opacities concerning for pneumonia and small bilateral pleural effusion. EKG with atrial fibrillation, controlled ventricular response and RBBB.  Patient was given cefepime  and vancomycin  along with DuoNeb and Solu-Medrol .  Received IV normal saline.  Started on Rocephin  and Zithromax . So far patient has completed antibiotics.  Respiratory symptom has improved.  patient developed significant abdominal distention, CT abdomen/pelvis showed ileus.  Received enema 1/7, had 2 large loose stools.  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  Patient developed worsening cough and short of breath at night of 1/8, was started on oral Lasix  for exacerbation congestive heart failure.        Principal Problem:   Sepsis due to pneumonia [facility]) Active Problems:   Elevated troponin   Essential hypertension   Paroxysmal atrial fibrillation (HCC)   GERD without esophagitis   Chronic diastolic CHF (congestive heart failure) (HCC)   Overweight (BMI 25.0-29.9)   Thrombocytopenia (HCC)   Hypokalemia   Assessment and Plan:  Severe sepsis due to pneumonia (HCC) Left  lower lobe bacterial pneumonia. Patient met sepsis criteria at the time of admission with tachypnea and leukocytosis.  Severe lactic acidosis of 6.9. Condition appears to be secondary to left lower lobe pneumonia.  This appears to be a bacterial pneumonia following the initial RSV infection. Patient so far has completed antibiotics. Patient also complaining of some dysphagia, this could be caused by RSV.  Will obtain speech therapy evaluation. Patient also has reported bilateral pleural effusion, I personally reviewed patient's CT scan image, this amount is small, more on the right side.  Not consistent with the parapneumonic effusion. Condition has improved.   Suspected severe ileus Irritable bowel syndrome.. CT scan of the abdomen/pelvis favor ileus rather than small bowel obstruction. Patient had colonoscopy about 5 years ago, no malignancy.  He has severe constipation alternating with diarrhea for the last 5 years.  Condition more consistent with irritable bowel syndrome. Patient had an enema yesterday, had a very large loose stools x 2.  No nausea or vomiting.  Abdominal pain has resolved. I will start Linzess  in addition to MiraLAX . Patient became constipated again, received a dose of lactulose  1/9. Patient is still complaining constipation today, will give another dose of lactulose  while continued on Linzess  and MiraLAX .     Acute on chronic diastolic CHF (congestive heart failure) (HCC) Patient had a worsening short of breath on the night of 1/8, examination showed significant crackle in the base.  Condition is consistent with exacerbation congestive heart failure.  He was started on oral Lasix  40 mg twice a day on 1/9.   His short of breath and cough was better the night, but examination showed increased bilateral crackles, I will change Lasix  to  40 mg IV twice a day.  Monitor electrolytes and renal function.   Elevated troponin- peaked at 142 - This is likely secondary to sepsis.  He  has no chest pain, ischemia on ECG   Essential hypertension - continue home antihypertensive therapy.   Paroxysmal atrial fibrillation (HCC) Continue heparin , restarted Xarelto .   Chronic kidney disease stage IIIa. Hypokalemia. Metabolic acidosis. Replete potassium again.   GERD without esophagitis - continue PPI therapy.     Left Adrenal mass  Right renal mass.-  Discussed with the patient and daughter, patient has a known right renal mass, followed by Dr. Penne.  Will go back to Dr. Penne after discharge.   Thrombocytopenia. Appear to be secondary to infection, condition improving.       Subjective:  Patient did not have significant short of breath and cough last night.  He slept better. He feels constipated again.  On he had a very small bowel movement after lactulose  1/9  Physical Exam: Vitals:   04/26/23 1931 04/27/23 0409 04/27/23 0747 04/27/23 0842  BP: (!) 141/62 (!) 168/96  128/62  Pulse: 62 68  63  Resp:  18    Temp: 98.1 F (36.7 C) 97.7 F (36.5 C)  97.6 F (36.4 C)  TempSrc:  Oral    SpO2: 93% 94% 92% 92%  Weight:      Height:       General exam: Appears calm and comfortable  Respiratory system: Crackles in the bases bilaterally. Respiratory effort normal. Cardiovascular system: S1 & S2 heard, RRR. No JVD, murmurs, rubs, gallops or clicks. No pedal edema. Gastrointestinal system: Abdomen is nondistended, soft and nontender. No organomegaly or masses felt. Normal bowel sounds heard. Central nervous system: Alert and oriented. No focal neurological deficits. Extremities: Symmetric 5 x 5 power. Skin: No rashes, lesions or ulcers Psychiatry: Judgement and insight appear normal. Mood & affect appropriate.    Data Reviewed:  Lab results reviewed.  Family Communication: None  Disposition: Status is: Inpatient Remains inpatient appropriate because: Severity of disease, IV treatment.     Time spent: 35 minutes  Author: Murvin Mana,  MD 04/27/2023 10:11 AM  For on call review [email or web address].

## 2023-04-28 ENCOUNTER — Inpatient Hospital Stay: Payer: Medicare Other

## 2023-04-28 DIAGNOSIS — J189 Pneumonia, unspecified organism: Secondary | ICD-10-CM | POA: Diagnosis not present

## 2023-04-28 DIAGNOSIS — I480 Paroxysmal atrial fibrillation: Secondary | ICD-10-CM | POA: Diagnosis not present

## 2023-04-28 DIAGNOSIS — I5033 Acute on chronic diastolic (congestive) heart failure: Secondary | ICD-10-CM | POA: Diagnosis not present

## 2023-04-28 DIAGNOSIS — A419 Sepsis, unspecified organism: Secondary | ICD-10-CM | POA: Diagnosis not present

## 2023-04-28 LAB — BASIC METABOLIC PANEL
Anion gap: 11 (ref 5–15)
BUN: 17 mg/dL (ref 8–23)
CO2: 26 mmol/L (ref 22–32)
Calcium: 8.5 mg/dL — ABNORMAL LOW (ref 8.9–10.3)
Chloride: 100 mmol/L (ref 98–111)
Creatinine, Ser: 1.34 mg/dL — ABNORMAL HIGH (ref 0.61–1.24)
GFR, Estimated: 50 mL/min — ABNORMAL LOW (ref >60)
Glucose, Bld: 105 mg/dL — ABNORMAL HIGH (ref 70–99)
Potassium: 3.5 mmol/L (ref 3.5–5.1)
Sodium: 137 mmol/L (ref 135–145)

## 2023-04-28 LAB — MAGNESIUM: Magnesium: 1.8 mg/dL (ref 1.7–2.4)

## 2023-04-28 MED ORDER — TORSEMIDE 20 MG PO TABS
20.0000 mg | ORAL_TABLET | Freq: Every day | ORAL | Status: DC
  Administered 2023-04-28: 20 mg via ORAL
  Filled 2023-04-28: qty 1

## 2023-04-28 MED ORDER — POTASSIUM CHLORIDE CRYS ER 20 MEQ PO TBCR
40.0000 meq | EXTENDED_RELEASE_TABLET | Freq: Once | ORAL | Status: AC
  Administered 2023-04-28: 40 meq via ORAL
  Filled 2023-04-28: qty 2

## 2023-04-28 NOTE — Progress Notes (Signed)
 Mobility Specialist - Progress Note     04/28/23 1400  Mobility  Activity Stood at bedside;Dangled on edge of bed;Ambulated independently in hallway  Level of Assistance Modified independent, requires aide device or extra time  Assistive Device Front wheel walker  Distance Ambulated (ft) 160 ft  Range of Motion/Exercises Active  Activity Response Tolerated well  Mobility Referral Yes  Mobility visit 1 Mobility  Mobility Specialist Start Time (ACUTE ONLY) 1321  Mobility Specialist Stop Time (ACUTE ONLY) 1336  Mobility Specialist Time Calculation (min) (ACUTE ONLY) 15 min   Pt sleeping and aroused to sound on RA upon entry. Pt STS and ambulates to hallway around NS ModI with RW. Pt returned to bed and left with needs in reach bed alarm activated.   Guido Rumble Mobility Specialist 04/28/23, 2:07 PM

## 2023-04-28 NOTE — Plan of Care (Signed)
   Problem: Education: Goal: Knowledge of General Education information will improve Description Including pain rating scale, medication(s)/side effects and non-pharmacologic comfort measures Outcome: Progressing   Problem: Health Behavior/Discharge Planning: Goal: Ability to manage health-related needs will improve Outcome: Progressing

## 2023-04-28 NOTE — Progress Notes (Signed)
 Progress Note   Patient: Dennis Savage FMW:[id number] DOB: June 30, 1929 DOA: 04/15/2023     12 DOS: the patient was seen and examined on 04/28/2023   Brief hospital course: Dennis Savage is a 89 y.o. male with medical history significant for hypertension, CHF, COPD, GERD, atrial fibrillation, OSA, PVD and osteoarthritis, who was just discharged 3 days ago after being managed for RSV infection and acute respiratory failure with hypoxia.  He had a worsening cough with mostly clear sputum expectoration and dyspnea with associated generalized weakness.   On presentation hemodynamically stable, labs with mild hyponatremia and CO2 of 20, blood glucose of 155, BUN 40, creatinine 1.29, BNP 613 and troponin 142>>131.  Lactic acid 3.4>>3.7, WBC 22.6 with predominant neutrophil. Chest x-ray with persistent bilateral lower lobe airspace opacities concerning for pneumonia and small bilateral pleural effusion. EKG with atrial fibrillation, controlled ventricular response and RBBB.  Patient was given cefepime  and vancomycin  along with DuoNeb and Solu-Medrol .  Received IV normal saline.  Started on Rocephin  and Zithromax . So far patient has completed antibiotics.  Respiratory symptom has improved.  patient developed significant abdominal distention, CT abdomen/pelvis showed ileus.  Received enema 1/7, had 2 large loose stools.  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  Patient developed worsening cough and short of breath at night of 1/8, was started on oral Lasix  for exacerbation congestive heart failure.        Principal Problem:   Sepsis due to pneumonia [facility]) Active Problems:   Elevated troponin   Acute on chronic diastolic CHF (congestive heart failure) (HCC)   Essential hypertension   Paroxysmal atrial fibrillation (HCC)   GERD without esophagitis   Chronic diastolic CHF (congestive heart failure) (HCC)   Overweight (BMI 25.0-29.9)   Thrombocytopenia (HCC)    Hypokalemia   Assessment and Plan:  Severe sepsis due to pneumonia (HCC) Left lower lobe bacterial pneumonia. Patient met sepsis criteria at the time of admission with tachypnea and leukocytosis.  Severe lactic acidosis of 6.9. Condition appears to be secondary to left lower lobe pneumonia.  This appears to be a bacterial pneumonia following the initial RSV infection. Patient so far has completed antibiotics. Patient also complaining of some dysphagia, this could be caused by RSV.  Will obtain speech therapy evaluation. Patient also has reported bilateral pleural effusion, I personally reviewed patient's CT scan image, this amount is small, more on the right side.  Not consistent with the parapneumonic effusion. Condition has improved.   Suspected severe ileus Irritable bowel syndrome.. CT scan of the abdomen/pelvis favor ileus rather than small bowel obstruction. Patient had colonoscopy about 5 years ago, no malignancy.  He has severe constipation alternating with diarrhea for the last 5 years.  Condition more consistent with irritable bowel syndrome. Patient had an enema yesterday, had a very large loose stools x 2.  No nausea or vomiting.  Abdominal pain has resolved. I will start Linzess  in addition to MiraLAX . Patient has been receiving as needed lactulose .  Condition overall has improved.     Acute on chronic diastolic CHF (congestive heart failure) (HCC) Patient had a worsening short of breath on the night of 1/8, examination showed significant crackle in the base.  Condition is consistent with exacerbation congestive heart failure.  He was started on oral Lasix  40 mg twice a day on 1/9.   His short of breath and cough was better the night, but examination showed increased bilateral crackles, changed to IV Lasix  1/11.  Volume status much better  today. Based on my examination, patient still has crackles in the right lower lobe, chest x-ray also showed a right lower lobe airspace  disease, but overall improved aeration.  I suspect the patient continue to have intermittent aspiration, will obtain barium esophagram. Change diuretics to torsemide .   Elevated troponin- peaked at 142 - This is likely secondary to sepsis.  He has no chest pain, ischemia on ECG   Essential hypertension - continue home antihypertensive therapy.   Paroxysmal atrial fibrillation (HCC) Continue heparin , restarted Xarelto .   Chronic kidney disease stage IIIa. Hypokalemia. Metabolic acidosis. Replete potassium again.   GERD without esophagitis - continue PPI therapy.     Left Adrenal mass  Right renal mass.-  Discussed with the patient and daughter, patient has a known right renal mass, followed by Dr. Penne.  Will go back to Dr. Penne after discharge.   Thrombocytopenia. Appear to be secondary to infection, condition improving.       Subjective:  Patient feels better, did not short of breath last night.  Could not sleep due to multiple urination.  Physical Exam: Vitals:   04/27/23 2043 04/28/23 0538 04/28/23 0717 04/28/23 0747  BP:  129/62 139/67   Pulse:  (!) 48 62   Resp:  18 14   Temp:  97.7 F (36.5 C) 98 F (36.7 C)   TempSrc:      SpO2: 96% 95% 97% 95%  Weight:      Height:       General exam: Appears calm and comfortable  Respiratory system: Crackles in the right base.  Respiratory effort normal. Cardiovascular system: S1 & S2 heard, RRR. No JVD, murmurs, rubs, gallops or clicks. No pedal edema. Gastrointestinal system: Abdomen is nondistended, soft and nontender. No organomegaly or masses felt. Normal bowel sounds heard. Central nervous system: Alert and oriented. No focal neurological deficits. Extremities: Symmetric 5 x 5 power. Skin: No rashes, lesions or ulcers Psychiatry: Judgement and insight appear normal. Mood & affect appropriate.    Data Reviewed:  Lab results reviewed.  Family Communication: None  Disposition: Status is:  Inpatient Remains inpatient appropriate because: Severity of disease.     Time spent: 35 minutes  Author: Murvin Mana, MD 04/28/2023 1:07 PM  For on call review [email or web address].

## 2023-04-29 ENCOUNTER — Inpatient Hospital Stay: Payer: Medicare Other

## 2023-04-29 DIAGNOSIS — I480 Paroxysmal atrial fibrillation: Secondary | ICD-10-CM | POA: Diagnosis not present

## 2023-04-29 DIAGNOSIS — A419 Sepsis, unspecified organism: Secondary | ICD-10-CM | POA: Diagnosis not present

## 2023-04-29 DIAGNOSIS — J189 Pneumonia, unspecified organism: Secondary | ICD-10-CM | POA: Diagnosis not present

## 2023-04-29 DIAGNOSIS — I5033 Acute on chronic diastolic (congestive) heart failure: Secondary | ICD-10-CM | POA: Diagnosis not present

## 2023-04-29 LAB — BASIC METABOLIC PANEL
Anion gap: 10 (ref 5–15)
Anion gap: 10 (ref 5–15)
BUN: 19 mg/dL (ref 8–23)
BUN: 21 mg/dL (ref 8–23)
CO2: 29 mmol/L (ref 22–32)
CO2: 30 mmol/L (ref 22–32)
Calcium: 8.6 mg/dL — ABNORMAL LOW (ref 8.9–10.3)
Calcium: 9.3 mg/dL (ref 8.9–10.3)
Chloride: 102 mmol/L (ref 98–111)
Chloride: 102 mmol/L (ref 98–111)
Creatinine, Ser: 1.52 mg/dL — ABNORMAL HIGH (ref 0.61–1.24)
Creatinine, Ser: 1.5 mg/dL — ABNORMAL HIGH (ref 0.61–1.24)
GFR, Estimated: 43 mL/min — ABNORMAL LOW (ref >60)
GFR, Estimated: 44 mL/min — ABNORMAL LOW (ref >60)
Glucose, Bld: 95 mg/dL (ref 70–99)
Glucose, Bld: 90 mg/dL (ref 70–99)
Potassium: 3.8 mmol/L (ref 3.5–5.1)
Potassium: 4.3 mmol/L (ref 3.5–5.1)
Sodium: 141 mmol/L (ref 135–145)
Sodium: 142 mmol/L (ref 135–145)

## 2023-04-29 LAB — PROCALCITONIN: Procalcitonin: <0.1 ng/mL

## 2023-04-29 MED ORDER — POLYETHYLENE GLYCOL 3350 17 G PO PACK: 17.0000 g | PACK | Freq: Every day | ORAL | 0 refills | Status: AC

## 2023-04-29 MED ORDER — LACTULOSE 20 GM/30ML PO SOLN: 30.0000 mL | Freq: Every day | ORAL | 0 refills | Status: AC | PRN

## 2023-04-29 MED ORDER — FUROSEMIDE 20 MG PO TABS: 20.0000 mg | ORAL_TABLET | Freq: Every day | ORAL | 0 refills | Status: DC

## 2023-04-29 MED ORDER — LINACLOTIDE 145 MCG PO CAPS: 145.0000 ug | ORAL_CAPSULE | Freq: Every day | ORAL | 0 refills | Status: DC

## 2023-04-29 NOTE — Progress Notes (Signed)
 Patient resting in bed with eyes closed. No needs identified.

## 2023-04-29 NOTE — Evaluation (Signed)
 Occupational Therapy Evaluation Patient Details Name: Dennis Savage MRN: [id number] DOB: Sep 29, 1929 Today's Date: 04/29/2023   History of Present Illness Pt is a 89 yo male that presented from [facility] for SOB, hypoxia, workup for sepsis due to PNA. PMH arthritis, CHF, COPD, gastric reflux, hypertension, dizziness, LBP, and PVD, R and L THA.   Clinical Impression   Pt seen for OT re-evaluation this date. Pt making good progress towards goals however continues to be somewhat hindered by decreased activity tolerance, strength, and balance. Pt declined OOB 2/2 having recently been up for a walk and to the bathroom per pt report but agreeable to bedside ADL. Pt required MOD A to sit EOB, increased assist from previous sessions. Pt tolerated sitting EOB for grooming tasks with good sitting balance for . Denied SOB. Pt did not require assist to return to supine at end. Pt verbalized good understanding and continued use of incentive spirometer and acapella for breathing. Pt continues to benefit from skilled OT services in order to maximize return to PLOF. Goals reviewed and remain appropriate.       If plan is discharge home, recommend the following: A little help with walking and/or transfers;A little help with bathing/dressing/bathroom;Assistance with cooking/housework;Assist for transportation    Functional Status Assessment  Patient has had a recent decline in their functional status and demonstrates the ability to make significant improvements in function in a reasonable and predictable amount of time.  Equipment Recommendations  BSC/3in1    Recommendations for Other Services       Precautions / Restrictions Precautions Precautions: Fall Restrictions Weight Bearing Restrictions Per Provider Order: No      Mobility Bed Mobility Overal bed mobility: Needs Assistance Bed Mobility: Supine to Sit, Sit to Supine     Supine to sit: Mod assist, HOB elevated Sit to supine: Modified  independent (Device/Increase time)   General bed mobility comments: increased assist to get OOB today despite HOB elevated but able to get himself back in bed    Transfers                   General transfer comment: declined      Balance Overall balance assessment: Needs assistance Sitting-balance support: Feet supported Sitting balance-Leahy Scale: Good                                     ADL either performed or assessed with clinical judgement   ADL Overall ADL's : Needs assistance/impaired     Grooming: Sitting;Wash/dry face;Oral care;Set up;Supervision/safety Grooming Details (indicate cue type and reason): setup for seated grooming including brushing teeth, washing face, and shaving his face. Pt declined to stand at the sink to complete.                                     Vision         Perception         Praxis         Pertinent Vitals/Pain Pain Assessment Pain Assessment: No/denies pain     Extremity/Trunk Assessment Upper Extremity Assessment Upper Extremity Assessment: Generalized weakness   Lower Extremity Assessment Lower Extremity Assessment: Generalized weakness       Communication Communication Communication: Hearing impairment   Cognition Arousal: Alert Behavior During Therapy: WFL for tasks assessed/performed Overall Cognitive Status: Within Functional  Limits for tasks assessed                                       General Comments       Exercises     Shoulder Instructions      Home Living Family/patient expects to be discharged to:: Private residence Living Arrangements: Children (daughter, son in social worker, grandchildren) Available Help at Discharge: Available PRN/intermittently Type of Home: House Home Access: Stairs to enter Secretary/administrator of Steps: 1   Home Layout: One level     Bathroom Shower/Tub: Chief Strategy Officer: Standard     Home  Equipment: Rollator (4 wheels);Cane - single point;Wheelchair - manual;Grab bars - tub/shower;Shower seat;Tub bench;Toilet riser          Prior Functioning/Environment Prior Level of Function : Needs assist             Mobility Comments: MOD I with SPC or rollator at baseline ADLs Comments: MOD I-I in ADL, assist with IADLs- driving, shopping, cooking        OT Problem List: Decreased strength;Decreased activity tolerance;Impaired balance (sitting and/or standing);Decreased knowledge of use of DME or AE;Cardiopulmonary status limiting activity      OT Treatment/Interventions: Self-care/ADL training;Therapeutic exercise;Patient/family education;Balance training;Energy conservation;Therapeutic activities;DME and/or AE instruction    OT Goals(Current goals can be found in the care plan section) Acute Rehab OT Goals Patient Stated Goal: get better OT Goal Formulation: With patient Time For Goal Achievement: 05/13/23 Potential to Achieve Goals: Good  OT Frequency: Min 1X/week    Co-evaluation              AM-PAC OT 6 Clicks Daily Activity     Outcome Measure Help from another person eating meals?: None Help from another person taking care of personal grooming?: None Help from another person toileting, which includes using toliet, bedpan, or urinal?: None Help from another person bathing (including washing, rinsing, drying)?: A Little Help from another person to put on and taking off regular upper body clothing?: None Help from another person to put on and taking off regular lower body clothing?: A Little 6 Click Score: 22   End of Session    Activity Tolerance: Patient tolerated treatment well Patient left: in bed;with call bell/phone within reach;with bed alarm set  OT Visit Diagnosis: Unsteadiness on feet (R26.81);Muscle weakness (generalized) (M62.81)                Time: 8850-8791 OT Time Calculation (min): 19 min Charges:  OT General Charges $OT Visit: 1  Visit OT Evaluation $OT Re-eval: 1 Re-eval OT Treatments $Self Care/Home Management : 8-22 mins  Warren SAUNDERS., MPH, MS, OTR/L ascom [phone] 04/29/23, 12:13 PM

## 2023-04-29 NOTE — Progress Notes (Signed)
 Patient resting in bed with eyes closed. Snoring noted. No needs identified.

## 2023-04-29 NOTE — Discharge Summary (Signed)
 Physician Discharge Summary   Patient: Dennis Savage MRN: [id number] DOB: 09-Jul-1929  Admit date:     04/15/2023  Discharge date: 04/29/23  Discharge Physician: Murvin Mana   PCP: Rudolpho Norleen BIRCH, MD   Recommendations at discharge:   Follow-up with PCP in 1 week. Refer to palliative care for poor prognosis. Follow-up with urology in 2 weeks. Follow-up with GI in 2 weeks.  Discharge Diagnoses: Principal Problem:   Sepsis due to pneumonia [facility]) Active Problems:   Elevated troponin   Acute on chronic diastolic CHF (congestive heart failure) (HCC)   Essential hypertension   Paroxysmal atrial fibrillation (HCC)   GERD without esophagitis   Chronic diastolic CHF (congestive heart failure) (HCC)   Overweight (BMI 25.0-29.9)   Thrombocytopenia (HCC)   Hypokalemia  Resolved Problems:   * No resolved hospital problems. *  Hospital Course: Dennis Savage is a 89 y.o. male with medical history significant for hypertension, CHF, COPD, GERD, atrial fibrillation, OSA, PVD and osteoarthritis, who was just discharged 3 days ago after being managed for RSV infection and acute respiratory failure with hypoxia.  He had a worsening cough with mostly clear sputum expectoration and dyspnea with associated generalized weakness.   On presentation hemodynamically stable, labs with mild hyponatremia and CO2 of 20, blood glucose of 155, BUN 40, creatinine 1.29, BNP 613 and troponin 142>>131.  Lactic acid 3.4>>3.7, WBC 22.6 with predominant neutrophil. Chest x-ray with persistent bilateral lower lobe airspace opacities concerning for pneumonia and small bilateral pleural effusion. EKG with atrial fibrillation, controlled ventricular response and RBBB.  Patient was given cefepime  and vancomycin  along with DuoNeb and Solu-Medrol .  Received IV normal saline.  Started on Rocephin  and Zithromax . So far patient has completed antibiotics.  Respiratory symptom has improved.  patient developed significant  abdominal distention, CT abdomen/pelvis showed ileus.  Received enema 1/7, had 2 large loose stools.  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  Patient developed worsening cough and short of breath at night of 1/8, was started on oral Lasix  for exacerbation congestive heart failure.   Condition finally improved, he slept better.  Renal function slightly worse but stable.  Medically stable for discharge.  While in the hospital, we did notice that the patient probably had intermittent aspiration, seen by speech therapy, believe this is from esophageal phase.  Barium swallow was obtained, did not show significant esophageal stenosis.  Patient will be referred to GI as outpatient.  Because of this, patient will have recurrent aspiration pneumonia.      Assessment and Plan:  Severe sepsis due to pneumonia [facility]) Left lower lobe bacterial pneumonia. Patient met sepsis criteria at the time of admission with tachypnea and leukocytosis.  Severe lactic acidosis of 6.9. Condition appears to be secondary to left lower lobe pneumonia.  This appears to be a bacterial pneumonia following the initial RSV infection. Patient so far has completed antibiotics. Patient also complaining of some dysphagia, this could be caused by RSV.  Will obtain speech therapy evaluation. Patient also has reported bilateral pleural effusion, I personally reviewed patient's CT scan image, this amount is small, more on the right side.  Not consistent with the parapneumonic effusion. Condition has improved.   Suspected severe ileus Irritable bowel syndrome.. CT scan of the abdomen/pelvis favor ileus rather than small bowel obstruction. Patient had colonoscopy about 5 years ago, no malignancy.  He has severe constipation alternating with diarrhea for the last 5 years.  Condition more consistent with irritable bowel syndrome. Patient  had an enema yesterday, had a very large loose stools x 2.  No nausea or vomiting.   Abdominal pain has resolved. I will start Linzess  in addition to MiraLAX . Patient has been receiving as needed lactulose .  Condition overall has improved.     Acute on chronic diastolic CHF (congestive heart failure) (HCC) Patient had a worsening short of breath on the night of 1/8, examination showed significant crackle in the base.  Condition is consistent with exacerbation congestive heart failure.  He was started on oral Lasix  40 mg twice a day on 1/9.   His short of breath and cough was better the night, but examination showed increased bilateral crackles, changed to IV Lasix  1/11.  Volume status much better today. Based on my examination, patient still has crackles in the right lower lobe, chest x-ray also showed a right lower lobe airspace disease, but overall improved aeration.  I suspect the patient continue to have intermittent aspiration, barium esophagram did not show any significant esophageal stenosis.  But the patient does has high risk of aspiration, speech therapy has placed patient on dysphagia 3 diet.  Will continue. Condition had improved, renal function stable, will renew 20 mg of Lasix  daily.   Elevated troponin- peaked at 142 - This is likely secondary to sepsis.  He has no chest pain, ischemia on ECG   Essential hypertension - continue home antihypertensive therapy.   Paroxysmal atrial fibrillation (HCC) Continue heparin , restarted Xarelto .   Chronic kidney disease stage IIIa. Hypokalemia. Metabolic acidosis. Condition is stable.  Potassium normalized per metabolic acidosis improved.   GERD without esophagitis - continue PPI therapy.     Left Adrenal mass  Right renal mass.-  Discussed with the patient and daughter, patient has a known right renal mass, followed by Dr. Penne.  Will go back to Dr. Penne after discharge.   Thrombocytopenia. Appear to be secondary to infection, condition improving.        Consultants: None Procedures performed: None   Disposition: Home health Diet recommendation:  Discharge Diet Orders (From admission, onward)     Start     Ordered   04/29/23 0000  Diet general       Comments: Dys 3 diet   04/29/23 1549           Dysphagia type 3 thin Liquid DISCHARGE MEDICATION: Allergies as of 04/29/2023       Reactions   Lisinopril Cough   Penicillins Hives, Swelling   IgE = 17 ((WNL) on 02/08/2023        Medication List     STOP taking these medications    chlorthalidone  25 MG tablet Commonly known as: HYGROTON    predniSONE  10 MG tablet Commonly known as: DELTASONE        TAKE these medications    acetaminophen  325 MG tablet Commonly known as: TYLENOL  Take 650 mg by mouth every 6 (six) hours as needed.   albuterol  108 (90 Base) MCG/ACT inhaler Commonly known as: VENTOLIN  HFA Inhale 2 puffs into the lungs every 6 (six) hours as needed for wheezing or shortness of breath.   amLODipine  10 MG tablet Commonly known as: NORVASC  Take 10 mg by mouth daily.   CALCIUM  600 + D PO Take 1 tablet by mouth daily.   cholecalciferol  25 MCG (1000 UNIT) tablet Commonly known as: VITAMIN D3 Take 1,000 Units by mouth daily.   docusate sodium  100 MG capsule Commonly known as: COLACE Take 100 mg by mouth 2 (two) times daily.  Fish Oil 300 MG Caps Take 300 mg by mouth daily.   fluticasone  50 MCG/ACT nasal spray Commonly known as: FLONASE  Place 1 spray into both nostrils 2 (two) times daily.   furosemide  20 MG tablet Commonly known as: Lasix  Take 1 tablet (20 mg total) by mouth daily.   guaiFENesin  600 MG 12 hr tablet Commonly known as: MUCINEX  Take 600 mg by mouth 2 (two) times daily.   Lactulose  20 GM/30ML Soln Take 30 mLs (20 g total) by mouth daily as needed.   linaclotide  145 MCG Caps capsule Commonly known as: LINZESS  Take 1 capsule (145 mcg total) by mouth daily before breakfast. Start taking on: April 30, 2023   omeprazole 20 MG capsule Commonly known as:  PRILOSEC Take 20 mg by mouth daily.   Orgovyx  120 MG tablet Generic drug: relugolix  Take 1 tablet (120 mg total) by mouth daily.   polyethylene glycol 17 g packet Commonly known as: MIRALAX  / GLYCOLAX  Take 17 g by mouth daily. Start taking on: April 30, 2023   PRESERVISION AREDS 2 PO Take 1 capsule by mouth 2 (two) times daily.   rivaroxaban  20 MG Tabs tablet Commonly known as: XARELTO  Take 20 mg by mouth daily with supper. What changed: Another medication with the same name was removed. Continue taking this medication, and follow the directions you see here.   traMADol  50 MG tablet Commonly known as: ULTRAM  Take 50 mg by mouth 2 (two) times daily as needed for moderate pain (pain score 4-6) or severe pain (pain score 7-10).   Trelegy Ellipta 100-62.5-25 MCG/ACT Aepb Generic drug: Fluticasone -Umeclidin-Vilant Inhale 1 puff into the lungs daily.        Contact information for follow-up providers     Rudolpho Norleen BIRCH, MD Follow up.   Specialty: Internal Medicine Why: Hospital follow up Contact information: [address] [facility] [place] [address] [phone]         Therisa Bi, MD Follow up in 2 week(s).   Specialty: Gastroenterology Contact information: [address] [place] [address] [phone]         Penne Knee, MD Follow up in 2 week(s).   Specialty: Urology Contact information: [address] [place] [address] [phone]              Contact information for after-discharge care     Destination     [facility] .   Service: Skilled Nursing Contact information: [address] [place]  [address] [phone]                    Discharge Exam: Fredricka Weights   04/16/23 2317 04/21/23 1857  Weight: 89.8 kg 86.6 kg   General exam: Appears calm and comfortable  Respiratory system: Clear to  auscultation. Respiratory effort normal. Cardiovascular system: S1 & S2 heard, RRR. No JVD, murmurs, rubs, gallops or clicks. No pedal edema. Gastrointestinal system: Abdomen is nondistended, soft and nontender. No organomegaly or masses felt. Normal bowel sounds heard. Central nervous system: Alert and oriented. No focal neurological deficits. Extremities: Symmetric 5 x 5 power. Skin: No rashes, lesions or ulcers Psychiatry: Judgement and insight appear normal. Mood & affect appropriate.    Condition at discharge: fair  The results of significant diagnostics from this hospitalization (including imaging, microbiology, ancillary and laboratory) are listed below for reference.   Imaging Studies: DG ESOPHAGUS W SINGLE CM (SOL OR THIN BA) Addendum  Date: 04/29/2023 ADDENDUM REPORT: 04/29/2023 15:42 ADDENDUM: The following correction is made to the impression section of the original report: Barium tablet would not pass beyond the distal esophagus, where there may be very subtle narrowing. Electronically Signed   By: Newell Eke M.D.   On: 04/29/2023 15:42   Result Date: 04/29/2023 CLINICAL DATA:  Patient reports several year history of chronic dysphagia. Suspected aspiration pneumonia. Request for formal esophagram. EXAM: ESOPHAGUS/BARIUM SWALLOW/TABLET STUDY TECHNIQUE: Limited single contrast examination was performed using thin liquid barium. This exam was performed by Franky Rusk PA-C , and was supervised and interpreted by Dr. Newell Eke. FLUOROSCOPY: Radiation Exposure Index (as provided by the fluoroscopic device): 63.20 mGy Kerma COMPARISON:  None Available. FINDINGS: Swallowing: Grossly normal. No vestibular penetration or aspiration identified. Pharynx: Unremarkable. Esophagus: Normal appearance. No mucosal lesions. Subtle mild narrowing of the lower esophagus (image 6). Esophageal motility: Grossly normal though not fully assessed as patient could not lay prone. Hiatal Hernia: None.  Gastroesophageal reflux: None visualized. Ingested 13 mm barium tablet: Became stuck at the lower third of the esophagus. Tablet did not pass after several minutes of subsequent swallows of both water and thin barium. IMPRESSION: Esophagus would not pass beyond the distal esophagus, where there may be very subtle narrowing. No definitive vestibular penetration or aspiration identified. If aspiration is still suspected, consider formal speech therapy evaluation. Electronically Signed: By: Newell Eke M.D. On: 04/29/2023 14:28   DG Chest Port 1 View Result Date: 04/28/2023 CLINICAL DATA:  [id number] Aspiration pneumonia (HCC) [id number] EXAM: PORTABLE CHEST 1 VIEW COMPARISON:  04/15/2023 FINDINGS: Stable cardiomegaly. Aortic atherosclerosis. Mild residual right basilar interstitial opacity. Overall improved aeration of the lung bases compared to prior. No pleural effusion. No pneumothorax. IMPRESSION: Mild residual right basilar interstitial opacity. Overall improved aeration of the lung bases compared to prior. Electronically Signed   By: Mabel Converse D.O.   On: 04/28/2023 10:25   DG Abd 1 View Result Date: 04/23/2023 CLINICAL DATA:  Ileus. EXAM: ABDOMEN - 1 VIEW COMPARISON:  April 22, 2023. FINDINGS: Contrast is noted in nondilated colon. Mild small bowel dilatation is noted which may represent ileus or possibly distal small bowel obstruction. IMPRESSION: Mild small bowel dilatation is noted as described above. Electronically Signed   By: Lynwood Landy Raddle M.D.   On: 04/23/2023 10:28   CT ABDOMEN PELVIS WO CONTRAST Result Date: 04/22/2023 CLINICAL DATA:  Bowel obstruction suspected EXAM: CT ABDOMEN AND PELVIS WITHOUT CONTRAST TECHNIQUE: Multidetector CT imaging of the abdomen and pelvis was performed following the standard protocol without IV contrast. RADIATION DOSE REDUCTION: This exam was performed according to the departmental dose-optimization program which includes automated exposure control, adjustment  of the mA and/or kV according to patient size and/or use of iterative reconstruction technique. COMPARISON:  Plain film abdomen same day, CT 04/05/2023 com PET-CT 02/07/2022. FINDINGS: Lower chest: Bibasilar pleural effusions associated passive atelectasis. Hepatobiliary: No focal hepatic lesion on noncontrast exam. Gallbladder unremarkable. Small cyst LEFT hepatic lobe. Pancreas: Pancreas is normal. No ductal dilatation. No pancreatic inflammation. Spleen: Normal spleen Adrenals/urinary tract: LEFT adrenal mass again demonstrated measuring 5.6 by 5.7 cm compared to 5.7 x 6.5 cm on comparison CT 04/05/2023. Multiple mixed density renal cysts. Solid mass extending from lower pole of the RIGHT kidney measures 49 mm. 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 Stomach is normal otherwise. Duodenum is normal. Contrast flows through the small bowel there  are air-fluid levels and mild diffuse dilatation up to 3.5 cm. Contrast reached the ascending colon. The colon and rectosigmoid colon are normal. Vascular/Lymphatic: Abdominal aorta is normal caliber with atherosclerotic calcification. There is no retroperitoneal or periportal lymphadenopathy. No pelvic lymphadenopathy. Reproductive: Prostate unremarkable Other: No intraperitoneal free air Musculoskeletal: Bilateral hip prosthetics. IMPRESSION: 1. Mildly dilated loops of small bowel with air-fluid levels. Contrast reaches the ascending colon. Findings more suggestive of ileus than small bowel obstruction. 2. 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 3. Enlarging LEFT suprarenal mass. This is been present on multiple comparison exams. Concerning for relatively indolent adrenal neoplasm. 4. 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 Electronically Signed   By: Jackquline Boxer M.D.    On: 04/22/2023 15:03   DG Abd 1 View Result Date: 04/22/2023 CLINICAL DATA:  Bowel obstruction. EXAM: ABDOMEN - 1 VIEW COMPARISON:  08/10/2014 FINDINGS: There is marked gaseous distention of the stomach. Gas superimposed on the proximal stomach has a curvilinear character concerning for pneumatosis, potentially involving the gastric cardia/fundus or posterior fundal diverticulum. Diffuse gaseous distension of small bowel evident. Gas is visible in the nondilated colon down to the level of the distal descending segment. Small volume of gas evident in the rectum. Bones are diffusely demineralized. Status post bilateral hip replacement. IMPRESSION: 1. Unusual curvilinear gas superimposed on the proximal stomach raising concern for pneumatosis. CT abdomen/pelvis (with intravenous contrast if renal function permits) recommended to further evaluate. 2. Gaseous distention of the stomach with diffuse gaseous distension of small bowel. Imaging features raise concern for severe ileus or evolving small bowel obstruction. There is a trace amount of colonic gas evident. These results will be called to the ordering clinician or representative by the Radiologist Assistant, and communication documented in the PACS or [employer]. Electronically Signed   By: Camellia Candle M.D.   On: 04/22/2023 05:26   DG Chest 2 View Result Date: 04/15/2023 CLINICAL DATA:  Cough and shortness of breath. EXAM: CHEST - 2 VIEW COMPARISON:  04/11/2023 FINDINGS: Stable cardiomediastinal contours. Small bilateral pleural effusions. Persistent bilateral lower lobe airspace opacities concerning for pneumonia. Upper lung zones appear clear. Calcified granuloma identified within the left midlung. Spondylosis noted in the thoracic spine. IMPRESSION: 1. Persistent bilateral lower lobe airspace opacities concerning for pneumonia. 2. Small bilateral pleural effusions. Electronically Signed   By: Waddell Calk M.D.   On: 04/15/2023 15:47   DG Chest  Port 1 View Result Date: 04/11/2023 CLINICAL DATA:  89 year old male with hypoxia. EXAM: PORTABLE CHEST 1 VIEW COMPARISON:  CTA chest 04/05/2023 and earlier. FINDINGS: Portable AP semi upright view at 0729 hours. Stable cardiomegaly and mediastinal contours, mediastinal lymphadenopathy demonstrated on CTA. Confluent retrocardiac opacity, not improved. No superimposed pneumothorax, pulmonary edema. Small layering pleural effusions better demonstrated by CT. Right lung base appears stable. Stable visualized osseous structures. IMPRESSION: Confluent left lung base opacity without improvement since 04/05/2023. Small pleural effusions better demonstrated by CTA. Lungs elsewhere appear stable. Stable cardiomegaly and evidence of mediastinal lymphadenopathy. Electronically Signed   By: VEAR Hurst M.D.   On: 04/11/2023 09:29   CT Angio Chest PE W and/or Wo Contrast Result Date: 04/05/2023 CLINICAL DATA:  Worsening dyspnea on exertion, new hypoxia history of prostate cancer * Tracking Code: BO * EXAM: CT ANGIOGRAPHY CHEST WITH CONTRAST TECHNIQUE: Multidetector CT imaging of the chest was performed using the standard protocol during bolus administration of intravenous contrast. Multiplanar CT image reconstructions and  MIPs were obtained to evaluate the vascular anatomy. RADIATION DOSE REDUCTION: This exam was performed according to the departmental dose-optimization program which includes automated exposure control, adjustment of the mA and/or kV according to patient size and/or use of iterative reconstruction technique. CONTRAST:  75mL OMNIPAQUE  IOHEXOL  350 MG/ML SOLN COMPARISON:  CT chest, 08/28/2019 FINDINGS: Cardiovascular: Satisfactory opacification of the pulmonary arteries to the segmental level. No evidence of pulmonary embolism. Cardiomegaly. Left coronary artery calcifications. Enlargement of the main pulmonary artery measuring up to 3.8 cm in caliber. Small pericardial effusion. Mediastinum/Nodes: Numerous  prominent mediastinal and hilar lymph nodes. Thyroid gland, trachea, and esophagus demonstrate no significant findings. Lungs/Pleura: Trace bilateral pleural effusions. Diffuse bilateral bronchial wall thickening and extensive heterogeneous and ground-glass airspace opacity throughout the lung bases (series 5, image 106). Upper Abdomen: Continued enlargement of a left adrenal mass measuring 6.6 x 5.8 cm, previously 4.8 x 4.5 cm (series 4, image 154). Musculoskeletal: No chest wall abnormality. No acute osseous findings. Review of the MIP images confirms the above findings. IMPRESSION: 1. Negative examination for pulmonary embolism. 2. Diffuse bilateral bronchial wall thickening and extensive heterogeneous and ground-glass airspace opacity throughout the lung bases, consistent with infection or aspiration. 3. Trace bilateral pleural effusions. 4. Numerous prominent mediastinal and hilar lymph nodes, possibly reactive although concerning for metastases in the setting of patient's known malignancy. 5. Continued enlargement of a left adrenal mass measuring 6.6 x 5.8 cm, previously 4.8 x 4.5 cm. Behavior over time is concerning for a metastasis or primary adrenal malignancy. 6. Cardiomegaly and coronary artery disease. 7. Enlargement of the main pulmonary artery, as can be seen in pulmonary hypertension. Aortic Atherosclerosis (ICD10-I70.0). Electronically Signed   By: Marolyn JONETTA Jaksch M.D.   On: 04/05/2023 18:48   DG Chest 2 View Result Date: 04/05/2023 CLINICAL DATA:  Shortness of breath EXAM: CHEST - 2 VIEW COMPARISON:  X-ray 10/28/2022. FINDINGS: Enlarged cardiopericardial silhouette. Small bilateral pleural effusions with some adjacent lung base opacities. Atelectasis versus infiltrate. Recommend follow-up. No pneumothorax. Dense rounded structure of the left midthorax could be a calcified lung nodule or a bone lesion. Unchanged from previous IMPRESSION: Enlarged heart with small pleural effusions and adjacent  opacities. Atelectasis versus infiltrate. Recommend follow-up. Electronically Signed   By: Ranell Bring M.D.   On: 04/05/2023 16:16    Microbiology: Results for orders placed or performed during the hospital encounter of 04/15/23  Blood culture (routine x 2)     Status: None   Collection Time: 04/15/23  7:00 PM   Specimen: BLOOD  Result Value Ref Range Status   Specimen Description BLOOD BLOOD RIGHT ARM  Final   Special Requests   Final    BOTTLES DRAWN AEROBIC AND ANAEROBIC Blood Culture adequate volume   Culture   Final    NO GROWTH 5 DAYS Performed at [facility], [address] [place], [place] [address]    Report Status 04/20/2023 FINAL  Final  Blood culture (routine x 2)     Status: None   Collection Time: 04/15/23  7:11 PM   Specimen: BLOOD  Result Value Ref Range Status   Specimen Description BLOOD BLOOD RIGHT FOREARM  Final   Special Requests   Final    BOTTLES DRAWN AEROBIC AND ANAEROBIC Blood Culture results may not be optimal due to an inadequate volume of blood received in culture bottles   Culture   Final    NO GROWTH 5 DAYS Performed at [facility], [address] [place], [place]  [address]    Report Status 04/20/2023 FINAL  Final  Resp panel by RT-PCR (RSV, Flu A&B, Covid) Anterior Nasal Swab     Status: Abnormal   Collection Time: 04/15/23  7:57 PM   Specimen: Anterior Nasal Swab  Result Value Ref Range Status   SARS Coronavirus 2 by RT PCR NEGATIVE NEGATIVE Final    Comment: (NOTE) SARS-CoV-2 target nucleic acids are NOT DETECTED.  The SARS-CoV-2 RNA is generally detectable in upper respiratory specimens during the acute phase of infection. The lowest concentration of SARS-CoV-2 viral copies this assay can detect is 138 copies/mL. A negative result does not preclude SARS-Cov-2 infection and should not be used as the sole basis for treatment or other patient management decisions. A negative result may occur with  improper specimen  collection/handling, submission of specimen other than nasopharyngeal swab, presence of viral mutation(s) within the areas targeted by this assay, and inadequate number of viral copies(<138 copies/mL). A negative result must be combined with clinical observations, patient history, and epidemiological information. The expected result is Negative.  Fact Sheet for Patients:  bloggercourse.com  Fact Sheet for Healthcare Providers:  seriousbroker.it  This test is no t yet approved or cleared by the [place]  FDA and  has been authorized for detection and/or diagnosis of SARS-CoV-2 by FDA under an Emergency Use Authorization (EUA). This EUA will remain  in effect (meaning this test can be used) for the duration of the COVID-19 declaration under Section 564(b)(1) of the Act, 21 U.S.C.section 360bbb-3(b)(1), unless the authorization is terminated  or revoked sooner.       Influenza A by PCR NEGATIVE NEGATIVE Final   Influenza B by PCR NEGATIVE NEGATIVE Final    Comment: (NOTE) The Xpert Xpress SARS-CoV-2/FLU/RSV plus assay is intended as an aid in the diagnosis of influenza from Nasopharyngeal swab specimens and should not be used as a sole basis for treatment. Nasal washings and aspirates are unacceptable for Xpert Xpress SARS-CoV-2/FLU/RSV testing.  Fact Sheet for Patients: bloggercourse.com  Fact Sheet for Healthcare Providers: seriousbroker.it  This test is not yet approved or cleared by the [place]  FDA and has been authorized for detection and/or diagnosis of SARS-CoV-2 by FDA under an Emergency Use Authorization (EUA). This EUA will remain in effect (meaning this test can be used) for the duration of the COVID-19 declaration under Section 564(b)(1) of the Act, 21 U.S.C. section 360bbb-3(b)(1), unless the authorization is terminated or revoked.     Resp Syncytial  Virus by PCR POSITIVE (A) NEGATIVE Final    Comment: (NOTE) Fact Sheet for Patients: bloggercourse.com  Fact Sheet for Healthcare Providers: seriousbroker.it  This test is not yet approved or cleared by the [place]  FDA and has been authorized for detection and/or diagnosis of SARS-CoV-2 by FDA under an Emergency Use Authorization (EUA). This EUA will remain in effect (meaning this test can be used) for the duration of the COVID-19 declaration under Section 564(b)(1) of the Act, 21 U.S.C. section 360bbb-3(b)(1), unless the authorization is terminated or revoked.  Performed at [facility], [address] [place], [place] [address]   MRSA Next Gen by PCR, Nasal     Status: None   Collection Time: 04/16/23 12:19 PM   Specimen: Nasal Mucosa; Nasal Swab  Result Value Ref Range Status   MRSA by PCR Next Gen NOT DETECTED NOT DETECTED Final    Comment: (NOTE) The GeneXpert MRSA Assay (FDA approved for NASAL specimens only), is one component of a comprehensive MRSA colonization surveillance  program. It is not intended to diagnose MRSA infection nor to guide or monitor treatment for MRSA infections. Test performance is not FDA approved in patients less than 74 years old. Performed at [facility], [address] [place], [place] [address]     Labs: CBC: Recent Labs  Lab 04/23/23 0455 04/24/23 0431 04/25/23 0609 04/26/23 0448  WBC 4.7 4.7 4.3 4.1  HGB 12.0* 11.4* 11.2* 11.6*  HCT 37.7* 35.1* 33.8* 35.5*  MCV 93.3 91.9 88.7 90.1  PLT 136* 123* 117* 124*   Basic Metabolic Panel: Recent Labs  Lab 04/25/23 0609 04/26/23 0448 04/27/23 0445 04/28/23 0448 04/29/23 0541 04/29/23 1157  NA 140 139 140 137 141 142  K 3.3* 3.7 3.5 3.5 3.8 4.3  CL 107 105 104 100 102 102  CO2 25 23 28 26 29 30   GLUCOSE 110* 100* 99 105* 95 90  BUN 19 14 14 17 19 21   CREATININE 1.16 1.19 1.13 1.34* 1.52* 1.50*   CALCIUM  8.3* 8.5* 8.7* 8.5* 8.6* 9.3  MG 1.9 1.9 1.8 1.8  --   --    Liver Function Tests: No results for input(s): AST, ALT, ALKPHOS, BILITOT, PROT, ALBUMIN in the last 168 hours. CBG: No results for input(s): GLUCAP in the last 168 hours.  Discharge time spent: greater than 30 minutes.  Signed: Murvin Mana, MD [facility] 04/29/2023

## 2023-04-29 NOTE — TOC Transition Note (Signed)
 Transition of Care [facility]) - Discharge Note   Patient Details  Name: Dennis Savage MRN: [id number] Date of Birth: 01-13-1930  Transition [facility] CM/SW Contact:  Ladene Lady, LCSW Phone Number: 04/29/2023, 3:57 PM   Clinical Narrative:  Pt discharging home with [facility]. Kelsey notified.      Final next level of care: Home w Home Health Services Barriers to Discharge: Barriers Resolved   Patient Goals and CMS Choice   CMS Medicare.gov Compare Post Acute Care list provided to:: Patient        Discharge Placement                       Discharge Plan and Services Additional resources added to the After Visit Summary for                            [facility] Arranged: PT, OT, RN [facility] Agency: [facility] Date [facility] Agency Contacted: 04/29/23   Representative spoke with at [facility] Agency: larraine  Social Drivers of Health (SDOH) Interventions SDOH Screenings   Food Insecurity: No Food Insecurity (04/16/2023)  Housing: Low Risk  (04/16/2023)  Transportation Needs: No Transportation Needs (04/16/2023)  Utilities: Not At Risk (04/16/2023)  Depression (PHQ2-9): Low Risk  (06/30/2021)  Financial Resource Strain: Low Risk  (03/29/2023)   Received from [facility]  Social Connections: Moderately Isolated (04/16/2023)  Tobacco Use: Medium Risk (04/05/2023)     Readmission Risk Interventions    04/18/2023    1:48 PM  Readmission Risk Prevention Plan  Transportation Screening Complete  Medication Review (RN Care Manager) Complete  HRI or Home Care Consult Complete  Palliative Care Screening Not Applicable  Skilled Nursing Facility Complete

## 2023-04-29 NOTE — Progress Notes (Addendum)
 Mobility Specialist - Progress Note  During mobility: HR 75, SpO2 95%   04/29/23 1109  Mobility  Activity Ambulated with assistance in hallway;Ambulated with assistance to bathroom  Level of Assistance Standby assist, set-up cues, supervision of patient - no hands on  Assistive Device Front wheel walker  Distance Ambulated (ft) 175 ft  Activity Response Tolerated well  Mobility visit 1 Mobility  Mobility Specialist Start Time (ACUTE ONLY) 1010  Mobility Specialist Stop Time (ACUTE ONLY) 1027  Mobility Specialist Time Calculation (min) (ACUTE ONLY) 17 min   Pt supine upon entry, utilizing RA. Pt agreeable to OOB amb in the hallway this date. Pt completed bed mob indep, STS to RW ModI and amb one lap around the NS with supervision-- O2 WNL during activity. Pt returned to the room, left supine with alarm set and needs within reach. RN notified.   America Silvan Mobility Specialist 04/29/23 11:23 AM

## 2023-04-29 NOTE — Plan of Care (Signed)
  Problem: Respiratory: Goal: Ability to maintain adequate ventilation will improve Outcome: Progressing   Problem: Education: Goal: Knowledge of General Education information will improve Description: Including pain rating scale, medication(s)/side effects and non-pharmacologic comfort measures Outcome: Progressing   Problem: Health Behavior/Discharge Planning: Goal: Ability to manage health-related needs will improve Outcome: Progressing   Problem: Clinical Measurements: Goal: Respiratory complications will improve Outcome: Progressing   Problem: Nutrition: Goal: Adequate nutrition will be maintained Outcome: Progressing   Problem: Coping: Goal: Level of anxiety will decrease Outcome: Progressing

## 2023-04-30 DIAGNOSIS — A419 Sepsis, unspecified organism: Secondary | ICD-10-CM | POA: Diagnosis not present

## 2023-04-30 DIAGNOSIS — I480 Paroxysmal atrial fibrillation: Secondary | ICD-10-CM | POA: Diagnosis not present

## 2023-04-30 DIAGNOSIS — J189 Pneumonia, unspecified organism: Secondary | ICD-10-CM | POA: Diagnosis not present

## 2023-04-30 DIAGNOSIS — I5033 Acute on chronic diastolic (congestive) heart failure: Secondary | ICD-10-CM | POA: Diagnosis not present

## 2023-04-30 NOTE — Discharge Summary (Signed)
 Physician Discharge Summary   Patient: Dennis [facility] MRN: [id number] DOB: 27-Aug-1929  Admit date:     04/15/2023  Discharge date: 04/30/23  Discharge Physician: Murvin Mana   PCP: Rudolpho Norleen BIRCH, MD   Recommendations at discharge:   Follow with PCP in 1 week. Referred to palliative care for poor prognosis. Check a BMP and magnesium  at the next office visit.  Discharge Diagnoses: Principal Problem:   Sepsis due to pneumonia [facility]) Active Problems:   Elevated troponin   Acute on chronic diastolic CHF (congestive heart failure) (HCC)   Essential hypertension   Paroxysmal atrial fibrillation (HCC)   GERD without esophagitis   Chronic diastolic CHF (congestive heart failure) (HCC)   Overweight (BMI 25.0-29.9)   Thrombocytopenia (HCC)   Hypokalemia  Resolved Problems:   * No resolved [facility] problems. *  [facility] Course: Dennis [facility] is a 89 y.o. male with medical history significant for hypertension, CHF, COPD, GERD, atrial fibrillation, OSA, PVD and osteoarthritis, who was just discharged 3 days ago after being managed for RSV infection and acute respiratory failure with hypoxia.  He had a worsening cough with mostly clear sputum expectoration and dyspnea with associated generalized weakness.   On presentation hemodynamically stable, labs with mild hyponatremia and CO2 of 20, blood glucose of 155, BUN 40, creatinine 1.29, BNP 613 and troponin 142>>131.  Lactic acid 3.4>>3.7, WBC 22.6 with predominant neutrophil. Chest x-ray with persistent bilateral lower lobe airspace opacities concerning for pneumonia and small bilateral pleural effusion. EKG with atrial fibrillation, controlled ventricular response and RBBB.  Patient was given cefepime  and vancomycin  along with DuoNeb and Solu-Medrol .  Received IV normal saline.  Started on Rocephin  and Zithromax . So far patient has completed antibiotics.  Respiratory symptom has improved.  patient developed significant abdominal  distention, CT abdomen/pelvis showed ileus.  Received enema 1/7, had 2 large loose stools.  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  Patient developed worsening cough and short of breath at night of 1/8, was started on oral Lasix  for exacerbation congestive heart failure.   Condition finally improved, he slept better.  Renal function slightly worse but stable.  Medically stable for discharge.  While in the [facility], we did notice that the patient probably had intermittent aspiration, seen by speech therapy, believe this is from esophageal phase.  Barium swallow was obtained, did not show significant esophageal stenosis.  Patient will be referred to GI as outpatient.  Because of this, patient will have recurrent aspiration pneumonia.    Patient was discharged yesterday, family could not pick him up.  Will be discharged today.  Assessment and Plan: Severe sepsis due to pneumonia [facility]) Left lower lobe bacterial pneumonia. Patient met sepsis criteria at the time of admission with tachypnea and leukocytosis.  Severe lactic acidosis of 6.9. Condition appears to be secondary to left lower lobe pneumonia.  This appears to be a bacterial pneumonia following the initial RSV infection. Patient so far has completed antibiotics. Patient also complaining of some dysphagia, this could be caused by RSV.  Will obtain speech therapy evaluation. Patient also has reported bilateral pleural effusion, I personally reviewed patient's CT scan image, this amount is small, more on the right side.  Not consistent with the parapneumonic effusion. Condition has improved.   Suspected severe ileus Irritable bowel syndrome.. CT scan of the abdomen/pelvis favor ileus rather than small bowel obstruction. Patient had colonoscopy about 5 years ago, no malignancy.  He has severe constipation alternating with diarrhea for the last  5 years.  Condition more consistent with irritable bowel syndrome. Patient had an  enema yesterday, had a very large loose stools x 2.  No nausea or vomiting.  Abdominal pain has resolved. I will start Linzess  in addition to MiraLAX . Patient has been receiving as needed lactulose .  Condition overall has improved.     Acute on chronic diastolic CHF (congestive heart failure) (HCC) Patient had a worsening short of breath on the night of 1/8, examination showed significant crackle in the base.  Condition is consistent with exacerbation congestive heart failure.  He was started on oral Lasix  40 mg twice a day on 1/9.   His short of breath and cough was better the night, but examination showed increased bilateral crackles, changed to IV Lasix  1/11.  Volume status much better today. Based on my examination, patient still has crackles in the right lower lobe, chest x-ray also showed a right lower lobe airspace disease, but overall improved aeration.  I suspect the patient continue to have intermittent aspiration, barium esophagram did not show any significant esophageal stenosis.  But the patient does has high risk of aspiration, speech therapy has placed patient on dysphagia 3 diet.  Will continue. Condition had improved, renal function stable, will renew 20 mg of Lasix  daily.   Elevated troponin- peaked at 142 - This is likely secondary to sepsis.  He has no chest pain, ischemia on ECG   Essential hypertension Resumed antihypertensive therapy.   Paroxysmal atrial fibrillation (HCC) Was on heparin , restarted Xarelto .   Chronic kidney disease stage IIIa. Hypokalemia. Metabolic acidosis. Condition is stable.  Potassium normalized, metabolic acidosis improved.   GERD without esophagitis - continue PPI therapy.       Consultants: none Procedures performed: None  Disposition: Home health Diet recommendation:  Discharge Diet Orders (From admission, onward)     Start     Ordered   04/29/23 0000  Diet general       Comments: Dys 3 diet   04/29/23 1549            Dysphagia type 3 thin Liquid DISCHARGE MEDICATION: Allergies as of 04/30/2023       Reactions   Lisinopril Cough   Penicillins Hives, Swelling   IgE = 17 ((WNL) on 02/08/2023        Medication List     STOP taking these medications    chlorthalidone  25 MG tablet Commonly known as: HYGROTON    predniSONE  10 MG tablet Commonly known as: DELTASONE        TAKE these medications    acetaminophen  325 MG tablet Commonly known as: TYLENOL  Take 650 mg by mouth every 6 (six) hours as needed.   albuterol  108 (90 Base) MCG/ACT inhaler Commonly known as: VENTOLIN  HFA Inhale 2 puffs into the lungs every 6 (six) hours as needed for wheezing or shortness of breath.   amLODipine  10 MG tablet Commonly known as: NORVASC  Take 10 mg by mouth daily.   CALCIUM  600 + D PO Take 1 tablet by mouth daily.   cholecalciferol  25 MCG (1000 UNIT) tablet Commonly known as: VITAMIN D3 Take 1,000 Units by mouth daily.   docusate sodium  100 MG capsule Commonly known as: COLACE Take 100 mg by mouth 2 (two) times daily.   Fish Oil 300 MG Caps Take 300 mg by mouth daily.   fluticasone  50 MCG/ACT nasal spray Commonly known as: FLONASE  Place 1 spray into both nostrils 2 (two) times daily.   furosemide  20 MG tablet Commonly known as:  Lasix  Take 1 tablet (20 mg total) by mouth daily.   guaiFENesin  600 MG 12 hr tablet Commonly known as: MUCINEX  Take 600 mg by mouth 2 (two) times daily.   Lactulose  20 GM/30ML Soln Take 30 mLs (20 g total) by mouth daily as needed.   linaclotide  145 MCG Caps capsule Commonly known as: LINZESS  Take 1 capsule (145 mcg total) by mouth daily before breakfast.   omeprazole 20 MG capsule Commonly known as: PRILOSEC Take 20 mg by mouth daily.   Orgovyx  120 MG tablet Generic drug: relugolix  Take 1 tablet (120 mg total) by mouth daily.   polyethylene glycol 17 g packet Commonly known as: MIRALAX  / GLYCOLAX  Take 17 g by mouth daily.   PRESERVISION AREDS  2 PO Take 1 capsule by mouth 2 (two) times daily.   rivaroxaban  20 MG Tabs tablet Commonly known as: XARELTO  Take 20 mg by mouth daily with supper. What changed: Another medication with the same name was removed. Continue taking this medication, and follow the directions you see here.   traMADol  50 MG tablet Commonly known as: ULTRAM  Take 50 mg by mouth 2 (two) times daily as needed for moderate pain (pain score 4-6) or severe pain (pain score 7-10).   Trelegy Ellipta 100-62.5-25 MCG/ACT Aepb Generic drug: Fluticasone -Umeclidin-Vilant Inhale 1 puff into the lungs daily.        Contact information for follow-up providers     Rudolpho Norleen BIRCH, MD Follow up.   Specialty: Internal Medicine Why: [facility] follow up Contact information: [address] [facility] [place] [address] [phone]         Therisa Bi, MD Follow up in 2 week(s).   Specialty: Gastroenterology Contact information: [address] [place] [address] [phone]         Penne Knee, MD Follow up in 2 week(s).   Specialty: Urology Contact information: [address] [place] [address] [phone]              Contact information for after-discharge care     Destination     [facility] Preferred SNF .   Service: Skilled Nursing Contact information: [address] [place]  [address] [phone]                    Discharge Exam: Fredricka Weights   04/16/23 2317 04/21/23 1857  Weight: 89.8 kg 86.6 kg   General exam: Appears calm and comfortable  Respiratory system: Clear to auscultation. Respiratory effort normal. Cardiovascular system: S1 & S2 heard, RRR. No JVD, murmurs, rubs, gallops or clicks. No pedal edema. Gastrointestinal system: Abdomen is nondistended, soft and nontender. No organomegaly or masses felt. Normal bowel sounds heard. Central nervous system:  Alert and oriented. No focal neurological deficits. Extremities: Symmetric 5 x 5 power. Skin: No rashes, lesions or ulcers Psychiatry: Judgement and insight appear normal. Mood & affect appropriate.    Condition at discharge: fair  The results of significant diagnostics from this hospitalization (including imaging, microbiology, ancillary and laboratory) are listed below for reference.   Imaging Studies: DG ESOPHAGUS W SINGLE CM (SOL OR THIN BA) Addendum Date: 04/29/2023 ADDENDUM REPORT: 04/29/2023 15:42 ADDENDUM: The following correction is made to the impression section of the original report: Barium tablet would not pass beyond the distal esophagus, where there may be very subtle narrowing. Electronically Signed   By: Newell Eke M.D.   On: 04/29/2023 15:42   Result  Date: 04/29/2023 CLINICAL DATA:  Patient reports several year history of chronic dysphagia. Suspected aspiration pneumonia. Request for formal esophagram. EXAM: ESOPHAGUS/BARIUM SWALLOW/TABLET STUDY TECHNIQUE: Limited single contrast examination was performed using thin liquid barium. This exam was performed by Franky Rusk PA-C , and was supervised and interpreted by Dr. Newell Eke. FLUOROSCOPY: Radiation Exposure Index (as provided by the fluoroscopic device): 63.20 mGy Kerma COMPARISON:  None Available. FINDINGS: Swallowing: Grossly normal. No vestibular penetration or aspiration identified. Pharynx: Unremarkable. Esophagus: Normal appearance. No mucosal lesions. Subtle mild narrowing of the lower esophagus (image 6). Esophageal motility: Grossly normal though not fully assessed as patient could not lay prone. Hiatal Hernia: None. Gastroesophageal reflux: None visualized. Ingested 13 mm barium tablet: Became stuck at the lower third of the esophagus. Tablet did not pass after several minutes of subsequent swallows of both water and thin barium. IMPRESSION: Esophagus would not pass beyond the distal esophagus, where there may  be very subtle narrowing. No definitive vestibular penetration or aspiration identified. If aspiration is still suspected, consider formal speech therapy evaluation. Electronically Signed: By: Newell Eke M.D. On: 04/29/2023 14:28   DG Chest Port 1 View Result Date: 04/28/2023 CLINICAL DATA:  [id number] Aspiration pneumonia (HCC) [id number] EXAM: PORTABLE CHEST 1 VIEW COMPARISON:  04/15/2023 FINDINGS: Stable cardiomegaly. Aortic atherosclerosis. Mild residual right basilar interstitial opacity. Overall improved aeration of the lung bases compared to prior. No pleural effusion. No pneumothorax. IMPRESSION: Mild residual right basilar interstitial opacity. Overall improved aeration of the lung bases compared to prior. Electronically Signed   By: Mabel Converse D.O.   On: 04/28/2023 10:25   DG Abd 1 View Result Date: 04/23/2023 CLINICAL DATA:  Ileus. EXAM: ABDOMEN - 1 VIEW COMPARISON:  April 22, 2023. FINDINGS: Contrast is noted in nondilated colon. Mild small bowel dilatation is noted which may represent ileus or possibly distal small bowel obstruction. IMPRESSION: Mild small bowel dilatation is noted as described above. Electronically Signed   By: Lynwood Landy Raddle M.D.   On: 04/23/2023 10:28   CT ABDOMEN PELVIS WO CONTRAST Result Date: 04/22/2023 CLINICAL DATA:  Bowel obstruction suspected EXAM: CT ABDOMEN AND PELVIS WITHOUT CONTRAST TECHNIQUE: Multidetector CT imaging of the abdomen and pelvis was performed following the standard protocol without IV contrast. RADIATION DOSE REDUCTION: This exam was performed according to the departmental dose-optimization program which includes automated exposure control, adjustment of the mA and/or kV according to patient size and/or use of iterative reconstruction technique. COMPARISON:  Plain film abdomen same day, CT 04/05/2023 com PET-CT 02/07/2022. FINDINGS: Lower chest: Bibasilar pleural effusions associated passive atelectasis. Hepatobiliary: No focal hepatic lesion on  noncontrast exam. Gallbladder unremarkable. Small cyst LEFT hepatic lobe. Pancreas: Pancreas is normal. No ductal dilatation. No pancreatic inflammation. Spleen: Normal spleen Adrenals/urinary tract: LEFT adrenal mass again demonstrated measuring 5.6 by 5.7 cm compared to 5.7 x 6.5 cm on comparison CT 04/05/2023. Multiple mixed density renal cysts. Solid mass extending from lower pole of the RIGHT kidney measures 49 mm. 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 Stomach is normal otherwise. Duodenum is normal. Contrast flows through the small bowel there are air-fluid levels and mild diffuse dilatation up to 3.5 cm. Contrast reached the ascending colon. The colon and rectosigmoid colon are normal. Vascular/Lymphatic: Abdominal aorta is normal caliber with atherosclerotic calcification. There is no retroperitoneal or periportal lymphadenopathy. No pelvic lymphadenopathy. Reproductive: Prostate unremarkable Other: No intraperitoneal free air Musculoskeletal: Bilateral hip  prosthetics. IMPRESSION: 1. Mildly dilated loops of small bowel with air-fluid levels. Contrast reaches the ascending colon. Findings more suggestive of ileus than small bowel obstruction. 2. 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 3. Enlarging LEFT suprarenal mass. This is been present on multiple comparison exams. Concerning for relatively indolent adrenal neoplasm. 4. 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 Electronically Signed   By: Jackquline Boxer M.D.   On: 04/22/2023 15:03   DG Abd 1 View Result Date: 04/22/2023 CLINICAL DATA:  Bowel obstruction. EXAM: ABDOMEN - 1 VIEW COMPARISON:  08/10/2014 FINDINGS: There is marked gaseous distention of the stomach. Gas superimposed on the proximal stomach has a curvilinear character concerning for  pneumatosis, potentially involving the gastric cardia/fundus or posterior fundal diverticulum. Diffuse gaseous distension of small bowel evident. Gas is visible in the nondilated colon down to the level of the distal descending segment. Small volume of gas evident in the rectum. Bones are diffusely demineralized. Status post bilateral hip replacement. IMPRESSION: 1. Unusual curvilinear gas superimposed on the proximal stomach raising concern for pneumatosis. CT abdomen/pelvis (with intravenous contrast if renal function permits) recommended to further evaluate. 2. Gaseous distention of the stomach with diffuse gaseous distension of small bowel. Imaging features raise concern for severe ileus or evolving small bowel obstruction. There is a trace amount of colonic gas evident. These results will be called to the ordering clinician or representative by the Radiologist Assistant, and communication documented in the PACS or [employer]. Electronically Signed   By: Camellia Candle M.D.   On: 04/22/2023 05:26   DG Chest 2 View Result Date: 04/15/2023 CLINICAL DATA:  Cough and shortness of breath. EXAM: CHEST - 2 VIEW COMPARISON:  04/11/2023 FINDINGS: Stable cardiomediastinal contours. Small bilateral pleural effusions. Persistent bilateral lower lobe airspace opacities concerning for pneumonia. Upper lung zones appear clear. Calcified granuloma identified within the left midlung. Spondylosis noted in the thoracic spine. IMPRESSION: 1. Persistent bilateral lower lobe airspace opacities concerning for pneumonia. 2. Small bilateral pleural effusions. Electronically Signed   By: Waddell Calk M.D.   On: 04/15/2023 15:47   DG Chest Port 1 View Result Date: 04/11/2023 CLINICAL DATA:  89 year old male with hypoxia. EXAM: PORTABLE CHEST 1 VIEW COMPARISON:  CTA chest 04/05/2023 and earlier. FINDINGS: Portable AP semi upright view at 0729 hours. Stable cardiomegaly and mediastinal contours, mediastinal lymphadenopathy  demonstrated on CTA. Confluent retrocardiac opacity, not improved. No superimposed pneumothorax, pulmonary edema. Small layering pleural effusions better demonstrated by CT. Right lung base appears stable. Stable visualized osseous structures. IMPRESSION: Confluent left lung base opacity without improvement since 04/05/2023. Small pleural effusions better demonstrated by CTA. Lungs elsewhere appear stable. Stable cardiomegaly and evidence of mediastinal lymphadenopathy. Electronically Signed   By: VEAR Hurst M.D.   On: 04/11/2023 09:29   CT Angio Chest PE W and/or Wo Contrast Result Date: 04/05/2023 CLINICAL DATA:  Worsening dyspnea on exertion, new hypoxia history of prostate cancer * Tracking Code: BO * EXAM: CT ANGIOGRAPHY CHEST WITH CONTRAST TECHNIQUE: Multidetector CT imaging of the chest was performed using the standard protocol during bolus administration of intravenous contrast. Multiplanar CT image reconstructions and MIPs were obtained to evaluate the vascular anatomy. RADIATION DOSE REDUCTION: This exam was performed according to the departmental dose-optimization program which includes automated exposure control, adjustment of the mA and/or kV according to patient size and/or use of iterative reconstruction technique. CONTRAST:  75mL OMNIPAQUE  IOHEXOL  350 MG/ML SOLN COMPARISON:  CT  chest, 08/28/2019 FINDINGS: Cardiovascular: Satisfactory opacification of the pulmonary arteries to the segmental level. No evidence of pulmonary embolism. Cardiomegaly. Left coronary artery calcifications. Enlargement of the main pulmonary artery measuring up to 3.8 cm in caliber. Small pericardial effusion. Mediastinum/Nodes: Numerous prominent mediastinal and hilar lymph nodes. Thyroid gland, trachea, and esophagus demonstrate no significant findings. Lungs/Pleura: Trace bilateral pleural effusions. Diffuse bilateral bronchial wall thickening and extensive heterogeneous and ground-glass airspace opacity throughout the lung  bases (series 5, image 106). Upper Abdomen: Continued enlargement of a left adrenal mass measuring 6.6 x 5.8 cm, previously 4.8 x 4.5 cm (series 4, image 154). Musculoskeletal: No chest wall abnormality. No acute osseous findings. Review of the MIP images confirms the above findings. IMPRESSION: 1. Negative examination for pulmonary embolism. 2. Diffuse bilateral bronchial wall thickening and extensive heterogeneous and ground-glass airspace opacity throughout the lung bases, consistent with infection or aspiration. 3. Trace bilateral pleural effusions. 4. Numerous prominent mediastinal and hilar lymph nodes, possibly reactive although concerning for metastases in the setting of patient's known malignancy. 5. Continued enlargement of a left adrenal mass measuring 6.6 x 5.8 cm, previously 4.8 x 4.5 cm. Behavior over time is concerning for a metastasis or primary adrenal malignancy. 6. Cardiomegaly and coronary artery disease. 7. Enlargement of the main pulmonary artery, as can be seen in pulmonary hypertension. Aortic Atherosclerosis (ICD10-I70.0). Electronically Signed   By: Marolyn JONETTA Jaksch M.D.   On: 04/05/2023 18:48   DG Chest 2 View Result Date: 04/05/2023 CLINICAL DATA:  Shortness of breath EXAM: CHEST - 2 VIEW COMPARISON:  X-ray 10/28/2022. FINDINGS: Enlarged cardiopericardial silhouette. Small bilateral pleural effusions with some adjacent lung base opacities. Atelectasis versus infiltrate. Recommend follow-up. No pneumothorax. Dense rounded structure of the left midthorax could be a calcified lung nodule or a bone lesion. Unchanged from previous IMPRESSION: Enlarged heart with small pleural effusions and adjacent opacities. Atelectasis versus infiltrate. Recommend follow-up. Electronically Signed   By: Ranell Bring M.D.   On: 04/05/2023 16:16    Microbiology: Results for orders placed or performed during the [facility] encounter of 04/15/23  Blood culture (routine x 2)     Status: None   Collection  Time: 04/15/23  7:00 PM   Specimen: BLOOD  Result Value Ref Range Status   Specimen Description BLOOD BLOOD RIGHT ARM  Final   Special Requests   Final    BOTTLES DRAWN AEROBIC AND ANAEROBIC Blood Culture adequate volume   Culture   Final    NO GROWTH 5 DAYS Performed at [facility], [address] [place], [place] [address]    Report Status 04/20/2023 FINAL  Final  Blood culture (routine x 2)     Status: None   Collection Time: 04/15/23  7:11 PM   Specimen: BLOOD  Result Value Ref Range Status   Specimen Description BLOOD BLOOD RIGHT FOREARM  Final   Special Requests   Final    BOTTLES DRAWN AEROBIC AND ANAEROBIC Blood Culture results may not be optimal due to an inadequate volume of blood received in culture bottles   Culture   Final    NO GROWTH 5 DAYS Performed at [facility], [address], [place], [place] [address]    Report Status 04/20/2023 FINAL  Final  Resp panel by RT-PCR (RSV, Flu A&B, Covid) Anterior Nasal Swab     Status: Abnormal   Collection Time: 04/15/23  7:57 PM   Specimen: Anterior Nasal Swab  Result Value Ref Range Status   SARS Coronavirus 2  by RT PCR NEGATIVE NEGATIVE Final    Comment: (NOTE) SARS-CoV-2 target nucleic acids are NOT DETECTED.  The SARS-CoV-2 RNA is generally detectable in upper respiratory specimens during the acute phase of infection. The lowest concentration of SARS-CoV-2 viral copies this assay can detect is 138 copies/mL. A negative result does not preclude SARS-Cov-2 infection and should not be used as the sole basis for treatment or other patient management decisions. A negative result may occur with  improper specimen collection/handling, submission of specimen other than nasopharyngeal swab, presence of viral mutation(s) within the areas targeted by this assay, and inadequate number of viral copies(<138 copies/mL). A negative result must be combined with clinical observations, patient history, and  epidemiological information. The expected result is Negative.  Fact Sheet for Patients:  bloggercourse.com  Fact Sheet for Healthcare Providers:  seriousbroker.it  This test is no t yet approved or cleared by the [place]  FDA and  has been authorized for detection and/or diagnosis of SARS-CoV-2 by FDA under an Emergency Use Authorization (EUA). This EUA will remain  in effect (meaning this test can be used) for the duration of the COVID-19 declaration under Section 564(b)(1) of the Act, 21 U.S.C.section 360bbb-3(b)(1), unless the authorization is terminated  or revoked sooner.       Influenza A by PCR NEGATIVE NEGATIVE Final   Influenza B by PCR NEGATIVE NEGATIVE Final    Comment: (NOTE) The Xpert Xpress SARS-CoV-2/FLU/RSV plus assay is intended as an aid in the diagnosis of influenza from Nasopharyngeal swab specimens and should not be used as a sole basis for treatment. Nasal washings and aspirates are unacceptable for Xpert Xpress SARS-CoV-2/FLU/RSV testing.  Fact Sheet for Patients: bloggercourse.com  Fact Sheet for Healthcare Providers: seriousbroker.it  This test is not yet approved or cleared by the [place]  FDA and has been authorized for detection and/or diagnosis of SARS-CoV-2 by FDA under an Emergency Use Authorization (EUA). This EUA will remain in effect (meaning this test can be used) for the duration of the COVID-19 declaration under Section 564(b)(1) of the Act, 21 U.S.C. section 360bbb-3(b)(1), unless the authorization is terminated or revoked.     Resp Syncytial Virus by PCR POSITIVE (A) NEGATIVE Final    Comment: (NOTE) Fact Sheet for Patients: bloggercourse.com  Fact Sheet for Healthcare Providers: seriousbroker.it  This test is not yet approved or cleared by the [place]  FDA and has  been authorized for detection and/or diagnosis of SARS-CoV-2 by FDA under an Emergency Use Authorization (EUA). This EUA will remain in effect (meaning this test can be used) for the duration of the COVID-19 declaration under Section 564(b)(1) of the Act, 21 U.S.C. section 360bbb-3(b)(1), unless the authorization is terminated or revoked.  Performed at [facility], [address] [place], [place] [address]   MRSA Next Gen by PCR, Nasal     Status: None   Collection Time: 04/16/23 12:19 PM   Specimen: Nasal Mucosa; Nasal Swab  Result Value Ref Range Status   MRSA by PCR Next Gen NOT DETECTED NOT DETECTED Final    Comment: (NOTE) The GeneXpert MRSA Assay (FDA approved for NASAL specimens only), is one component of a comprehensive MRSA colonization surveillance program. It is not intended to diagnose MRSA infection nor to guide or monitor treatment for MRSA infections. Test performance is not FDA approved in patients less than 59 years old. Performed at [facility], [address] [place], [place] [address]     Labs: CBC: Recent Labs  Lab  04/24/23 0431 04/25/23 0609 04/26/23 0448  WBC 4.7 4.3 4.1  HGB 11.4* 11.2* 11.6*  HCT 35.1* 33.8* 35.5*  MCV 91.9 88.7 90.1  PLT 123* 117* 124*   Basic Metabolic Panel: Recent Labs  Lab 04/25/23 0609 04/26/23 0448 04/27/23 0445 04/28/23 0448 04/29/23 0541 04/29/23 1157  NA 140 139 140 137 141 142  K 3.3* 3.7 3.5 3.5 3.8 4.3  CL 107 105 104 100 102 102  CO2 25 23 28 26 29 30   GLUCOSE 110* 100* 99 105* 95 90  BUN 19 14 14 17 19 21   CREATININE 1.16 1.19 1.13 1.34* 1.52* 1.50*  CALCIUM  8.3* 8.5* 8.7* 8.5* 8.6* 9.3  MG 1.9 1.9 1.8 1.8  --   --    Liver Function Tests: No results for input(s): AST, ALT, ALKPHOS, BILITOT, PROT, ALBUMIN in the last 168 hours. CBG: No results for input(s): GLUCAP in the last 168 hours.  Discharge time spent: less than 30 minutes.  Signed: Murvin Mana,  MD [facility] 04/30/2023

## 2023-04-30 NOTE — Plan of Care (Signed)
  Problem: Fluid Volume: Goal: Hemodynamic stability will improve Outcome: Progressing   Problem: Respiratory: Goal: Ability to maintain adequate ventilation will improve Outcome: Progressing   Problem: Clinical Measurements: Goal: Ability to maintain clinical measurements within normal limits will improve Outcome: Progressing Goal: Will remain free from infection Outcome: Progressing   Problem: Elimination: Goal: Will not experience complications related to bowel motility Outcome: Progressing   Problem: Safety: Goal: Ability to remain free from injury will improve Outcome: Progressing   Problem: Skin Integrity: Goal: Risk for impaired skin integrity will decrease Outcome: Progressing

## 2023-04-30 NOTE — Plan of Care (Signed)
 PMT consult noted. In to see patient and he has just been discharged. Consult discontinued.

## 2023-05-01 ENCOUNTER — Telehealth: Payer: Self-pay

## 2023-05-01 NOTE — Telephone Encounter (Signed)
 The patient called in reschedule the patient appointment. I inform her that I will have to cancel the patient appointment because he is established with [facility]. She said that he only went for his throat I inform her they are his GI provider. They just refill his medication on 04/15/2024. I gave her their number.

## 2023-05-22 ENCOUNTER — Ambulatory Visit (INDEPENDENT_AMBULATORY_CARE_PROVIDER_SITE_OTHER): Payer: Medicare Other | Admitting: Urology

## 2023-05-22 ENCOUNTER — Encounter: Payer: Self-pay | Admitting: Urology

## 2023-05-22 DIAGNOSIS — R350 Frequency of micturition: Secondary | ICD-10-CM | POA: Diagnosis not present

## 2023-05-22 DIAGNOSIS — C61 Malignant neoplasm of prostate: Secondary | ICD-10-CM | POA: Diagnosis not present

## 2023-05-22 DIAGNOSIS — N401 Enlarged prostate with lower urinary tract symptoms: Secondary | ICD-10-CM | POA: Diagnosis not present

## 2023-05-22 DIAGNOSIS — E278 Other specified disorders of adrenal gland: Secondary | ICD-10-CM | POA: Diagnosis not present

## 2023-05-22 NOTE — Progress Notes (Signed)
 LILLETTE Venetia PEDLAR Plume,acting as a scribe for Rosina Riis, MD.,have documented all relevant documentation on the behalf of Rosina Riis, MD,as directed by  Rosina Riis, MD while in the presence of Rosina Riis, MD.  05/22/23 12:50 PM   Dennis [facility] 1929/09/16 [id number]  Referring provider: Rudolpho Norleen BIRCH, MD [address] [facility] [place],  [place] [address]  Chief Complaint  Patient presents with   Benign Prostatic Hypertrophy   Hospitalization Follow-up    HPI: 89 year-old male who presents for further evaluation of worsening urinary symptoms, rising PSA, and incomplete bladder emptying.   His PSA was known to be 41.7 on 01/18/2022. His PSMA PET scan at the time demonstrated avid nodal metastatic disease to the right iliac, common iliac, external, and internal iliacs, along with a perirectal lesion. He was diagnosed clinically with prostate cancer. We elected to defer biopsy. He was started on palliative ADT and his PSA trended down nicely. His urinary symptoms also improved. He was able to stop Flomax  and gemtesa .   Since his last visit, he spent a good amount of time in and out of the hospital, primarily for surgery. Initially, for left hip surgery, followed by COPD exacerbation, RSV bronchitis, acute hypoxic failure, and hospital-acquired pneumonia.   Today, he reports increased frequency of urination, getting up five times at night, which is unusual. He attributes this to drinking a Gatorade-like beverage at dinner. Normally, he gets up two to three times at night.  He has a long-standing adrenal mass that has been slowly enlarging.  On his most recent scan from April 22, 2023 it measured 5.7 x 6.5 centimeters.  This has been followed for most about 8+  years.  He had a PET scan back in 2020 at which time the mass measured 3.6 cm and had very mild hypermetabolism.  At that point in time, it was felt that it could possibly represent a lipid poor adenoma.  There  is also possible solid mass extending from the right lower pole measuring about 5 mm.   PMH: 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   Apnea, sleep 07/13/2015   Arthralgia of multiple joints 06/15/2011   Arthritis    Artificial lens present 12/08/2010   Atrial fibrillation (HCC)    Benign essential HTN 11/03/2010   CHF (congestive heart failure) (HCC)    Chronic obstructive pulmonary disease (HCC) 11/03/2010   CN (constipation) 06/15/2011   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   Dizziness 02/25/2015   GERD (gastroesophageal reflux disease)    Heart murmur    Hypertension    Interstitial lung disease (HCC) 10/13/2013   LBP (low back pain) 11/03/2010   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   Peripheral vascular disease (HCC) 11/03/2010   Retinal hemorrhage 03/16/2014    Surgical History: Past Surgical History:  Procedure Laterality Date   APPENDECTOMY     BACK SURGERY     CARPAL TUNNEL RELEASE Bilateral    EYE SURGERY Bilateral    Cataract Extraction with IOL   HERNIA REPAIR     Umbilical Hernia X 3   JOINT REPLACEMENT     bilat knees   NASAL SINUS SURGERY     REPLACEMENT TOTAL KNEE Bilateral    SHOULDER SURGERY Right    TONSILLECTOMY     TOTAL HIP ARTHROPLASTY Right 10/12/2015   Procedure: TOTAL HIP ARTHROPLASTY;  Surgeon: Lynwood SHAUNNA Hue, MD;  Location: [facility] ORS;  Service: Orthopedics;  Laterality: Right;   TOTAL HIP ARTHROPLASTY Left 02/15/2023   Procedure: TOTAL HIP ARTHROPLASTY;  Surgeon: Mardee Lynwood SQUIBB, MD;  Location: [facility] ORS;  Service: Orthopedics;  Laterality: Left;    Home Medications:  Allergies as of 05/22/2023       Reactions   Lisinopril Cough   Penicillins Hives, Swelling   IgE = 17 ((WNL) on 02/08/2023        Medication List        Accurate as of May 22, 2023 12:50 PM. If you have any questions, ask your nurse or doctor.           acetaminophen  325 MG tablet Commonly known as: TYLENOL  Take 650 mg by mouth every 6 (six) hours as needed.   albuterol  108 (90 Base) MCG/ACT inhaler Commonly known as: VENTOLIN  HFA Inhale 2 puffs into the lungs every 6 (six) hours as needed for wheezing or shortness of breath.   amLODipine  10 MG tablet Commonly known as: NORVASC  Take 10 mg by mouth daily.   CALCIUM  600 + D PO Take 1 tablet by mouth daily.   cholecalciferol  25 MCG (1000 UNIT) tablet Commonly known as: VITAMIN D3 Take 1,000 Units by mouth daily.   docusate sodium  100 MG capsule Commonly known as: COLACE Take 100 mg by mouth 2 (two) times daily.   Fish Oil 300 MG Caps Take 300 mg by mouth daily.   fluticasone  50 MCG/ACT nasal spray Commonly known as: FLONASE  Place 1 spray into both nostrils 2 (two) times daily.   furosemide  20 MG tablet Commonly known as: Lasix  Take 1 tablet (20 mg total) by mouth daily.   guaiFENesin  600 MG 12 hr tablet Commonly known as: MUCINEX  Take 600 mg by mouth 2 (two) times daily.   Lactulose  20 GM/30ML Soln Take 30 mLs (20 g total) by mouth daily as needed.   linaclotide  145 MCG Caps capsule Commonly known as: LINZESS  Take 1 capsule (145 mcg total) by mouth daily before breakfast.   omeprazole 20 MG capsule Commonly known as: PRILOSEC Take 20 mg by mouth daily.   Orgovyx  120 MG tablet Generic drug: relugolix  Take 1 tablet (120 mg total) by mouth daily.   polyethylene glycol 17 g packet Commonly known as: MIRALAX  / GLYCOLAX  Take 17 g by mouth daily.   potassium chloride  10 MEQ tablet Commonly known as: KLOR-CON  Take by mouth.   PRESERVISION AREDS 2 PO Take 1 capsule by mouth 2 (two) times daily.   rivaroxaban  20 MG Tabs tablet Commonly known as: XARELTO  Take 20 mg by mouth daily with supper.   traMADol  50 MG tablet Commonly known as: ULTRAM  Take 50 mg by mouth 2 (two) times daily as needed for moderate pain (pain score 4-6) or severe pain (pain score  7-10).   Trelegy Ellipta 100-62.5-25 MCG/ACT Aepb Generic drug: Fluticasone -Umeclidin-Vilant Inhale 1 puff into the lungs daily.        Allergies:  Allergies  Allergen Reactions   Lisinopril Cough   Penicillins Hives and Swelling    IgE = 17 ((WNL) on 02/08/2023    Family History: Family History  Problem Relation Age of Onset   Bladder Cancer Neg Hx    Kidney cancer Neg Hx    Prostate cancer Neg Hx     Social History:  reports that he has quit smoking. His smoking use included cigarettes. He has never used smokeless tobacco. He reports that he does not drink alcohol and does not use  drugs.   Physical Exam: Accompanied by his daughter today.  In wheelchair. Constitutional:  Alert and oriented, No acute distress. HEENT: [place] AT, moist mucus membranes.  Trachea midline, no masses. Neurologic: Grossly intact, no focal deficits, moving all 4 extremities. Psychiatric: Normal mood and affect.  I personally reviewed multiple studies today including CT abdomen pelvis from 2017, PET scan from 2020 as well as most recent  Assessment & Plan:    1. Prostate cancer - Continue palliative ADT as PSA levels have trended down and urinary symptoms have improved. - PSA to be checked today to monitor response to therapy. - Can cancel the lab visit in March as PSA is being drawn today -Continue Orgovyx   2. Adrenal mass - No surgical intervention planned for the adrenal lesion due to high surgical risk and slow growth of the lesion. - Consider follow-up imaging next year to monitor the adrenal lesion, though the clinical significance is uncertain given his age and medical comorbidities.  3. Nocturia - Likely related to recent intake of Gatorade-like drinks just prior to bed; advise avoiding such beverages in the evening. - Monitor urinary symptoms; no immediate changes to current management.  4.  Renal mass -Extremely small mass, less than 1 cm, incidental -This small lesion even if  malignant will not likely impact his morbidity and mortality.  As such, shared decision making not to pursue any further intervention or imaging for this.  Return in about 6 months (around 11/19/2023) for follow up or sooner if symptoms worsen of new concerns arise..  I have reviewed the above documentation for accuracy and completeness, and I agree with the above.   Rosina Riis, MD  [facility] [address] [place], [place] [address] [phone]  I spent 32 total minutes on the day of the encounter including pre-visit review of the medical record, face-to-face time with the patient, and post visit ordering of labs/imaging/tests.  Much of this time was spent reviewing records, imaging and face-to-face time.

## 2023-05-23 ENCOUNTER — Other Ambulatory Visit: Payer: Self-pay

## 2023-05-23 DIAGNOSIS — C61 Malignant neoplasm of prostate: Secondary | ICD-10-CM

## 2023-05-23 LAB — PSA: Prostate Specific Ag, Serum: 3.7 ng/mL (ref 0.0–4.0)

## 2023-05-27 ENCOUNTER — Emergency Department: Payer: Medicare Other

## 2023-05-27 ENCOUNTER — Emergency Department
Admission: EM | Admit: 2023-05-27 | Discharge: 2023-05-28 | Disposition: A | Discharge status: Home / Self Care | Payer: Medicare Other | Attending: Emergency Medicine | Admitting: Emergency Medicine

## 2023-05-27 ENCOUNTER — Other Ambulatory Visit: Payer: Self-pay

## 2023-05-27 DIAGNOSIS — Y92009 Unspecified place in unspecified non-institutional (private) residence as the place of occurrence of the external cause: Secondary | ICD-10-CM | POA: Insufficient documentation

## 2023-05-27 DIAGNOSIS — S0990XA Unspecified injury of head, initial encounter: Secondary | ICD-10-CM | POA: Diagnosis not present

## 2023-05-27 DIAGNOSIS — Z7901 Long term (current) use of anticoagulants: Secondary | ICD-10-CM | POA: Insufficient documentation

## 2023-05-27 DIAGNOSIS — I110 Hypertensive heart disease with heart failure: Secondary | ICD-10-CM | POA: Diagnosis not present

## 2023-05-27 DIAGNOSIS — J449 Chronic obstructive pulmonary disease, unspecified: Secondary | ICD-10-CM | POA: Diagnosis not present

## 2023-05-27 DIAGNOSIS — G9600 Cerebrospinal fluid leak, unspecified: Secondary | ICD-10-CM | POA: Diagnosis not present

## 2023-05-27 DIAGNOSIS — G9608 Other cranial cerebrospinal fluid leak: Secondary | ICD-10-CM

## 2023-05-27 DIAGNOSIS — W19XXXA Unspecified fall, initial encounter: Secondary | ICD-10-CM

## 2023-05-27 DIAGNOSIS — I509 Heart failure, unspecified: Secondary | ICD-10-CM | POA: Insufficient documentation

## 2023-05-27 DIAGNOSIS — W1830XA Fall on same level, unspecified, initial encounter: Secondary | ICD-10-CM | POA: Insufficient documentation

## 2023-05-27 DIAGNOSIS — R519 Headache, unspecified: Secondary | ICD-10-CM | POA: Diagnosis present

## 2023-05-27 MED ORDER — ACETAMINOPHEN 325 MG PO TABS
650.0000 mg | ORAL_TABLET | Freq: Once | ORAL | Status: AC
  Administered 2023-05-27: 650 mg via ORAL
  Filled 2023-05-27: qty 2

## 2023-05-27 NOTE — ED Provider Triage Note (Signed)
 Emergency Medicine Provider Triage Evaluation Note  Dennis [facility] , a 89 y.o. male  was evaluated in triage.  Pt complains of fall that occurred today. He went to lean against a table that had wheels on it and the table moved causing him to fall. He does take a blood thinner so wanted to be checked out.  Review of Systems  Positive: headache Negative: Blurry vision, nausea, vomiting  Physical Exam  There were no vitals taken for this visit. Gen:   Awake, no distress   Resp:  Normal effort  MSK:   Moves extremities without difficulty  Other:    Medical Decision Making  Medically screening exam initiated at 5:36 PM.  Appropriate orders placed.  [name] was informed that the remainder of the evaluation will be completed by another provider, this initial triage assessment does not replace that evaluation, and the importance of remaining in the ED until their evaluation is complete.     Phyliss Breen, PA-C 05/27/23 1738

## 2023-05-27 NOTE — ED Triage Notes (Signed)
 First nurse note: Pt to ED via [facility] from home. Pt reports fall by slipping out of roll aider and hit back of head on wall. Pt denies LOC. HA 4/10 and dizzy on arrival   HR 59 97% RA 172/74 CBG 125 20g LAC

## 2023-05-27 NOTE — ED Provider Notes (Signed)
 [facility] Emergency Department Provider Note     Event Date/Time   First MD Initiated Contact with Patient 05/27/23 1925     (approximate)   History   Fall   HPI  Dennis Savage is a 89 y.o. male with a history of obesity, A-fib on anticoagulation, COPD, CHF, HTN, and GERD who presents to the ED following mechanical fall at home.  Patient reports he was ambulating with his walker, when he leaned against a wheeled table that was not locked.  He fell, landing on his buttocks and hit his head on the table.  He does endorse use of blood thinners but denies any LOC.  Patient only called EMS because he was unable to get himself off the floor under his own power.  He denies any weakness preceding the fall.  He denies any other injuries at this time.  He presents only with complaints of a mild headache at this time.  Physical Exam   Triage Vital Signs: ED Triage Vitals [05/27/23 1737]  Encounter Vitals Group     BP 135/66     Systolic BP Percentile      Diastolic BP Percentile      Pulse Rate (!) 56     Resp 17     Temp 98.1 F (36.7 C)     Temp Source Oral     SpO2 97 %     Weight 182 lb (82.6 kg)     Height 5\' 9"  (1.753 m)     Head Circumference      Peak Flow      Pain Score 2     Pain Loc      Pain Education      Exclude from Growth Chart     Most recent vital signs: Vitals:   05/27/23 1737  BP: 135/66  Pulse: (!) 56  Resp: 17  Temp: 98.1 F (36.7 C)  SpO2: 97%    General Awake, no distress.  NAD.  A & O x 4 HEENT NCAT. PERRL. EOMI. No rhinorrhea. Mucous membranes are moist.  CV:  Good peripheral perfusion. RRR RESP:  Normal effort. CTA ABD:  No distention.  MSK:  Active range of motion of all extremities NEURO: Cranial nerves II to XII grossly intact.   ED Results / Procedures / Treatments   Labs (all labs ordered are listed, but only abnormal results are displayed) Labs Reviewed - No data to  display   EKG   RADIOLOGY  I personally viewed and evaluated these images as part of my medical decision making, as well as reviewing the written report by the radiologist.  ED Provider Interpretation: Acute versus subacute subdural hygroma.  No cervical spine arthropathy noted.  CT Head Wo Contrast Result Date: 05/27/2023 CLINICAL DATA:  Head trauma, minor (Age >= 65y); Neck trauma (Age >= 65y). Fall. On blood thinner. EXAM: CT HEAD WITHOUT CONTRAST CT CERVICAL SPINE WITHOUT CONTRAST TECHNIQUE: Multidetector CT imaging of the head and cervical spine was performed following the standard protocol without intravenous contrast. Multiplanar CT image reconstructions of the cervical spine were also generated. RADIATION DOSE REDUCTION: This exam was performed according to the departmental dose-optimization program which includes automated exposure control, adjustment of the mA and/or kV according to patient size and/or use of iterative reconstruction technique. COMPARISON:  CT head and cervical spine 09/04/2020 FINDINGS: CT HEAD FINDINGS Brain: There is no evidence of an acute infarct, intracranial hemorrhage, mass, or midline shift. There is  mild cerebral atrophy. Cerebral white matter hypodensities are similar to the prior CT and are nonspecific but compatible with mild chronic small vessel ischemic disease. There are chronic lacunar infarcts in the basal ganglia. Prominent extra-axial CSF over the right cerebral convexity measures up to 1 cm in thickness, stable to slightly increased from the prior CT and with slight deformity of the underlying frontal and parietal lobes favored to reflect a small subdural hygroma superimposed on subarachnoid space enlargement from cerebral atrophy. A smaller amount of extra-axial CSF over the left frontal convexity has at most minimally increased from the prior CT and is indeterminate for a tiny subdural hygroma versus atrophy. Vascular: Calcified atherosclerosis at the  skull base. No hyperdense vessel. Skull: No acute fracture or suspicious osseous lesion. Sinuses/Orbits: Findings of chronic sinusitis and prior functional endoscopic sinus surgery. Polypoid mucosal thickening superiorly in the right nasal cavity and posteriorly in the left nasal cavity. Moderate mucosal thickening in the left maxillary sinus. Clear mastoid air cells. Bilateral cataract extraction. Other: None. CT CERVICAL SPINE FINDINGS Alignment: Cervical spine straightening. Mild left convex curvature of the cervical spine. No acute traumatic malalignment. Skull base and vertebrae: No acute fracture or suspicious osseous lesion. Moderate C1-2 arthropathy. 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 Soft tissues and spinal canal: No prevertebral fluid or swelling. No visible canal hematoma. Disc levels: Widespread, advanced cervical facet arthrosis. Right-sided facet and partial interbody ankylosis at C2-3. Advanced disc degeneration at C5-6 and C6-7. Moderate to severe multilevel neural foraminal stenosis. Mild-to-moderate spinal stenosis at C6-7. Upper chest: Clear lung apices. Other: None. IMPRESSION: 1. No evidence of acute intracranial abnormality or cervical spine fracture. 2. Stable to slight enlargement of a suspected small subdural hygroma over the right cerebral convexity. No midline shift. Electronically Signed   By: Aundra Lee M.D.   On: 05/27/2023 19:11   CT Cervical Spine Wo Contrast Result Date: 05/27/2023 CLINICAL DATA:  Head trauma, minor (Age >= 65y); Neck trauma (Age >= 65y). Fall. On blood thinner. EXAM: CT HEAD WITHOUT CONTRAST CT CERVICAL SPINE WITHOUT CONTRAST TECHNIQUE: Multidetector CT imaging of the head and cervical spine was performed following the standard protocol without intravenous contrast. Multiplanar CT image reconstructions of the cervical spine were also generated. RADIATION DOSE REDUCTION: This exam was performed according to the departmental dose-optimization program which includes  automated exposure control, adjustment of the mA and/or kV according to patient size and/or use of iterative reconstruction technique. COMPARISON:  CT head and cervical spine 09/04/2020 FINDINGS: CT HEAD FINDINGS Brain: There is no evidence of an acute infarct, intracranial hemorrhage, mass, or midline shift. There is mild cerebral atrophy. Cerebral white matter hypodensities are similar to the prior CT and are nonspecific but compatible with mild chronic small vessel ischemic disease. There are chronic lacunar infarcts in the basal ganglia. Prominent extra-axial CSF over the right cerebral convexity measures up to 1 cm in thickness, stable to slightly increased from the prior CT and with slight deformity of the underlying frontal and parietal lobes favored to reflect a small subdural hygroma superimposed on subarachnoid space enlargement from cerebral atrophy. A smaller amount of extra-axial CSF over the left frontal convexity has at most minimally increased from the prior CT and is indeterminate for a tiny subdural hygroma versus atrophy. Vascular: Calcified atherosclerosis at the skull base. No hyperdense vessel. Skull: No acute fracture or suspicious osseous lesion. Sinuses/Orbits: Findings of chronic sinusitis and prior functional endoscopic sinus surgery. Polypoid mucosal thickening superiorly in the right nasal cavity and posteriorly in  the left nasal cavity. Moderate mucosal thickening in the left maxillary sinus. Clear mastoid air cells. Bilateral cataract extraction. Other: None. CT CERVICAL SPINE FINDINGS Alignment: Cervical spine straightening. Mild left convex curvature of the cervical spine. No acute traumatic malalignment. Skull base and vertebrae: No acute fracture or suspicious osseous lesion. Moderate C1-2 arthropathy. 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 Soft tissues and spinal canal: No prevertebral fluid or swelling. No visible canal hematoma. Disc levels: Widespread, advanced cervical facet arthrosis.  Right-sided facet and partial interbody ankylosis at C2-3. Advanced disc degeneration at C5-6 and C6-7. Moderate to severe multilevel neural foraminal stenosis. Mild-to-moderate spinal stenosis at C6-7. Upper chest: Clear lung apices. Other: None. IMPRESSION: 1. No evidence of acute intracranial abnormality or cervical spine fracture. 2. Stable to slight enlargement of a suspected small subdural hygroma over the right cerebral convexity. No midline shift. Electronically Signed   By: Aundra Lee M.D.   On: 05/27/2023 19:11     PROCEDURES:  Critical Care performed: No  Procedures   MEDICATIONS ORDERED IN ED: Medications  acetaminophen  (TYLENOL ) tablet 650 mg (650 mg Oral Given 05/27/23 2034)     IMPRESSION / MDM / ASSESSMENT AND PLAN / ED COURSE  I reviewed the triage vital signs and the nursing notes.                              Differential diagnosis includes, but is not limited to, intracranial hemorrhage, meningitis/encephalitis, previous head trauma, cavernous venous thrombosis, tension headache, temporal arteritis, migraine or migraine equivalent, idiopathic intracranial hypertension, and non-specific headache.  Patient's presentation is most consistent with acute complicated illness / injury requiring diagnostic workup.  ----------------------------------------- 8:18 PM on 05/27/2023 ----------------------------------------- S/W Dr. Henderson Lock ([facility]): He recommends repeat head CT in 6 hours.  He suspects patient will be stable and appropriate for outpatient management.  Patient is stable, with minimal pain, and agreeable to the plan at this time.  I will update his adult daughter when she arrives at bedside.  ----------------------------------------- 10:23 PM on 05/27/2023 ----------------------------------------- I will transfer patient care to the oncoming attending who will follow up with disposition the patient following repeat head CT at 0100 hrs.  Patient's  diagnosis is consistent with minor head injury following a mechanical fall.  Initial head CT with concern for possible expansion of a stable chronic hygroma.  Neurosurgery recommended repeat serial head CT to assess for stability.  If CT scan is normal and reassuring patient will be discharged to follow-up with neurosurgery as discussed. Patient is given ED precautions to return to the ED for any worsening or new symptoms.   FINAL CLINICAL IMPRESSION(S) / ED DIAGNOSES   Final diagnoses:  Minor head injury, initial encounter  Fall in home, initial encounter  Subdural hygroma     Rx / DC Orders   ED Discharge Orders     None        Note:  This document was prepared using Dragon voice recognition software and may include unintentional dictation errors.    May Sparks, PA-C 05/27/23 2225    Bradler, Evan K, MD 05/27/23 [phone]

## 2023-05-27 NOTE — ED Triage Notes (Signed)
 Pt sts that he was walking with his walker and leaned up against a table that he forgot was on wheels and the table started to move and he fell. Pt sts that he hit the back of his head and is on blood thinners so he thought he needed to get checked out. Pt is A/ox4 at this time.

## 2023-05-28 ENCOUNTER — Emergency Department: Payer: Medicare Other

## 2023-05-28 ENCOUNTER — Other Ambulatory Visit: Payer: Self-pay

## 2023-05-28 ENCOUNTER — Telehealth: Payer: Self-pay | Admitting: Neurosurgery

## 2023-05-28 DIAGNOSIS — S0990XA Unspecified injury of head, initial encounter: Secondary | ICD-10-CM | POA: Diagnosis not present

## 2023-05-28 DIAGNOSIS — G9608 Other cranial cerebrospinal fluid leak: Secondary | ICD-10-CM

## 2023-05-28 NOTE — ED Provider Notes (Signed)
-----------------------------------------   2:02 AM on 05/28/2023 -----------------------------------------  Patient signed out pending repeat head CT.  Repeat head CT shows no interval change and hygroma.  Patient otherwise stable with no acute complaints.  Updated neurosurgeon and will discharge with outpatient neurosurgery follow-up.  Patient agreeable with plan and given strict return precautions.   Janith Lima, MD 05/28/23 [phone]

## 2023-05-28 NOTE — Telephone Encounter (Signed)
Dennis Kim, MD  [facility]: APP okay Timeline: 2--4 weeks Tests to order: repeat head CT

## 2023-05-28 NOTE — ED Notes (Signed)
Pt's sister, [name], called and informed of pt's pending discharge. She advised she would be on the way.

## 2023-05-28 NOTE — Telephone Encounter (Signed)
Repeat CT Head WO Contrast ordered.

## 2023-05-28 NOTE — Discharge Instructions (Signed)
Neurosurgery should call you to set up a follow-up appointment.  Please return for any worsening symptoms.

## 2023-06-10 ENCOUNTER — Ambulatory Visit: Payer: Medicare Other | Admitting: Gastroenterology

## 2023-06-21 ENCOUNTER — Ambulatory Visit: Payer: Medicare Other | Attending: Neurosurgery

## 2023-06-24 ENCOUNTER — Inpatient Hospital Stay
Admission: RE | Admit: 2023-06-24 | Discharge: 2023-06-24 | Disposition: A | Discharge status: Home / Self Care | Payer: Self-pay | Source: Ambulatory Visit | Attending: Physician Assistant | Admitting: Physician Assistant

## 2023-06-24 ENCOUNTER — Other Ambulatory Visit: Payer: Self-pay

## 2023-06-24 DIAGNOSIS — Z049 Encounter for examination and observation for unspecified reason: Secondary | ICD-10-CM

## 2023-06-24 NOTE — Telephone Encounter (Signed)
 Has new patient appointment for hospital follow up for subdural hygroma with Dennis Savage 3/14. Was supposed to have repeat head CT 3/7, but no showed appt for head CT.  *He also had a head CT 06/07/23 at [facility]. This has been loaded to his chart.

## 2023-06-26 ENCOUNTER — Other Ambulatory Visit: Payer: Medicare Other

## 2023-06-28 ENCOUNTER — Ambulatory Visit: Payer: Medicare Other | Admitting: Physician Assistant

## 2023-07-18 ENCOUNTER — Other Ambulatory Visit: Payer: Self-pay

## 2023-07-18 ENCOUNTER — Encounter: Payer: Self-pay | Admitting: Emergency Medicine

## 2023-07-18 ENCOUNTER — Emergency Department
Admission: EM | Admit: 2023-07-18 | Discharge: 2023-07-18 | Disposition: A | Discharge status: Home / Self Care | Attending: Emergency Medicine | Admitting: Emergency Medicine

## 2023-07-18 DIAGNOSIS — R7989 Other specified abnormal findings of blood chemistry: Secondary | ICD-10-CM | POA: Diagnosis not present

## 2023-07-18 DIAGNOSIS — D72829 Elevated white blood cell count, unspecified: Secondary | ICD-10-CM | POA: Diagnosis not present

## 2023-07-18 DIAGNOSIS — I4891 Unspecified atrial fibrillation: Secondary | ICD-10-CM | POA: Diagnosis not present

## 2023-07-18 DIAGNOSIS — R001 Bradycardia, unspecified: Secondary | ICD-10-CM | POA: Diagnosis present

## 2023-07-18 LAB — BASIC METABOLIC PANEL WITH GFR
Anion gap: 11 (ref 5–15)
BUN: 52 mg/dL — ABNORMAL HIGH (ref 8–23)
CO2: 26 mmol/L (ref 22–32)
Calcium: 9 mg/dL (ref 8.9–10.3)
Chloride: 103 mmol/L (ref 98–111)
Creatinine, Ser: 1.41 mg/dL — ABNORMAL HIGH (ref 0.61–1.24)
GFR, Estimated: 47 mL/min — ABNORMAL LOW (ref >60)
Glucose, Bld: 155 mg/dL — ABNORMAL HIGH (ref 70–99)
Potassium: 4.6 mmol/L (ref 3.5–5.1)
Sodium: 140 mmol/L (ref 135–145)

## 2023-07-18 LAB — CBC WITH DIFFERENTIAL/PLATELET
Abs Immature Granulocytes: 0.72 10*3/uL — ABNORMAL HIGH (ref 0.00–0.07)
Basophils Absolute: 0 10*3/uL (ref 0.0–0.1)
Basophils Relative: 0 %
Eosinophils Absolute: 0 10*3/uL (ref 0.0–0.5)
Eosinophils Relative: 0 %
HCT: 33.7 % — ABNORMAL LOW (ref 39.0–52.0)
Hemoglobin: 10.6 g/dL — ABNORMAL LOW (ref 13.0–17.0)
Immature Granulocytes: 5 %
Lymphocytes Relative: 13 %
Lymphs Abs: 1.8 10*3/uL (ref 0.7–4.0)
MCH: 28.6 pg (ref 26.0–34.0)
MCHC: 31.5 g/dL (ref 30.0–36.0)
MCV: 90.8 fL (ref 80.0–100.0)
Monocytes Absolute: 0.4 10*3/uL (ref 0.1–1.0)
Monocytes Relative: 3 %
Neutro Abs: 11.4 10*3/uL — ABNORMAL HIGH (ref 1.7–7.7)
Neutrophils Relative %: 79 %
Platelets: 342 10*3/uL (ref 150–400)
RBC: 3.71 MIL/uL — ABNORMAL LOW (ref 4.22–5.81)
RDW: 14.6 % (ref 11.5–15.5)
WBC: 14.3 10*3/uL — ABNORMAL HIGH (ref 4.0–10.5)
nRBC: 0 % (ref 0.0–0.2)

## 2023-07-18 NOTE — ED Triage Notes (Signed)
 First Nurse Note;  Pt via [facility] from [facility] just arrived 2 hours ago. And arrived as they were doing his VS noticed HR of 40. EKG looks like ventricular bigeminy. Pt is asymptomatic and takes Xarelto for a fib. Pt is A&Ox4 and NAD  40 HR,  128/91 HR  98% on RA 18G L AC

## 2023-07-18 NOTE — ED Notes (Signed)
 This RN called [employer] and gave report to [employer]. Daughter said she will take pt home

## 2023-07-18 NOTE — ED Provider Notes (Signed)
 [facility] Provider Note    Event Date/Time   First MD Initiated Contact with Patient 07/18/23 1736     (approximate)   History   Bradycardia   HPI  Dennis Savage is a 89 y.o. male who was sent to the emergency department from [facility] because of concerns for slow heart rate upon arrival to [facility].  Patient had just been discharged from [facility] earlier today.  Per chart review he had been having heart rates in the low 60s and high 50s.  He states he was told his heart rate was in the 30s at [facility].  However he denies any symptoms.     Physical Exam   Triage Vital Signs: ED Triage Vitals  Encounter Vitals Group     BP 07/18/23 1720 (!) 134/58     Systolic BP Percentile --      Diastolic BP Percentile --      Pulse Rate 07/18/23 1720 (!) 37     Resp --      Temp 07/18/23 1720 97.6 F (36.4 C)     Temp Source 07/18/23 1720 Oral     SpO2 07/18/23 1720 97 %     Weight --      Height --      Head Circumference --      Peak Flow --      Pain Score 07/18/23 1722 0     Pain Loc --      Pain Education --      Exclude from Growth Chart --     Most recent vital signs: Vitals:   07/18/23 1720  BP: (!) 134/58  Pulse: (!) 37  Temp: 97.6 F (36.4 C)  SpO2: 97%   General: Awake, alert, oriented. CV:  Good peripheral perfusion. Irregular rhythm. Resp:  Normal effort.  Abd:  No distention.    ED Results / Procedures / Treatments   Labs (all labs ordered are listed, but only abnormal results are displayed) Labs Reviewed  BASIC METABOLIC PANEL WITH GFR - Abnormal; Notable for the following components:      Result Value   Glucose, Bld 155 (*)    BUN 52 (*)    Creatinine, Ser 1.41 (*)    GFR, Estimated 47 (*)    All other components within normal limits  CBC WITH DIFFERENTIAL/PLATELET - Abnormal; Notable for the following components:   WBC 14.3 (*)    RBC 3.71 (*)    Hemoglobin 10.6 (*)    HCT 33.7 (*)     Neutro Abs 11.4 (*)    Abs Immature Granulocytes 0.72 (*)    All other components within normal limits     EKG  I, Phineas Semen, attending physician, personally viewed and interpreted this EKG  EKG Time: 1725 Rate: 55 Rhythm: atrial fibrillation with PVC Axis: normal Intervals: qtc 480 QRS: RBBB ST changes: no st elevation Impression: abnormal ekg    RADIOLOGY No  PROCEDURES:  Critical Care performed: No   MEDICATIONS ORDERED IN ED: Medications - No data to display   IMPRESSION / MDM / ASSESSMENT AND PLAN / ED COURSE  I reviewed the triage vital signs and the nursing notes.                              Differential diagnosis includes, but is not limited to, arrhythmia, electrolyte abnormality  Patient's presentation is most consistent with  acute presentation with potential threat to life or bodily function.   The patient is on the cardiac monitor to evaluate for evidence of arrhythmia and/or significant heart rate changes.  Patient was sent to the emergency department today from [facility] upon arrival because of concerns for low heart rate.  Patient denies any symptoms.  EKG here shows atrial fibrillation with frequent PVCs.  Heart rate is essentially what it has been at [facility].  He was placed on the cardiac monitor and continue to have heart rate in the high 50s and low 60s.  I reviewed the strips from EMS and again it appears that his heart rate was in the 50s or 60s for EMS.  Unclear if perhaps the machine was not picking up the ventricular beats.  I did to have a discussion with patient.  Had blood work performed roughly 12 hours ago at [facility] without any concerning electrolyte abnormalities.  He did request a repeat blood work be performed here.  If blood work is without concerning findings I do think patient would be safely discharged back to [facility].    Blood work shows slight leukocytosis however patient has been placed on steroids. No  obvious source of infection. BUN and Creatinine elevated however not significantly changed from discharge blood work roughly 12 hours ago. Patient continues to feel well and has not had any significant bradycardia on the monitor while here in the emergency department. s  FINAL CLINICAL IMPRESSION(S) / ED DIAGNOSES   Final diagnoses:  Atrial fibrillation, unspecified type [facility])     Note:  This document was prepared using Dragon voice recognition software and may include unintentional dictation errors.    Phineas Semen, MD 07/18/23 [phone]

## 2023-07-18 NOTE — ED Triage Notes (Signed)
 See first nurse note. Patient recently hospitalized at [facility] and d/c to [facility]. While doing admission vitals at [facility], there were concerns for bradycardia and ventricular bigeminy.   Denies any symptoms at this time.

## 2023-07-18 NOTE — ED Notes (Signed)
 This RN called [facility] to give report. On hold for pt nurse for approximately 10 minutes without answer.

## 2023-07-31 ENCOUNTER — Encounter: Payer: Self-pay | Admitting: Urology

## 2023-08-08 ENCOUNTER — Inpatient Hospital Stay
Admission: EM | Admit: 2023-08-08 | Discharge: 2023-08-23 | DRG: 291 | Disposition: A | Discharge status: Skilled Nursing Facility | Attending: Internal Medicine | Admitting: Internal Medicine

## 2023-08-08 ENCOUNTER — Other Ambulatory Visit: Payer: Self-pay

## 2023-08-08 ENCOUNTER — Emergency Department

## 2023-08-08 DIAGNOSIS — E871 Hypo-osmolality and hyponatremia: Secondary | ICD-10-CM | POA: Diagnosis present

## 2023-08-08 DIAGNOSIS — N183 Chronic kidney disease, stage 3 unspecified: Secondary | ICD-10-CM | POA: Diagnosis not present

## 2023-08-08 DIAGNOSIS — I5033 Acute on chronic diastolic (congestive) heart failure: Secondary | ICD-10-CM | POA: Diagnosis not present

## 2023-08-08 DIAGNOSIS — J439 Emphysema, unspecified: Secondary | ICD-10-CM | POA: Diagnosis present

## 2023-08-08 DIAGNOSIS — I10 Essential (primary) hypertension: Secondary | ICD-10-CM | POA: Diagnosis not present

## 2023-08-08 DIAGNOSIS — I480 Paroxysmal atrial fibrillation: Secondary | ICD-10-CM | POA: Diagnosis present

## 2023-08-08 DIAGNOSIS — I739 Peripheral vascular disease, unspecified: Secondary | ICD-10-CM | POA: Diagnosis present

## 2023-08-08 DIAGNOSIS — Z8546 Personal history of malignant neoplasm of prostate: Secondary | ICD-10-CM | POA: Diagnosis not present

## 2023-08-08 DIAGNOSIS — H9190 Unspecified hearing loss, unspecified ear: Secondary | ICD-10-CM | POA: Diagnosis present

## 2023-08-08 DIAGNOSIS — Z96643 Presence of artificial hip joint, bilateral: Secondary | ICD-10-CM | POA: Diagnosis present

## 2023-08-08 DIAGNOSIS — Z7189 Other specified counseling: Secondary | ICD-10-CM | POA: Diagnosis not present

## 2023-08-08 DIAGNOSIS — I4891 Unspecified atrial fibrillation: Secondary | ICD-10-CM

## 2023-08-08 DIAGNOSIS — J810 Acute pulmonary edema: Secondary | ICD-10-CM | POA: Diagnosis not present

## 2023-08-08 DIAGNOSIS — J449 Chronic obstructive pulmonary disease, unspecified: Secondary | ICD-10-CM | POA: Diagnosis present

## 2023-08-08 DIAGNOSIS — J9601 Acute respiratory failure with hypoxia: Principal | ICD-10-CM | POA: Diagnosis present

## 2023-08-08 DIAGNOSIS — I482 Chronic atrial fibrillation, unspecified: Secondary | ICD-10-CM | POA: Diagnosis present

## 2023-08-08 DIAGNOSIS — Z7901 Long term (current) use of anticoagulants: Secondary | ICD-10-CM | POA: Diagnosis not present

## 2023-08-08 DIAGNOSIS — Z7951 Long term (current) use of inhaled steroids: Secondary | ICD-10-CM

## 2023-08-08 DIAGNOSIS — I2489 Other forms of acute ischemic heart disease: Secondary | ICD-10-CM | POA: Diagnosis present

## 2023-08-08 DIAGNOSIS — C61 Malignant neoplasm of prostate: Secondary | ICD-10-CM | POA: Diagnosis present

## 2023-08-08 DIAGNOSIS — Z91148 Patient's other noncompliance with medication regimen for other reason: Secondary | ICD-10-CM

## 2023-08-08 DIAGNOSIS — Z79899 Other long term (current) drug therapy: Secondary | ICD-10-CM | POA: Diagnosis not present

## 2023-08-08 DIAGNOSIS — Z66 Do not resuscitate: Secondary | ICD-10-CM | POA: Diagnosis present

## 2023-08-08 DIAGNOSIS — N1831 Chronic kidney disease, stage 3a: Secondary | ICD-10-CM | POA: Diagnosis present

## 2023-08-08 DIAGNOSIS — Z1152 Encounter for screening for COVID-19: Secondary | ICD-10-CM

## 2023-08-08 DIAGNOSIS — K219 Gastro-esophageal reflux disease without esophagitis: Secondary | ICD-10-CM | POA: Diagnosis present

## 2023-08-08 DIAGNOSIS — I214 Non-ST elevation (NSTEMI) myocardial infarction: Secondary | ICD-10-CM | POA: Diagnosis not present

## 2023-08-08 DIAGNOSIS — L89151 Pressure ulcer of sacral region, stage 1: Secondary | ICD-10-CM | POA: Diagnosis present

## 2023-08-08 DIAGNOSIS — Z96653 Presence of artificial knee joint, bilateral: Secondary | ICD-10-CM | POA: Diagnosis present

## 2023-08-08 DIAGNOSIS — Z87891 Personal history of nicotine dependence: Secondary | ICD-10-CM

## 2023-08-08 DIAGNOSIS — L8990 Pressure ulcer of unspecified site, unspecified stage: Secondary | ICD-10-CM | POA: Diagnosis present

## 2023-08-08 DIAGNOSIS — J9621 Acute and chronic respiratory failure with hypoxia: Secondary | ICD-10-CM | POA: Diagnosis present

## 2023-08-08 DIAGNOSIS — R300 Dysuria: Secondary | ICD-10-CM | POA: Diagnosis present

## 2023-08-08 DIAGNOSIS — I130 Hypertensive heart and chronic kidney disease with heart failure and stage 1 through stage 4 chronic kidney disease, or unspecified chronic kidney disease: Secondary | ICD-10-CM | POA: Diagnosis present

## 2023-08-08 DIAGNOSIS — I493 Ventricular premature depolarization: Secondary | ICD-10-CM | POA: Diagnosis present

## 2023-08-08 DIAGNOSIS — Z515 Encounter for palliative care: Secondary | ICD-10-CM | POA: Diagnosis not present

## 2023-08-08 LAB — CBC WITH DIFFERENTIAL/PLATELET
Abs Immature Granulocytes: 0.1 10*3/uL — ABNORMAL HIGH (ref 0.00–0.07)
Basophils Absolute: 0 10*3/uL (ref 0.0–0.1)
Basophils Relative: 0 %
Eosinophils Absolute: 0 10*3/uL (ref 0.0–0.5)
Eosinophils Relative: 0 %
HCT: 38.5 % — ABNORMAL LOW (ref 39.0–52.0)
Hemoglobin: 12.1 g/dL — ABNORMAL LOW (ref 13.0–17.0)
Immature Granulocytes: 1 %
Lymphocytes Relative: 16 %
Lymphs Abs: 1.6 10*3/uL (ref 0.7–4.0)
MCH: 28.3 pg (ref 26.0–34.0)
MCHC: 31.4 g/dL (ref 30.0–36.0)
MCV: 90.2 fL (ref 80.0–100.0)
Monocytes Absolute: 1.6 10*3/uL — ABNORMAL HIGH (ref 0.1–1.0)
Monocytes Relative: 16 %
Neutro Abs: 6.8 10*3/uL (ref 1.7–7.7)
Neutrophils Relative %: 67 %
Platelets: 157 10*3/uL (ref 150–400)
RBC: 4.27 MIL/uL (ref 4.22–5.81)
RDW: 17.1 % — ABNORMAL HIGH (ref 11.5–15.5)
WBC: 10.1 10*3/uL (ref 4.0–10.5)
nRBC: 0 % (ref 0.0–0.2)

## 2023-08-08 LAB — COMPREHENSIVE METABOLIC PANEL WITH GFR
ALT: 20 U/L (ref 0–44)
AST: 24 U/L (ref 15–41)
Albumin: 3.5 g/dL (ref 3.5–5.0)
Alkaline Phosphatase: 55 U/L (ref 38–126)
Anion gap: 8 (ref 5–15)
BUN: 30 mg/dL — ABNORMAL HIGH (ref 8–23)
CO2: 25 mmol/L (ref 22–32)
Calcium: 9.1 mg/dL (ref 8.9–10.3)
Chloride: 106 mmol/L (ref 98–111)
Creatinine, Ser: 1.37 mg/dL — ABNORMAL HIGH (ref 0.61–1.24)
GFR, Estimated: 49 mL/min — ABNORMAL LOW (ref >60)
Glucose, Bld: 177 mg/dL — ABNORMAL HIGH (ref 70–99)
Potassium: 3.8 mmol/L (ref 3.5–5.1)
Sodium: 139 mmol/L (ref 135–145)
Total Bilirubin: 1.4 mg/dL — ABNORMAL HIGH (ref 0.0–1.2)
Total Protein: 6.3 g/dL — ABNORMAL LOW (ref 6.5–8.1)

## 2023-08-08 LAB — TROPONIN I (HIGH SENSITIVITY)
Troponin I (High Sensitivity): 395 ng/L (ref <18)
Troponin I (High Sensitivity): 483 ng/L (ref <18)

## 2023-08-08 LAB — BLOOD GAS, VENOUS

## 2023-08-08 LAB — RESP PANEL BY RT-PCR (RSV, FLU A&B, COVID)  RVPGX2
Influenza A by PCR: NEGATIVE
Influenza B by PCR: NEGATIVE
Resp Syncytial Virus by PCR: NEGATIVE
SARS Coronavirus 2 by RT PCR: NEGATIVE

## 2023-08-08 LAB — BRAIN NATRIURETIC PEPTIDE: B Natriuretic Peptide: 1457.8 pg/mL — ABNORMAL HIGH (ref 0.0–100.0)

## 2023-08-08 MED ORDER — FUROSEMIDE 10 MG/ML IJ SOLN
40.0000 mg | Freq: Once | INTRAMUSCULAR | Status: AC
  Administered 2023-08-08: 40 mg via INTRAVENOUS
  Filled 2023-08-08: qty 4

## 2023-08-08 MED ORDER — ONDANSETRON HCL 4 MG PO TABS: 4.0000 mg | ORAL_TABLET | Freq: Four times a day (QID) | ORAL | Status: DC | PRN

## 2023-08-08 MED ORDER — SODIUM CHLORIDE 0.9% FLUSH: 3.0000 mL | INTRAVENOUS | Status: DC | PRN

## 2023-08-08 MED ORDER — ACETAMINOPHEN 325 MG PO TABS
650.0000 mg | ORAL_TABLET | Freq: Four times a day (QID) | ORAL | Status: DC | PRN
  Administered 2023-08-11 – 2023-08-19 (×4): 650 mg via ORAL
  Filled 2023-08-08 (×4): qty 2

## 2023-08-08 MED ORDER — ENOXAPARIN SODIUM 40 MG/0.4ML IJ SOSY: 40.0000 mg | PREFILLED_SYRINGE | INTRAMUSCULAR | Status: DC

## 2023-08-08 MED ORDER — SODIUM CHLORIDE 0.9 % IV SOLN: 250.0000 mL | INTRAVENOUS | Status: AC | PRN

## 2023-08-08 MED ORDER — IPRATROPIUM-ALBUTEROL 0.5-2.5 (3) MG/3ML IN SOLN
3.0000 mL | RESPIRATORY_TRACT | Status: DC | PRN
  Filled 2023-08-08: qty 3

## 2023-08-08 MED ORDER — SODIUM CHLORIDE 0.9% FLUSH
3.0000 mL | Freq: Two times a day (BID) | INTRAVENOUS | Status: DC
  Administered 2023-08-08 – 2023-08-23 (×29): 3 mL via INTRAVENOUS

## 2023-08-08 MED ORDER — IPRATROPIUM-ALBUTEROL 0.5-2.5 (3) MG/3ML IN SOLN
3.0000 mL | Freq: Once | RESPIRATORY_TRACT | Status: AC
  Administered 2023-08-08: 3 mL via RESPIRATORY_TRACT
  Filled 2023-08-08: qty 3

## 2023-08-08 MED ORDER — ONDANSETRON HCL 4 MG/2ML IJ SOLN
4.0000 mg | Freq: Four times a day (QID) | INTRAMUSCULAR | Status: DC | PRN
  Administered 2023-08-16: 4 mg via INTRAVENOUS
  Filled 2023-08-08: qty 2

## 2023-08-08 MED ORDER — ENOXAPARIN SODIUM 40 MG/0.4ML IJ SOSY: 40.0000 mg | PREFILLED_SYRINGE | Freq: Two times a day (BID) | INTRAMUSCULAR | Status: DC

## 2023-08-08 MED ORDER — RIVAROXABAN 15 MG PO TABS
15.0000 mg | ORAL_TABLET | Freq: Every day | ORAL | Status: DC
  Administered 2023-08-08 – 2023-08-22 (×15): 15 mg via ORAL
  Filled 2023-08-08 (×16): qty 1

## 2023-08-08 MED ORDER — FUROSEMIDE 10 MG/ML IJ SOLN: 40.0000 mg | Freq: Once | INTRAMUSCULAR | Status: DC

## 2023-08-08 NOTE — ED Provider Notes (Signed)
 [facility] Provider Note   None    (approximate) History  Respiratory Distress  HPI Dennis Savage is a 89 y.o. male with a past medical history of atrial fibrillation, CHF, emphysema, and recent stay in rehab who presents via EMS due to respiratory distress.  EMS noted patient to be 86% on room air with rales over bilateral lung fields.  This oxygenation improved on BiPAP to 96% throughout transport.  EMS noted patient to be normotensive with systolics in the 120s. ROS: Patient currently denies any vision changes, tinnitus, difficulty speaking, facial droop, sore throat, chest pain, abdominal pain, nausea/vomiting/diarrhea, dysuria, or weakness/numbness/paresthesias in any extremity   Physical Exam  Triage Vital Signs: ED Triage Vitals  Encounter Vitals Group     BP      Systolic BP Percentile      Diastolic BP Percentile      Pulse      Resp      Temp      Temp src      SpO2      Weight      Height      Head Circumference      Peak Flow      Pain Score      Pain Loc      Pain Education      Exclude from Growth Chart    Most recent vital signs: Vitals:   08/08/23 0901 08/08/23 0930  BP: (!) 140/76 (!) 111/58  Pulse: 79 65  Resp: 20 (!) 24  Temp: 99.2 F (37.3 C)   SpO2: 97% 95%   General: Awake, oriented x4. CV:  Good peripheral perfusion.  Resp:  Increased effort.  BiPAP in place Abd:  No distention.  Other:  Elderly overweight Caucasian male resting on stretcher with BiPAP in place in mild respiratory distress ED Results / Procedures / Treatments  Labs (all labs ordered are listed, but only abnormal results are displayed) Labs Reviewed  COMPREHENSIVE METABOLIC PANEL WITH GFR - Abnormal; Notable for the following components:      Result Value   Glucose, Bld 177 (*)    BUN 30 (*)    Creatinine, Ser 1.37 (*)    Total Protein 6.3 (*)    Total Bilirubin 1.4 (*)    GFR, Estimated 49 (*)    All other components within normal limits   BRAIN NATRIURETIC PEPTIDE - Abnormal; Notable for the following components:   B Natriuretic Peptide 1,457.8 (*)    All other components within normal limits  CBC WITH DIFFERENTIAL/PLATELET - Abnormal; Notable for the following components:   Hemoglobin 12.1 (*)    HCT 38.5 (*)    RDW 17.1 (*)    Monocytes Absolute 1.6 (*)    Abs Immature Granulocytes 0.10 (*)    All other components within normal limits  BLOOD GAS, VENOUS - Abnormal; Notable for the following components:   pO2, Ven <31 (*)    All other components within normal limits  TROPONIN I (HIGH SENSITIVITY) - Abnormal; Notable for the following components:   Troponin I (High Sensitivity) 395 (*)    All other components within normal limits  RESP PANEL BY RT-PCR (RSV, FLU A&B, COVID)  [id number]  TROPONIN I (HIGH SENSITIVITY)   EKG ED ECG REPORT I, Charleen Conn, the attending physician, personally viewed and interpreted this ECG. Date: 08/08/2023 EKG Time: 0902 Rate: 90 Rhythm: Atrial fibrillation QRS Axis: normal Intervals: PVCs ST/T Wave abnormalities: normal Narrative Interpretation:  Atrial fibrillation with multiple PVCs.  No evidence of acute ischemia RADIOLOGY ED MD interpretation: One-view portable chest x-ray shows multifocal infiltrates with small effusions bilaterally.  Imaging independently interpreted -Agree with radiology assessment Official radiology report(s): DG Chest Port 1 View Result Date: 08/08/2023 CLINICAL DATA:  Shortness of breath EXAM: PORTABLE CHEST 1 VIEW COMPARISON:  04/28/2023 FINDINGS: Cardiac shadow is enlarged but stable. Aortic calcifications are again seen. Patchy airspace opacity is noted in the upper lobes bilaterally. Stable calcified granuloma is noted in the left mid lung. Small effusions are seen bilaterally. IMPRESSION: Multifocal infiltrate with small effusions bilaterally. Electronically Signed   By: Violeta Grey M.D.   On: 08/08/2023 09:49   PROCEDURES: Critical Care performed:  Yes, see critical care procedure note(s) .1-3 Lead EKG Interpretation  Performed by: Charleen Conn, MD Authorized by: Charleen Conn, MD     Interpretation: normal     ECG rate:  71   ECG rate assessment: normal     Rhythm: sinus rhythm     Ectopy: none     Conduction: normal    MEDICATIONS ORDERED IN ED: Medications  ipratropium-albuterol  (DUONEB) 0.5-2.5 (3) MG/3ML nebulizer solution 3 mL (3 mLs Nebulization Given 08/08/23 0921)  furosemide  (LASIX ) injection 40 mg (40 mg Intravenous Given 08/08/23 1026)   IMPRESSION / MDM / ASSESSMENT AND PLAN / ED COURSE  I reviewed the triage vital signs and the nursing notes.                             The patient is on the cardiac monitor to evaluate for evidence of arrhythmia and/or significant heart rate changes. Patient's presentation is most consistent with acute presentation with potential threat to life or bodily function. Endorses dyspnea, endorses LE edema Denies Non adherence to medication regimen  Workup: ECG, CBC, BMP, Troponin, BNP, CXR Findings: EKG: No STEMI and no evidence of Brugada's sign, delta wave, epsilon wave, significantly prolonged QTc, or malignant arrhythmia. BNP: 1457 CXR: Multifocal infiltrates and bilateral pleural effusions Based on history, exam and findings, presentation most consistent with acute on chronic heart failure. Low suspicion for PNA, ACS, tamponade, aortic dissection. Interventions: Oxygen, Diuresis  Reassessment: Symptoms improved in ED with oxygen and diuresis  Disposition (Stable but not significantly improved): Admit to medicine for further monitoring and for improvement of medication regimen to control symptoms.   FINAL CLINICAL IMPRESSION(S) / ED DIAGNOSES   Final diagnoses:  Acute respiratory failure with hypoxia (HCC)  Acute pulmonary edema (HCC)   Rx / DC Orders   ED Discharge Orders     None      Note:  This document was prepared using Dragon voice recognition software  and may include unintentional dictation errors.   Dametria Tuzzolino K, MD 08/08/23 [phone]

## 2023-08-08 NOTE — Assessment & Plan Note (Signed)
BP stable  Titrate home regimen w/ diuresis 

## 2023-08-08 NOTE — ED Notes (Signed)
 Patient with expiratory wheeze, and with O2 Saturation from 86-91. Respiratory called to bedside to place pt back on bipap.

## 2023-08-08 NOTE — ED Triage Notes (Signed)
 Patient comes in from home via [facility] in respiratory distress. Pt with an initial O2 of 86% with EMS. Pt placed on Bipap. Pt with a history of COPD, CHF, and emphysema. MD Bradler and respiratory at bedside. Pt place on ER Bipap on arrival.

## 2023-08-08 NOTE — Assessment & Plan Note (Addendum)
 Acute on chronic HFpEF Decompensated respiratory status requiring BiPAP in the setting of volume overload and acute on chronic HFpEF 2D ECHO 06/2023 in the [facility] system with EF 55-60%, severe biatrial enlargement  BNP 1450 Noted pleural effusions on chest x-ray Noted baseline COPD- does not appear to be in exacerbation at present  IV Lasix  Recurrent issue per the family Prn duonebs Will consult cardiology Monitor

## 2023-08-08 NOTE — ED Notes (Signed)
 Patient sitting up in bed feeding self dinner.

## 2023-08-08 NOTE — ED Notes (Signed)
 Patient placed on cardiac monitoring with CCMD

## 2023-08-08 NOTE — ED Notes (Signed)
 This RN spoke with MD Daisey Dryer, whom advised this RN not to give patient the 1215 dose of ordered Lasix . MD Daisey Dryer to order dose for later time in day.

## 2023-08-08 NOTE — Consult Note (Addendum)
 [facility] CLINIC CARDIOLOGY CONSULT NOTE       Patient ID: Dennis Savage MRN: [id number] DOB/AGE: 88-Jun-1931 88 y.o.  Admit date: 08/08/2023 Referring Physician Dr. Pablo Boards Primary Physician Little Riff, MD  Primary Cardiologist Bary Likes (last seen in 2023) Reason for Consultation AoC HFpEF  HPI: Dennis Savage is a 89 y.o. male  with a past medical history of chronic HFpEF, chronic atrial fibrillation on Xarelto , COPD who presented to the ED on 08/08/2023 for worsening SOB and LE edema. Cardiology was consulted for further evaluation.   Majority of history obtained via chart review as patient is on BiPAP and is minimally interactive.  He was brought to the ED by EMS from his home earlier today due to concern for respiratory distress.  Initially was hypoxic and started on BiPAP en route to the ED.  Workup in the ED notable for creatinine 1.37, potassium 3 point, sodium 139, hemoglobin 12.1, WBC 10.1.  BNP elevated at 1457.  Troponin trended 395 > 483.  Blood gas in the ED with pH 7.3, pCO2 56, pO2 less than 31.  Respiratory viral panel negative.  Chest x-ray with multifocal infiltrate and small bilateral effusions.  He was started on IV Lasix  in the ED.  At the time my evaluation this afternoon he is resting comfortably in ED stretcher on BiPAP.  No family present at the time of my evaluation.  When asked if his shortness of breath has improved at all he shakes his head yes.  When asked if he is in any pain anywhere or specifically in his chest he shakes his head no.  He does have a significant amount of lower extremity edema.  BP and heart rate remained relatively stable.  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   Dizziness 02/25/2015   GERD (gastroesophageal reflux disease)    Heart murmur    Hypertension    Interstitial lung disease (HCC) 10/13/2013   LBP (low back pain) 11/03/2010   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   Peripheral vascular disease (HCC) 11/03/2010   Retinal hemorrhage 03/16/2014    Past Surgical History:  Procedure Laterality Date   APPENDECTOMY     BACK SURGERY     CARPAL TUNNEL RELEASE Bilateral    EYE SURGERY Bilateral    Cataract Extraction with IOL   HERNIA REPAIR     Umbilical Hernia X 3   JOINT REPLACEMENT     bilat knees   NASAL SINUS SURGERY     REPLACEMENT TOTAL KNEE Bilateral    SHOULDER SURGERY Right    TONSILLECTOMY     TOTAL HIP ARTHROPLASTY Right 10/12/2015   Procedure: TOTAL HIP ARTHROPLASTY;  Surgeon: Arlyne Lame, MD;  Location: [facility] ORS;  Service: Orthopedics;  Laterality: Right;   TOTAL HIP ARTHROPLASTY Left 02/15/2023   Procedure: TOTAL HIP ARTHROPLASTY;  Surgeon: Arlyne Lame, MD;  Location: [facility] ORS;  Service: Orthopedics;  Laterality: Left;    (Not in  a hospital admission)  Social History   Socioeconomic History   Marital status: Single    Spouse name: Not on file   Number of children: Not on file   Years of education: Not on file   Highest education level: Not on file  Occupational History   Not on file  Tobacco Use   Smoking status: Former    Current packs/day: 1.00    Types: Cigarettes   Smokeless tobacco: Never  Vaping Use   Vaping status: Never Used  Substance and Sexual Activity   Alcohol use: No   Drug use: No   Sexual activity: Not on file  Other Topics Concern   Not on file  Social History Narrative   Not on file   Social Drivers of Health    Financial Resource Strain: Low Risk  (06/07/2023)   Received from [facility]   Overall Financial Resource Strain (CARDIA)    Difficulty of Paying Living Expenses: Not hard at all  Food Insecurity: No Food Insecurity (06/07/2023)   Received from [facility]   Hunger Vital Sign    Worried About Running Out of Food in the Last Year: Never true    Ran Out of Food in the Last Year: Never true  Transportation Needs: No Transportation Needs (06/07/2023)   Received from [facility]   PRAPARE - Transportation    Lack of Transportation (Medical): No    Lack of Transportation (Non-Medical): No  Physical Activity: Not on file  Stress: Not on file  Social Connections: Moderately Isolated (04/16/2023)   Social Connection and Isolation Panel [NHANES]    Frequency of Communication with Friends and Family: More than three times a week    Frequency of Social Gatherings with Friends and Family: More than three times a week    Attends Religious Services: More than 4 times per year    Active Member of [employer] or Organizations: No    Attends Banker Meetings: Never    Marital Status: Divorced  Catering manager Violence: Not At Risk (04/16/2023)   Humiliation, Afraid, Rape, and Kick questionnaire    Fear of Current or Ex-Partner: No    Emotionally Abused: No    Physically Abused: No    Sexually Abused: No    Family History  Problem Relation Age of Onset   Bladder Cancer Neg Hx    Kidney cancer Neg Hx    Prostate cancer Neg Hx      Vitals:   08/08/23 0859 08/08/23 0901 08/08/23 0902 08/08/23 0930  BP:  (!) 140/76  (!) 111/58  Pulse:  79  65  Resp:  20  (!) 24  Temp:  99.2 F (37.3 C)    TempSrc:  Axillary    SpO2: (!) 86% 97%  95%  Weight:   91.7 kg   Height:   5\' 9"  (1.753 m)     PHYSICAL EXAM General: Ill-appearing elderly male, well nourished, in no acute distress. HEENT: Normocephalic and atraumatic. Neck: No JVD.  Lungs: Normal respiratory effort on BiPAP.   Diminished bilaterally Heart: Irregularly irregular, borderline slow rate. Normal S1 and S2 without gallops or murmurs.  Abdomen: Non-distended appearing.  Msk: Normal strength and tone for age. Extremities: Warm and well perfused. No clubbing, cyanosis.  4+ pitting edema bilaterally.  Neuro: Alert and oriented X 3. Psych: Answers questions appropriately.   Labs: Basic Metabolic Panel: Recent Labs    08/08/23 0907  NA 139  K 3.8  CL  106  CO2 25  GLUCOSE 177*  BUN 30*  CREATININE 1.37*  CALCIUM  9.1   Liver Function Tests: Recent Labs    08/08/23 0907  AST 24  ALT 20  ALKPHOS 55  BILITOT 1.4*  PROT 6.3*  ALBUMIN 3.5   No results for input(s): "LIPASE", "AMYLASE" in the last 72 hours. CBC: Recent Labs    08/08/23 0907  WBC 10.1  NEUTROABS 6.8  HGB 12.1*  HCT 38.5*  MCV 90.2  PLT 157   Cardiac Enzymes: Recent Labs    08/08/23 0907 08/08/23 1101  TROPONINIHS 395* 483*   BNP: Recent Labs    08/08/23 0907  BNP 1,457.8*   D-Dimer: No results for input(s): "DDIMER" in the last 72 hours. Hemoglobin A1C: No results for input(s): "HGBA1C" in the last 72 hours. Fasting Lipid Panel: No results for input(s): "CHOL", "HDL", "LDLCALC", "TRIG", "CHOLHDL", "LDLDIRECT" in the last 72 hours. Thyroid Function Tests: No results for input(s): "TSH", "T4TOTAL", "T3FREE", "THYROIDAB" in the last 72 hours.  Invalid input(s): "FREET3" Anemia Panel: No results for input(s): "VITAMINB12", "FOLATE", "FERRITIN", "TIBC", "IRON", "RETICCTPCT" in the last 72 hours.   Radiology: [facility] Chest Port 1 View Result Date: 08/08/2023 CLINICAL DATA:  Shortness of breath EXAM: PORTABLE CHEST 1 VIEW COMPARISON:  04/28/2023 FINDINGS: Cardiac shadow is enlarged but stable. Aortic calcifications are again seen. Patchy airspace opacity is noted in the upper lobes bilaterally. Stable calcified granuloma is noted in the left mid lung. Small effusions are seen bilaterally. IMPRESSION: Multifocal  infiltrate with small effusions bilaterally. Electronically Signed   By: Violeta Grey M.D.   On: 08/08/2023 09:49    ECHO ordered  TELEMETRY reviewed by me 08/08/2023: Atrial fibrillation PVCs rate 50s  EKG reviewed by me: atrial fibrillation PVCs, baseline artifact, rate 90 bpm  Data reviewed by me 08/08/2023: last 24h vitals tele labs imaging I/O ED provider note, admission H&P  Principal Problem:   Acute on chronic respiratory failure with hypoxia (HCC) Active Problems:   Acute respiratory failure with hypoxia (HCC)    ASSESSMENT AND PLAN:  Dennis Savage is a 89 y.o. male  with a past medical history of chronic HFpEF, chronic atrial fibrillation on Xarelto , COPD who presented to the ED on 08/08/2023 for worsening SOB and LE edema. Cardiology was consulted for further evaluation.   # Acute hypoxic respiratory failure # Acute on chronic HFpEF # Chronic atrial fibrillation # Frequent PVCs Patient brought to the ED by EMS due to respiratory distress.  Per chart review family reported the patient had had worsening shortness of breath and lower extremity edema prior to coming to the ED.  BNP elevated at 1400.  Troponins 395 > 483.  Placed on BiPAP by EMS.  Started on IV Lasix  in the ED. -Echo ordered. -Continue IV Lasix  40 mg twice daily.  Patient reportedly has not been taking p.o. Lasix  at home. -Strict intake/output, monitor renal function and electrolytes closely with diuresis. - Resume Xarelto  20 mg daily, DC Lovenox .  Patient reportedly had not been taking this at home. -Mildly elevated troponin most consistent with demand/supply mismatch and not ACS.  No plan for further cardiac diagnostics at this time. -Continue BiPAP, wean to nasal cannula as able. -Palliative has been consulted by primary team.   This patient's plan of care was discussed and created with Dr. Beau Bound and he is in agreement.  Signed: Hamp Levine, PA-C  08/08/2023, 12:24 PM [facility]  Cardiology

## 2023-08-08 NOTE — Assessment & Plan Note (Signed)
 Cr 1.37 w/ GFR in the 40s  Appears to be near baseline  Monitor w/ diuresis  Follow

## 2023-08-08 NOTE — Assessment & Plan Note (Signed)
 BP stable  Monitor w/ diuresis

## 2023-08-08 NOTE — Assessment & Plan Note (Signed)
 Troponin 400 presentation No active chest pain Suspect demand ischemia in setting of decompensated respiratory failure and volume overload on BiPAP Continue home Xarelto  for now Follow-up cardiology recommendations

## 2023-08-08 NOTE — Assessment & Plan Note (Signed)
 PPI ?

## 2023-08-08 NOTE — H&P (Addendum)
 History and Physical    Patient: Dennis Savage:[id number] DOB: [birth year] DOA: 08/08/2023 DOS: the patient was seen and examined on 08/08/2023 PCP: Little Riff, MD  Patient coming from: Home  Chief Complaint:  Chief Complaint  Patient presents with   Respiratory Distress   HPI: Dennis Savage is a 89 y.o. male with medical history significant of paroxysmal atrial fibrillation on Xarelto , HFpEF, COPD, prostate cancer (on palliative therapy), CKD III, HTN, GERD, and subdural hygroma presenting with acute respiratory failure with hypoxia on BiPAP, acute on chronic HFpEF, volume overload.  History primarily from family.  Per report, patient with increased work of breathing with past 2 to 3 days.  Noted to have been recently admitted March 2025 in the [facility] system for similar issues.  Family reports worsening shortness of breath at home.  Has been using low-dose Lasix  intermittently.  Still with worsening lower extremity swelling and rales in the lungs.  Family reports that this is a recurring issue.  Has very narrow volume window.  No reported fevers or chills.  No reported wheezing or cough.  No reported chest pain or abdominal pain.  No reported high salt intake or NSAID use. Tmax 99.2, hemodynamically stable.  Initially satting 86% on room air.  Transition to BiPAP.  White count 10.1, hemoglobin 12.1, platelets 157, troponin 3 95-->430.CXR w/ bilateral pleural effusions.  Review of Systems: As mentioned in the history of present illness. All other systems reviewed and are negative. 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   Dizziness 02/25/2015   GERD (gastroesophageal reflux disease)    Heart murmur    Hypertension    Interstitial lung disease (HCC) 10/13/2013   LBP (low back pain) 11/03/2010   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   Peripheral vascular disease (HCC) 11/03/2010   Retinal hemorrhage 03/16/2014   Past Surgical History:  Procedure Laterality Date   APPENDECTOMY     BACK SURGERY     CARPAL TUNNEL RELEASE Bilateral    EYE SURGERY Bilateral    Cataract Extraction with IOL   HERNIA REPAIR     Umbilical Hernia X 3   JOINT REPLACEMENT     bilat knees   NASAL SINUS SURGERY     REPLACEMENT TOTAL KNEE Bilateral    SHOULDER SURGERY Right    TONSILLECTOMY     TOTAL HIP ARTHROPLASTY Right 10/12/2015   Procedure: TOTAL HIP ARTHROPLASTY;  Surgeon: Arlyne Lame, MD;  Location: [facility] ORS;  Service: Orthopedics;  Laterality: Right;   TOTAL HIP ARTHROPLASTY Left 02/15/2023   Procedure: TOTAL HIP ARTHROPLASTY;  Surgeon: Arlyne Lame, MD;  Location: [facility] ORS;  Service: Orthopedics;  Laterality: Left;   Social History:  reports that he has quit smoking. His smoking use included cigarettes. He has never used smokeless tobacco. He reports that he does not drink alcohol and does not use drugs.  Allergies  Allergen Reactions   Lisinopril Cough   Penicillins Hives and Swelling    IgE = 17 ((WNL) on 02/08/2023    Family History  Problem Relation Age of Onset   Bladder Cancer Neg Hx    Kidney cancer Neg Hx    Prostate cancer Neg Hx     Prior to Admission medications   Medication Sig Start Date End Date Taking? Authorizing Provider  acetaminophen  (TYLENOL ) 325 MG tablet Take 650 mg by mouth every 6 (six) hours as needed.   Yes [provider]  albuterol  (VENTOLIN  HFA) 108 (90 Base) MCG/ACT inhaler Inhale 2 puffs into the lungs every 6 (six) hours as needed for wheezing or shortness of breath.    Yes [provider]  amLODipine  (NORVASC ) 10 MG tablet Take 10 mg by mouth daily.   Yes [provider]  Calcium  Carb-Cholecalciferol  (CALCIUM  600 + D PO) Take 1 tablet by mouth daily.   Yes [provider]  cholecalciferol  (VITAMIN D3) 25 MCG (1000 UNIT) tablet Take 1,000 Units by mouth daily.   Yes [provider]  docusate sodium  (COLACE) 100 MG capsule Take 100 mg by mouth 2 (two) times daily.   Yes [provider]  fluticasone  (FLONASE ) 50 MCG/ACT nasal spray Place 1 spray into both nostrils 2 (two) times daily. 03/06/23  Yes [provider]  guaiFENesin  (MUCINEX ) 600 MG 12 hr tablet Take 600 mg by mouth 2 (two) times daily. 04/14/23 04/19/24 Yes [provider]  hydrOXYzine (ATARAX) 10 MG tablet Take 10 mg by mouth at bedtime as needed for itching. 05/22/23  Yes [provider]  Lactulose  20 GM/30ML SOLN Take 30 mLs (20 g total) by mouth daily as needed. 04/29/23  Yes Donaciano Frizzle, MD  Multiple Vitamins-Minerals (PRESERVISION AREDS 2 PO) Take 1 capsule by mouth 2 (two) times daily.   Yes [provider]  Omega-3 Fatty Acids (FISH OIL) 300 MG CAPS Take 300 mg by mouth daily.   Yes [provider]  omeprazole (PRILOSEC) 20 MG capsule Take 20 mg by mouth daily. 05/05/22  Yes [provider]  polyethylene glycol (MIRALAX  / GLYCOLAX ) 17 g packet Take 17 g by mouth daily. 04/30/23  Yes Donaciano Frizzle, MD  potassium chloride  (KLOR-CON ) 10 MEQ tablet Take by mouth. 05/02/23  Yes [provider]  relugolix  (ORGOVYX ) 120 MG tablet Take 1 tablet (120 mg total) by mouth daily. 02/20/23  Yes Dustin Gimenez, MD  Fluticasone -Umeclidin-Vilant (TRELEGY ELLIPTA) 100-62.5-25 MCG/ACT AEPB Inhale 1 puff into the lungs daily. Patient not taking: Reported on 08/08/2023 03/29/23   [provider]  furosemide  (LASIX ) 20 MG tablet Take 1 tablet (20 mg total) by mouth daily. Patient not taking: Reported on  08/08/2023 04/29/23 04/28/24  Donaciano Frizzle, MD  linaclotide  (LINZESS ) 145 MCG CAPS capsule Take 1 capsule (145 mcg total) by mouth daily before breakfast. Patient not taking: Reported on 08/08/2023 04/30/23   Donaciano Frizzle, MD  rivaroxaban  (XARELTO ) 20 MG TABS tablet Take 20 mg by mouth daily with supper. Patient not taking: Reported on 08/08/2023    [provider]  traMADol  (ULTRAM ) 50 MG tablet Take 50 mg by mouth 2 (two) times daily as needed for moderate pain (pain score 4-6) or severe pain (pain score 7-10). Patient not taking: Reported on 08/08/2023 02/16/23   [provider]    Physical Exam: Vitals:   08/08/23 0859 08/08/23 0901 08/08/23 0902 08/08/23 0930  BP:  (!) 140/76  (!) 111/58  Pulse:  79  65  Resp:  20  (!) 24  Temp:  99.2 F (37.3 C)    TempSrc:  Axillary    SpO2: (!) 86%  97%  95%  Weight:   91.7 kg   Height:   5\' 9"  (1.753 m)    Physical Exam Constitutional:      Appearance: He is obese.  HENT:     Head: Normocephalic and atraumatic.     Nose: Nose normal.     Mouth/Throat:     Mouth: Mucous membranes are moist.  Eyes:     Pupils: Pupils are equal, round, and reactive to light.  Cardiovascular:     Rate and Rhythm: Normal rate and regular rhythm.  Pulmonary:     Effort: Pulmonary effort is normal.     Breath sounds: Rales present.  Abdominal:     General: Bowel sounds are normal.  Musculoskeletal:     Right lower leg: Edema present.     Left lower leg: Edema present.  Neurological:     General: No focal deficit present.  Psychiatric:        Mood and Affect: Mood normal.     Data Reviewed:  There are no new results to review at this time.  DG Chest Port 1 View CLINICAL DATA:  Shortness of breath  EXAM: PORTABLE CHEST 1 VIEW  COMPARISON:  04/28/2023  FINDINGS: Cardiac shadow is enlarged but stable. Aortic calcifications are again seen. Patchy airspace opacity is noted in the upper lobes bilaterally. Stable calcified granuloma  is noted in the left mid lung. Small effusions are seen bilaterally.  IMPRESSION: Multifocal infiltrate with small effusions bilaterally.  Electronically Signed   By: Violeta Grey M.D.   On: 08/08/2023 09:49  Lab Results  Component Value Date   WBC 10.1 08/08/2023   HGB 12.1 (L) 08/08/2023   HCT 38.5 (L) 08/08/2023   MCV 90.2 08/08/2023   PLT 157 08/08/2023   Last metabolic panel Lab Results  Component Value Date   GLUCOSE 177 (H) 08/08/2023   NA 139 08/08/2023   K 3.8 08/08/2023   CL 106 08/08/2023   CO2 25 08/08/2023   BUN 30 (H) 08/08/2023   CREATININE 1.37 (H) 08/08/2023   GFRNONAA 49 (L) 08/08/2023   CALCIUM  9.1 08/08/2023   PROT 6.3 (L) 08/08/2023   ALBUMIN 3.5 08/08/2023   BILITOT 1.4 (H) 08/08/2023   ALKPHOS 55 08/08/2023   AST 24 08/08/2023   ALT 20 08/08/2023   ANIONGAP 8 08/08/2023    Assessment and Plan: Acute respiratory failure with hypoxia (HCC) Acute on chronic HFpEF Decompensated respiratory status requiring BiPAP in the setting of volume overload and acute on chronic HFpEF 2D ECHO 06/2023 in the [facility] system with EF 55-60%, severe biatrial enlargement  BNP 1450 Noted pleural effusions on chest x-ray Noted baseline COPD- does not appear to be in exacerbation at present  IV Lasix  Recurrent issue per the family Prn duonebs Will consult cardiology Monitor  NSTEMI (non-ST elevated myocardial infarction) (HCC) Troponin 400 presentation No active chest pain Suspect demand ischemia in setting of decompensated respiratory failure and volume overload on BiPAP Continue home Xarelto  for now Follow-up cardiology recommendations  COPD (chronic obstructive pulmonary disease) (HCC) Noted baseline COPD Concurrent decompensated respiratory failure requiring BiPAP Does not appear to be in acute exacerbation at present Continue home regiment Otherwise monitor  Essential hypertension BP stable  Titrate home regimen w/ diuresis   Paroxysmal atrial  fibrillation (HCC) Rate controlled at present  Continue Xarelto   GERD without esophagitis PPI   CKD stage 3a, GFR 45-59 ml/min (HCC) Cr 1.37 w/ GFR in the 40s  Appears to be near baseline  Monitor w/ diuresis  Follow   Benign hypertension BP stable Monitor w/ diuresis      Greater than 50% was spent in counseling and coordination of care with patient Critical care time: 60+ minutes   Advance Care Planning:   Code Status: Full Code   Consults: Cardiology   Family Communication: Daughters at the bedside   Severity of Illness: The appropriate patient status for this patient is OBSERVATION. Observation status is judged to be reasonable and necessary in order to provide the required intensity of service to ensure the patient's safety. The patient's presenting symptoms, physical exam findings, and initial radiographic and laboratory data in the context of their medical condition is felt to place them at decreased risk for further clinical deterioration. Furthermore, it is anticipated that the patient will be medically stable for discharge from the hospital within 2 midnights of admission.   Author: Corrinne Din, MD 08/08/2023 12:43 PM  For on call review [email or web address].

## 2023-08-08 NOTE — Assessment & Plan Note (Signed)
 Noted baseline COPD Concurrent decompensated respiratory failure requiring BiPAP Does not appear to be in acute exacerbation at present Continue home regiment Otherwise monitor

## 2023-08-08 NOTE — Assessment & Plan Note (Addendum)
 Rate controlled at present  Continue Xarelto 

## 2023-08-08 NOTE — Progress Notes (Signed)
 Heart Failure Navigator Progress Note  Assessed for Heart & Vascular TOC clinic readiness.  Does not meet criteria due to current [facility] patient.   Navigator will sign off at this time.  Roxy Horseman, RN, BSN [facility] Heart Failure Navigator Secure Chat Only

## 2023-08-08 NOTE — Consult Note (Addendum)
 Consultation Note Date: 08/08/2023 at 1530  Patient Name: Dennis Savage  DOB: Mar 30, 1930  MRN: [id number]  Age / Sex: 89 y.o., male  PCP: Little Riff, MD Referring Physician: Corrinne Din, MD  HPI/Patient Profile: 89 y.o. male  with past medical history of proximal A-fib (Xarelto ), HFpEF, COPD, prostate cancer, CKD (stage III), HTN, GERD, and subdural hygroma admitted on 08/08/2023 with shortness of breath.  Patient had recent admission at [facility] for the same (March 2025).  Patient is being treated for acute on chronic HFrEF, acute respiratory failure with volume overload, NSTEMI, and COPD.  PMT was consulted to support patient and family with goals of care discussions.  Clinical Assessment and Goals of Care: Extensive chart review completed prior to meeting patient including labs, vital signs, imaging, progress notes, orders, and available advanced directive documents from current and previous encounters. I then met with patient at bedside to discuss diagnosis, prognosis, GOC, EOL wishes, disposition and options.  I introduced Palliative Medicine as specialized medical care for people living with serious illness. It focuses on providing relief from the symptoms and stress of a serious illness. The goal is to improve quality of life for both the patient and the family.  We discussed a brief life review of the patient.  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  He is a widow and has 2 daughters.  He lives with his daughter Dennis Savage and her family.  As far as functional and nutritional status patient endorses he was using a walker for stability PTA.  He endorses a healthy appetite and no issues with intake.  We discussed patient's current illness-HFrEF and volume overload-and what it means in the larger  context of patient's on-going co-morbidities and advanced age.  Reviewed plan to continue with IV Lasix  and follow cardiology's recommendations.  He shares he continues to be admitted to the hospital for fluid overload.  He shares that his legs swell and he does not understand why.  Extensive education provided on heart failure and volume overload.  I attempted to elicit values and goals of care important to the patient.  Patient states he does not want to be a burden on anyone.  However, he has plans to live  to 89 years old.  He says he does not think about dying but rather focuses on living.  Advance directive and planning considered and discussed.  Patient shares he has never thought about advanced care planning or medical decision making for himself.  He has never created a living will or outlined his medical wishes. However, he endorses that it is an important discussion to have.  He shares he would like his daughter to be involved in our discussions.  Concepts such as code status and boundaries of medical care reviewed.  In the event of a cardiopulmonary arrest, patient states that he would never be accepting of CPR or a ventilator.  He shares  that he does not believe a ventilator or life support would give him any better quality of life.  He shares that when his time comes he believes he should just be able to pass peacefully. However, he would like to have this discussion with his daughter prior to make any changes to his plan of care.   With patient's permission, and after our visit had completed, I spoke with patient's daughter Dennis Savage over the phone.  We agreed to meet in person with patient present at bedside tomorrow after 10 AM.    Ongoing discussions on patient's goals and boundaries of care to continue with patient and daughter bedside tomorrow.  No adjustment to plan of care at this time.  Primary Decision Maker PATIENT  Physical Exam Vitals reviewed.  Constitutional:       General: He is not in acute distress. HENT:     Head: Normocephalic.     Ears:     Comments: Pt keeps right eyelid closed but is able to open it on command    Nose: Nose normal.  Eyes:     Pupils: Pupils are equal, round, and reactive to light.  Cardiovascular:     Rate and Rhythm: Normal rate. Rhythm irregular.     Pulses: Normal pulses.  Pulmonary:     Effort: Pulmonary effort is normal.  Abdominal:     Palpations: Abdomen is soft.  Musculoskeletal:     Right lower leg: Edema present.     Left lower leg: Edema present.     Comments: Generalized weakness  Skin:    General: Skin is warm and dry.  Neurological:     Mental Status: He is alert and oriented to person, place, and time.  Psychiatric:        Mood and Affect: Mood normal.        Behavior: Behavior normal.        Judgment: Judgment normal.     Palliative Assessment/Data: 40%     Thank you for this consult. Palliative medicine will continue to follow and assist holistically.   Time Total: 75 minutes  Time spent includes: Detailed review of medical records (labs, imaging, vital signs), medically appropriate exam (mental status, respiratory, cardiac, skin), discussed with treatment team, counseling and educating patient, family and staff, documenting clinical information, medication management and coordination of care.  Signed by: Eller Gut, DNP, FNP-BC Palliative Medicine   Please contact Palliative Medicine Team providers via [facility] for questions and concerns.

## 2023-08-09 DIAGNOSIS — J9601 Acute respiratory failure with hypoxia: Secondary | ICD-10-CM | POA: Diagnosis not present

## 2023-08-09 DIAGNOSIS — L8990 Pressure ulcer of unspecified site, unspecified stage: Secondary | ICD-10-CM | POA: Diagnosis present

## 2023-08-09 DIAGNOSIS — Z515 Encounter for palliative care: Secondary | ICD-10-CM | POA: Diagnosis not present

## 2023-08-09 DIAGNOSIS — I5033 Acute on chronic diastolic (congestive) heart failure: Secondary | ICD-10-CM | POA: Diagnosis not present

## 2023-08-09 DIAGNOSIS — J810 Acute pulmonary edema: Secondary | ICD-10-CM | POA: Diagnosis not present

## 2023-08-09 LAB — COMPREHENSIVE METABOLIC PANEL WITH GFR
ALT: 15 U/L (ref 0–44)
AST: 22 U/L (ref 15–41)
Albumin: 2.9 g/dL — ABNORMAL LOW (ref 3.5–5.0)
Alkaline Phosphatase: 48 U/L (ref 38–126)
Anion gap: 7 (ref 5–15)
BUN: 29 mg/dL — ABNORMAL HIGH (ref 8–23)
CO2: 25 mmol/L (ref 22–32)
Calcium: 8.8 mg/dL — ABNORMAL LOW (ref 8.9–10.3)
Chloride: 107 mmol/L (ref 98–111)
Creatinine, Ser: 1.37 mg/dL — ABNORMAL HIGH (ref 0.61–1.24)
GFR, Estimated: 49 mL/min — ABNORMAL LOW (ref >60)
Glucose, Bld: 98 mg/dL (ref 70–99)
Potassium: 3.8 mmol/L (ref 3.5–5.1)
Sodium: 139 mmol/L (ref 135–145)
Total Bilirubin: 1.5 mg/dL — ABNORMAL HIGH (ref 0.0–1.2)
Total Protein: 5.7 g/dL — ABNORMAL LOW (ref 6.5–8.1)

## 2023-08-09 LAB — CBC
HCT: 35.7 % — ABNORMAL LOW (ref 39.0–52.0)
Hemoglobin: 11.2 g/dL — ABNORMAL LOW (ref 13.0–17.0)
MCH: 28 pg (ref 26.0–34.0)
MCHC: 31.4 g/dL (ref 30.0–36.0)
MCV: 89.3 fL (ref 80.0–100.0)
Platelets: 155 10*3/uL (ref 150–400)
RBC: 4 MIL/uL — ABNORMAL LOW (ref 4.22–5.81)
RDW: 16.9 % — ABNORMAL HIGH (ref 11.5–15.5)
WBC: 8.2 10*3/uL (ref 4.0–10.5)
nRBC: 0 % (ref 0.0–0.2)

## 2023-08-09 MED ORDER — ALBUTEROL SULFATE (2.5 MG/3ML) 0.083% IN NEBU: 2.5000 mg | INHALATION_SOLUTION | RESPIRATORY_TRACT | Status: DC | PRN

## 2023-08-09 MED ORDER — PROSIGHT PO TABS
1.0000 | ORAL_TABLET | Freq: Two times a day (BID) | ORAL | Status: DC
  Administered 2023-08-09 – 2023-08-23 (×28): 1 via ORAL
  Filled 2023-08-09 (×31): qty 1

## 2023-08-09 MED ORDER — FLUTICASONE PROPIONATE 50 MCG/ACT NA SUSP
1.0000 | Freq: Two times a day (BID) | NASAL | Status: DC
  Administered 2023-08-10 – 2023-08-23 (×26): 1 via NASAL
  Filled 2023-08-09 (×2): qty 16

## 2023-08-09 MED ORDER — HYDROXYZINE HCL 10 MG PO TABS
10.0000 mg | ORAL_TABLET | Freq: Every evening | ORAL | Status: DC | PRN
  Administered 2023-08-16: 10 mg via ORAL
  Filled 2023-08-09 (×2): qty 1

## 2023-08-09 MED ORDER — GUAIFENESIN ER 600 MG PO TB12
600.0000 mg | ORAL_TABLET | Freq: Two times a day (BID) | ORAL | Status: DC
  Administered 2023-08-09 – 2023-08-23 (×28): 600 mg via ORAL
  Filled 2023-08-09 (×29): qty 1

## 2023-08-09 MED ORDER — FUROSEMIDE 10 MG/ML IJ SOLN
40.0000 mg | Freq: Two times a day (BID) | INTRAMUSCULAR | Status: DC
  Administered 2023-08-09 – 2023-08-14 (×10): 40 mg via INTRAVENOUS
  Filled 2023-08-09 (×10): qty 4

## 2023-08-09 MED ORDER — SPIRONOLACTONE 12.5 MG HALF TABLET
12.5000 mg | ORAL_TABLET | Freq: Every day | ORAL | Status: DC
  Administered 2023-08-09 – 2023-08-14 (×6): 12.5 mg via ORAL
  Filled 2023-08-09 (×7): qty 1

## 2023-08-09 MED ORDER — POTASSIUM CHLORIDE CRYS ER 10 MEQ PO TBCR
10.0000 meq | EXTENDED_RELEASE_TABLET | Freq: Every day | ORAL | Status: DC
  Administered 2023-08-09 – 2023-08-23 (×15): 10 meq via ORAL
  Filled 2023-08-09 (×16): qty 1

## 2023-08-09 MED ORDER — RELUGOLIX 120 MG PO TABS
120.0000 mg | ORAL_TABLET | Freq: Every day | ORAL | Status: DC
  Administered 2023-08-10 – 2023-08-22 (×13): 120 mg via ORAL
  Filled 2023-08-09 (×13): qty 1

## 2023-08-09 MED ORDER — DOCUSATE SODIUM 100 MG PO CAPS
100.0000 mg | ORAL_CAPSULE | Freq: Two times a day (BID) | ORAL | Status: DC
  Administered 2023-08-09 – 2023-08-23 (×26): 100 mg via ORAL
  Filled 2023-08-09 (×28): qty 1

## 2023-08-09 MED ORDER — PANTOPRAZOLE SODIUM 40 MG PO TBEC
40.0000 mg | DELAYED_RELEASE_TABLET | Freq: Every day | ORAL | Status: DC
  Administered 2023-08-09 – 2023-08-23 (×15): 40 mg via ORAL
  Filled 2023-08-09 (×15): qty 1

## 2023-08-09 MED ORDER — LACTULOSE 10 GM/15ML PO SOLN: 20.0000 g | Freq: Every day | ORAL | Status: DC | PRN

## 2023-08-09 MED ORDER — IPRATROPIUM-ALBUTEROL 0.5-2.5 (3) MG/3ML IN SOLN
3.0000 mL | Freq: Four times a day (QID) | RESPIRATORY_TRACT | Status: DC
  Administered 2023-08-09 – 2023-08-10 (×4): 3 mL via RESPIRATORY_TRACT
  Filled 2023-08-09 (×4): qty 3

## 2023-08-09 MED ORDER — POLYETHYLENE GLYCOL 3350 17 G PO PACK
17.0000 g | PACK | Freq: Every day | ORAL | Status: DC
  Administered 2023-08-10 – 2023-08-22 (×11): 17 g via ORAL
  Filled 2023-08-09 (×13): qty 1

## 2023-08-09 NOTE — Progress Notes (Addendum)
                                                     Palliative Care Progress Note, Assessment & Plan   Patient Name: Dennis Savage       Date: 08/09/2023 DOB: 02/12/1930  Age: 89 y.o. MRN#: [id number] Attending Physician: Lorita Rosa, MD Primary Care Physician: Little Riff, MD Admit Date: 08/08/2023  Subjective: Patient is asleep in no apparent distress.  Respirations are even and unlabored.  He easily awakens to my presence but quickly returns to sleep.  No family or friends present during my visit.  HPI: 89 y.o. male  with past medical history of proximal A-fib (Xarelto ), HFpEF, COPD, prostate cancer, CKD (stage III), HTN, GERD, and subdural hygroma admitted on 08/08/2023 with shortness of breath.  Patient had recent admission at [facility] for the same (March 2025).   Patient is being treated for acute on chronic HFrEF, acute respiratory failure with volume overload, NSTEMI, and COPD.   PMT was consulted to support patient and family with goals of care discussions.  Summary of counseling/coordination of care: Extensive chart review completed prior to meeting patient including labs, vital signs, imaging, progress notes, orders, and available advanced directive documents from current and previous encounters.   After reviewing the patient's chart and assessing the patient at bedside, I attempted to speak with patient's daughter.  We had planned to meet bedside this morning.  Patient's daughter was not bedside during the time of my visit.  Therefore, I attempted to speak with her over the phone.  No answer.  HIPAA compliant voicemail left with PMT contact info.  No adjustment to plan of care at this time.  PMT remains available patient and family throughout his hospitalization.  I am off service until next Tuesday.  I will ask a PMT colleague to follow-up  with patient and family over the weekend to continue goals of care discussions.  Physical Exam Vitals reviewed.  Constitutional:      Appearance: He is normal weight.  HENT:     Head: Normocephalic.     Mouth/Throat:     Mouth: Mucous membranes are moist.  Eyes:     Pupils: Pupils are equal, round, and reactive to light.  Cardiovascular:     Rate and Rhythm: Normal rate.  Pulmonary:     Effort: Pulmonary effort is normal.  Musculoskeletal:     Comments: Generalized weakness  Skin:    General: Skin is warm and dry.  Neurological:     Mental Status: He is alert.  Psychiatric:        Mood and Affect: Mood normal.        Behavior: Behavior normal.        Judgment: Judgment normal.             Total Time 25 minutes   Time spent includes: Detailed review of medical records (labs, imaging, vital signs), medically appropriate exam (mental status, respiratory, cardiac, skin), discussed with treatment team, counseling and educating patient, family and staff, documenting clinical information, medication management and coordination of care.  Judeen Nose L. Rebbeca Campi, DNP, FNP-BC Palliative Medicine Team

## 2023-08-09 NOTE — Progress Notes (Deleted)
 Heart Failure Navigator Progress Note  Assessed for Heart & Vascular TOC clinic readiness.  Patient does not meet criteria due to [facility] patient.  Navigator will sign off at this time.   Roxy Horseman, RN, BSN [facility] Heart Failure Navigator Secure Chat Only

## 2023-08-09 NOTE — ED Notes (Signed)
 Pt removed from BiPAP to trial as he is COPD and satting at 98% O2 while asleep. PRN breathing Tx given.

## 2023-08-09 NOTE — ED Notes (Signed)
 Pt given incentive spirometer with teach back, goal is 1x an hour.

## 2023-08-09 NOTE — Progress Notes (Signed)
 [facility] CLINIC CARDIOLOGY PROGRESS NOTE       Patient ID: Dennis Savage MRN: [id number] DOB/AGE: 20-Sep-1929 89 y.o.  Admit date: 08/08/2023 Referring Physician Dr. Pablo Boards Primary Physician Little Riff, MD  Primary Cardiologist Bary Likes (last seen in 2023) Reason for Consultation AoC HFpEF  HPI: Dennis Savage is a 89 y.o. male  with a past medical history of chronic HFpEF, chronic atrial fibrillation on Xarelto , COPD who presented to the ED on 08/08/2023 for worsening SOB and LE edema. Cardiology was consulted for further evaluation.   Interval history: -Patient seen and examined this AM, reports he is feeling better overall today.  -Reports SOB is improving, off BiPAP and now on supplemental O2.  -Cr stable, good UOP with diuresis.  -BP and HR remain stable.  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   Dizziness 02/25/2015   GERD (gastroesophageal reflux disease)    Heart murmur    Hypertension    Interstitial lung disease (HCC) 10/13/2013   LBP (low back pain) 11/03/2010   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   Peripheral vascular disease (HCC) 11/03/2010   Retinal hemorrhage 03/16/2014    Past Surgical History:  Procedure Laterality Date   APPENDECTOMY     BACK SURGERY     CARPAL TUNNEL RELEASE Bilateral    EYE SURGERY Bilateral    Cataract Extraction with IOL   HERNIA REPAIR     Umbilical Hernia X 3   JOINT REPLACEMENT      bilat knees   NASAL SINUS SURGERY     REPLACEMENT TOTAL KNEE Bilateral    SHOULDER SURGERY Right    TONSILLECTOMY     TOTAL HIP ARTHROPLASTY Right 10/12/2015   Procedure: TOTAL HIP ARTHROPLASTY;  Surgeon: Arlyne Lame, MD;  Location: [facility] ORS;  Service: Orthopedics;  Laterality: Right;   TOTAL HIP ARTHROPLASTY Left 02/15/2023   Procedure: TOTAL HIP ARTHROPLASTY;  Surgeon: Arlyne Lame, MD;  Location: [facility] ORS;  Service: Orthopedics;  Laterality: Left;    (Not in a hospital admission)  Social History   Socioeconomic History   Marital status: Single    Spouse name: Not on file   Number of children: Not on file   Years of education: Not on file   Highest education level: Not on file  Occupational History   Not on file  Tobacco Use   Smoking status: Former    Current packs/day: 1.00    Types: Cigarettes   Smokeless tobacco: Never  Vaping Use   Vaping status: Never Used  Substance and Sexual Activity   Alcohol use: No   Drug use: No   Sexual activity: Not on file  Other Topics Concern   Not on file  Social History Narrative   Not on file   Social Drivers of Health   Financial Resource Strain: Low Risk  (06/07/2023)   Received from [facility]   Overall Financial Resource Strain (CARDIA)    Difficulty  of Paying Living Expenses: Not hard at all  Food Insecurity: No Food Insecurity (06/07/2023)   Received from [facility]   Hunger Vital Sign    Worried About Running Out of Food in the Last Year: Never true    Ran Out of Food in the Last Year: Never true  Transportation Needs: No Transportation Needs (06/07/2023)   [facility] - Transportation    Lack of Transportation (Medical): No    Lack of Transportation (Non-Medical): No  Physical Activity: Not on file  Stress: Not on file  Social Connections: Moderately Isolated (04/16/2023)   Social Connection and Isolation Panel [NHANES]    Frequency of Communication with Friends and Family:  More than three times a week    Frequency of Social Gatherings with Friends and Family: More than three times a week    Attends Religious Services: More than 4 times per year    Active Member of [employer] or Organizations: No    Attends Banker Meetings: Never    Marital Status: Divorced  Catering manager Violence: Not At Risk (04/16/2023)   Humiliation, Afraid, Rape, and Kick questionnaire    Fear of Current or Ex-Partner: No    Emotionally Abused: No    Physically Abused: No    Sexually Abused: No    Family History  Problem Relation Age of Onset   Bladder Cancer Neg Hx    Kidney cancer Neg Hx    Prostate cancer Neg Hx      Vitals:   08/09/23 0326 08/09/23 0430 08/09/23 0600 08/09/23 0700  BP:  130/60 139/61 134/66  Pulse:  (!) 54 (!) 59 69  Resp:  (!) 24 (!) 25 (!) 25  Temp: 98.4 F (36.9 C)     TempSrc: Oral     SpO2:  97% 95% 95%  Weight:      Height:        PHYSICAL EXAM General: Ill-appearing elderly male, well nourished, in no acute distress. HEENT: Normocephalic and atraumatic. Neck: No JVD.  Lungs: Normal respiratory effort on BiPAP.  Diminished bilaterally Heart: Irregularly irregular, controlled rate. Normal S1 and S2 without gallops or murmurs.  Abdomen: Non-distended appearing.  Msk: Normal strength and tone for age. Extremities: Warm and well perfused. No clubbing, cyanosis.  2+ pitting edema bilaterally.  Neuro: Alert and oriented X 3. Psych: Answers questions appropriately.   Labs: Basic Metabolic Panel: Recent Labs    08/08/23 0907 08/09/23 0455  NA 139 139  K 3.8 3.8  CL 106 107  CO2 25 25  GLUCOSE 177* 98  BUN 30* 29*  CREATININE 1.37* 1.37*  CALCIUM  9.1 8.8*   Liver Function Tests: Recent Labs    08/08/23 0907 08/09/23 0455  AST 24 22  ALT 20 15  ALKPHOS 55 48  BILITOT 1.4* 1.5*  PROT 6.3* 5.7*  ALBUMIN 3.5 2.9*   No results for input(s): "LIPASE", "AMYLASE" in the last 72 hours. CBC: Recent Labs    08/08/23 0907  08/09/23 0455  WBC 10.1 8.2  NEUTROABS 6.8  --   HGB 12.1* 11.2*  HCT 38.5* 35.7*  MCV 90.2 89.3  PLT 157 155   Cardiac Enzymes: Recent Labs    08/08/23 0907 08/08/23 1101  TROPONINIHS 395* 483*   BNP: Recent Labs    08/08/23 0907  BNP 1,457.8*   D-Dimer: No results for input(s): "DDIMER" in the last 72 hours. Hemoglobin A1C: No results for input(s): "HGBA1C" in the  last 72 hours. Fasting Lipid Panel: No results for input(s): "CHOL", "HDL", "LDLCALC", "TRIG", "CHOLHDL", "LDLDIRECT" in the last 72 hours. Thyroid Function Tests: No results for input(s): "TSH", "T4TOTAL", "T3FREE", "THYROIDAB" in the last 72 hours.  Invalid input(s): "FREET3" Anemia Panel: No results for input(s): "VITAMINB12", "FOLATE", "FERRITIN", "TIBC", "IRON", "RETICCTPCT" in the last 72 hours.   Radiology: [facility] Chest Port 1 View Result Date: 08/08/2023 CLINICAL DATA:  Shortness of breath EXAM: PORTABLE CHEST 1 VIEW COMPARISON:  04/28/2023 FINDINGS: Cardiac shadow is enlarged but stable. Aortic calcifications are again seen. Patchy airspace opacity is noted in the upper lobes bilaterally. Stable calcified granuloma is noted in the left mid lung. Small effusions are seen bilaterally. IMPRESSION: Multifocal infiltrate with small effusions bilaterally. Electronically Signed   By: Violeta Grey M.D.   On: 08/08/2023 09:49    ECHO ordered  TELEMETRY reviewed by me 08/09/2023: Atrial fibrillation PVCs rate 50s  EKG reviewed by me: atrial fibrillation PVCs, baseline artifact, rate 90 bpm  Data reviewed by me 08/09/2023: last 24h vitals tele labs imaging I/O ED provider note, admission H&P  Principal Problem:   Acute on chronic respiratory failure with hypoxia (HCC) Active Problems:   COPD (chronic obstructive pulmonary disease) (HCC)   Benign hypertension   Essential hypertension   Paroxysmal atrial fibrillation (HCC)   GERD without esophagitis   Acute respiratory failure with hypoxia (HCC)   CKD stage  3a, GFR 45-59 ml/min (HCC)   NSTEMI (non-ST elevated myocardial infarction) (HCC)    ASSESSMENT AND PLAN:  NAM VOSSLER is a 89 y.o. male  with a past medical history of chronic HFpEF, chronic atrial fibrillation on Xarelto , COPD who presented to the ED on 08/08/2023 for worsening SOB and LE edema. Cardiology was consulted for further evaluation.   # Acute hypoxic respiratory failure # Acute on chronic HFpEF # Demand ischemia # Chronic atrial fibrillation # Frequent PVCs Patient brought to the ED by EMS due to respiratory distress.  Per chart review family reported the patient had had worsening shortness of breath and lower extremity edema prior to coming to the ED.  BNP elevated at 1400.  Troponins 395 > 483.  Placed on BiPAP by EMS.  Started on IV Lasix  in the ED. -Echo ordered. -Continue IV Lasix  40 mg twice daily.  Patient reportedly has not been taking p.o. Lasix  at home. -Start low dose spironolactone  this AM.  -Strict intake/output, monitor renal function and electrolytes closely with diuresis. -Continue Xarelto  20 mg daily.  Patient reportedly had not been taking this at home. -Mildly elevated troponin most consistent with demand/supply mismatch and not ACS.  No plan for further cardiac diagnostics at this time. -Continue to wean O2 as able.  -Palliative has been consulted by primary team.   This patient's plan of care was discussed and created with Dr. Beau Bound and he is in agreement.  Signed: Hamp Levine, PA-C  08/09/2023, 8:08 AM [facility] Cardiology

## 2023-08-09 NOTE — ED Notes (Signed)
 Sacral foam dressing applied to Pt d/t redness in sacral area.

## 2023-08-09 NOTE — ED Notes (Signed)
 Fall precautions in place for Pt. This RN placed fall band, fall grip socks, bed alarm and fall sign.

## 2023-08-09 NOTE — ED Notes (Signed)
 Pt given 1,240 ml's of ice water (pitcher and cup) at this time.

## 2023-08-09 NOTE — TOC Initial Note (Signed)
 Transition of Care [facility]) - Initial/Assessment Note    Patient Details  Name: Dennis Savage MRN: [id number] Date of Birth: 12-03-1929  [facility] CM/SW Contact:    Elmira Haddock, LCSW Phone Number: 08/09/2023, 3:08 PM  Clinical Narrative:                 CSW reviewed patient chart to complete readmit prevention screening.  Patient's PCP is Little Riff, MD. Patient lives with his daughter, [name] and family.  He has a walker at home that he uses.  Palliative Care met with patient to discuss GOC (see note).  Another conversation with patient and [name] is scheduled for tomorrow at 10am.  TOC to continue to follow for d/c planning needs.        Patient Goals and CMS Choice            Expected Discharge Plan and Services                                              Prior Living Arrangements/Services                       Activities of Daily Living      Permission Sought/Granted                  Emotional Assessment              Admission diagnosis:  Acute on chronic respiratory failure with hypoxia (HCC) [J96.21] Patient Active Problem List   Diagnosis Date Noted   Acute on chronic respiratory failure with hypoxia (HCC) 08/08/2023   NSTEMI (non-ST elevated myocardial infarction) (HCC) 08/08/2023   Overweight (BMI 25.0-29.9) 04/25/2023   Thrombocytopenia (HCC) 04/25/2023   Hypokalemia 04/25/2023   CKD stage 3a, GFR 45-59 ml/min (HCC) 04/08/2023   RSV (respiratory syncytial virus pneumonia) 04/05/2023   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   Exposure to potentially hazardous substance 01/13/2023   Sepsis due to pneumonia (HCC) 10/28/2022   Essential hypertension 10/28/2022   Paroxysmal atrial fibrillation (HCC) 10/28/2022   GERD without esophagitis 10/28/2022   Chronic diastolic CHF (congestive heart failure) (HCC) 10/28/2022   Acute respiratory failure with hypoxia (HCC) 10/28/2022   Generalized weakness  10/28/2022   Obesity (BMI 30-39.9) 06/21/2021   Pleural effusion on left 06/19/2021   CAP (community acquired pneumonia) 06/18/2021   Acute on chronic diastolic CHF (congestive heart failure) (HCC) 06/18/2021   Adrenal mass greater than 4 cm in diameter (HCC) 06/18/2021   Elevated troponin 06/18/2021   CHF (congestive heart failure) (HCC) 06/17/2021   Acute kidney injury superimposed on CKD (HCC) 06/17/2021   Acute CHF (congestive heart failure) (HCC) 06/17/2021   S/P total hip arthroplasty 10/12/2015   Degenerative arthritis of hip 09/12/2015   Atrial fibrillation (HCC) 07/13/2015   Apnea, sleep 07/13/2015   Diarrhea 02/25/2015   Dizziness 02/25/2015   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, 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   Retinal hemorrhage 03/16/2014   Interstitial lung disease (HCC) 10/13/2013   After cataract not obscuring vision 12/21/2011   History of surgical procedure 12/21/2011   Constipation 06/15/2011   Encounter for general adult medical examination without abnormal findings 06/15/2011   Arthralgia of multiple joints 06/15/2011   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   Artificial lens present 12/08/2010   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  Benign essential HTN 11/03/2010   COPD (chronic obstructive pulmonary disease) (HCC) 11/03/2010   Benign hypertension 11/03/2010   Low back pain 11/03/2010   Peripheral vascular disease (HCC) 11/03/2010   Current tobacco use 11/03/2010   PCP:  Little Riff, MD Pharmacy:   [facility] [address]), [place] - [address] [place] ([place]) [place] [address] Phone: [phone] Fax: [phone]  [facility] [place], [place] - [address] [place] [address] Phone: [phone] Fax: [phone]  [facility] [place], [place] - [address] [phone] [facility] [address] [place]  [address] Phone: [phone] Fax: [phone]     Social Drivers of Health (SDOH) Social History: SDOH Screenings   Food Insecurity: No Food Insecurity (06/07/2023)   Received from [facility]  Housing: Unknown (05/02/2023)   Received from [facility]  Transportation Needs: No Transportation Needs (06/07/2023)   Received from [facility]  Utilities: Low Risk  (06/07/2023)   Received from [facility]  Depression [phone]): Low Risk  (06/30/2021)  Financial Resource Strain: Low Risk  (06/07/2023)   Received from [facility]  Social Connections: Moderately Isolated (04/16/2023)  Tobacco Use: Medium Risk (08/08/2023)   SDOH Interventions:     Readmission Risk Interventions    04/18/2023    1:48 PM  Readmission Risk Prevention Plan  Transportation Screening Complete  Medication Review (RN Care Manager) Complete  HRI or Home Care Consult Complete  Palliative Care Screening Not Applicable  Skilled Nursing Facility Complete

## 2023-08-09 NOTE — ED Notes (Signed)
 Pt remains on 6 liters [place], humidified. Pt is AOX4, NAD noted. Wet cough noted.

## 2023-08-09 NOTE — Progress Notes (Signed)
 Progress Note   Patient: Dennis Savage ZOX:[id number] DOB: 1929/11/13 DOA: 08/08/2023     1 DOS: the patient was seen and examined on 08/09/2023   Brief hospital course: 89yo with h/o afib on Xarelto , HFpEF, COPD, prostate CA (on palliative therapy), stage 3a CKD, HTN, and subdural hygroma who presented on 4/24 with respiratory distress. He was noted to have acute on chronic diastolic CHF and was admitted with cardiology consultation.  He was diuresed with IV Lasix  with improvement and spironolactone  has been added.    Assessment and Plan:   Acute on chronic HFpEF Decompensated heart failure requiring BiPAP in the setting of volume overload and acute on chronic HFpEF Able to transition off BIPAP to [place] O2 (4L) as of this AM 2D ECHO 06/2023 in the [facility] system with EF 55-60%, severe biatrial enlargement  BNP 1450 Noted pleural effusions on chest x-ray Noted baseline COPD- does not appear to be in exacerbation at present  IV Lasix  BID Recurrent issue per the family Prn duonebs Consulting cardiology   NSTEMI (non-ST elevated myocardial infarction)  Troponin 400 presentation No active chest pain Suspect demand ischemia in setting of decompensated respiratory failure and volume overload on BiPAP Continue home Xarelto  for now   COPD (chronic obstructive pulmonary disease)  Noted baseline COPD Concurrent decompensated respiratory failure requiring BiPAP appears to be from cardiac origin rather than pulmonary Does not appear to be in acute exacerbation at present Continue home regiment Otherwise monitor   Essential hypertension BP stable  Hold amlodipine  in the setting of volume overload   Paroxysmal atrial fibrillation Rate controlled at present without medication Continue Xarelto  - it appears that he has not been taking this but review of prior visits indicates that it has not been stopped by a provider   GERD without esophagitis Continue omeprazole   CKD stage 3a, GFR 45-59  ml/min  Cr 1.37 w/ GFR in the 40s  Appears to be near baseline  Monitor w/ diuresis    Pressure injury  Pressure Injury 08/09/23 Coccyx Stage 1 -  Intact skin with non-blanchable redness of a localized area usually over a bony prominence. Redness (Active)  08/09/23 0420  Location: Coccyx  Location Orientation:   Staging: Stage 1 -  Intact skin with non-blanchable redness of a localized area usually over a bony prominence.  Wound Description (Comments): Redness  Present on Admission:    Prostate CA On Orgovyx  for palliation  DNR I have discussed code status with the patient/family and they are in agreement that the patient would not desire resuscitation and would prefer to die a natural death should that situation arise. Patient will need a gold out of facility DNR form at the time of discharge      Consultants: Cardiology Palliative care [facility] team  Procedures: Echocardiogram  Antibiotics: None  30 Day Unplanned Readmission Risk Score    Flowsheet Row ED to Hosp-Admission (Current) from 08/08/2023 in [facility] Emergency Department at [facility]  30 Day Unplanned Readmission Risk Score (%) 34.47 Filed at 08/09/2023 0801       This score is the patient's risk of an unplanned readmission within 30 days of being discharged (0 -100%). The score is based on dignosis, age, lab data, medications, orders, and past utilization.   Low:  0-14.9   Medium: 15-21.9   High: 22-29.9   Extreme: 30 and above            Subjective: Feeling some better today.  Still with edema and  SOB, but now on 4L [place] O2 instead of BIPAP.  Recurrent hospitalizations.  He is hoping to live to age 79.  Physical Exam: Vitals:   08/09/23 1315 08/09/23 1400 08/09/23 1430 08/09/23 1500  BP:  135/69 (!) 149/69 125/64  Pulse: 82 65 80 68  Resp: (!) 29 (!) 25 (!) 26 (!) 25  Temp:      TempSrc:      SpO2: 92% 92% 94% 93%  Weight:      Height:         Intake/Output Summary (Last 24 hours)  at 08/09/2023 1549 Last data filed at 08/09/2023 0640 Gross per 24 hour  Intake --  Output 1400 ml  Net -1400 ml   Filed Weights   08/08/23 0902  Weight: 91.7 kg    Exam:  General:  Appears calm and comfortable and is in NAD Eyes:   EOMI, normal iris ENT:  hard of hearing, grossly normal lips & tongue, mmm Cardiovascular:  RRR. 2-3+ LE edema.  Respiratory:   Bibasilar crackles.  Mildly increased respiratory effort. Abdomen:  soft, NT, ND Skin:  no rash or induration seen on limited exam Musculoskeletal:  generalized weakness, no bony abnormality Psychiatric:  blunted mood and affect, speech fluent and appropriate, AOx3 Neurologic:  CN 2-12 grossly intact, moves all extremities in coordinated fashion    Data Reviewed: I have reviewed the patient's lab results since admission.  Pertinent labs for today include:   BUN 29/Creatinine 1.37/GFR 49, stable Albumin 2.9 BNP 1457.8 HS troponin 385, 483 WBC 8.2 Hgb 11.2    Family Communication: Daughter was present throughout evaluation  Disposition: Status is: Inpatient Remains inpatient appropriate because: ongoing management  Planned Discharge Destination:  TBD    Time spent: 50 minutes  Author: Lorita Rosa, MD 08/09/2023 3:49 PM  For on call review [email or web address].

## 2023-08-09 NOTE — ED Notes (Signed)
 Pt ambulating to bathroom with walker at this time.

## 2023-08-10 ENCOUNTER — Inpatient Hospital Stay: Admit: 2023-08-10

## 2023-08-10 ENCOUNTER — Inpatient Hospital Stay (HOSPITAL_COMMUNITY)
Admit: 2023-08-10 | Discharge: 2023-08-10 | Disposition: A | Discharge status: Home / Self Care | Attending: Student | Admitting: Student

## 2023-08-10 DIAGNOSIS — J9601 Acute respiratory failure with hypoxia: Secondary | ICD-10-CM | POA: Diagnosis not present

## 2023-08-10 DIAGNOSIS — Z515 Encounter for palliative care: Secondary | ICD-10-CM

## 2023-08-10 DIAGNOSIS — Z66 Do not resuscitate: Secondary | ICD-10-CM | POA: Diagnosis not present

## 2023-08-10 DIAGNOSIS — Z7189 Other specified counseling: Secondary | ICD-10-CM

## 2023-08-10 DIAGNOSIS — I5033 Acute on chronic diastolic (congestive) heart failure: Secondary | ICD-10-CM | POA: Diagnosis not present

## 2023-08-10 DIAGNOSIS — J810 Acute pulmonary edema: Secondary | ICD-10-CM

## 2023-08-10 LAB — BASIC METABOLIC PANEL WITH GFR
Anion gap: 8 (ref 5–15)
BUN: 29 mg/dL — ABNORMAL HIGH (ref 8–23)
CO2: 27 mmol/L (ref 22–32)
Calcium: 8.4 mg/dL — ABNORMAL LOW (ref 8.9–10.3)
Chloride: 101 mmol/L (ref 98–111)
Creatinine, Ser: 1.35 mg/dL — ABNORMAL HIGH (ref 0.61–1.24)
GFR, Estimated: 50 mL/min — ABNORMAL LOW (ref >60)
Glucose, Bld: 121 mg/dL — ABNORMAL HIGH (ref 70–99)
Potassium: 3.3 mmol/L — ABNORMAL LOW (ref 3.5–5.1)
Sodium: 136 mmol/L (ref 135–145)

## 2023-08-10 LAB — CBC
HCT: 33.7 % — ABNORMAL LOW (ref 39.0–52.0)
Hemoglobin: 10.9 g/dL — ABNORMAL LOW (ref 13.0–17.0)
MCH: 28.5 pg (ref 26.0–34.0)
MCHC: 32.3 g/dL (ref 30.0–36.0)
MCV: 88.2 fL (ref 80.0–100.0)
Platelets: 160 10*3/uL (ref 150–400)
RBC: 3.82 MIL/uL — ABNORMAL LOW (ref 4.22–5.81)
RDW: 16.6 % — ABNORMAL HIGH (ref 11.5–15.5)
WBC: 6.6 10*3/uL (ref 4.0–10.5)
nRBC: 0 % (ref 0.0–0.2)

## 2023-08-10 LAB — ECHOCARDIOGRAM COMPLETE
Height: 69 in
S' Lateral: 3.9 cm
Weight: 3259.28 [oz_av]

## 2023-08-10 MED ORDER — IPRATROPIUM-ALBUTEROL 0.5-2.5 (3) MG/3ML IN SOLN
3.0000 mL | Freq: Three times a day (TID) | RESPIRATORY_TRACT | Status: DC
  Administered 2023-08-10 – 2023-08-13 (×9): 3 mL via RESPIRATORY_TRACT
  Filled 2023-08-10 (×9): qty 3

## 2023-08-10 NOTE — Hospital Course (Signed)
 89yo with h/o afib on Xarelto , HFpEF, COPD, prostate CA (on palliative therapy), stage 3a CKD, HTN, and subdural hygroma who presented on 4/24 with respiratory distress. He was noted to have acute on chronic diastolic CHF and was admitted with cardiology consultation. He was diuresed with IV Lasix  with improvement and spironolactone  has been added.

## 2023-08-10 NOTE — Progress Notes (Addendum)
 Palliative Care Progress Note, Assessment & Plan   Patient Name: Dennis Savage       Date: 08/10/2023 DOB: 1930/03/27  Age: 89 y.o. MRN#: [id number] Attending Physician: Dennis Rosa, MD Primary Care Physician: Dennis Riff, MD Admit Date: 08/08/2023  Subjective: Pt resting in bed. He reports feeling better than when he came in but weak and tired today. He denies CP or SOB. Did not eat breakfast because he did not like it.   HPI: 89 y.o. male  with past medical history of proximal A-fib (Xarelto ), HFpEF, COPD, prostate cancer, CKD (stage III), HTN, GERD, and subdural hygroma admitted on 08/08/2023 with shortness of breath.  Patient had recent admission at [facility] for the same (March 2025).   Patient is being treated for acute on chronic HFrEF, acute respiratory failure with volume overload, NSTEMI, and COPD.   PMT was consulted to support patient and family with goals of care discussions.  Summary of counseling/coordination of care: Extensive chart review completed prior to meeting patient including labs, vital signs, imaging, progress notes, orders, and available advanced directive documents from current and previous encounters.   After reviewing the patient's chart and assessing the patient at bedside, I spoke with patient in regards to symptom management and goals of care. I attempted to speak with daughter and was unable to reach by phone. Left HIPAA compliant VM.   Ill-appearing, elderly male sleeping in bed. He awakens easily to verbal stimuli. A&O to self, location and situation. He is in no distress.   Discussed with patient his short term and long term goals of care. He shares that he wants to be well enough to return home and ambulate with walker. He understands that he has never needed oxygen  before but is now requiring significant oxygen requirement just lying in bed. He states he wants to go back home several times during visit. Long term goal is to live to be 89 years old.   When talking about the care he is receiving while he is admitted, he confirms that he would never want to be placed on breathing machine. In the event he were to be confused and unable to make medical decisions for himself, he reports that Dennis Savage, daughter, is his appointed Management consultant.   Therapeutic silence and active listening provided for patient to share his thoughts and emotions regarding current medical situation.  Emotional support provided.  Attempted to call daughter, Dennis Savage, to further discuss goals of care. Was unable to contact and left HIPAA compliant voicemail. Return call was not received.   Physical Exam Vitals reviewed.  Constitutional:      General: He is not in acute distress.    Appearance: He is ill-appearing.  HENT:     Nose:     Comments: O2 via [place]    Mouth/Throat:     Mouth: Mucous membranes are dry.  Pulmonary:     Effort: Pulmonary effort is normal. No respiratory distress.  Musculoskeletal:     Right lower leg: Edema present.     Left lower leg: Edema present.  Skin:    General: Skin is warm and dry.  Neurological:     Mental Status: He is alert  and oriented to person, place, and time.  Psychiatric:        Mood and Affect: Mood normal.        Behavior: Behavior normal.        Thought Content: Thought content normal.     Recommendations/Plan: Continue DNR/DNI status as previously documented    Continue current supportive interventions PMT will continue to follow   Total Time 35 minutes   Time spent includes: Detailed review of medical records (labs, imaging, vital signs), medically appropriate exam (mental status, respiratory, cardiac, skin), discussed with treatment team, counseling and educating patient, family and staff, documenting clinical information,  medication management and coordination of care.     Dennis Savage, Dennis Savage [facility] Palliative Medicine Team  08/10/2023 11:57 AM  Office [phone]  Pager [phone]

## 2023-08-10 NOTE — Progress Notes (Signed)
 Progress Note   Patient: Dennis Savage DGU:[id number] DOB: [birth year] DOA: 08/08/2023     2 DOS: the patient was seen and examined on 08/10/2023   Brief hospital course: 89yo with h/o afib on Xarelto , HFpEF, COPD, prostate CA (on palliative therapy), stage 3a CKD, HTN, and subdural hygroma who presented on 4/24 with respiratory distress. He was noted to have acute on chronic diastolic CHF and was admitted with cardiology consultation. He was diuresed with IV Lasix  with improvement and spironolactone  has been added.   Assessment and Plan:  Acute on chronic HFpEF Decompensated heart failure requiring BiPAP in the setting of volume overload and acute on chronic HFpEF Able to transition off BIPAP to [place] O2 (5L as of this AM) 2D ECHO 06/2023 in the [facility] system with EF 55-60%, severe biatrial enlargement  BNP 1450 Noted pleural effusions on chest x-ray Noted baseline COPD- does not appear to be in exacerbation at present  IV Lasix  BID Recurrent issue per the family Prn duonebs Consulting cardiology   NSTEMI (non-ST elevated myocardial infarction)  Troponin 400 presentation No active chest pain Suspect demand ischemia in setting of decompensated respiratory failure and volume overload on BiPAP Continue home Xarelto  for now   COPD (chronic obstructive pulmonary disease)  Noted baseline COPD Concurrent decompensated respiratory failure requiring BiPAP appears to be from cardiac origin rather than pulmonary Does not appear to be in acute exacerbation at present Continue home regiment Otherwise monitor   Essential hypertension BP stable  Hold amlodipine  in the setting of volume overload   Paroxysmal atrial fibrillation Rate controlled at present without medication Continue Xarelto  - it appears that he has not been taking this but review of prior visits indicates that it has not been stopped by a provider   GERD without esophagitis Continue omeprazole   CKD stage 3a, GFR 45-59 ml/min   Cr 1.37 w/ GFR in the 40s  Appears to be near baseline  Monitor w/ diuresis    Pressure injury  Pressure Injury 08/09/23 Coccyx Stage 1 -  Intact skin with non-blanchable redness of a localized area usually over a bony prominence. Redness (Active)  08/09/23 0420  Location: Coccyx  Location Orientation:   Staging: Stage 1 -  Intact skin with non-blanchable redness of a localized area usually over a bony prominence.  Wound Description (Comments): Redness  Present on Admission:     Prostate CA On Orgovyx  for palliation   DNR I have discussed code status with the patient/family and they are in agreement that the patient would not desire resuscitation and would prefer to die a natural death should that situation arise. Patient will need a gold out of facility DNR form at the time of discharge        Consultants: Cardiology Palliative care [facility] team   Procedures: Echocardiogram   Antibiotics: None    30 Day Unplanned Readmission Risk Score    Flowsheet Row ED to Hosp-Admission (Current) from 08/08/2023 in [facility]  30 Day Unplanned Readmission Risk Score (%) 37.18 Filed at 08/10/2023 0800       This score is the patient's risk of an unplanned readmission within 30 days of being discharged (0 -100%). The score is based on dignosis, age, lab data, medications, orders, and past utilization.   Low:  0-14.9   Medium: 15-21.9   High: 22-29.9   Extreme: 30 and above           Subjective: Remains SOB but overall improved.  He  is fatigued.    Objective: Vitals:   08/10/23 0817 08/10/23 1237  BP: 128/64 121/76  Pulse: 62 81  Resp: 17 16  Temp: 98.6 F (37 C) 98.1 F (36.7 C)  SpO2: 95% 94%    Intake/Output Summary (Last 24 hours) at 08/10/2023 1502 Last data filed at 08/10/2023 1133 Gross per 24 hour  Intake 1300 ml  Output 2225 ml  Net -925 ml   Filed Weights   08/08/23 0902 08/10/23 0411  Weight: 91.7 kg 92.4 kg     Exam:  General:  Appears calm and comfortable and is in NAD Eyes:   EOMI, normal iris ENT:  hard of hearing, grossly normal lips & tongue, mmm Cardiovascular:  RRR. 1-2+ LE edema, improved.  Respiratory:   CTAB.  Mildly increased respiratory effort. Abdomen:  soft, NT, ND Skin:  no rash or induration seen on limited exam Musculoskeletal:  generalized weakness, no bony abnormality Psychiatric:  blunted mood and affect, speech fluent and appropriate, AOx3 Neurologic:  CN 2-12 grossly intact, moves all extremities in coordinated fashion  Data Reviewed: I have reviewed the patient's lab results since admission.  Pertinent labs for today include:  K+ 3.3 Glucose 121 BUN 29/Creatinine 1.35/GFR 50, stable WBC 6.6 Hgb 10.9      Family Communication: None present today  Disposition: Status is: Inpatient Remains inpatient appropriate because: ongoing management     Time spent: 50 minutes  Unresulted Labs (From admission, onward)     Start     Ordered   Unscheduled  CBC with Differential/Platelet  Tomorrow morning,   R        08/10/23 1502   Unscheduled  Basic metabolic panel with GFR  Tomorrow morning,   R        08/10/23 1502             Author: Lorita Rosa, MD 08/10/2023 3:02 PM  For on call review [email or web address].

## 2023-08-10 NOTE — Evaluation (Signed)
 Occupational Therapy Evaluation Patient Details Name: Dennis Savage MRN: [id number] DOB: 10-08-1929 Today's Date: 08/10/2023   History of Present Illness   89 y.o. male  with past medical history of proximal A-fib (Xarelto ), HFpEF, COPD, prostate cancer, CKD (stage III), HTN, GERD, and subdural hygroma admitted on 08/08/2023 with shortness of breath.  Patient had recent admission at [facility] for the same (March 2025). Patient is being treated for acute on chronic HFrEF, acute respiratory failure with volume overload, NSTEMI, and COPD.     Clinical Impressions PTA, pt reports using a RW for household ambulation and 4WW for community mobility. Is able to perform BADLs mod independent with family assisting for IADLs. Pt lives with children and grandchildren. Pt received after respiratory treatment on 3L O2 via [place]. Pt limited by poor tolerance to activity, DOE/WOB with minimal exertion, impaired strength and balance. Requires MIN A for bed mobility, is able to stand from elevated bed height with MIN A + RW, and takes a few lateral sidesteps before fatiguing and requiring seated recovery break. SpO2 drops to 92% with activity. UB bathing/dressing seated EOB with setup - MIN A. Pt pleasant and motivated to return to PLOF.  Pt would benefit from skilled OT services to address noted impairments and functional limitations (see below for any additional details) in order to maximize safety and independence while minimizing falls risk and caregiver burden. Anticipate the need for follow up OT services upon acute hospital DC. Patient will benefit from continued inpatient follow up therapy, <3 hours/day.      If plan is discharge home, recommend the following:   A lot of help with walking and/or transfers;A lot of help with bathing/dressing/bathroom;Assistance with cooking/housework;Assist for transportation;Help with stairs or ramp for entrance     Functional Status Assessment   Patient has had a recent  decline in their functional status and demonstrates the ability to make significant improvements in function in a reasonable and predictable amount of time.     Equipment Recommendations   None recommended by OT (defer)      Precautions/Restrictions   Precautions Precautions: Fall Precaution/Restrictions Comments: monitor o2 Restrictions Weight Bearing Restrictions Per Provider Order: No     Mobility Bed Mobility Overal bed mobility: Needs Assistance Bed Mobility: Sit to Supine, Supine to Sit     Supine to sit: Min assist Sit to supine: Min assist   General bed mobility comments: HHA and use of bed features to come towards EOB; minA to fully transition hips and cues to scoot hips forward    Transfers Overall transfer level: Needs assistance Equipment used: Rolling walker (2 wheels) Transfers: Sit to/from Stand Sit to Stand: Min assist           General transfer comment: requires minA and elevated bed to rise, limited standing tolerance, able to take a few side steps towards [facility] but declines further transfer to recliner      Balance Overall balance assessment: Needs assistance Sitting-balance support: No upper extremity supported, Feet supported Sitting balance-Leahy Scale: Fair     Standing balance support: During functional activity, Reliant on assistive device for balance, Bilateral upper extremity supported Standing balance-Leahy Scale: Poor Standing balance comment: minA for static standing balance, poor tolerance                           ADL either performed or assessed with clinical judgement   ADL Overall ADL's : Needs assistance/impaired Eating/Feeding: Set up;Sitting  Grooming: Wash/dry hands;Wash/dry face;Set up;Sitting   Upper Body Bathing: Sitting;Set up Upper Body Bathing Details (indicate cue type and reason): UB bathing sitting EOB, poor tolerance to activity and fatigues quickly. setup for task performance, but cues for PLB  and recovery breaks throughout Lower Body Bathing: Sit to/from stand;Maximal assistance   Upper Body Dressing : Minimal assistance;Sitting Upper Body Dressing Details (indicate cue type and reason): dons/doffs gown     Toilet Transfer: Minimal assistance;BSC/3in1;Rolling walker (2 wheels) Toilet Transfer Details (indicate cue type and reason): clinical judgement Toileting- Clothing Manipulation and Hygiene: Sit to/from stand;Maximal assistance       Functional mobility during ADLs: Minimal assistance;Rolling walker (2 wheels) General ADL Comments: Generally weak and poor tolerance to activity, DOE quickly with minimal activity     Vision Baseline Vision/History: 1 Wears glasses Ability to See in Adequate Light: 0 Adequate Patient Visual Report: No change from baseline              Pertinent Vitals/Pain Pain Assessment Pain Assessment: No/denies pain     Extremity/Trunk Assessment Upper Extremity Assessment Upper Extremity Assessment: Right hand dominant;Generalized weakness   Lower Extremity Assessment Lower Extremity Assessment: Generalized weakness   Cervical / Trunk Assessment Cervical / Trunk Assessment: Normal   Communication Communication Communication: Impaired Factors Affecting Communication: Hearing impaired   Cognition Arousal: Alert Behavior During Therapy: WFL for tasks assessed/performed                                 Following commands: Intact       Cueing  General Comments   Cueing Techniques: Verbal cues  BP end of session 135/63, HR 67, SpO2 on 3L/min 97%           Home Living Family/patient expects to be discharged to:: Private residence Living Arrangements: Children (daughter and son in Social worker, grandchildren) Available Help at Discharge: Available PRN/intermittently Type of Home: House Home Access: Stairs to enter Secretary/administrator of Steps: 1   Home Layout: One level     Bathroom Shower/Tub: Multimedia programmer: Standard     Home Equipment: Rollator (4 wheels);Cane - single point;Wheelchair - manual;Grab bars - tub/shower;Shower seat;Tub bench;Toilet riser          Prior Functioning/Environment Prior Level of Function : Needs assist             Mobility Comments: RW or 4WW ADLs Comments: MOD I-I in ADL, assist with IADLs- driving, shopping, cooking    OT Problem List: Decreased strength;Decreased activity tolerance;Impaired balance (sitting and/or standing);Decreased knowledge of use of DME or AE;Cardiopulmonary status limiting activity   OT Treatment/Interventions: Self-care/ADL training;Energy conservation;DME and/or AE instruction;Therapeutic activities;Patient/family education;Balance training      OT Goals(Current goals can be found in the care plan section)   Acute Rehab OT Goals Patient Stated Goal: to go home OT Goal Formulation: With patient Time For Goal Achievement: 08/24/23 Potential to Achieve Goals: Good   OT Frequency:  Min 2X/week       AM-PAC OT "6 Clicks" Daily Activity     Outcome Measure Help from another person eating meals?: None Help from another person taking care of personal grooming?: None Help from another person toileting, which includes using toliet, bedpan, or urinal?: A Lot Help from another person bathing (including washing, rinsing, drying)?: A Little Help from another person to put on and taking off regular upper body clothing?: A Little Help  from another person to put on and taking off regular lower body clothing?: A Lot 6 Click Score: 18   End of Session Equipment Utilized During Treatment: Rolling walker (2 wheels);Gait belt;Oxygen Nurse Communication: Mobility status  Activity Tolerance: Patient limited by fatigue Patient left: in bed;with call bell/phone within reach;with bed alarm set  OT Visit Diagnosis: Muscle weakness (generalized) (M62.81);Unsteadiness on feet (R26.81)                Time:  1425-1501 OT Time Calculation (min): 36 min Charges:  OT General Charges $OT Visit: 1 Visit OT Evaluation $OT Eval Low Complexity: 1 Low OT Treatments $Self Care/Home Management : 23-37 mins  Maynor Mwangi L. Margurete Guaman, OTR/L  08/10/23, 3:14 PM

## 2023-08-10 NOTE — Progress Notes (Signed)
  Echocardiogram 2D Echocardiogram has been performed.  Dione Franks 08/10/2023, 9:57 AM

## 2023-08-10 NOTE — Progress Notes (Signed)
 Patient ID: Dennis Savage, male   DOB: 11-24-1929, 89 y.o.   MRN: [id number] [facility] Cardiology    SUBJECTIVE: Resting comfortably still short of breath but slightly improved denies any palpitations or tachycardia no chest pain no significant leg edema using inhalers supplemental oxygen as necessary   Vitals:   08/10/23 0411 08/10/23 0542 08/10/23 0817 08/10/23 1237  BP:   128/64 121/76  Pulse:   62 81  Resp:   17 16  Temp:   98.6 F (37 C) 98.1 F (36.7 C)  TempSrc:      SpO2:  94% 95% 94%  Weight: 92.4 kg     Height:         Intake/Output Summary (Last 24 hours) at 08/10/2023 1314 Last data filed at 08/10/2023 1100 Gross per 24 hour  Intake 1300 ml  Output 1425 ml  Net -125 ml      PHYSICAL EXAM  General: Well developed, well nourished, in no acute distress HEENT:  Normocephalic and atramatic Neck:  No JVD.  Lungs: Clear bilaterally to auscultation and percussion. Heart: HRRR . Normal S1 and S2 without gallops or murmurs.  Abdomen: Bowel sounds are positive, abdomen soft and non-tender  Msk:  Back normal, normal gait. Normal strength and tone for age. Extremities: No clubbing, cyanosis or edema.   Neuro: Alert and oriented X 3. Psych:  Good affect, responds appropriately   LABS: Basic Metabolic Panel: Recent Labs    08/09/23 0455 08/10/23 0615  NA 139 136  K 3.8 3.3*  CL 107 101  CO2 25 27  GLUCOSE 98 121*  BUN 29* 29*  CREATININE 1.37* 1.35*  CALCIUM  8.8* 8.4*   Liver Function Tests: Recent Labs    08/08/23 0907 08/09/23 0455  AST 24 22  ALT 20 15  ALKPHOS 55 48  BILITOT 1.4* 1.5*  PROT 6.3* 5.7*  ALBUMIN 3.5 2.9*   No results for input(s): "LIPASE", "AMYLASE" in the last 72 hours. CBC: Recent Labs    08/08/23 0907 08/09/23 0455 08/10/23 0615  WBC 10.1 8.2 6.6  NEUTROABS 6.8  --   --   HGB 12.1* 11.2* 10.9*  HCT 38.5* 35.7* 33.7*  MCV 90.2 89.3 88.2  PLT 157 155 160   Cardiac Enzymes: No results for input(s): "CKTOTAL", "CKMB",  "CKMBINDEX", "TROPONINI" in the last 72 hours. BNP: Invalid input(s): "POCBNP" D-Dimer: No results for input(s): "DDIMER" in the last 72 hours. Hemoglobin A1C: No results for input(s): "HGBA1C" in the last 72 hours. Fasting Lipid Panel: No results for input(s): "CHOL", "HDL", "LDLCALC", "TRIG", "CHOLHDL", "LDLDIRECT" in the last 72 hours. Thyroid Function Tests: No results for input(s): "TSH", "T4TOTAL", "T3FREE", "THYROIDAB" in the last 72 hours.  Invalid input(s): "FREET3" Anemia Panel: No results for input(s): "VITAMINB12", "FOLATE", "FERRITIN", "TIBC", "IRON", "RETICCTPCT" in the last 72 hours.  No results found.   Echo borderline left ventricular function EF around 50-55 %  TELEMETRY: Atrial fibrillation rate of 77:  ASSESSMENT AND PLAN:  Principal Problem:   Acute on chronic diastolic CHF (congestive heart failure) (HCC) Active Problems:   COPD (chronic obstructive pulmonary disease) (HCC)   Benign hypertension   Essential hypertension   Paroxysmal atrial fibrillation (HCC)   GERD without esophagitis   Acute respiratory failure with hypoxia (HCC)   CKD stage 3a, GFR 45-59 ml/min (HCC)   NSTEMI (non-ST elevated myocardial infarction) (HCC)   Pressure injury of skin    Plan Acute on chronic hypoxic respiratory failure improving on supplemental oxygen as well as  diuretics improved dyspnea continue current therapy Atrial fibrillation acute on chronic on Xarelto  for anticoagulation continue rate control management currently A-fib 77 COPD continue inhalers as necessary consider pulmonary input Elevated troponins moderately probably demand ischemia recommend conservative management do not recommend invasive strategy at this point   Antonette Batters, MD 08/10/2023 1:14 PM

## 2023-08-11 DIAGNOSIS — Z66 Do not resuscitate: Secondary | ICD-10-CM | POA: Diagnosis not present

## 2023-08-11 DIAGNOSIS — Z7189 Other specified counseling: Secondary | ICD-10-CM | POA: Diagnosis not present

## 2023-08-11 DIAGNOSIS — J9601 Acute respiratory failure with hypoxia: Secondary | ICD-10-CM | POA: Diagnosis not present

## 2023-08-11 DIAGNOSIS — I5033 Acute on chronic diastolic (congestive) heart failure: Secondary | ICD-10-CM | POA: Diagnosis not present

## 2023-08-11 DIAGNOSIS — Z515 Encounter for palliative care: Secondary | ICD-10-CM | POA: Diagnosis not present

## 2023-08-11 LAB — CBC WITH DIFFERENTIAL/PLATELET
Abs Immature Granulocytes: 0.03 10*3/uL (ref 0.00–0.07)
Basophils Absolute: 0 10*3/uL (ref 0.0–0.1)
Basophils Relative: 0 %
Eosinophils Absolute: 0 10*3/uL (ref 0.0–0.5)
Eosinophils Relative: 0 %
HCT: 34.4 % — ABNORMAL LOW (ref 39.0–52.0)
Hemoglobin: 11.5 g/dL — ABNORMAL LOW (ref 13.0–17.0)
Immature Granulocytes: 0 %
Lymphocytes Relative: 21 %
Lymphs Abs: 1.4 10*3/uL (ref 0.7–4.0)
MCH: 28.3 pg (ref 26.0–34.0)
MCHC: 33.4 g/dL (ref 30.0–36.0)
MCV: 84.7 fL (ref 80.0–100.0)
Monocytes Absolute: 1.3 10*3/uL — ABNORMAL HIGH (ref 0.1–1.0)
Monocytes Relative: 20 %
Neutro Abs: 3.9 10*3/uL (ref 1.7–7.7)
Neutrophils Relative %: 59 %
Platelets: 199 10*3/uL (ref 150–400)
RBC: 4.06 MIL/uL — ABNORMAL LOW (ref 4.22–5.81)
RDW: 16.4 % — ABNORMAL HIGH (ref 11.5–15.5)
WBC: 6.7 10*3/uL (ref 4.0–10.5)
nRBC: 0 % (ref 0.0–0.2)

## 2023-08-11 LAB — BASIC METABOLIC PANEL WITH GFR
Anion gap: 6 (ref 5–15)
BUN: 28 mg/dL — ABNORMAL HIGH (ref 8–23)
CO2: 30 mmol/L (ref 22–32)
Calcium: 8.4 mg/dL — ABNORMAL LOW (ref 8.9–10.3)
Chloride: 98 mmol/L (ref 98–111)
Creatinine, Ser: 1.41 mg/dL — ABNORMAL HIGH (ref 0.61–1.24)
GFR, Estimated: 47 mL/min — ABNORMAL LOW (ref >60)
Glucose, Bld: 119 mg/dL — ABNORMAL HIGH (ref 70–99)
Potassium: 3.4 mmol/L — ABNORMAL LOW (ref 3.5–5.1)
Sodium: 134 mmol/L — ABNORMAL LOW (ref 135–145)

## 2023-08-11 MED ORDER — POTASSIUM CHLORIDE CRYS ER 20 MEQ PO TBCR
40.0000 meq | EXTENDED_RELEASE_TABLET | Freq: Once | ORAL | Status: AC
  Administered 2023-08-11: 40 meq via ORAL
  Filled 2023-08-11: qty 2

## 2023-08-11 NOTE — Progress Notes (Signed)
 Palliative Care Progress Note, Assessment & Plan   Patient Name: Dennis Savage       Date: 08/11/2023 DOB: [birth year]  Age: 89 y.o. MRN#: [id number] Attending Physician: Lorita Rosa, MD Primary Care Physician: Little Riff, MD Admit Date: 08/08/2023  Subjective: Feels better than yesterday with some weakness. Breathing better today but complains of activity related SOB. Did not eat breakfast because he is not hungry and does not like eating in bed. States he will eat lunch since he is up in recliner. Denies pain.   HPI: 89 y.o. male  with past medical history of proximal A-fib (Xarelto ), HFpEF, COPD, prostate cancer, CKD (stage III), HTN, GERD, and subdural hygroma admitted on 08/08/2023 with shortness of breath.  Patient had recent admission at [facility] for the same (March 2025).   Patient is being treated for acute on chronic HFrEF, acute respiratory failure with volume overload, NSTEMI, and COPD.   PMT was consulted to support patient and family with goals of care discussions.  Summary of counseling/coordination of care: Extensive chart review completed prior to meeting patient including labs, vital signs, imaging, progress notes, orders, and available advanced directive documents from current and previous encounters.   After reviewing the patient's chart and assessing the patient at bedside, I spoke with patient/daughter in regards to symptom management and goals of care.   Ill-appearing, elderly male sitting up in recliner. He he is A&O, calm and pleasant. Even, unlabored respirations. He is in no distress. Pt daughter, [name] and 2 great-grandchildren at bedside.  Discussed with daughter GOC and plan after this hospitalization outside room while patient works with PT to transfer from bed to  recliner.  [name] reports that her father lives with her. She has seen decline in function with multiple hospitalizations since his hip surgery November 2024. Explained the trajectory of chronic disease and difficulty in getting back to previous baseline especially with her father's advanced age. Her goal is for him to return home. [name] shares that [facility] what resources he has for re[facility] if needed and what is covered under his benefit. She wants more time for outcomes. [name] confirms DNR status for her father.   Therapeutic silence and active listening provided for daughter to share her thoughts and emotions regarding current medical situation.  Emotional support provided.  Physical Exam Constitutional:      General: He is not in acute distress.    Appearance: He is ill-appearing.  HENT:     Head: Normocephalic and atraumatic.     Nose:     Comments: O2 via [place]    Mouth/Throat:     Mouth: Mucous membranes are dry.  Pulmonary:     Effort: Pulmonary effort is normal. No respiratory distress.  Musculoskeletal:     Right lower leg: Edema present.     Left lower leg: Edema present.     Comments: Mild BLE edema  Skin:    General: Skin is warm and dry.  Neurological:     Mental Status: He is alert and oriented to person, place, and time.  Psychiatric:        Mood and Affect: Mood normal.  Behavior: Behavior normal.        Thought Content: Thought content normal.        Judgment: Judgment normal.     Recommendations/Plan: Continue DNR/DNI status as previously documented    Continue current supportive interventions Allow time for outcomes  PMT will continue to follow peripherally as needed          Total Time 25 minutes   Time spent includes: Detailed review of medical records (labs, imaging, vital signs), medically appropriate exam (mental status, respiratory, cardiac, skin), discussed with treatment team, counseling and educating patient, family and  staff, documenting clinical information, medication management and coordination of care.     Ina Manas, Joyice Nodal [facility] Palliative Medicine Team  08/11/2023 8:36 AM  Office [phone]  Pager [phone]

## 2023-08-11 NOTE — Plan of Care (Signed)

## 2023-08-11 NOTE — Progress Notes (Signed)
 Progress Note   Patient: Dennis Savage BJY:[id number] DOB: [birth year] DOA: 08/08/2023     3 DOS: the patient was seen and examined on 08/11/2023   Brief hospital course: 89yo with h/o afib on Xarelto , HFpEF, COPD, prostate CA (on palliative therapy), stage 3a CKD, HTN, and subdural hygroma who presented on 4/24 with respiratory distress. He was noted to have acute on chronic diastolic CHF and was admitted with cardiology consultation. He was diuresed with IV Lasix  with improvement and spironolactone  has been added.   Assessment and Plan:  Acute on chronic HFpEF Decompensated heart failure requiring BiPAP in the setting of volume overload and acute on chronic HFpEF Able to transition off BIPAP to [place] O2 (5L as of this AM) 2D ECHO 06/2023 in the [facility] system with EF 55-60%, severe biatrial enlargement  BNP 1450 Noted pleural effusions on chest x-ray Noted baseline COPD- does not appear to be in exacerbation at present  IV Lasix  BID Recurrent issue per the family Prn duonebs Consulting cardiology   NSTEMI (non-ST elevated myocardial infarction)  Troponin 400 presentation No active chest pain Suspect demand ischemia in setting of decompensated respiratory failure and volume overload on BiPAP Continue home Xarelto  for now   COPD (chronic obstructive pulmonary disease)  Noted baseline COPD Concurrent decompensated respiratory failure requiring BiPAP appears to be from cardiac origin rather than pulmonary Does not appear to be in acute exacerbation at present Continue home regiment Otherwise monitor   Essential hypertension BP stable  Hold amlodipine  in the setting of volume overload   Paroxysmal atrial fibrillation Rate controlled at present without medication Continue Xarelto  - it appears that he has not been taking this but review of prior visits indicates that it has not been stopped by a provider   GERD without esophagitis Continue omeprazole   CKD stage 3a, GFR 45-59 ml/min   Cr 1.37 w/ GFR in the 40s  Appears to be near baseline  Monitor w/ diuresis    Pressure injury  Pressure Injury 08/09/23 Coccyx Stage 1 -  Intact skin with non-blanchable redness of a localized area usually over a bony prominence. Redness (Active)  08/09/23 0420  Location: Coccyx  Location Orientation:   Staging: Stage 1 -  Intact skin with non-blanchable redness of a localized area usually over a bony prominence.  Wound Description (Comments): Redness  Present on Admission:     Prostate CA On Orgovyx  for palliation   DNR I have discussed code status with the patient/family and they are in agreement that the patient would not desire resuscitation and would prefer to die a natural death should that situation arise. Patient will need a gold out of facility DNR form at the time of discharge   Deconditioning PT/OT consulting Recommending SNF rehab ([facility])        Consultants: Cardiology Palliative care PT OT [facility] team   Procedures: Echocardiogram   Antibiotics: None     30 Day Unplanned Readmission Risk Score    Flowsheet Row ED to Hosp-Admission (Current) from 08/08/2023 in [facility]U  30 Day Unplanned Readmission Risk Score (%) 37.29 Filed at 08/11/2023 0801       This score is the patient's risk of an unplanned readmission within 30 days of being discharged (0 -100%). The score is based on dignosis, age, lab data, medications, orders, and past utilization.   Low:  0-14.9   Medium: 15-21.9   High: 22-29.9   Extreme: 30 and above  Subjective: Slowly getting better.  Edema is improving, breathing is improving.  OOB to chair today.   Objective: Vitals:   08/11/23 0956 08/11/23 1318  BP: (!) 105/54 125/63  Pulse: 83 70  Resp: 18 16  Temp: 97.9 F (36.6 C) 97.7 F (36.5 C)  SpO2: 95% 94%    Intake/Output Summary (Last 24 hours) at 08/11/2023 1417 Last data filed at 08/11/2023 1051 Gross per 24 hour  Intake 720  ml  Output 1000 ml  Net -280 ml   Filed Weights   08/08/23 0902 08/10/23 0411 08/11/23 0444  Weight: 91.7 kg 92.4 kg 90.5 kg    Exam:  General:  Appears calm and comfortable and is in NAD, on [place] O2 Eyes:   EOMI, normal iris ENT:  hard of hearing, grossly normal lips & tongue, mmm Cardiovascular:  RRR. 1-2+ LE edema, improved.  Respiratory:   CTAB.  Mildly increased respiratory effort. Abdomen:  soft, NT, ND Skin:  no rash or induration seen on limited exam Musculoskeletal:  generalized weakness, no bony abnormality Psychiatric:  blunted mood and affect, speech fluent and appropriate, AOx3 Neurologic:  CN 2-12 grossly intact, moves all extremities in coordinated fashion  Data Reviewed: I have reviewed the patient's lab results since admission.  Pertinent labs for today include:   Na++ 134 K+ 3.4 Glucose 119 BUN 28/Creatinine 1.41/GFR 47, stable WBC 6.7 Hgb 11.5, stable     Family Communication: None present  Disposition: Status is: Inpatient Remains inpatient appropriate because: ongoing management     Time spent: 35 minutes  Unresulted Labs (From admission, onward)     Start     Ordered   08/12/23 0500  CBC with Differential/Platelet  Tomorrow morning,   R        08/11/23 1417   08/12/23 0500  Basic metabolic panel with GFR  Tomorrow morning,   R        08/11/23 1417             Author: Lorita Rosa, MD 08/11/2023 2:17 PM  For on call review [email or web address].

## 2023-08-11 NOTE — Progress Notes (Signed)
 Patient ID: Dennis Savage, male   DOB: [birth year], 89 y.o.   MRN: [id number] [facility] Cardiology    SUBJECTIVE: Currently sleeping in bed no symptoms feels reasonably well still has some shortness of breath and dyspnea denies any chest pain   Vitals:   08/11/23 0432 08/11/23 0444 08/11/23 0617 08/11/23 0956  BP: 137/71   (!) 105/54  Pulse: 67   83  Resp: 18   18  Temp: 98.3 F (36.8 C)   97.9 F (36.6 C)  TempSrc: Oral   Oral  SpO2: 95%  94% 95%  Weight:  90.5 kg    Height:         Intake/Output Summary (Last 24 hours) at 08/11/2023 1040 Last data filed at 08/11/2023 0400 Gross per 24 hour  Intake 780 ml  Output 1800 ml  Net -1020 ml      PHYSICAL EXAM  General: Well developed, well nourished, in no acute distress HEENT:  Normocephalic and atramatic Neck:  No JVD.  Lungs: Clear bilaterally to auscultation and percussion. Heart: Irregular irregular. Normal S1 and S2 without gallops or 2/6 sem murmurs.  Abdomen: Bowel sounds are positive, abdomen soft and non-tender  Msk:  Back normal, normal gait. Normal strength and tone for age. Extremities: No clubbing, cyanosis or edema.   Neuro: Alert and oriented X 3. Psych:  Good affect, responds appropriately   LABS: Basic Metabolic Panel: Recent Labs    08/10/23 0615 08/11/23 0601  NA 136 134*  K 3.3* 3.4*  CL 101 98  CO2 27 30  GLUCOSE 121* 119*  BUN 29* 28*  CREATININE 1.35* 1.41*  CALCIUM  8.4* 8.4*   Liver Function Tests: Recent Labs    08/09/23 0455  AST 22  ALT 15  ALKPHOS 48  BILITOT 1.5*  PROT 5.7*  ALBUMIN 2.9*   No results for input(s): "LIPASE", "AMYLASE" in the last 72 hours. CBC: Recent Labs    08/10/23 0615 08/11/23 0601  WBC 6.6 6.7  NEUTROABS  --  3.9  HGB 10.9* 11.5*  HCT 33.7* 34.4*  MCV 88.2 84.7  PLT 160 199   Cardiac Enzymes: No results for input(s): "CKTOTAL", "CKMB", "CKMBINDEX", "TROPONINI" in the last 72 hours. BNP: Invalid input(s): "POCBNP" D-Dimer: No results for  input(s): "DDIMER" in the last 72 hours. Hemoglobin A1C: No results for input(s): "HGBA1C" in the last 72 hours. Fasting Lipid Panel: No results for input(s): "CHOL", "HDL", "LDLCALC", "TRIG", "CHOLHDL", "LDLDIRECT" in the last 72 hours. Thyroid Function Tests: No results for input(s): "TSH", "T4TOTAL", "T3FREE", "THYROIDAB" in the last 72 hours.  Invalid input(s): "FREET3" Anemia Panel: No results for input(s): "VITAMINB12", "FOLATE", "FERRITIN", "TIBC", "IRON", "RETICCTPCT" in the last 72 hours.  ECHOCARDIOGRAM COMPLETE Result Date: 08/10/2023    ECHOCARDIOGRAM REPORT   Patient Name:   Dennis Savage Date of Exam: 08/10/2023 Medical Rec #:  [id number]      Height:       69.0 in Accession #:    [id number]     Weight:       203.7 lb Date of Birth:  1929-10-27      BSA:          2.082 m Patient Age:    89 years       BP:           128/64 mmHg Patient Gender: M              HR:           68  bpm. Exam Location:  Inpatient Procedure: 2D Echo (Both Spectral and Color Flow Doppler were utilized during            procedure). Indications:     congestive heart failure  History:         Patient has prior history of Echocardiogram examinations, most                  recent 06/18/2021. Chronic kidney disease and COPD,                  Arrythmias:Atrial Fibrillation, Signs/Symptoms:Shortness of                  Breath; Risk Factors:Former Smoker and Hypertension.  Sonographer:     Dione Franks RDCS Referring Phys:  [id number] CARALYN HUDSON Diagnosing Phys: Constancia Delton MD IMPRESSIONS  1. Left ventricular ejection fraction, by estimation, is 50 to 55%. The left ventricle has low normal function. The left ventricle has no regional wall motion abnormalities. There is moderate concentric left ventricular hypertrophy. Left ventricular diastolic parameters are indeterminate.  2. Right ventricular systolic function is normal. The right ventricular size is normal. There is normal pulmonary artery systolic pressure.  3.  Left atrial size was severely dilated.  4. Right atrial size was severely dilated.  5. The mitral valve is normal in structure. Mild mitral valve regurgitation.  6. The aortic valve is tricuspid. Aortic valve regurgitation is mild. Aortic valve sclerosis/calcification is present, without any evidence of aortic stenosis.  7. Aortic dilatation noted. There is mild dilatation of the aortic root, measuring 40 mm.  8. The inferior vena cava is normal in size with greater than 50% respiratory variability, suggesting right atrial pressure of 3 mmHg. FINDINGS  Left Ventricle: Left ventricular ejection fraction, by estimation, is 50 to 55%. The left ventricle has low normal function. The left ventricle has no regional wall motion abnormalities. The left ventricular internal cavity size was normal in size. There is moderate concentric left ventricular hypertrophy. Left ventricular diastolic parameters are indeterminate. Right Ventricle: The right ventricular size is normal. No increase in right ventricular wall thickness. Right ventricular systolic function is normal. There is normal pulmonary artery systolic pressure. The tricuspid regurgitant velocity is 2.81 m/s, and  with an assumed right atrial pressure of 3 mmHg, the estimated right ventricular systolic pressure is 34.6 mmHg. Left Atrium: Left atrial size was severely dilated. Right Atrium: Right atrial size was severely dilated. Pericardium: Trivial pericardial effusion is present. Mitral Valve: The mitral valve is normal in structure. Mild mitral valve regurgitation. Tricuspid Valve: The tricuspid valve is normal in structure. Tricuspid valve regurgitation is mild. Aortic Valve: The aortic valve is tricuspid. Aortic valve regurgitation is mild. Aortic valve sclerosis/calcification is present, without any evidence of aortic stenosis. Pulmonic Valve: The pulmonic valve was normal in structure. Pulmonic valve regurgitation is mild. Aorta: Aortic dilatation noted. There  is mild dilatation of the aortic root, measuring 40 mm. Venous: The inferior vena cava is normal in size with greater than 50% respiratory variability, suggesting right atrial pressure of 3 mmHg. IAS/Shunts: No atrial level shunt detected by color flow Doppler.  LEFT VENTRICLE PLAX 2D LVIDd:         5.30 cm LVIDs:         3.90 cm LV PW:         1.50 cm LV IVS:        1.50 cm LVOT diam:     2.50 cm LV SV:  71 LV SV Index:   34 LVOT Area:     4.91 cm  RIGHT VENTRICLE             IVC RV Basal diam:  3.60 cm     IVC diam: 2.00 cm RV S prime:     11.40 cm/s LEFT ATRIUM              Index        RIGHT ATRIUM           Index LA diam:        6.00 cm  2.88 cm/m   RA Area:     32.80 cm LA Vol (A2C):   139.0 ml 66.76 ml/m  RA Volume:   109.00 ml 52.35 ml/m LA Vol (A4C):   130.0 ml 62.43 ml/m LA Biplane Vol: 139.0 ml 66.76 ml/m  AORTIC VALVE LVOT Vmax:   76.50 cm/s LVOT Vmean:  49.300 cm/s LVOT VTI:    0.144 m  AORTA Ao Root diam: 4.00 cm Ao Asc diam:  3.80 cm TRICUSPID VALVE TR Peak grad:   31.6 mmHg TR Vmax:        281.00 cm/s  SHUNTS Systemic VTI:  0.14 m Systemic Diam: 2.50 cm Constancia Delton MD Electronically signed by Constancia Delton MD Signature Date/Time: 08/10/2023/2:56:08 PM    Final      Echo borderline left ventricular function EF around 50 to 55%  TELEMETRY: Atrial fibrillation rate of 70 nonspecific ST-T changes:  ASSESSMENT AND PLAN:  Principal Problem:   Acute on chronic diastolic CHF (congestive heart failure) (HCC) Active Problems:   COPD (chronic obstructive pulmonary disease) (HCC)   Benign hypertension   Essential hypertension   Paroxysmal atrial fibrillation (HCC)   GERD without esophagitis   Acute respiratory failure with hypoxia (HCC)   CKD stage 3a, GFR 45-59 ml/min (HCC)   NSTEMI (non-ST elevated myocardial infarction) (HCC)   Pressure injury of skin    Plan Shortness of breath persistent but somewhat improved no leg edema Possible Non-STEMI more likely demand  ischemia by history elevated troponins denies any chest pain continue conservative management Acute on chronic hypoxic respiratory failure improving on supplemental oxygen as well as diuretics improved dyspnea continue current therapy Atrial fibrillation acute on chronic on Xarelto  for anticoagulation continue rate control management currently A-fib 77 COPD continue inhalers as necessary consider pulmonary input Elevated troponins moderately probably demand ischemia recommend conservative management do not recommend invasive strategy at this point Chronic renal sufficiency stage IIIa continue hydration avoid nephrotoxic drugs   Antonette Batters, MD, 08/11/2023 10:40 AM

## 2023-08-11 NOTE — Evaluation (Signed)
 Physical Therapy Evaluation Patient Details Name: Dennis Savage MRN: [id number] DOB: 07-Feb-1930 Today's Date: 08/11/2023  History of Present Illness  89 y.o. male  with past medical history of proximal A-fib (Xarelto ), HFpEF, COPD, prostate cancer, CKD (stage III), HTN, GERD, and subdural hygroma admitted on 08/08/2023 with shortness of breath.  Patient had recent admission at [facility] for the same (March 2025). Patient is being treated for acute on chronic HFrEF, acute respiratory failure with volume overload, NSTEMI, and COPD.   Clinical Impression  Pt admitted with above diagnosis. Pt currently with functional limitations due to the deficits listed below (see PT Problem List). Pt received upright in bed with family present in room. PTA to living with family and is mod-I using RW and 4ww at baseline. Family assists with IADL's.   To date, pt is weak relying on bed features, increase time, and modA for bed mobility. Is improved from OT eval standing at CGA to RW from low bed surface and is able to slowly SPT using RW to recliner but is slow and laborious needing min VC's for RW sequencing. However prior to transfer pt with significant posterior bias and weight shift onto heels needing minA and multimodal cuing for posture prior to SPT. Also with posterior LOB x1 during transfer needing maxA+1 to correct. Upon sitting no notable SOB or DOE with transfer with all needs in reach. Pt will benefit from skilled PT services < 3 hours/day to address acute deficits to return to PLOF.      If plan is discharge home, recommend the following: A little help with bathing/dressing/bathroom;Assistance with cooking/housework;Assist for transportation;Help with stairs or ramp for entrance;A lot of help with walking and/or transfers   Can travel by private vehicle   No    Equipment Recommendations Other (comment) (TBD by next venue of care)  Recommendations for Other Services       Functional Status Assessment  Patient has had a recent decline in their functional status and demonstrates the ability to make significant improvements in function in a reasonable and predictable amount of time.     Precautions / Restrictions Precautions Precautions: Fall Recall of Precautions/Restrictions: Intact Precaution/Restrictions Comments: monitor o2 Restrictions Weight Bearing Restrictions Per Provider Order: No      Mobility  Bed Mobility Overal bed mobility: Needs Assistance Bed Mobility: Supine to Sit     Supine to sit: HOB elevated, Used rails, Mod assist     General bed mobility comments: ModA at torso Patient Response: Cooperative  Transfers Overall transfer level: Needs assistance Equipment used: Rolling walker (2 wheels) Transfers: Sit to/from Stand, Bed to chair/wheelchair/BSC Sit to Stand: Contact guard assist   Step pivot transfers: Contact guard assist       General transfer comment: stands from bed lowered without phsical assist. Does have posterior leaning needing multimodal cuing to correct prior to transfer to recliner.    Ambulation/Gait                  Stairs            Wheelchair Mobility     Tilt Bed Tilt Bed Patient Response: Cooperative  Modified Rankin (Stroke Patients Only)       Balance Overall balance assessment: Needs assistance Sitting-balance support: No upper extremity supported, Feet supported Sitting balance-Leahy Scale: Fair   Postural control: Posterior lean Standing balance support: During functional activity, Reliant on assistive device for balance, Bilateral upper extremity supported Standing balance-Leahy Scale: Poor Standing balance comment: multi modal  cuing for posterior lean                             Pertinent Vitals/Pain Pain Assessment Pain Assessment: Faces Faces Pain Scale: Hurts a little bit Pain Location: back Pain Descriptors / Indicators: Discomfort Pain Intervention(s): Limited activity  within patient's tolerance, Monitored during session, Repositioned    Home Living Family/patient expects to be discharged to:: Private residence Living Arrangements: Children Available Help at Discharge: Available PRN/intermittently Type of Home: House Home Access: Stairs to enter   [employer] of Steps: 1   Home Layout: One level Home Equipment: Rollator (4 wheels);Cane - single point;Wheelchair - manual;Grab bars - tub/shower;Shower seat;Tub bench;Toilet riser      Prior Function Prior Level of Function : Needs assist             Mobility Comments: RW or 4WW ADLs Comments: MOD I-I in ADL, assist with IADLs- driving, shopping, cooking     Extremity/Trunk Assessment   Upper Extremity Assessment Upper Extremity Assessment: Generalized weakness    Lower Extremity Assessment Lower Extremity Assessment: Generalized weakness    Cervical / Trunk Assessment Cervical / Trunk Assessment: Normal  Communication   Communication Communication: Impaired Factors Affecting Communication: Hearing impaired    Cognition Arousal: Alert Behavior During Therapy: WFL for tasks assessed/performed   PT - Cognitive impairments: No apparent impairments                         Following commands: Intact       Cueing Cueing Techniques: Verbal cues     General Comments      Exercises Other Exercises Other Exercises: importance of OOB mobility for function and cardiopulmonary function.   Assessment/Plan    PT Assessment Patient needs continued PT services  PT Problem List Decreased strength;Decreased mobility;Decreased activity tolerance;Cardiopulmonary status limiting activity;Decreased balance       PT Treatment Interventions DME instruction;Therapeutic exercise;Gait training;Balance training;Stair training;Neuromuscular re-education;Functional mobility training;Therapeutic activities;Patient/family education    PT Goals (Current goals can be found in  the Care Plan section)  Acute Rehab PT Goals Patient Stated Goal: to improve his mobility PT Goal Formulation: With patient/family Time For Goal Achievement: 08/25/23 Potential to Achieve Goals: Good    Frequency Min 3X/week     Co-evaluation               AM-PAC PT "6 Clicks" Mobility  Outcome Measure Help needed turning from your back to your side while in a flat bed without using bedrails?: A Lot Help needed moving from lying on your back to sitting on the side of a flat bed without using bedrails?: A Lot Help needed moving to and from a bed to a chair (including a wheelchair)?: A Lot Help needed standing up from a chair using your arms (e.g., wheelchair or bedside chair)?: A Little Help needed to walk in hospital room?: A Lot Help needed climbing 3-5 steps with a railing? : A Lot 6 Click Score: 13    End of Session Equipment Utilized During Treatment: Gait belt;Oxygen Activity Tolerance: Patient limited by fatigue Patient left: in chair;with call bell/phone within reach;with chair alarm set Nurse Communication: Mobility status PT Visit Diagnosis: Unsteadiness on feet (R26.81);Other abnormalities of gait and mobility (R26.89);Muscle weakness (generalized) (M62.81);Difficulty in walking, not elsewhere classified (R26.2)    Time: 1610-9604 PT Time Calculation (min) (ACUTE ONLY): 19 min   Charges:   PT Evaluation $  PT Eval Moderate Complexity: 1 Mod   PT General Charges $$ ACUTE PT VISIT: 1 Visit        Marc Senior. Fairly IV, PT, DPT Physical Therapist- [place]  [facility]  08/11/2023, 10:42 AM

## 2023-08-12 DIAGNOSIS — I4891 Unspecified atrial fibrillation: Secondary | ICD-10-CM | POA: Diagnosis not present

## 2023-08-12 DIAGNOSIS — I5033 Acute on chronic diastolic (congestive) heart failure: Secondary | ICD-10-CM | POA: Diagnosis not present

## 2023-08-12 DIAGNOSIS — N1831 Chronic kidney disease, stage 3a: Secondary | ICD-10-CM

## 2023-08-12 DIAGNOSIS — I10 Essential (primary) hypertension: Secondary | ICD-10-CM

## 2023-08-12 DIAGNOSIS — J9601 Acute respiratory failure with hypoxia: Secondary | ICD-10-CM | POA: Diagnosis not present

## 2023-08-12 DIAGNOSIS — Z515 Encounter for palliative care: Secondary | ICD-10-CM | POA: Diagnosis not present

## 2023-08-12 DIAGNOSIS — Z66 Do not resuscitate: Secondary | ICD-10-CM | POA: Diagnosis not present

## 2023-08-12 LAB — BASIC METABOLIC PANEL WITH GFR
Anion gap: 8 (ref 5–15)
BUN: 34 mg/dL — ABNORMAL HIGH (ref 8–23)
CO2: 30 mmol/L (ref 22–32)
Calcium: 8.5 mg/dL — ABNORMAL LOW (ref 8.9–10.3)
Chloride: 98 mmol/L (ref 98–111)
Creatinine, Ser: 1.36 mg/dL — ABNORMAL HIGH (ref 0.61–1.24)
GFR, Estimated: 49 mL/min — ABNORMAL LOW (ref >60)
Glucose, Bld: 123 mg/dL — ABNORMAL HIGH (ref 70–99)
Potassium: 3.6 mmol/L (ref 3.5–5.1)
Sodium: 136 mmol/L (ref 135–145)

## 2023-08-12 LAB — CBC WITH DIFFERENTIAL/PLATELET
Abs Immature Granulocytes: 0.04 10*3/uL (ref 0.00–0.07)
Basophils Absolute: 0 10*3/uL (ref 0.0–0.1)
Basophils Relative: 0 %
Eosinophils Absolute: 0.1 10*3/uL (ref 0.0–0.5)
Eosinophils Relative: 1 %
HCT: 34 % — ABNORMAL LOW (ref 39.0–52.0)
Hemoglobin: 11.3 g/dL — ABNORMAL LOW (ref 13.0–17.0)
Immature Granulocytes: 1 %
Lymphocytes Relative: 23 %
Lymphs Abs: 1.6 10*3/uL (ref 0.7–4.0)
MCH: 28.1 pg (ref 26.0–34.0)
MCHC: 33.2 g/dL (ref 30.0–36.0)
MCV: 84.6 fL (ref 80.0–100.0)
Monocytes Absolute: 1.3 10*3/uL — ABNORMAL HIGH (ref 0.1–1.0)
Monocytes Relative: 19 %
Neutro Abs: 3.8 10*3/uL (ref 1.7–7.7)
Neutrophils Relative %: 56 %
Platelets: 211 10*3/uL (ref 150–400)
RBC: 4.02 MIL/uL — ABNORMAL LOW (ref 4.22–5.81)
RDW: 16.4 % — ABNORMAL HIGH (ref 11.5–15.5)
WBC: 6.7 10*3/uL (ref 4.0–10.5)
nRBC: 0 % (ref 0.0–0.2)

## 2023-08-12 LAB — TROPONIN I (HIGH SENSITIVITY): Troponin I (High Sensitivity): 129 ng/L (ref <18)

## 2023-08-12 MED ORDER — NITROGLYCERIN 0.4 MG SL SUBL
0.4000 mg | SUBLINGUAL_TABLET | SUBLINGUAL | Status: AC | PRN
  Administered 2023-08-16 (×3): 0.4 mg via SUBLINGUAL
  Filled 2023-08-12 (×2): qty 1

## 2023-08-12 MED ORDER — LOSARTAN POTASSIUM 25 MG PO TABS
12.5000 mg | ORAL_TABLET | Freq: Every day | ORAL | Status: DC
  Administered 2023-08-12 – 2023-08-14 (×3): 12.5 mg via ORAL
  Filled 2023-08-12 (×3): qty 1

## 2023-08-12 MED ORDER — ORAL CARE MOUTH RINSE: 15.0000 mL | OROMUCOSAL | Status: DC | PRN

## 2023-08-12 NOTE — Plan of Care (Signed)

## 2023-08-12 NOTE — Progress Notes (Signed)
 Physical Therapy Treatment Patient Details Name: Dennis Savage MRN: [id number] DOB: 1929/08/30 Today's Date: 08/12/2023   History of Present Illness 89 y.o. male  with past medical history of proximal A-fib (Xarelto ), HFpEF, COPD, prostate cancer, CKD (stage III), HTN, GERD, and subdural hygroma admitted on 08/08/2023 with shortness of breath.  Patient had recent admission at [facility] for the same (March 2025). Patient is being treated for acute on chronic HFrEF, acute respiratory failure with volume overload, NSTEMI, and COPD.    PT Comments  Pt received upright in bed agreeable to PT services. Reports breathing improved. Able to titrate to 2 L/min from 4 L/min with SPO2 >95% at rest and with activity. Bed mobility modA with bed features, CGA for STS and gait to bathroom. Pt unable to pass BM needing railing and CGA to stand to RW and able to perform pericare but definite need for support from PT due to weakness/imbalance. Pt able to ambulate from bathroom to recliner with mild SOB and poor gait quality with limited step lengths and poor foot clearance needing heavy UE support on RW. Pt is making progress towards goals however. Pt in recliner with all needs in reach. RN notified of titration of O2. D/c recs remain appropriate.    If plan is discharge home, recommend the following: A little help with bathing/dressing/bathroom;Assistance with cooking/housework;Assist for transportation;Help with stairs or ramp for entrance;A lot of help with walking and/or transfers   Can travel by private vehicle     Yes  Equipment Recommendations  Other (comment) (TBD)    Recommendations for Other Services       Precautions / Restrictions Precautions Precautions: Fall Recall of Precautions/Restrictions: Intact Precaution/Restrictions Comments: monitor o2 Restrictions Weight Bearing Restrictions Per Provider Order: No     Mobility  Bed Mobility Overal bed mobility: Needs Assistance Bed Mobility: Supine to  Sit     Supine to sit: HOB elevated, Used rails, Mod assist       Patient Response: Cooperative  Transfers Overall transfer level: Needs assistance Equipment used: Rolling walker (2 wheels) Transfers: Sit to/from Stand Sit to Stand: Contact guard assist                Ambulation/Gait Ambulation/Gait assistance: Contact guard assist Gait Distance (Feet): 45 Feet Assistive device: Rolling walker (2 wheels) Gait Pattern/deviations: Step-through pattern, Decreased step length - right, Decreased step length - left, Narrow base of support       General Gait Details: ambulated from EOB to bathroom then bathroom to recliner.   Stairs             Wheelchair Mobility     Tilt Bed Tilt Bed Patient Response: Cooperative  Modified Rankin (Stroke Patients Only)       Balance Overall balance assessment: Needs assistance Sitting-balance support: No upper extremity supported, Feet supported Sitting balance-Leahy Scale: Fair     Standing balance support: During functional activity, Reliant on assistive device for balance, Bilateral upper extremity supported Standing balance-Leahy Scale: Fair Standing balance comment: improved static standing tolerance.                            Communication Communication Communication: Impaired Factors Affecting Communication: Hearing impaired  Cognition Arousal: Alert Behavior During Therapy: WFL for tasks assessed/performed   PT - Cognitive impairments: No apparent impairments  Following commands: Intact      Cueing Cueing Techniques: Verbal cues  Exercises      General Comments General comments (skin integrity, edema, etc.): SPO2 > 95% post gait titrated to 2 L/min      Pertinent Vitals/Pain Pain Assessment Pain Assessment: Faces Faces Pain Scale: Hurts a little bit Pain Location: back Pain Descriptors / Indicators: Discomfort Pain Intervention(s): Limited  activity within patient's tolerance, Monitored during session, Repositioned    Home Living                          Prior Function            PT Goals (current goals can now be found in the care plan section) Acute Rehab PT Goals Patient Stated Goal: to improve his mobility PT Goal Formulation: With patient/family Time For Goal Achievement: 08/25/23 Potential to Achieve Goals: Good Progress towards PT goals: Progressing toward goals    Frequency    Min 3X/week      PT Plan      Co-evaluation              AM-PAC PT "6 Clicks" Mobility   Outcome Measure  Help needed turning from your back to your side while in a flat bed without using bedrails?: A Lot Help needed moving from lying on your back to sitting on the side of a flat bed without using bedrails?: A Lot Help needed moving to and from a bed to a chair (including a wheelchair)?: A Little Help needed standing up from a chair using your arms (e.g., wheelchair or bedside chair)?: A Little Help needed to walk in hospital room?: A Little Help needed climbing 3-5 steps with a railing? : A Lot 6 Click Score: 15    End of Session Equipment Utilized During Treatment: Gait belt;Oxygen Activity Tolerance: Patient tolerated treatment well Patient left: in bed;with call bell/phone within reach;with bed alarm set Nurse Communication: Mobility status PT Visit Diagnosis: Unsteadiness on feet (R26.81);Other abnormalities of gait and mobility (R26.89);Muscle weakness (generalized) (M62.81);Difficulty in walking, not elsewhere classified (R26.2)     Time: 1610-9604 PT Time Calculation (min) (ACUTE ONLY): 26 min  Charges:    $Therapeutic Activity: 23-37 mins PT General Charges $$ ACUTE PT VISIT: 1 Visit                    Marc Senior. Fairly IV, PT, DPT Physical Therapist- [place]  [facility]  08/12/2023, 12:11 PM

## 2023-08-12 NOTE — Progress Notes (Signed)
 Progress Note   Patient: Dennis Savage NFA:[id number] DOB: 04-28-1929 DOA: 08/08/2023     4 DOS: the patient was seen and examined on 08/12/2023   Brief hospital course: 89yo with h/o afib on Xarelto , HFpEF, COPD, prostate CA (on palliative therapy), stage 3a CKD, HTN, and subdural hygroma who presented on 4/24 with respiratory distress. He was noted to have acute on chronic diastolic CHF and was admitted with cardiology consultation. He was diuresed with IV Lasix  with improvement and spironolactone  has been added.   Assessment and Plan:  Acute on chronic HFpEF Decompensated heart failure requiring BiPAP in the setting of volume overload and acute on chronic HFpEF Able to transition off BIPAP to [place] O2 (2L as of this AM) 2D ECHO 06/2023 in [facility] system with EF 55-60%, severe biatrial enlargement  BNP 1450 Noted pleural effusions on chest x-ray Noted baseline COPD- does not appear to be in exacerbation at present  IV Lasix  BID Recurrent issue per the family Prn duonebs Consulting cardiology   NSTEMI (non-ST elevated myocardial infarction)  Troponin 400 presentation No active chest pain Suspect demand ischemia in setting of decompensated respiratory failure and volume overload on BiPAP Continue home Xarelto  for now   COPD (chronic obstructive pulmonary disease)  Noted baseline COPD Concurrent decompensated respiratory failure requiring BiPAP appears to be from cardiac origin rather than pulmonary Does not appear to be in acute exacerbation at present Continue home regiment Otherwise monitor   Essential hypertension BP stable  Hold amlodipine  in the setting of volume overload   Paroxysmal atrial fibrillation Rate controlled at present without medication Continue Xarelto  - it appears that he has not been taking this but review of prior visits indicates that it has not been stopped by a provider   GERD without esophagitis Continue omeprazole   CKD stage 3a, GFR 45-59 ml/min   Cr 1.37 w/ GFR in the 40s  Appears to be near baseline  Monitor w/ diuresis    Pressure injury  Pressure Injury 08/09/23 Coccyx Stage 1 -  Intact skin with non-blanchable redness of a localized area usually over a bony prominence. Redness (Active)  08/09/23 0420  Location: Coccyx  Location Orientation:   Staging: Stage 1 -  Intact skin with non-blanchable redness of a localized area usually over a bony prominence.  Wound Description (Comments): Redness  Present on Admission:     Prostate CA On Orgovyx  for palliation   DNR I have discussed code status with the patient/family and they are in agreement that the patient would not desire resuscitation and would prefer to die a natural death should that situation arise. Patient will need a gold out of facility DNR form at the time of discharge    Deconditioning PT/OT consulting Recommending SNF rehab ([facility])         Consultants: Cardiology Palliative care PT OT [facility] team   Procedures: Echocardiogram   Antibiotics: None    30 Day Unplanned Readmission Risk Score    Flowsheet Row ED to Hosp-Admission (Current) from 08/08/2023 in [facility] PCU  30 Day Unplanned Readmission Risk Score (%) 38 Filed at 08/12/2023 0801       This score is the patient's risk of an unplanned readmission within 30 days of being discharged (0 -100%). The score is based on dignosis, age, lab data, medications, orders, and past utilization.   Low:  0-14.9   Medium: 15-21.9   High: 22-29.9   Extreme: 30 and above  Subjective: Feeling much better this AM.  Sitting up, pleasant, down to 2L [place] O2.  RN just reported, however, that patient developed 4/10 stabbing CP so getting EKG, troponin, giving NTG.   Objective: Vitals:   08/12/23 0820 08/12/23 0824  BP: 126/66   Pulse: 75   Resp: (!) 21   Temp: 98.4 F (36.9 C)   SpO2: 98% 97%    Intake/Output Summary (Last 24 hours) at 08/12/2023 1426 Last  data filed at 08/12/2023 1259 Gross per 24 hour  Intake 840 ml  Output 3000 ml  Net -2160 ml   Filed Weights   08/10/23 0411 08/11/23 0444 08/12/23 0500  Weight: 92.4 kg 90.5 kg 95 kg    Exam:  General:  Appears calm and comfortable and is in NAD, on  O2, looking much healthier this AM Eyes:   EOMI, normal iris ENT:  hard of hearing, grossly normal lips & tongue, mmm Cardiovascular:  RRR. Trace LE edema, improved.  Respiratory:   CTAB.  Normal respiratory effort. Abdomen:  soft, NT, ND Skin:  no rash or induration seen on limited exam Musculoskeletal:  generalized weakness, no bony abnormality Psychiatric:  blunted mood and affect, speech fluent and appropriate, AOx3 Neurologic:  CN 2-12 grossly intact, moves all extremities in coordinated fashion  Data Reviewed: I have reviewed the patient's lab results since admission.  Pertinent labs for today include:  Glucose 123 BUN 34/Creatinine 1.36/GFR 49, stable WBC 6.7 Hgb 11.3, stable     Family Communication: None present; I left a message for his daughter by telephone  Disposition: Status is: Inpatient Remains inpatient appropriate because: ongoing management     Time spent: 50 minutes  Unresulted Labs (From admission, onward)     Start     Ordered   08/13/23 0500  Basic metabolic panel with GFR  Tomorrow morning,   R        08/12/23 0913   08/13/23 0500  CBC  Tomorrow morning,   R        08/12/23 0913             Author: Lorita Rosa, MD 08/12/2023 2:26 PM  For on call review [email or web address].

## 2023-08-12 NOTE — Care Management Important Message (Signed)
 Important Message  Patient Details  Name: Dennis Savage MRN: [id number] Date of Birth: [birth year]   Important Message Given:  Yes - Medicare IM     Anise Kerns 08/12/2023, 12:14 PM

## 2023-08-12 NOTE — TOC Progression Note (Signed)
 Transition of Care [facility]) - Progression Note    Patient Details  Name: Dennis Savage MRN: [id number] Date of Birth: 1929/06/09  [facility] CM/SW Contact  Baird Bombard, RN Phone Number: 08/12/2023, 11:54 AM  Clinical Narrative:    Attempt to reach patient's daughter, [name]. No answer. Left a message .  3:30pm Spoke with patient's daughter [name]. She stated patient was previously at a SNF and had used some of his medicare days. She would like patient to admit to a facility using [facility] benefits if available.          Expected Discharge Plan and Services                                               Social Determinants of Health (SDOH) Interventions SDOH Screenings   Food Insecurity: No Food Insecurity (08/10/2023)  Housing: Low Risk  (08/10/2023)  Transportation Needs: No Transportation Needs (08/10/2023)  Utilities: Not At Risk (08/10/2023)  Depression (PHQ2-9): Low Risk  (06/30/2021)  Financial Resource Strain: Low Risk  (06/07/2023)   Received from [facility]  Social Connections: Moderately Isolated (08/10/2023)  Tobacco Use: Medium Risk (08/08/2023)    Readmission Risk Interventions    04/18/2023    1:48 PM  Readmission Risk Prevention Plan  Transportation Screening Complete  Medication Review (RN Care Manager) Complete  HRI or Home Care Consult Complete  Palliative Care Screening Not Applicable  Skilled Nursing Facility Complete

## 2023-08-12 NOTE — Progress Notes (Signed)
 [facility] CLINIC CARDIOLOGY PROGRESS NOTE       Patient ID: Dennis Savage MRN: [id number] DOB/AGE: 07-31-1929 89 y.o.  Admit date: 08/08/2023 Referring Physician Dr. Pablo Boards Primary Physician Little Riff, MD  Primary Cardiologist Bary Likes (last seen in 2023) Reason for Consultation AoC HFpEF  HPI: Dennis Savage is a 89 y.o. male  with a past medical history of chronic HFpEF, chronic atrial fibrillation on Xarelto , COPD who presented to the ED on 08/08/2023 for worsening SOB and LE edema. Cardiology was consulted for further evaluation.   Interval history: -Patient seen and examined this AM, working with PT.  -Feels SOB is improving, remains on supplemental O2.  -Denies CP or palpitations.   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   Dizziness 02/25/2015   GERD (gastroesophageal reflux disease)    Heart murmur    Hypertension    Interstitial lung disease (HCC) 10/13/2013   LBP (low back pain) 11/03/2010   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   Peripheral vascular disease (HCC) 11/03/2010   Retinal hemorrhage 03/16/2014    Past Surgical History:  Procedure Laterality Date   APPENDECTOMY     BACK SURGERY     CARPAL TUNNEL RELEASE Bilateral    EYE SURGERY Bilateral    Cataract Extraction with IOL   HERNIA REPAIR     Umbilical Hernia X 3   JOINT REPLACEMENT     bilat knees   NASAL SINUS SURGERY     REPLACEMENT TOTAL KNEE Bilateral     SHOULDER SURGERY Right    TONSILLECTOMY     TOTAL HIP ARTHROPLASTY Right 10/12/2015   Procedure: TOTAL HIP ARTHROPLASTY;  Surgeon: Arlyne Lame, MD;  Location: [facility] ORS;  Service: Orthopedics;  Laterality: Right;   TOTAL HIP ARTHROPLASTY Left 02/15/2023   Procedure: TOTAL HIP ARTHROPLASTY;  Surgeon: Arlyne Lame, MD;  Location: [facility] ORS;  Service: Orthopedics;  Laterality: Left;    Medications Prior to Admission  Medication Sig Dispense Refill Last Dose/Taking   acetaminophen  (TYLENOL ) 325 MG tablet Take 650 mg by mouth every 6 (six) hours as needed.   Taking As Needed   albuterol  (VENTOLIN  HFA) 108 (90 Base) MCG/ACT inhaler Inhale 2 puffs into the lungs every 6 (six) hours as needed for wheezing or shortness of breath.   Taking As Needed   amLODipine  (NORVASC ) 10 MG tablet Take 10 mg by mouth daily.   08/07/2023   Calcium  Carb-Cholecalciferol  (CALCIUM  600 + D PO) Take 1 tablet by mouth daily.   08/07/2023   cholecalciferol  (VITAMIN D3) 25 MCG (1000 UNIT) tablet Take 1,000 Units by mouth daily.   08/07/2023   docusate sodium  (COLACE) 100 MG capsule Take 100 mg by mouth 2 (two) times daily.   08/07/2023   fluticasone  (FLONASE ) 50 MCG/ACT nasal spray Place 1 spray into both nostrils 2 (two) times daily.   08/07/2023   guaiFENesin  (MUCINEX ) 600 MG 12 hr tablet Take 600 mg by mouth 2 (two)  times daily.   Taking   hydrOXYzine (ATARAX) 10 MG tablet Take 10 mg by mouth at bedtime as needed for itching.   Taking As Needed   Lactulose  20 GM/30ML SOLN Take 30 mLs (20 g total) by mouth daily as needed. 450 mL 0 Taking As Needed   Multiple Vitamins-Minerals (PRESERVISION AREDS 2 PO) Take 1 capsule by mouth 2 (two) times daily.   08/07/2023   Omega-3 Fatty Acids (FISH OIL) 300 MG CAPS Take 300 mg by mouth daily.   08/07/2023   omeprazole (PRILOSEC) 20 MG capsule Take 20 mg by mouth daily.   Past Week   polyethylene glycol (MIRALAX  / GLYCOLAX ) 17 g packet Take 17 g by mouth daily. 14 each 0 Taking   potassium  chloride (KLOR-CON ) 10 MEQ tablet Take by mouth.   Past Week   relugolix  (ORGOVYX ) 120 MG tablet Take 1 tablet (120 mg total) by mouth daily. 30 tablet 11 08/07/2023   rivaroxaban  (XARELTO ) 20 MG TABS tablet Take 20 mg by mouth daily with supper. (Patient not taking: Reported on 08/08/2023)   Not Taking   Social History   Socioeconomic History   Marital status: Single    Spouse name: Not on file   Number of children: Not on file   Years of education: Not on file   Highest education level: Not on file  Occupational History   Not on file  Tobacco Use   Smoking status: Former    Current packs/day: 1.00    Types: Cigarettes   Smokeless tobacco: Never  Vaping Use   Vaping status: Never Used  Substance and Sexual Activity   Alcohol use: No   Drug use: No   Sexual activity: Not on file  Other Topics Concern   Not on file  Social History Narrative   Not on file   Social Drivers of Health   Financial Resource Strain: Low Risk  (06/07/2023)   [facility]   Overall Financial Resource Strain (CARDIA)    Difficulty of Paying Living Expenses: Not hard at all  Food Insecurity: No Food Insecurity (08/10/2023)   Hunger Vital Sign    Worried About Running Out of Food in the Last Year: Never true    Ran Out of Food in the Last Year: Never true  Transportation Needs: No Transportation Needs (08/10/2023)   PRAPARE - Administrator, Civil Service (Medical): No    Lack of Transportation (Non-Medical): No  Physical Activity: Not on file  Stress: Not on file  Social Connections: Moderately Isolated (08/10/2023)   Social Connection and Isolation Panel [NHANES]    Frequency of Communication with Friends and Family: More than three times a week    Frequency of Social Gatherings with Friends and Family: More than three times a week    Attends Religious Services: More than 4 times per year    Active Member of [employer] or Organizations: No    Attends Banker  Meetings: Never    Marital Status: Divorced  Catering manager Violence: Not At Risk (08/10/2023)   Humiliation, Afraid, Rape, and Kick questionnaire    Fear of Current or Ex-Partner: No    Emotionally Abused: No    Physically Abused: No    Sexually Abused: No    Family History  Problem Relation Age of Onset   Bladder Cancer Neg Hx    Kidney cancer Neg Hx    Prostate cancer Neg Hx      Vitals:  08/12/23 0431 08/12/23 0500 08/12/23 0820 08/12/23 0824  BP: 134/71  126/66   Pulse: 69  75   Resp: 18  (!) 21   Temp: 99.1 F (37.3 C)  98.4 F (36.9 C)   TempSrc: Oral     SpO2: 94%  98% 97%  Weight:  95 kg    Height:        PHYSICAL EXAM General: Ill-appearing elderly male, well nourished, in no acute distress. HEENT: Normocephalic and atraumatic. Neck: No JVD.  Lungs: Normal respiratory effort on 4L [place].  Diminished bilaterally Heart: Irregularly irregular, controlled rate. Normal S1 and S2 without gallops or murmurs.  Abdomen: Non-distended appearing.  Msk: Normal strength and tone for age. Extremities: Warm and well perfused. No clubbing, cyanosis.  Trace pitting edema bilaterally.  Neuro: Alert and oriented X 3. Psych: Answers questions appropriately.   Labs: Basic Metabolic Panel: Recent Labs    08/11/23 0601 08/12/23 0407  NA 134* 136  K 3.4* 3.6  CL 98 98  CO2 30 30  GLUCOSE 119* 123*  BUN 28* 34*  CREATININE 1.41* 1.36*  CALCIUM  8.4* 8.5*   Liver Function Tests: No results for input(s): "AST", "ALT", "ALKPHOS", "BILITOT", "PROT", "ALBUMIN" in the last 72 hours.  No results for input(s): "LIPASE", "AMYLASE" in the last 72 hours. CBC: Recent Labs    08/11/23 0601 08/12/23 0407  WBC 6.7 6.7  NEUTROABS 3.9 3.8  HGB 11.5* 11.3*  HCT 34.4* 34.0*  MCV 84.7 84.6  PLT 199 211   Cardiac Enzymes: No results for input(s): "CKTOTAL", "CKMB", "CKMBINDEX", "TROPONINIHS" in the last 72 hours.  BNP: No results for input(s): "BNP" in the last 72  hours.  D-Dimer: No results for input(s): "DDIMER" in the last 72 hours. Hemoglobin A1C: No results for input(s): "HGBA1C" in the last 72 hours. Fasting Lipid Panel: No results for input(s): "CHOL", "HDL", "LDLCALC", "TRIG", "CHOLHDL", "LDLDIRECT" in the last 72 hours. Thyroid Function Tests: No results for input(s): "TSH", "T4TOTAL", "T3FREE", "THYROIDAB" in the last 72 hours.  Invalid input(s): "FREET3" Anemia Panel: No results for input(s): "VITAMINB12", "FOLATE", "FERRITIN", "TIBC", "IRON", "RETICCTPCT" in the last 72 hours.   Radiology: ECHOCARDIOGRAM COMPLETE Result Date: 08/10/2023    ECHOCARDIOGRAM REPORT   Patient Name:   TAVARIS STEFKA Date of Exam: 08/10/2023 Medical Rec #:  [id number]      Height:       69.0 in Accession #:    [id number]     Weight:       203.7 lb Date of Birth:  Jan 10, 1930      BSA:          2.082 m Patient Age:    89 years       BP:           128/64 mmHg Patient Gender: M              HR:           68 bpm. Exam Location:  Inpatient Procedure: 2D Echo (Both Spectral and Color Flow Doppler were utilized during            procedure). Indications:     congestive heart failure  History:         Patient has prior history of Echocardiogram examinations, most                  recent 06/18/2021. Chronic kidney disease and COPD,  Arrythmias:Atrial Fibrillation, Signs/Symptoms:Shortness of                  Breath; Risk Factors:Former Smoker and Hypertension.  Sonographer:     Dione Franks RDCS Referring Phys:  [id number] Tyshana Nishida Diagnosing Phys: Constancia Delton MD IMPRESSIONS  1. Left ventricular ejection fraction, by estimation, is 50 to 55%. The left ventricle has low normal function. The left ventricle has no regional wall motion abnormalities. There is moderate concentric left ventricular hypertrophy. Left ventricular diastolic parameters are indeterminate.  2. Right ventricular systolic function is normal. The right ventricular size is normal. There is  normal pulmonary artery systolic pressure.  3. Left atrial size was severely dilated.  4. Right atrial size was severely dilated.  5. The mitral valve is normal in structure. Mild mitral valve regurgitation.  6. The aortic valve is tricuspid. Aortic valve regurgitation is mild. Aortic valve sclerosis/calcification is present, without any evidence of aortic stenosis.  7. Aortic dilatation noted. There is mild dilatation of the aortic root, measuring 40 mm.  8. The inferior vena cava is normal in size with greater than 50% respiratory variability, suggesting right atrial pressure of 3 mmHg. FINDINGS  Left Ventricle: Left ventricular ejection fraction, by estimation, is 50 to 55%. The left ventricle has low normal function. The left ventricle has no regional wall motion abnormalities. The left ventricular internal cavity size was normal in size. There is moderate concentric left ventricular hypertrophy. Left ventricular diastolic parameters are indeterminate. Right Ventricle: The right ventricular size is normal. No increase in right ventricular wall thickness. Right ventricular systolic function is normal. There is normal pulmonary artery systolic pressure. The tricuspid regurgitant velocity is 2.81 m/s, and  with an assumed right atrial pressure of 3 mmHg, the estimated right ventricular systolic pressure is 34.6 mmHg. Left Atrium: Left atrial size was severely dilated. Right Atrium: Right atrial size was severely dilated. Pericardium: Trivial pericardial effusion is present. Mitral Valve: The mitral valve is normal in structure. Mild mitral valve regurgitation. Tricuspid Valve: The tricuspid valve is normal in structure. Tricuspid valve regurgitation is mild. Aortic Valve: The aortic valve is tricuspid. Aortic valve regurgitation is mild. Aortic valve sclerosis/calcification is present, without any evidence of aortic stenosis. Pulmonic Valve: The pulmonic valve was normal in structure. Pulmonic valve regurgitation  is mild. Aorta: Aortic dilatation noted. There is mild dilatation of the aortic root, measuring 40 mm. Venous: The inferior vena cava is normal in size with greater than 50% respiratory variability, suggesting right atrial pressure of 3 mmHg. IAS/Shunts: No atrial level shunt detected by color flow Doppler.  LEFT VENTRICLE PLAX 2D LVIDd:         5.30 cm LVIDs:         3.90 cm LV PW:         1.50 cm LV IVS:        1.50 cm LVOT diam:     2.50 cm LV SV:         71 LV SV Index:   34 LVOT Area:     4.91 cm  RIGHT VENTRICLE             IVC RV Basal diam:  3.60 cm     IVC diam: 2.00 cm RV S prime:     11.40 cm/s LEFT ATRIUM              Index        RIGHT ATRIUM  Index LA diam:        6.00 cm  2.88 cm/m   RA Area:     32.80 cm LA Vol (A2C):   139.0 ml 66.76 ml/m  RA Volume:   109.00 ml 52.35 ml/m LA Vol (A4C):   130.0 ml 62.43 ml/m LA Biplane Vol: 139.0 ml 66.76 ml/m  AORTIC VALVE LVOT Vmax:   76.50 cm/s LVOT Vmean:  49.300 cm/s LVOT VTI:    0.144 m  AORTA Ao Root diam: 4.00 cm Ao Asc diam:  3.80 cm TRICUSPID VALVE TR Peak grad:   31.6 mmHg TR Vmax:        281.00 cm/s  SHUNTS Systemic VTI:  0.14 m Systemic Diam: 2.50 cm Constancia Delton MD Electronically signed by Constancia Delton MD Signature Date/Time: 08/10/2023/2:56:08 PM    Final    DG Chest Port 1 View Result Date: 08/08/2023 CLINICAL DATA:  Shortness of breath EXAM: PORTABLE CHEST 1 VIEW COMPARISON:  04/28/2023 FINDINGS: Cardiac shadow is enlarged but stable. Aortic calcifications are again seen. Patchy airspace opacity is noted in the upper lobes bilaterally. Stable calcified granuloma is noted in the left mid lung. Small effusions are seen bilaterally. IMPRESSION: Multifocal infiltrate with small effusions bilaterally. Electronically Signed   By: Violeta Grey M.D.   On: 08/08/2023 09:49    ECHO as above  TELEMETRY reviewed by me 08/12/2023: Atrial fibrillation PVCs rate 80s  EKG reviewed by me: atrial fibrillation PVCs, baseline artifact,  rate 90 bpm  Data reviewed by me 08/12/2023: last 24h vitals tele labs imaging I/O ED provider note, admission H&P, hospitalist progress note  Principal Problem:   Acute on chronic diastolic CHF (congestive heart failure) (HCC) Active Problems:   COPD (chronic obstructive pulmonary disease) (HCC)   Benign hypertension   Essential hypertension   Paroxysmal atrial fibrillation (HCC)   GERD without esophagitis   Acute respiratory failure with hypoxia (HCC)   CKD stage 3a, GFR 45-59 ml/min (HCC)   NSTEMI (non-ST elevated myocardial infarction) (HCC)   Pressure injury of skin    ASSESSMENT AND PLAN:  Dennis Savage is a 89 y.o. male  with a past medical history of chronic HFpEF, chronic atrial fibrillation on Xarelto , COPD who presented to the ED on 08/08/2023 for worsening SOB and LE edema. Cardiology was consulted for further evaluation.   # Acute hypoxic respiratory failure # Acute on chronic HFpEF # Demand ischemia # Chronic atrial fibrillation # Frequent PVCs Patient brought to the ED by EMS due to respiratory distress.  Per chart review family reported the patient had had worsening shortness of breath and lower extremity edema prior to coming to the ED.  BNP elevated at 1400.  Troponins 395 > 483.  Placed on BiPAP by EMS.  Started on IV Lasix  in the ED. Echo this admission with E 50-55%, no rWMAs, moderate LVH, mild MR, mild AR, mild aortic root dilatation 4.0 cm. -Continue IV Lasix  40 mg twice daily.  Patient reportedly has not been taking p.o. Lasix  at home. -Start losartan 12.5 mg daily. Continue spironolactone  12.5 mg daily. Defer BB as HR borderline low at times. -Strict intake/output, monitor renal function and electrolytes closely with diuresis. -Continue Xarelto  20 mg daily.  Patient reportedly had not been taking this at home. -Mildly elevated troponin most consistent with demand/supply mismatch and not ACS.  No plan for further cardiac diagnostics at this time. -Continue to  wean O2 as able.  -Palliative has been consulted by primary team.   This  patient's plan of care was discussed and created with Dr. Parks Bollman and he is in agreement.  Signed: Hamp Levine, PA-C  08/12/2023, 8:50 AM [facility] Cardiology

## 2023-08-12 NOTE — Progress Notes (Signed)
 Occupational Therapy Treatment Patient Details Name: Dennis Savage MRN: [id number] DOB: 1929/04/22 Today's Date: 08/12/2023   History of present illness 89 y.o. male  with past medical history of proximal A-fib (Xarelto ), HFpEF, COPD, prostate cancer, CKD (stage III), HTN, GERD, and subdural hygroma admitted on 08/08/2023 with shortness of breath.  Patient had recent admission at [facility] for the same (March 2025). Patient is being treated for acute on chronic HFrEF, acute respiratory failure with volume overload, NSTEMI, and COPD.   OT comments  Pt. presents with multiple c/o of pain including: 10/10 urinary pain, 4-5/10 headache, and throat pain. Pt. SpO2 95%, HR 79 bpms on 3L O2.  Pt. education was provided about energy conservation, and work simplification strategies for ADLs. Pt. education was provided about pursed lip breathing techniques. Reviewed Pt. home routines, and anticipated ADL/IADL/DME needs with the Pt. Pt. Continues to  benefit from OT services for ADL training, A/E training, UE there. Ex. and Pt./caregiver education about energy conservation, work simplification techniques, home modification, and DME.  OT discharge recommendations remain appropriate.      If plan is discharge home, recommend the following:  A lot of help with walking and/or transfers;A lot of help with bathing/dressing/bathroom;Assistance with cooking/housework;Assist for transportation;Help with stairs or ramp for entrance   Equipment Recommendations       Recommendations for Other Services      Precautions / Restrictions Precautions Precautions: Fall Precaution/Restrictions Comments: monitor o2 Restrictions Weight Bearing Restrictions Per Provider Order: No       Mobility Bed Mobility                    Transfers          Deferred 2/2 multiple c/o pain: 10/10 urinary pain, and 4-5/10 headache, and throat pain.               Balance                                            ADL either performed or assessed with clinical judgement   ADL    Pt. Education was provided about energy conservation techniques for ADLs.                                          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.) Patient Visual Report: No change from baseline     Perception     Praxis     Communication Communication Communication: Impaired Factors Affecting Communication: Hearing impaired   Cognition Arousal: Alert Behavior During Therapy: WFL for tasks assessed/performed                                 Following commands: Intact        Cueing   Cueing Techniques: Verbal cues  Exercises      Shoulder Instructions       General Comments      Pertinent Vitals/ Pain       Pain Assessment Pain Score: 10-Worst pain ever (4-5/10 headache, and throat pain.) Pain Location:  Urinary Pain Descriptors / Indicators:  (Hurting) Pain Intervention(s): Limited activity within patient's tolerance, Monitored during session  Home Living                                          Prior Functioning/Environment              Frequency  Min 2X/week        Progress Toward Goals  OT Goals(current goals can now be found in the care plan section)  Progress towards OT goals: Progressing toward goals  Acute Rehab OT Goals Patient Stated Goal: To go to rehab OT Goal Formulation: With patient Time For Goal Achievement: 08/24/23 Potential to Achieve Goals: Good  Plan      Co-evaluation                 AM-PAC OT "6 Clicks" Daily Activity     Outcome Measure   Help from another person eating meals?: None Help from another person taking care of personal grooming?: None   Help from another person bathing (including washing, rinsing,  drying)?: A Lot Help from another person to put on and taking off regular upper body clothing?: A Little Help from another person to put on and taking off regular lower body clothing?: A Lot 6 Click Score: 15    End of Session    OT Visit Diagnosis: Muscle weakness (generalized) (M62.81);Unsteadiness on feet (R26.81)   Activity Tolerance Patient tolerated treatment well;Patient limited by pain   Patient Left in bed;with call bell/phone within reach;with bed alarm set   Nurse Communication          Time: 1610-9604 OT Time Calculation (min): 15 min  Charges: OT General Charges $OT Visit: 1 Visit OT Treatments $Self Care/Home Management : 8-22 mins  Duey Ghent, MS, OTR/L   Duey Ghent 08/12/2023, 5:30 PM

## 2023-08-12 NOTE — Progress Notes (Signed)
                                                     Palliative Care Progress Note, Assessment & Plan   Patient Name: Dennis Savage       Date: 08/12/2023 DOB: 12-18-1929  Age: 89 y.o. MRN#: [id number] Attending Physician: Lorita Rosa, MD Primary Care Physician: Little Riff, MD Admit Date: 08/08/2023  Subjective: Unable to assess  HPI: 89 y.o. male  with past medical history of proximal A-fib (Xarelto ), HFpEF, COPD, prostate cancer, CKD (stage III), HTN, GERD, and subdural hygroma admitted on 08/08/2023 with shortness of breath.  Patient had recent admission at [facility] for the same (March 2025).   Patient is being treated for acute on chronic HFrEF, acute respiratory failure with volume overload, NSTEMI, and COPD.   PMT was consulted to support patient and family with goals of care discussions.    Summary of counseling/coordination of care: Extensive chart review completed prior to meeting patient including labs, vital signs, imaging, progress notes, orders, and available advanced directive documents from current and previous encounters.   After reviewing the patient's chart I assessed patient at bedside.  Ill-appearing elderly male resting in bed.  Even unlabored respirations.  He does not acknowledge my presence and does not respond to verbal or tactile stimuli.  He is in no distress.  Per conversation yesterday with patient's daughter [name], she and patient would like for him to eventually return home.  [facility] and [facility] are [facility].  TOC is also working with her as well.  Patient will remain on PMT list due to high risk for decline.  PMT to follow peripherally and are available for needs as they arise.  Physical Exam Constitutional:      General: He is not in acute distress.     Appearance: He is ill-appearing.  HENT:     Head: Normocephalic and atraumatic.     Nose:     Comments: O2 via [place] [facility]:     Effort: Pulmonary effort is normal. No respiratory distress.  Skin:    General: Skin is warm and dry.      Recommendations/Plan: Continue DNR/DNI status as previously documented    Continue current supportive interventions Recommend DC to [facility] with [facility] being an option   Total Time 25 minutes   Time spent includes: Detailed review of medical records (labs, imaging, vital signs), medically appropriate exam (mental status, respiratory, cardiac, skin), discussed with treatment team, counseling and educating patient, family and staff, documenting clinical information, medication management and coordination of care.     Ina Manas, Joyice Nodal [facility] Palliative Medicine Team  08/12/2023 8:31 AM  Office [phone]  Pager [phone]

## 2023-08-13 DIAGNOSIS — I5033 Acute on chronic diastolic (congestive) heart failure: Secondary | ICD-10-CM | POA: Diagnosis not present

## 2023-08-13 DIAGNOSIS — J810 Acute pulmonary edema: Secondary | ICD-10-CM | POA: Diagnosis not present

## 2023-08-13 DIAGNOSIS — I480 Paroxysmal atrial fibrillation: Secondary | ICD-10-CM | POA: Diagnosis not present

## 2023-08-13 DIAGNOSIS — J9601 Acute respiratory failure with hypoxia: Secondary | ICD-10-CM | POA: Diagnosis not present

## 2023-08-13 LAB — BASIC METABOLIC PANEL WITH GFR
Anion gap: 7 (ref 5–15)
BUN: 39 mg/dL — ABNORMAL HIGH (ref 8–23)
CO2: 31 mmol/L (ref 22–32)
Calcium: 8.6 mg/dL — ABNORMAL LOW (ref 8.9–10.3)
Chloride: 96 mmol/L — ABNORMAL LOW (ref 98–111)
Creatinine, Ser: 1.44 mg/dL — ABNORMAL HIGH (ref 0.61–1.24)
GFR, Estimated: 46 mL/min — ABNORMAL LOW (ref >60)
Glucose, Bld: 114 mg/dL — ABNORMAL HIGH (ref 70–99)
Potassium: 3.5 mmol/L (ref 3.5–5.1)
Sodium: 134 mmol/L — ABNORMAL LOW (ref 135–145)

## 2023-08-13 LAB — URINALYSIS, COMPLETE (UACMP) WITH MICROSCOPIC
Bilirubin Urine: NEGATIVE
Glucose, UA: NEGATIVE mg/dL
Hgb urine dipstick: NEGATIVE
Ketones, ur: NEGATIVE mg/dL
Leukocytes,Ua: NEGATIVE
Nitrite: NEGATIVE
Protein, ur: 100 mg/dL — AB
Specific Gravity, Urine: 1.011 (ref 1.005–1.030)
pH: 6 (ref 5.0–8.0)

## 2023-08-13 LAB — CBC
HCT: 36.7 % — ABNORMAL LOW (ref 39.0–52.0)
Hemoglobin: 12.1 g/dL — ABNORMAL LOW (ref 13.0–17.0)
MCH: 28.3 pg (ref 26.0–34.0)
MCHC: 33 g/dL (ref 30.0–36.0)
MCV: 85.9 fL (ref 80.0–100.0)
Platelets: 243 10*3/uL (ref 150–400)
RBC: 4.27 MIL/uL (ref 4.22–5.81)
RDW: 16.3 % — ABNORMAL HIGH (ref 11.5–15.5)
WBC: 6.7 10*3/uL (ref 4.0–10.5)
nRBC: 0 % (ref 0.0–0.2)

## 2023-08-13 MED ORDER — POTASSIUM CHLORIDE CRYS ER 20 MEQ PO TBCR
40.0000 meq | EXTENDED_RELEASE_TABLET | Freq: Once | ORAL | Status: AC
  Administered 2023-08-13: 40 meq via ORAL
  Filled 2023-08-13: qty 2

## 2023-08-13 MED ORDER — METOPROLOL SUCCINATE ER 25 MG PO TB24
12.5000 mg | ORAL_TABLET | Freq: Every day | ORAL | Status: DC
  Administered 2023-08-13 – 2023-08-23 (×11): 12.5 mg via ORAL
  Filled 2023-08-13 (×11): qty 1

## 2023-08-13 NOTE — Plan of Care (Signed)

## 2023-08-13 NOTE — Progress Notes (Signed)
 Progress Note   Patient: Dennis Savage ZOX:[id number] DOB: [birth year] DOA: 08/08/2023     5 DOS: the patient was seen and examined on 08/13/2023   Brief hospital course: 89yo with h/o afib on Xarelto , HFpEF, COPD, prostate CA (on palliative therapy), stage 3a CKD, HTN, and subdural hygroma who presented on 4/24 with respiratory distress. He was noted to have acute on chronic diastolic CHF and was admitted with cardiology consultation. He was diuresed with IV Lasix  with improvement and spironolactone  has been added.   Assessment and Plan:  Acute on chronic HFpEF Decompensated heart failure requiring BiPAP in the setting of volume overload and acute on chronic HFpEF Able to transition off BIPAP to Diagonal O2 (2L as of this AM) 2D ECHO 06/2023 in the [facility] system with EF 55-60%, severe biatrial enlargement  BNP 1450 Noted pleural effusions on chest x-ray Noted baseline COPD- does not appear to be in exacerbation at present  IV Lasix  BID, likely able to transition to PO soon Recurrent issue per the family, wasn't taking Lasix  at home PTA Consulting cardiology   NSTEMI (non-ST elevated myocardial infarction)  Troponin 400 presentation No active chest pain Suspect demand ischemia in setting of decompensated respiratory failure and volume overload on BiPAP Continue home Xarelto  for now  Dysuria UA with proteinuria and rare bacteria Low suspicion for infection   COPD (chronic obstructive pulmonary disease)  Noted baseline COPD Concurrent decompensated respiratory failure requiring BiPAP appears to be from cardiac origin rather than pulmonary Does not appear to be in acute exacerbation at present Continue home regimen with prn Duonebs   Essential hypertension BP stable  Hold amlodipine  in the setting of volume overload   Paroxysmal atrial fibrillation Rate controlled at present without medication Continue Xarelto  - it appears that he has not been taking this but review of prior visits  indicates that it has not been stopped by a provider   GERD without esophagitis Continue omeprazole   CKD stage 3a, GFR 45-59 ml/min  Cr 1.37 w/ GFR in the 40s  Appears to be near baseline  Monitor w/ diuresis    Pressure injury  Pressure Injury 08/09/23 Coccyx Stage 1 -  Intact skin with non-blanchable redness of a localized area usually over a bony prominence. Redness (Active)  08/09/23 0420  Location: Coccyx  Location Orientation:   Staging: Stage 1 -  Intact skin with non-blanchable redness of a localized area usually over a bony prominence.  Wound Description (Comments): Redness  Present on Admission:     Prostate CA On Orgovyx  for palliation   DNR I have discussed code status with the patient/family and they are in agreement that the patient would not desire resuscitation and would prefer to die a natural death should that situation arise. Patient will need a gold out of facility DNR form at the time of discharge    Deconditioning PT/OT consulting Recommending SN[facility] ([facility])         Consultants: Cardiology Palliative care PT OT [facility] team   Procedures: Echocardiogram   Antibiotics: None  30 Day Unplanned Readmission Risk Score    Flowsheet Row ED to Hosp-Admission (Current) from 08/08/2023 in [facility]  30 Day Unplanned Readmission Risk Score (%) 39 Filed at 08/13/2023 0801       This score is the patient's risk of an unplanned readmission within 30 days of being discharged (0 -100%). The score is based on dignosis, age, lab data, medications, orders, and past  utilization.   Low:  0-14.9   Medium: 15-21.9   High: 22-29.9   Extreme: 30 and above           Subjective: New dysuria since yesterday but otherwise continues to improve overall.   Objective: Vitals:   08/13/23 1102 08/13/23 1526  BP: 133/65 124/64  Pulse: 75 65  Resp: 20 18  Temp: 97.8 F (36.6 C) 98.9 F (37.2 C)  SpO2: 95% 91%     Intake/Output Summary (Last 24 hours) at 08/13/2023 1722 Last data filed at 08/13/2023 1527 Gross per 24 hour  Intake 720 ml  Output 1950 ml  Net -1230 ml   Filed Weights   08/11/23 0444 08/12/23 0500 08/13/23 0500  Weight: 90.5 kg 95 kg 89.2 kg    Exam:  General:  Appears calm and comfortable and is in NAD, on [place] O2, looking much healthier this AM Eyes:   EOMI, normal iris ENT:  hard of hearing, grossly normal lips & tongue, mmm Cardiovascular:  RRR. Trace LE edema, improved.  Respiratory:   CTAB.  Normal respiratory effort. Abdomen:  soft, NT, ND Skin:  no rash or induration seen on limited exam Musculoskeletal:  generalized weakness, no bony abnormality Psychiatric:  blunted mood and affect, speech fluent and appropriate, AOx3 Neurologic:  CN 2-12 grossly intact, moves all extremities in coordinated fashion  Data Reviewed: I have reviewed the patient's lab results since admission.  Pertinent labs for today include:   Na++ 134, stable, not clinically significant Glucose 114 BUN 39/Creatinine 1.44/GFR 46, stable Stable CBC     Family Communication: None present  Disposition: Status is: Inpatient Remains inpatient appropriate because: ongoing monitoring     Time spent: 35 minutes  Unresulted Labs (From admission, onward)     Start     Ordered   08/14/23 0500  Basic metabolic panel with GFR  Daily,   R      08/13/23 1610             Author: Lorita Rosa, MD 08/13/2023 5:22 PM  For on call review [email or web address].

## 2023-08-13 NOTE — Progress Notes (Signed)
 [facility] CLINIC CARDIOLOGY PROGRESS NOTE       Patient ID: Dennis Savage MRN: [id number] DOB/AGE: [birth year] 88 y.o.  Admit date: 08/08/2023 Referring Physician Dr. Pablo Boards Primary Physician Little Riff, MD  Primary Cardiologist Bary Likes (last seen in 2023) Reason for Consultation AoC HFpEF  HPI: Dennis Savage is a 89 y.o. male  with a past medical history of chronic HFpEF, chronic atrial fibrillation on Xarelto , COPD who presented to the ED on 08/08/2023 for worsening SOB and LE edema. Cardiology was consulted for further evaluation.   Interval history: -Patient seen and examined this AM, reports not feeling great today.  -States he is having burning sensation when he urinates.  -SOB continues to improve. Still on O2.  -Denies CP or palpitations.   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   Dizziness 02/25/2015   GERD (gastroesophageal reflux disease)    Heart murmur    Hypertension    Interstitial lung disease (HCC) 10/13/2013   LBP (low back pain) 11/03/2010   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   Peripheral vascular disease (HCC) 11/03/2010   Retinal hemorrhage 03/16/2014    Past Surgical History:  Procedure Laterality Date   APPENDECTOMY     BACK SURGERY     CARPAL TUNNEL RELEASE Bilateral    EYE SURGERY Bilateral    Cataract Extraction with IOL   HERNIA REPAIR     Umbilical Hernia X 3   JOINT REPLACEMENT     bilat knees    NASAL SINUS SURGERY     REPLACEMENT TOTAL KNEE Bilateral    SHOULDER SURGERY Right    TONSILLECTOMY     TOTAL HIP ARTHROPLASTY Right 10/12/2015   Procedure: TOTAL HIP ARTHROPLASTY;  Surgeon: Arlyne Lame, MD;  Location: [facility] ORS;  Service: Orthopedics;  Laterality: Right;   TOTAL HIP ARTHROPLASTY Left 02/15/2023   Procedure: TOTAL HIP ARTHROPLASTY;  Surgeon: Arlyne Lame, MD;  Location: [facility] ORS;  Service: Orthopedics;  Laterality: Left;    Medications Prior to Admission  Medication Sig Dispense Refill Last Dose/Taking   acetaminophen  (TYLENOL ) 325 MG tablet Take 650 mg by mouth every 6 (six) hours as needed.   Taking As Needed   albuterol  (VENTOLIN  HFA) 108 (90 Base) MCG/ACT inhaler Inhale 2 puffs into the lungs every 6 (six) hours as needed for wheezing or shortness of breath.   Taking As Needed   amLODipine  (NORVASC ) 10 MG tablet Take 10 mg by mouth daily.   08/07/2023   Calcium  Carb-Cholecalciferol  (CALCIUM  600 + D PO) Take 1 tablet by mouth daily.   08/07/2023   cholecalciferol  (VITAMIN D3) 25 MCG (1000 UNIT) tablet Take 1,000 Units by mouth daily.   08/07/2023   docusate sodium  (COLACE) 100 MG capsule Take 100 mg by mouth 2 (two) times daily.   08/07/2023   fluticasone  (FLONASE ) 50 MCG/ACT nasal spray Place 1 spray into both nostrils 2 (two) times daily.   08/07/2023   guaiFENesin  (MUCINEX ) 600  MG 12 hr tablet Take 600 mg by mouth 2 (two) times daily.   Taking   hydrOXYzine (ATARAX) 10 MG tablet Take 10 mg by mouth at bedtime as needed for itching.   Taking As Needed   Lactulose  20 GM/30ML SOLN Take 30 mLs (20 g total) by mouth daily as needed. 450 mL 0 Taking As Needed   Multiple Vitamins-Minerals (PRESERVISION AREDS 2 PO) Take 1 capsule by mouth 2 (two) times daily.   08/07/2023   Omega-3 Fatty Acids (FISH OIL) 300 MG CAPS Take 300 mg by mouth daily.   08/07/2023   omeprazole (PRILOSEC) 20 MG capsule Take 20 mg by mouth daily.   Past Week   polyethylene glycol (MIRALAX  / GLYCOLAX ) 17 g  packet Take 17 g by mouth daily. 14 each 0 Taking   potassium chloride  (KLOR-CON ) 10 MEQ tablet Take by mouth.   Past Week   relugolix  (ORGOVYX ) 120 MG tablet Take 1 tablet (120 mg total) by mouth daily. 30 tablet 11 08/07/2023   rivaroxaban  (XARELTO ) 20 MG TABS tablet Take 20 mg by mouth daily with supper. (Patient not taking: Reported on 08/08/2023)   Not Taking   Social History   Socioeconomic History   Marital status: Single    Spouse name: Not on file   Number of children: Not on file   Years of education: Not on file   Highest education level: Not on file  Occupational History   Not on file  Tobacco Use   Smoking status: Former    Current packs/day: 1.00    Types: Cigarettes   Smokeless tobacco: Never  Vaping Use   Vaping status: Never Used  Substance and Sexual Activity   Alcohol use: No   Drug use: No   Sexual activity: Not on file  Other Topics Concern   Not on file  Social History Narrative   Not on file   Social Drivers of Health   Financial Resource Strain: Low Risk  (06/07/2023)   Received from [facility]   Overall Financial Resource Strain (CARDIA)    Difficulty of Paying Living Expenses: Not hard at all  Food Insecurity: No Food Insecurity (08/10/2023)   Hunger Vital Sign    Worried About Running Out of Food in the Last Year: Never true    Ran Out of Food in the Last Year: Never true  Transportation Needs: No Transportation Needs (08/10/2023)   PRAPARE - Administrator, Civil Service (Medical): No    Lack of Transportation (Non-Medical): No  Physical Activity: Not on file  Stress: Not on file  Social Connections: Moderately Isolated (08/10/2023)   Social Connection and Isolation Panel [NHANES]    Frequency of Communication with Friends and Family: More than three times a week    Frequency of Social Gatherings with Friends and Family: More than three times a week    Attends Religious Services: More than 4 times per year    Active Member of  [employer] or Organizations: No    Attends Banker Meetings: Never    Marital Status: Divorced  Catering manager Violence: Not At Risk (08/10/2023)   Humiliation, Afraid, Rape, and Kick questionnaire    Fear of Current or Ex-Partner: No    Emotionally Abused: No    Physically Abused: No    Sexually Abused: No    Family History  Problem Relation Age of Onset   Bladder Cancer Neg Hx    Kidney cancer Neg Hx  Prostate cancer Neg Hx      Vitals:   08/12/23 2057 08/13/23 0439 08/13/23 0500 08/13/23 0727  BP:  118/60  (!) 101/56  Pulse:  70  60  Resp:  18  (!) 21  Temp:  98.7 F (37.1 C)  98.1 F (36.7 C)  TempSrc:  Oral    SpO2: 96% 97%  95%  Weight:   89.2 kg   Height:        PHYSICAL EXAM General: Ill-appearing elderly male, well nourished, in no acute distress. HEENT: Normocephalic and atraumatic. Neck: No JVD.  Lungs: Normal respiratory effort on 3L [place].  Diminished bilaterally Heart: Irregularly irregular, controlled rate. Normal S1 and S2 without gallops or murmurs.  Abdomen: Non-distended appearing.  Msk: Normal strength and tone for age. Extremities: Warm and well perfused. No clubbing, cyanosis.  Trace pitting edema bilaterally.  Neuro: Alert and oriented X 3. Psych: Answers questions appropriately.   Labs: Basic Metabolic Panel: Recent Labs    08/12/23 0407 08/13/23 0551  NA 136 134*  K 3.6 3.5  CL 98 96*  CO2 30 31  GLUCOSE 123* 114*  BUN 34* 39*  CREATININE 1.36* 1.44*  CALCIUM  8.5* 8.6*   Liver Function Tests: No results for input(s): "AST", "ALT", "ALKPHOS", "BILITOT", "PROT", "ALBUMIN" in the last 72 hours.  No results for input(s): "LIPASE", "AMYLASE" in the last 72 hours. CBC: Recent Labs    08/11/23 0601 08/12/23 0407 08/13/23 0551  WBC 6.7 6.7 6.7  NEUTROABS 3.9 3.8  --   HGB 11.5* 11.3* 12.1*  HCT 34.4* 34.0* 36.7*  MCV 84.7 84.6 85.9  PLT 199 211 243   Cardiac Enzymes: Recent Labs    08/12/23 1446  TROPONINIHS 129*     BNP: No results for input(s): "BNP" in the last 72 hours.  D-Dimer: No results for input(s): "DDIMER" in the last 72 hours. Hemoglobin A1C: No results for input(s): "HGBA1C" in the last 72 hours. Fasting Lipid Panel: No results for input(s): "CHOL", "HDL", "LDLCALC", "TRIG", "CHOLHDL", "LDLDIRECT" in the last 72 hours. Thyroid Function Tests: No results for input(s): "TSH", "T4TOTAL", "T3FREE", "THYROIDAB" in the last 72 hours.  Invalid input(s): "FREET3" Anemia Panel: No results for input(s): "VITAMINB12", "FOLATE", "FERRITIN", "TIBC", "IRON", "RETICCTPCT" in the last 72 hours.   Radiology: ECHOCARDIOGRAM COMPLETE Result Date: 08/10/2023    ECHOCARDIOGRAM REPORT   Patient Name:   Dennis Savage Date of Exam: 08/10/2023 Medical Rec #:  [id number]      Height:       69.0 in Accession #:    [id number]     Weight:       203.7 lb Date of Birth:  1930/03/30      BSA:          2.082 m Patient Age:    89 years       BP:           128/64 mmHg Patient Gender: M              HR:           68 bpm. Exam Location:  Inpatient Procedure: 2D Echo (Both Spectral and Color Flow Doppler were utilized during            procedure). Indications:     congestive heart failure  History:         Patient has prior history of Echocardiogram examinations, most  recent 06/18/2021. Chronic kidney disease and COPD,                  Arrythmias:Atrial Fibrillation, Signs/Symptoms:Shortness of                  Breath; Risk Factors:Former Smoker and Hypertension.  Sonographer:     Dione Franks RDCS Referring Phys:  [id number] Ivor Kishi Diagnosing Phys: Constancia Delton MD IMPRESSIONS  1. Left ventricular ejection fraction, by estimation, is 50 to 55%. The left ventricle has low normal function. The left ventricle has no regional wall motion abnormalities. There is moderate concentric left ventricular hypertrophy. Left ventricular diastolic parameters are indeterminate.  2. Right ventricular systolic function  is normal. The right ventricular size is normal. There is normal pulmonary artery systolic pressure.  3. Left atrial size was severely dilated.  4. Right atrial size was severely dilated.  5. The mitral valve is normal in structure. Mild mitral valve regurgitation.  6. The aortic valve is tricuspid. Aortic valve regurgitation is mild. Aortic valve sclerosis/calcification is present, without any evidence of aortic stenosis.  7. Aortic dilatation noted. There is mild dilatation of the aortic root, measuring 40 mm.  8. The inferior vena cava is normal in size with greater than 50% respiratory variability, suggesting right atrial pressure of 3 mmHg. FINDINGS  Left Ventricle: Left ventricular ejection fraction, by estimation, is 50 to 55%. The left ventricle has low normal function. The left ventricle has no regional wall motion abnormalities. The left ventricular internal cavity size was normal in size. There is moderate concentric left ventricular hypertrophy. Left ventricular diastolic parameters are indeterminate. Right Ventricle: The right ventricular size is normal. No increase in right ventricular wall thickness. Right ventricular systolic function is normal. There is normal pulmonary artery systolic pressure. The tricuspid regurgitant velocity is 2.81 m/s, and  with an assumed right atrial pressure of 3 mmHg, the estimated right ventricular systolic pressure is 34.6 mmHg. Left Atrium: Left atrial size was severely dilated. Right Atrium: Right atrial size was severely dilated. Pericardium: Trivial pericardial effusion is present. Mitral Valve: The mitral valve is normal in structure. Mild mitral valve regurgitation. Tricuspid Valve: The tricuspid valve is normal in structure. Tricuspid valve regurgitation is mild. Aortic Valve: The aortic valve is tricuspid. Aortic valve regurgitation is mild. Aortic valve sclerosis/calcification is present, without any evidence of aortic stenosis. Pulmonic Valve: The pulmonic  valve was normal in structure. Pulmonic valve regurgitation is mild. Aorta: Aortic dilatation noted. There is mild dilatation of the aortic root, measuring 40 mm. Venous: The inferior vena cava is normal in size with greater than 50% respiratory variability, suggesting right atrial pressure of 3 mmHg. IAS/Shunts: No atrial level shunt detected by color flow Doppler.  LEFT VENTRICLE PLAX 2D LVIDd:         5.30 cm LVIDs:         3.90 cm LV PW:         1.50 cm LV IVS:        1.50 cm LVOT diam:     2.50 cm LV SV:         71 LV SV Index:   34 LVOT Area:     4.91 cm  RIGHT VENTRICLE             IVC RV Basal diam:  3.60 cm     IVC diam: 2.00 cm RV S prime:     11.40 cm/s LEFT ATRIUM  Index        RIGHT ATRIUM           Index LA diam:        6.00 cm  2.88 cm/m   RA Area:     32.80 cm LA Vol (A2C):   139.0 ml 66.76 ml/m  RA Volume:   109.00 ml 52.35 ml/m LA Vol (A4C):   130.0 ml 62.43 ml/m LA Biplane Vol: 139.0 ml 66.76 ml/m  AORTIC VALVE LVOT Vmax:   76.50 cm/s LVOT Vmean:  49.300 cm/s LVOT VTI:    0.144 m  AORTA Ao Root diam: 4.00 cm Ao Asc diam:  3.80 cm TRICUSPID VALVE TR Peak grad:   31.6 mmHg TR Vmax:        281.00 cm/s  SHUNTS Systemic VTI:  0.14 m Systemic Diam: 2.50 cm Constancia Delton MD Electronically signed by Constancia Delton MD Signature Date/Time: 08/10/2023/2:56:08 PM    Final    DG Chest Port 1 View Result Date: 08/08/2023 CLINICAL DATA:  Shortness of breath EXAM: PORTABLE CHEST 1 VIEW COMPARISON:  04/28/2023 FINDINGS: Cardiac shadow is enlarged but stable. Aortic calcifications are again seen. Patchy airspace opacity is noted in the upper lobes bilaterally. Stable calcified granuloma is noted in the left mid lung. Small effusions are seen bilaterally. IMPRESSION: Multifocal infiltrate with small effusions bilaterally. Electronically Signed   By: Violeta Grey M.D.   On: 08/08/2023 09:49    ECHO as above  TELEMETRY reviewed by me 08/13/2023: Atrial fibrillation PVCs rate 70s  EKG  reviewed by me: atrial fibrillation PVCs, baseline artifact, rate 90 bpm  Data reviewed by me 08/13/2023: last 24h vitals tele labs imaging I/O ED provider note, admission H&P, hospitalist progress note  Principal Problem:   Acute on chronic diastolic CHF (congestive heart failure) (HCC) Active Problems:   COPD (chronic obstructive pulmonary disease) (HCC)   Benign hypertension   Essential hypertension   Paroxysmal atrial fibrillation (HCC)   GERD without esophagitis   Acute respiratory failure with hypoxia (HCC)   CKD stage 3a, GFR 45-59 ml/min (HCC)   NSTEMI (non-ST elevated myocardial infarction) (HCC)   Pressure injury of skin    ASSESSMENT AND PLAN:  Dennis Savage is a 89 y.o. male  with a past medical history of chronic HFpEF, chronic atrial fibrillation on Xarelto , COPD who presented to the ED on 08/08/2023 for worsening SOB and LE edema. Cardiology was consulted for further evaluation.   # Acute hypoxic respiratory failure # Acute on chronic HFpEF # Demand ischemia # Chronic atrial fibrillation # Frequent PVCs Patient brought to the ED by EMS due to respiratory distress.  Per chart review family reported the patient had had worsening shortness of breath and lower extremity edema prior to coming to the ED.  BNP elevated at 1400.  Troponins 395 > 483.  Placed on BiPAP by EMS.  Started on IV Lasix  in the ED. Echo this admission with E 50-55%, no rWMAs, moderate LVH, mild MR, mild AR, mild aortic root dilatation 4.0 cm. -Continue IV Lasix  40 mg twice daily.  Patient reportedly has not been taking p.o. Lasix  at home. -Start metoprolol  succinate 12.5 mg daily. Continue losartan 12.5 mg daily, spironolactone  12.5 mg daily. -Strict intake/output, monitor renal function and electrolytes closely with diuresis. -Continue Xarelto  20 mg daily.  Patient reportedly had not been taking this at home. -Mildly elevated troponin most consistent with demand/supply mismatch and not ACS.  No plan for  further cardiac diagnostics at this time. -  Continue to wean O2 as able.  -Palliative has been consulted by primary team.   This patient's plan of care was discussed and created with Dr. Parks Bollman and he is in agreement.  Signed: Hamp Levine, PA-C  08/13/2023, 8:31 AM [facility] Cardiology

## 2023-08-13 NOTE — Progress Notes (Signed)
 Occupational Therapy Treatment Patient Details Name: Dennis Savage MRN: [id number] DOB: [birth year] Today's Date: 08/13/2023   History of present illness 89 y.o. male  with past medical history of proximal A-fib (Xarelto ), HFpEF, COPD, prostate cancer, CKD (stage III), HTN, GERD, and subdural hygroma admitted on 08/08/2023 with shortness of breath.  Patient had recent admission at [facility] for the same (March 2025). Patient is being treated for acute on chronic HFrEF, acute respiratory failure with volume overload, NSTEMI, and COPD.   OT comments  Pt seen for OT tx, limited by fatigue, decreased tolerance to activity and global weakness. Pt requires increased time for processing of directions with poor ability to problem-solve, requiring max multimodal cuing. Supervision for use of urinal bed level, verbal cues to sustain attn to task as pt falls asleep during void.   Pt expresses discomfort on his bottom, and is able to transition from supine to sit with MIN A. Unable to perform lateral scoots and requires MIN A to stand from elevated bed using RW to take several lateral side steps. Pt repositioned with pillows under R hip to offset pressure on bottom, left setup for lunch in bed. OT will continue to progress as able. Discharge recommendation appropriate.       If plan is discharge home, recommend the following:  A lot of help with walking and/or transfers;A lot of help with bathing/dressing/bathroom;Assistance with cooking/housework;Assist for transportation;Help with stairs or ramp for entrance   Equipment Recommendations  None recommended by OT    Recommendations for Other Services      Precautions / Restrictions Precautions Precautions: Fall Recall of Precautions/Restrictions: Intact Precaution/Restrictions Comments: monitor o2 Restrictions Weight Bearing Restrictions Per Provider Order: No       Mobility Bed Mobility Overal bed mobility: Needs Assistance Bed Mobility: Supine to  Sit     Supine to sit: Min assist, HOB elevated, Used rails Sit to supine: Min assist   General bed mobility comments: minA at trunk, max vcs for sequencing using bed features    Transfers Overall transfer level: Needs assistance Equipment used: Rolling walker (2 wheels) Transfers: Sit to/from Stand Sit to Stand: Min assist, From elevated surface           General transfer comment: bed elevated, requires 2x attempts to rise with posterior lean requiring minA to stabilize. max vcs for hand placement     Balance Overall balance assessment: Needs assistance Sitting-balance support: No upper extremity supported, Feet supported Sitting balance-Leahy Scale: Fair Sitting balance - Comments: fatigues quickly Postural control: Posterior lean Standing balance support: During functional activity, Reliant on assistive device for balance, Bilateral upper extremity supported Standing balance-Leahy Scale: Fair                             ADL either performed or assessed with clinical judgement   ADL Overall ADL's : Needs assistance/impaired                             Toileting- Clothing Manipulation and Hygiene: Bed level;Supervision/safety Toileting - Clothing Manipulation Details (indicate cue type and reason): pt uses urinal bed level due to urgency and fatigue. falls asleep, requires cues to awaken to manage clothing     Functional mobility during ADLs: Minimal assistance;Rolling walker (2 wheels) General ADL Comments: Weakness, limited tolerance to activity, max encourgement for repositioning and standing    Extremity/Trunk Assessment  Vision       Perception     Praxis     Communication Communication Communication: Impaired Factors Affecting Communication: Hearing impaired   Cognition Arousal: Lethargic Behavior During Therapy: WFL for tasks assessed/performed               OT - Cognition Comments: requires increased  time for processing, difficulties problem-solving task performance without vcs, falls asleep using urinal in bed                 Following commands: Intact        Cueing   Cueing Techniques: Verbal cues        General Comments Notified RN of urine sample, O2 97% on 3L and HR 67    Pertinent Vitals/ Pain       Pain Assessment Pain Assessment: Faces Faces Pain Scale: Hurts a little bit Pain Location: bottom Pain Descriptors / Indicators: Discomfort Pain Intervention(s): Limited activity within patient's tolerance, Repositioned   Frequency  Min 2X/week        Progress Toward Goals  OT Goals(current goals can now be found in the care plan section)  Progress towards OT goals: Progressing toward goals  Acute Rehab OT Goals OT Goal Formulation: With patient Time For Goal Achievement: 08/24/23 Potential to Achieve Goals: Good ADL Goals Pt Will Perform Grooming: with modified independence;sitting;standing Pt Will Perform Upper Body Dressing: with set-up;sitting Pt Will Perform Lower Body Dressing: with contact guard assist;sit to/from stand Pt Will Transfer to Toilet: with supervision;ambulating Pt Will Perform Toileting - Clothing Manipulation and hygiene: with supervision;sit to/from stand;sitting/lateral leans  Plan         AM-PAC OT "6 Clicks" Daily Activity     Outcome Measure   Help from another person eating meals?: None Help from another person taking care of personal grooming?: None Help from another person toileting, which includes using toliet, bedpan, or urinal?: A Lot Help from another person bathing (including washing, rinsing, drying)?: A Lot Help from another person to put on and taking off regular upper body clothing?: A Little Help from another person to put on and taking off regular lower body clothing?: A Lot 6 Click Score: 17    End of Session Equipment Utilized During Treatment: Rolling walker (2 wheels);Oxygen  OT Visit Diagnosis:  Muscle weakness (generalized) (M62.81);Unsteadiness on feet (R26.81)   Activity Tolerance Patient limited by fatigue   Patient Left in bed;with call bell/phone within reach;with bed alarm set   Nurse Communication Mobility status (urine sample)        Time: 4098-1191 OT Time Calculation (min): 23 min  Charges: OT General Charges $OT Visit: 1 Visit OT Treatments $Self Care/Home Management : 23-37 mins  Jachai Okazaki L. Mccartney Chuba, OTR/L  08/13/23, 12:37 PM

## 2023-08-13 NOTE — Progress Notes (Signed)
 Palliative Care Progress Note, Assessment & Plan   Patient Name: Dennis Savage       Date: 08/13/2023 DOB: Jan 07, 1930  Age: 89 y.o. MRN#: [id number] Attending Physician: Lorita Rosa, MD Primary Care Physician: Little Riff, MD Admit Date: 08/08/2023  Subjective: Patient is sitting up in bed, awake, alert, able to acknowledge my presence, and able to make his wishes known.  No family or friends present during my visit.  HPI: 89 y.o. male  with past medical history of proximal A-fib (Xarelto ), HFpEF, COPD, prostate cancer, CKD (stage III), HTN, GERD, and subdural hygroma admitted on 08/08/2023 with shortness of breath.  Patient had recent admission at [facility] for the same (March 2025).   Patient is being treated for acute on chronic HFrEF, acute respiratory failure with volume overload, NSTEMI, and COPD.   PMT was consulted to support patient and family with goals of care discussions.  Summary of counseling/coordination of care: Extensive chart review completed prior to meeting patient including labs, vital signs, imaging, progress notes, orders, and available advanced directive documents from current and previous encounters.   I was notified this morning by RN that patient is complaining of with urination.  UA ordered by attending.  And to see patient.  He is awake, alert, and oriented x 3.  He endorses burning sensation with urination that increases with continued urination.  He shares this is a new symptom for him.  He is provided a urine specimen but is awaiting the nurse to pick it up.  Endorses that he does not experience any pain or discomfort when he is not urinating.  He has no hesitancy and is not experiencing frequency.  Awaiting UA results at this time.  I again highlighted that PMT's role is  to discuss boundaries and goals of care with patient.  He shares that his goal is to get out of the hospital and to not be a burden on anyone.  He shares he is working with his daughter to see what the next step in his plan of care is.  He has no acute issues or complaints at this time.  No adjustment to plan of care at this time.  PMT remains available to patient and family throughout his hospitalization.  PMT will continue to follow and support.  Physical Exam Vitals reviewed.  Constitutional:      General: He is not in acute distress.    Appearance: He is normal weight. He is not ill-appearing.  HENT:     Head: Normocephalic.     Mouth/Throat:     Mouth: Mucous membranes are moist.  Eyes:     Pupils: Pupils are equal, round, and reactive to light.  Cardiovascular:     Pulses: Normal pulses.  Pulmonary:     Effort: Pulmonary effort is normal.  Abdominal:     Palpations: Abdomen is soft.  Musculoskeletal:     Comments: Generalized weakness, MAETC  Skin:    General: Skin is warm and dry.  Neurological:     Mental Status: He is alert and oriented to person, place, and time.  Psychiatric:        Mood and Affect: Mood normal.  Behavior: Behavior normal.        Thought Content: Thought content normal.             Total Time 25 minutes   Time spent includes: Detailed review of medical records (labs, imaging, vital signs), medically appropriate exam (mental status, respiratory, cardiac, skin), discussed with treatment team, counseling and educating patient, family and staff, documenting clinical information, medication management and coordination of care.  Judeen Nose L. Rebbeca Campi, DNP, FNP-BC Palliative Medicine Team

## 2023-08-14 DIAGNOSIS — I5033 Acute on chronic diastolic (congestive) heart failure: Secondary | ICD-10-CM | POA: Diagnosis not present

## 2023-08-14 LAB — BASIC METABOLIC PANEL WITH GFR
Anion gap: 11 (ref 5–15)
BUN: 47 mg/dL — ABNORMAL HIGH (ref 8–23)
CO2: 29 mmol/L (ref 22–32)
Calcium: 9 mg/dL (ref 8.9–10.3)
Chloride: 97 mmol/L — ABNORMAL LOW (ref 98–111)
Creatinine, Ser: 1.51 mg/dL — ABNORMAL HIGH (ref 0.61–1.24)
GFR, Estimated: 43 mL/min — ABNORMAL LOW (ref >60)
Glucose, Bld: 114 mg/dL — ABNORMAL HIGH (ref 70–99)
Potassium: 4.1 mmol/L (ref 3.5–5.1)
Sodium: 137 mmol/L (ref 135–145)

## 2023-08-14 MED ORDER — PHENAZOPYRIDINE HCL 200 MG PO TABS
200.0000 mg | ORAL_TABLET | Freq: Three times a day (TID) | ORAL | Status: AC
  Administered 2023-08-14 – 2023-08-15 (×3): 200 mg via ORAL
  Filled 2023-08-14 (×3): qty 1

## 2023-08-14 MED ORDER — FUROSEMIDE 40 MG PO TABS
40.0000 mg | ORAL_TABLET | Freq: Every day | ORAL | Status: DC
  Administered 2023-08-14: 40 mg via ORAL
  Filled 2023-08-14: qty 1

## 2023-08-14 NOTE — Progress Notes (Signed)
 Physical Therapy Treatment Patient Details Name: Dennis Savage MRN: [id number] DOB: 1929/11/28 Today's Date: 08/14/2023   History of Present Illness 89 y.o. male  with past medical history of proximal A-fib (Xarelto ), HFpEF, COPD, prostate cancer, CKD (stage III), HTN, GERD, and subdural hygroma admitted on 08/08/2023 with shortness of breath.  Patient had recent admission at [facility] for the same (March 2025). Patient is being treated for acute on chronic HFrEF, acute respiratory failure with volume overload, NSTEMI, and COPD.    PT Comments  Pt received upright in bed. Able to titrate 2 L/min. MinA for bed mobility and for STS to RW. Pt only able to ambulate to <> from bathroom to pass BM but is mildly incontinent on the floor. Pt needing minA for STS from commode and dependent today for pericare which he was bale to do with PT at last session. Attempts made to progress gait distance but only able to ambulate from bathroom to foot of bed before needing to turn around to sit at [facility] and return to supine. SPO2 remains > 90% but with increased WOB. Messaged NSG on importance of OOB mobility to <> from bathroom to work on pt's strength/endurance while admitted to prevent further deconditioning.    If plan is discharge home, recommend the following: A little help with bathing/dressing/bathroom;Assistance with cooking/housework;Assist for transportation;Help with stairs or ramp for entrance;A lot of help with walking and/or transfers   Can travel by private vehicle     Yes  Equipment Recommendations  Other (comment) (TBD)    Recommendations for Other Services       Precautions / Restrictions Precautions Precautions: Fall Recall of Precautions/Restrictions: Intact Precaution/Restrictions Comments: monitor o2 Restrictions Weight Bearing Restrictions Per Provider Order: No     Mobility  Bed Mobility Overal bed mobility: Needs Assistance Bed Mobility: Supine to Sit, Sit to Supine     Supine to  sit: Min assist, HOB elevated, Used rails Sit to supine: Min assist     Patient Response: Cooperative  Transfers Overall transfer level: Needs assistance Equipment used: Rolling walker (2 wheels) Transfers: Sit to/from Stand Sit to Stand: Min assist                Ambulation/Gait Ambulation/Gait assistance: Contact guard assist Gait Distance (Feet): 24 Feet Assistive device: Rolling walker (2 wheels) Gait Pattern/deviations: Step-through pattern, Decreased step length - right, Decreased step length - left, Narrow base of support       General Gait Details: Ambulated EOB to <> from bathroom   Stairs             Wheelchair Mobility     Tilt Bed Tilt Bed Patient Response: Cooperative  Modified Rankin (Stroke Patients Only)       Balance Overall balance assessment: Needs assistance Sitting-balance support: No upper extremity supported, Feet supported Sitting balance-Leahy Scale: Fair     Standing balance support: During functional activity, Reliant on assistive device for balance, Bilateral upper extremity supported Standing balance-Leahy Scale: Fair                              Hotel manager: Impaired Factors Affecting Communication: Hearing impaired  Cognition Arousal: Alert Behavior During Therapy: WFL for tasks assessed/performed   PT - Cognitive impairments: No apparent impairments                         Following commands: Intact  Cueing Cueing Techniques: Verbal cues  Exercises      General Comments General comments (skin integrity, edema, etc.): SPo2 > 90% on 2 L/min with mobility.      Pertinent Vitals/Pain Pain Assessment Pain Assessment: Faces Faces Pain Scale: Hurts a little bit Pain Location: bottom Pain Descriptors / Indicators: Discomfort Pain Intervention(s): Limited activity within patient's tolerance, Monitored during session, Repositioned    Home Living                           Prior Function            PT Goals (current goals can now be found in the care plan section) Acute Rehab PT Goals Patient Stated Goal: to improve his mobility PT Goal Formulation: With patient/family Time For Goal Achievement: 08/25/23 Potential to Achieve Goals: Good Progress towards PT goals: Progressing toward goals    Frequency    Min 3X/week      PT Plan      Co-evaluation              AM-PAC PT "6 Clicks" Mobility   Outcome Measure  Help needed turning from your back to your side while in a flat bed without using bedrails?: A Lot Help needed moving from lying on your back to sitting on the side of a flat bed without using bedrails?: A Lot Help needed moving to and from a bed to a chair (including a wheelchair)?: A Little Help needed standing up from a chair using your arms (e.g., wheelchair or bedside chair)?: A Little Help needed to walk in hospital room?: A Little Help needed climbing 3-5 steps with a railing? : A Lot 6 Click Score: 15    End of Session Equipment Utilized During Treatment: Gait belt;Oxygen Activity Tolerance: Patient tolerated treatment well Patient left: in bed;with call bell/phone within reach;with bed alarm set   PT Visit Diagnosis: Unsteadiness on feet (R26.81);Other abnormalities of gait and mobility (R26.89);Muscle weakness (generalized) (M62.81);Difficulty in walking, not elsewhere classified (R26.2)     Time: 1478-2956 PT Time Calculation (min) (ACUTE ONLY): 31 min  Charges:    $Therapeutic Activity: 23-37 mins PT General Charges $$ ACUTE PT VISIT: 1 Visit                     Marc Senior. Fairly IV, PT, DPT Physical Therapist- [place]  [facility]  08/14/2023, 1:13 PM

## 2023-08-14 NOTE — Plan of Care (Signed)
  Problem: Education: Goal: Knowledge of General Education information will improve Description: Including pain rating scale, medication(s)/side effects and non-pharmacologic comfort measures 08/14/2023 2259 by Gina Lagos, RN Outcome: Progressing 08/14/2023 2256 by Gina Lagos, RN Outcome: Progressing   Problem: Clinical Measurements: Goal: Will remain free from infection Outcome: Progressing   Problem: Clinical Measurements: Goal: Respiratory complications will improve 08/14/2023 2259 by Gina Lagos, RN Outcome: Progressing 08/14/2023 2256 by Gina Lagos, RN Outcome: Progressing   Problem: Clinical Measurements: Goal: Cardiovascular complication will be avoided 08/14/2023 2259 by Gina Lagos, RN Outcome: Progressing 08/14/2023 2256 by Gina Lagos, RN Outcome: Progressing   Problem: Activity: Goal: Risk for activity intolerance will decrease 08/14/2023 2259 by Gina Lagos, RN Outcome: Progressing 08/14/2023 2256 by Gina Lagos, RN Outcome: Progressing   Problem: Pain Managment: Goal: General experience of comfort will improve and/or be controlled 08/14/2023 2259 by Gina Lagos, RN Outcome: Progressing 08/14/2023 2256 by Gina Lagos, RN Outcome: Progressing   Problem: Safety: Goal: Ability to remain free from injury will improve 08/14/2023 2259 by Gina Lagos, RN Outcome: Progressing 08/14/2023 2256 by Gina Lagos, RN Outcome: Progressing

## 2023-08-14 NOTE — Plan of Care (Signed)
  Problem: Education: Goal: Knowledge of General Education information will improve Description: Including pain rating scale, medication(s)/side effects and non-pharmacologic comfort measures Outcome: Progressing   Problem: Clinical Measurements: Goal: Respiratory complications will improve Outcome: Progressing   Problem: Clinical Measurements: Goal: Cardiovascular complication will be avoided Outcome: Progressing   Problem: Activity: Goal: Risk for activity intolerance will decrease Outcome: Progressing   Problem: Pain Managment: Goal: General experience of comfort will improve and/or be controlled Outcome: Progressing   Problem: Safety: Goal: Ability to remain free from injury will improve Outcome: Progressing

## 2023-08-14 NOTE — Progress Notes (Signed)
 [facility] CLINIC CARDIOLOGY PROGRESS NOTE       Patient ID: Dennis Savage MRN: [id number] DOB/AGE: [birth year] 89 y.o.  Admit date: 08/08/2023 Referring Physician Dr. Pablo Boards Primary Physician Little Riff, MD  Primary Cardiologist Bary Likes (last seen in 2023) Reason for Consultation AoC HFpEF  HPI: Dennis Savage is a 89 y.o. male  with a past medical history of chronic HFpEF, chronic atrial fibrillation on Xarelto , COPD who presented to the ED on 08/08/2023 for worsening SOB and LE edema. Cardiology was consulted for further evaluation.   Interval history: -Patient seen and examined this AM, daughter present at bedside. -Continues to report burning sensation when he urinates.  -He reports breathing is stable today. Still on O2.  -Denies CP or palpitations.   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   Dizziness 02/25/2015   GERD (gastroesophageal reflux disease)    Heart murmur    Hypertension    Interstitial lung disease (HCC) 10/13/2013   LBP (low back pain) 11/03/2010   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   Peripheral vascular disease (HCC) 11/03/2010   Retinal hemorrhage 03/16/2014    Past Surgical History:  Procedure Laterality Date   APPENDECTOMY     BACK SURGERY     CARPAL TUNNEL RELEASE Bilateral    EYE SURGERY Bilateral    Cataract Extraction with IOL   HERNIA REPAIR     Umbilical Hernia X 3   JOINT REPLACEMENT     bilat  knees   NASAL SINUS SURGERY     REPLACEMENT TOTAL KNEE Bilateral    SHOULDER SURGERY Right    TONSILLECTOMY     TOTAL HIP ARTHROPLASTY Right 10/12/2015   Procedure: TOTAL HIP ARTHROPLASTY;  Surgeon: Arlyne Lame, MD;  Location: [facility] ORS;  Service: Orthopedics;  Laterality: Right;   TOTAL HIP ARTHROPLASTY Left 02/15/2023   Procedure: TOTAL HIP ARTHROPLASTY;  Surgeon: Arlyne Lame, MD;  Location: [facility] ORS;  Service: Orthopedics;  Laterality: Left;    Medications Prior to Admission  Medication Sig Dispense Refill Last Dose/Taking   acetaminophen  (TYLENOL ) 325 MG tablet Take 650 mg by mouth every 6 (six) hours as needed.   Taking As Needed   albuterol  (VENTOLIN  HFA) 108 (90 Base) MCG/ACT inhaler Inhale 2 puffs into the lungs every 6 (six) hours as needed for wheezing or shortness of breath.   Taking As Needed   amLODipine  (NORVASC ) 10 MG tablet Take 10 mg by mouth daily.   08/07/2023   Calcium  Carb-Cholecalciferol  (CALCIUM  600 + D PO) Take 1 tablet by mouth daily.   08/07/2023   cholecalciferol  (VITAMIN D3) 25 MCG (1000 UNIT) tablet Take 1,000 Units by mouth daily.   08/07/2023   docusate sodium  (COLACE) 100 MG capsule Take 100 mg by mouth 2 (two) times daily.   08/07/2023   fluticasone  (FLONASE ) 50 MCG/ACT nasal spray Place 1 spray into both nostrils 2 (two) times daily.   08/07/2023   guaiFENesin  (MUCINEX ) 600 MG  12 hr tablet Take 600 mg by mouth 2 (two) times daily.   Taking   hydrOXYzine (ATARAX) 10 MG tablet Take 10 mg by mouth at bedtime as needed for itching.   Taking As Needed   Lactulose  20 GM/30ML SOLN Take 30 mLs (20 g total) by mouth daily as needed. 450 mL 0 Taking As Needed   Multiple Vitamins-Minerals (PRESERVISION AREDS 2 PO) Take 1 capsule by mouth 2 (two) times daily.   08/07/2023   Omega-3 Fatty Acids (FISH OIL) 300 MG CAPS Take 300 mg by mouth daily.   08/07/2023   omeprazole (PRILOSEC) 20 MG capsule Take 20 mg by mouth daily.   Past Week   polyethylene glycol (MIRALAX  /  GLYCOLAX ) 17 g packet Take 17 g by mouth daily. 14 each 0 Taking   potassium chloride  (KLOR-CON ) 10 MEQ tablet Take by mouth.   Past Week   relugolix  (ORGOVYX ) 120 MG tablet Take 1 tablet (120 mg total) by mouth daily. 30 tablet 11 08/07/2023   rivaroxaban  (XARELTO ) 20 MG TABS tablet Take 20 mg by mouth daily with supper. (Patient not taking: Reported on 08/08/2023)   Not Taking   Social History   Socioeconomic History   Marital status: Single    Spouse name: Not on file   Number of children: Not on file   Years of education: Not on file   Highest education level: Not on file  Occupational History   Not on file  Tobacco Use   Smoking status: Former    Current packs/day: 1.00    Types: Cigarettes   Smokeless tobacco: Never  Vaping Use   Vaping status: Never Used  Substance and Sexual Activity   Alcohol use: No   Drug use: No   Sexual activity: Not on file  Other Topics Concern   Not on file  Social History Narrative   Not on file   Social Drivers of Health   Financial Resource Strain: Low Risk  (06/07/2023)   [facility] from [facility]   Overall Financial Resource Strain (CARDIA)    Difficulty of Paying Living Expenses: Not hard at all  Food Insecurity: No Food Insecurity (08/10/2023)   Hunger Vital Sign    Worried About Running Out of Food in the Last Year: Never true    Ran Out of Food in the Last Year: Never true  Transportation Needs: No Transportation Needs (08/10/2023)   PRAPARE - Administrator, Civil Service (Medical): No    Lack of Transportation (Non-Medical): No  Physical Activity: Not on file  Stress: Not on file  Social Connections: Moderately Isolated (08/10/2023)   Social Connection and Isolation Panel [NHANES]    Frequency of Communication with Friends and Family: More than three times a week    Frequency of Social Gatherings with Friends and Family: More than three times a week    Attends Religious Services: More than 4 times per year     Active Member of [employer] or Organizations: No    Attends Banker Meetings: Never    Marital Status: Divorced  Catering manager Violence: Not At Risk (08/10/2023)   Humiliation, Afraid, Rape, and Kick questionnaire    Fear of Current or Ex-Partner: No    Emotionally Abused: No    Physically Abused: No    Sexually Abused: No    Family History  Problem Relation Age of Onset   Bladder Cancer Neg Hx    Kidney cancer Neg Hx  Prostate cancer Neg Hx      Vitals:   08/13/23 1955 08/14/23 0326 08/14/23 0358 08/14/23 0751  BP: 105/65  116/66 (!) 119/54  Pulse: 67  68 67  Resp: 17   18  Temp:   98.4 F (36.9 C) 97.8 F (36.6 C)  TempSrc:      SpO2: 95%  97% 93%  Weight:  89.2 kg    Height:        PHYSICAL EXAM General: Ill-appearing elderly male, well nourished, in no acute distress. HEENT: Normocephalic and atraumatic. Neck: No JVD.  Lungs: Normal respiratory effort on 3L [place].  Diminished bilaterally Heart: Irregularly irregular, controlled rate. Normal S1 and S2 without gallops or murmurs.  Abdomen: Non-distended appearing.  Msk: Normal strength and tone for age. Extremities: Warm and well perfused. No clubbing, cyanosis.  No edema bilaterally.  Neuro: Alert and oriented X 3. Psych: Answers questions appropriately.   Labs: Basic Metabolic Panel: Recent Labs    08/13/23 0551 08/14/23 0602  NA 134* 137  K 3.5 4.1  CL 96* 97*  CO2 31 29  GLUCOSE 114* 114*  BUN 39* 47*  CREATININE 1.44* 1.51*  CALCIUM  8.6* 9.0   Liver Function Tests: No results for input(s): "AST", "ALT", "ALKPHOS", "BILITOT", "PROT", "ALBUMIN" in the last 72 hours.  No results for input(s): "LIPASE", "AMYLASE" in the last 72 hours. CBC: Recent Labs    08/12/23 0407 08/13/23 0551  WBC 6.7 6.7  NEUTROABS 3.8  --   HGB 11.3* 12.1*  HCT 34.0* 36.7*  MCV 84.6 85.9  PLT 211 243   Cardiac Enzymes: Recent Labs    08/12/23 1446  TROPONINIHS 129*    BNP: No results for input(s):  "BNP" in the last 72 hours.  D-Dimer: No results for input(s): "DDIMER" in the last 72 hours. Hemoglobin A1C: No results for input(s): "HGBA1C" in the last 72 hours. Fasting Lipid Panel: No results for input(s): "CHOL", "HDL", "LDLCALC", "TRIG", "CHOLHDL", "LDLDIRECT" in the last 72 hours. Thyroid Function Tests: No results for input(s): "TSH", "T4TOTAL", "T3FREE", "THYROIDAB" in the last 72 hours.  Invalid input(s): "FREET3" Anemia Panel: No results for input(s): "VITAMINB12", "FOLATE", "FERRITIN", "TIBC", "IRON", "RETICCTPCT" in the last 72 hours.   Radiology: ECHOCARDIOGRAM COMPLETE Result Date: 08/10/2023    ECHOCARDIOGRAM REPORT   Patient Name:   Dennis Savage Date of Exam: 08/10/2023 Medical Rec #:  [id number]      Height:       69.0 in Accession #:    [id number]     Weight:       203.7 lb Date of Birth:  June 21, 1929      BSA:          2.082 m Patient Age:    89 years       BP:           128/64 mmHg Patient Gender: M              HR:           68 bpm. Exam Location:  Inpatient Procedure: 2D Echo (Both Spectral and Color Flow Doppler were utilized during            procedure). Indications:     congestive heart failure  History:         Patient has prior history of Echocardiogram examinations, most                  recent 06/18/2021. Chronic kidney disease and COPD,  Arrythmias:Atrial Fibrillation, Signs/Symptoms:Shortness of                  Breath; Risk Factors:Former Smoker and Hypertension.  Sonographer:     Dione Franks RDCS Referring Phys:  [id number] Nimah Uphoff Diagnosing Phys: Dennis Delton MD IMPRESSIONS  1. Left ventricular ejection fraction, by estimation, is 50 to 55%. The left ventricle has low normal function. The left ventricle has no regional wall motion abnormalities. There is moderate concentric left ventricular hypertrophy. Left ventricular diastolic parameters are indeterminate.  2. Right ventricular systolic function is normal. The right ventricular size  is normal. There is normal pulmonary artery systolic pressure.  3. Left atrial size was severely dilated.  4. Right atrial size was severely dilated.  5. The mitral valve is normal in structure. Mild mitral valve regurgitation.  6. The aortic valve is tricuspid. Aortic valve regurgitation is mild. Aortic valve sclerosis/calcification is present, without any evidence of aortic stenosis.  7. Aortic dilatation noted. There is mild dilatation of the aortic root, measuring 40 mm.  8. The inferior vena cava is normal in size with greater than 50% respiratory variability, suggesting right atrial pressure of 3 mmHg. FINDINGS  Left Ventricle: Left ventricular ejection fraction, by estimation, is 50 to 55%. The left ventricle has low normal function. The left ventricle has no regional wall motion abnormalities. The left ventricular internal cavity size was normal in size. There is moderate concentric left ventricular hypertrophy. Left ventricular diastolic parameters are indeterminate. Right Ventricle: The right ventricular size is normal. No increase in right ventricular wall thickness. Right ventricular systolic function is normal. There is normal pulmonary artery systolic pressure. The tricuspid regurgitant velocity is 2.81 m/s, and  with an assumed right atrial pressure of 3 mmHg, the estimated right ventricular systolic pressure is 34.6 mmHg. Left Atrium: Left atrial size was severely dilated. Right Atrium: Right atrial size was severely dilated. Pericardium: Trivial pericardial effusion is present. Mitral Valve: The mitral valve is normal in structure. Mild mitral valve regurgitation. Tricuspid Valve: The tricuspid valve is normal in structure. Tricuspid valve regurgitation is mild. Aortic Valve: The aortic valve is tricuspid. Aortic valve regurgitation is mild. Aortic valve sclerosis/calcification is present, without any evidence of aortic stenosis. Pulmonic Valve: The pulmonic valve was normal in structure. Pulmonic  valve regurgitation is mild. Aorta: Aortic dilatation noted. There is mild dilatation of the aortic root, measuring 40 mm. Venous: The inferior vena cava is normal in size with greater than 50% respiratory variability, suggesting right atrial pressure of 3 mmHg. IAS/Shunts: No atrial level shunt detected by color flow Doppler.  LEFT VENTRICLE PLAX 2D LVIDd:         5.30 cm LVIDs:         3.90 cm LV PW:         1.50 cm LV IVS:        1.50 cm LVOT diam:     2.50 cm LV SV:         71 LV SV Index:   34 LVOT Area:     4.91 cm  RIGHT VENTRICLE             IVC RV Basal diam:  3.60 cm     IVC diam: 2.00 cm RV S prime:     11.40 cm/s LEFT ATRIUM              Index        RIGHT ATRIUM  Index LA diam:        6.00 cm  2.88 cm/m   RA Area:     32.80 cm LA Vol (A2C):   139.0 ml 66.76 ml/m  RA Volume:   109.00 ml 52.35 ml/m LA Vol (A4C):   130.0 ml 62.43 ml/m LA Biplane Vol: 139.0 ml 66.76 ml/m  AORTIC VALVE LVOT Vmax:   76.50 cm/s LVOT Vmean:  49.300 cm/s LVOT VTI:    0.144 m  AORTA Ao Root diam: 4.00 cm Ao Asc diam:  3.80 cm TRICUSPID VALVE TR Peak grad:   31.6 mmHg TR Vmax:        281.00 cm/s  SHUNTS Systemic VTI:  0.14 m Systemic Diam: 2.50 cm Dennis Delton MD Electronically signed by Dennis Delton MD Signature Date/Time: 08/10/2023/2:56:08 PM    Final    DG Chest Port 1 View Result Date: 08/08/2023 CLINICAL DATA:  Shortness of breath EXAM: PORTABLE CHEST 1 VIEW COMPARISON:  04/28/2023 FINDINGS: Cardiac shadow is enlarged but stable. Aortic calcifications are again seen. Patchy airspace opacity is noted in the upper lobes bilaterally. Stable calcified granuloma is noted in the left mid lung. Small effusions are seen bilaterally. IMPRESSION: Multifocal infiltrate with small effusions bilaterally. Electronically Signed   By: Violeta Grey M.D.   On: 08/08/2023 09:49    ECHO as above  TELEMETRY reviewed by me 08/14/2023: Atrial fibrillation PVCs rate 70s  EKG reviewed by me: atrial fibrillation  PVCs, baseline artifact, rate 90 bpm  Data reviewed by me 08/14/2023: last 24h vitals tele labs imaging I/O ED provider note, admission H&P, hospitalist progress note  Principal Problem:   Acute on chronic diastolic CHF (congestive heart failure) (HCC) Active Problems:   COPD (chronic obstructive pulmonary disease) (HCC)   Benign hypertension   Essential hypertension   Paroxysmal atrial fibrillation (HCC)   GERD without esophagitis   Acute respiratory failure with hypoxia (HCC)   CKD stage 3a, GFR 45-59 ml/min (HCC)   NSTEMI (non-ST elevated myocardial infarction) (HCC)   Pressure injury of skin    ASSESSMENT AND PLAN:  Dennis Savage is a 89 y.o. male  with a past medical history of chronic HFpEF, chronic atrial fibrillation on Xarelto , COPD who presented to the ED on 08/08/2023 for worsening SOB and LE edema. Cardiology was consulted for further evaluation.   # Acute hypoxic respiratory failure # Acute on chronic HFpEF # Demand ischemia # Chronic atrial fibrillation # Frequent PVCs Patient brought to the ED by EMS due to respiratory distress.  Per chart review family reported the patient had had worsening shortness of breath and lower extremity edema prior to coming to the ED.  BNP elevated at 1400.  Troponins 395 > 483.  Placed on BiPAP by EMS.  Started on IV Lasix  in the ED. Echo this admission with E 50-55%, no rWMAs, moderate LVH, mild MR, mild AR, mild aortic root dilatation 4.0 cm. -Transition to po lasix  40 mg daily. -Continue metoprolol  succinate 12.5 mg daily, losartan 12.5 mg daily, spironolactone  12.5 mg daily. -Strict intake/output, monitor renal function and electrolytes closely with diuresis. -Continue Xarelto  20 mg daily.  Patient reportedly had not been taking this at home. -Mildly elevated troponin most consistent with demand/supply mismatch and not ACS.  No plan for further cardiac diagnostics at this time. -Continue to wean O2 as able.  -Palliative has been  consulted by primary team.   This patient's plan of care was discussed and created with Dr. Parks Bollman and he is in  agreement.  Signed: Hamp Levine, PA-C  08/14/2023, 8:52 AM [facility]

## 2023-08-14 NOTE — Progress Notes (Signed)
 PROGRESS NOTE    Dennis Savage  ZOX:[id number] DOB: 12/17/1929 DOA: 08/08/2023 PCP: Little Riff, MD   Assessment & Plan:   Principal Problem:   Acute on chronic diastolic CHF (congestive heart failure) (HCC) Active Problems:   Acute respiratory failure with hypoxia (HCC)   COPD (chronic obstructive pulmonary disease) (HCC)   NSTEMI (non-ST elevated myocardial infarction) (HCC)   Essential hypertension   Paroxysmal atrial fibrillation (HCC)   GERD without esophagitis   Benign hypertension   CKD stage 3a, GFR 45-59 ml/min (HCC)   Pressure injury of skin  Assessment and Plan: Acute on chronic diastolic CHF exacerbation: continue on lasix , metoprolol , losartan,& aldactone . Monitor I/Os. Likely secondary to medication noncompliance as pt was not taking lasix  at home    Demand ischemia: as per cardio. NSTEMI r/o. No further inpatient work-up recommended as per cardio    Dysuria: UA shows rare bacteria. Pyridium x 3 doses   COPD: w/o exacerbation. Bronchodilators prn     HTN: continue on metoprolol . Holding amlodipine     Chronic a. fib: as per cardio. Continue on metoprolol , xarelto    GERD: continue on PPI    CKDIIIa: Cr is labile. Avoid nephrotoxic meds   Pressure injury: continue w/ wound care   Prostate cancer: continue on home dose of relugolix  for palliation        DVT prophylaxis: xarelto   Code Status: DNR Family Communication:  Disposition Plan: likely d/c to SNF  Level of care: Telemetry Cardiac  Status is: Inpatient Remains inpatient appropriate because: needs SNF placement     Consultants:  Cardio   Procedures:   Antimicrobials:   Subjective: Pt c/o dysuria   Objective: Vitals:   08/14/23 0358 08/14/23 0751 08/14/23 1034 08/14/23 1510  BP: 116/66 (!) 119/54 (!) 112/58 114/60  Pulse: 68 67 62 (!) 59  Resp:  18 18 18   Temp: 98.4 F (36.9 C) 97.8 F (36.6 C) 98.6 F (37 C) 98.4 F (36.9 C)  TempSrc:    Oral  SpO2: 97% 93% 96% 94%   Weight:      Height:        Intake/Output Summary (Last 24 hours) at 08/14/2023 1633 Last data filed at 08/14/2023 1424 Gross per 24 hour  Intake 360 ml  Output 1450 ml  Net -1090 ml   Filed Weights   08/12/23 0500 08/13/23 0500 08/14/23 0326  Weight: 95 kg 89.2 kg 89.2 kg    Examination:  General exam: Appears calm and comfortable  Respiratory system: Clear to auscultation. Respiratory effort normal. Cardiovascular system: S1 & S2+. No  rubs, gallops or clicks.  Gastrointestinal system: Abdomen is nondistended, soft and nontender. Normal bowel sounds heard. Central nervous system: Alert and awake. Moves all extremities  Psychiatry: Judgement and insight appear normal. Flat mood and affect    Data Reviewed: I have personally reviewed following labs and imaging studies  CBC: Recent Labs  Lab 08/08/23 0907 08/09/23 0455 08/10/23 0615 08/11/23 0601 08/12/23 0407 08/13/23 0551  WBC 10.1 8.2 6.6 6.7 6.7 6.7  NEUTROABS 6.8  --   --  3.9 3.8  --   HGB 12.1* 11.2* 10.9* 11.5* 11.3* 12.1*  HCT 38.5* 35.7* 33.7* 34.4* 34.0* 36.7*  MCV 90.2 89.3 88.2 84.7 84.6 85.9  PLT 157 155 160 199 211 243   Basic Metabolic Panel: Recent Labs  Lab 08/10/23 0615 08/11/23 0601 08/12/23 0407 08/13/23 0551 08/14/23 0602  NA 136 134* 136 134* 137  K 3.3* 3.4* 3.6 3.5 4.1  CL 101 98 98 96* 97*  CO2 27 30 30 31 29   GLUCOSE 121* 119* 123* 114* 114*  BUN 29* 28* 34* 39* 47*  CREATININE 1.35* 1.41* 1.36* 1.44* 1.51*  CALCIUM  8.4* 8.4* 8.5* 8.6* 9.0   GFR: Estimated Creatinine Clearance: 35.2 mL/min (A) (by C-G formula based on SCr of 1.51 mg/dL (H)). Liver Function Tests: Recent Labs  Lab 08/08/23 0907 08/09/23 0455  AST 24 22  ALT 20 15  ALKPHOS 55 48  BILITOT 1.4* 1.5*  PROT 6.3* 5.7*  ALBUMIN 3.5 2.9*   No results for input(s): "LIPASE", "AMYLASE" in the last 168 hours. No results for input(s): "AMMONIA" in the last 168 hours. Coagulation Profile: No results for  input(s): "INR", "PROTIME" in the last 168 hours. Cardiac Enzymes: No results for input(s): "CKTOTAL", "CKMB", "CKMBINDEX", "TROPONINI" in the last 168 hours. BNP (last 3 results) No results for input(s): "PROBNP" in the last 8760 hours. HbA1C: No results for input(s): "HGBA1C" in the last 72 hours. CBG: No results for input(s): "GLUCAP" in the last 168 hours. Lipid Profile: No results for input(s): "CHOL", "HDL", "LDLCALC", "TRIG", "CHOLHDL", "LDLDIRECT" in the last 72 hours. Thyroid Function Tests: No results for input(s): "TSH", "T4TOTAL", "FREET4", "T3FREE", "THYROIDAB" in the last 72 hours. Anemia Panel: No results for input(s): "VITAMINB12", "FOLATE", "FERRITIN", "TIBC", "IRON", "RETICCTPCT" in the last 72 hours. Sepsis Labs: No results for input(s): "PROCALCITON", "LATICACIDVEN" in the last 168 hours.  Recent Results (from the past 240 hours)  Resp panel by RT-PCR (RSV, Flu A&B, Covid) Anterior Nasal Swab     Status: None   Collection Time: 08/08/23  9:07 AM   Specimen: Anterior Nasal Swab  Result Value Ref Range Status   SARS Coronavirus 2 by RT PCR NEGATIVE NEGATIVE Final    Comment: (NOTE) SARS-CoV-2 target nucleic acids are NOT DETECTED.  The SARS-CoV-2 RNA is generally detectable in upper respiratory specimens during the acute phase of infection. The lowest concentration of SARS-CoV-2 viral copies this assay can detect is 138 copies/mL. A negative result does not preclude SARS-Cov-2 infection and should not be used as the sole basis for treatment or other patient management decisions. A negative result may occur with  improper specimen collection/handling, submission of specimen other than nasopharyngeal swab, presence of viral mutation(s) within the areas targeted by this assay, and inadequate number of viral copies(<138 copies/mL). A negative result must be combined with clinical observations, patient history, and epidemiological information. The expected result is  Negative.  Fact Sheet for Patients:  BloggerCourse.com  Fact Sheet for Healthcare Providers:  SeriousBroker.it  This test is no t yet approved or cleared by the [place]  FDA and  has been authorized for detection and/or diagnosis of SARS-CoV-2 by FDA under an Emergency Use Authorization (EUA). This EUA will remain  in effect (meaning this test can be used) for the duration of the COVID-19 declaration under Section 564(b)(1) of the Act, 21 U.S.C.section 360bbb-3(b)(1), unless the authorization is terminated  or revoked sooner.       Influenza A by PCR NEGATIVE NEGATIVE Final   Influenza B by PCR NEGATIVE NEGATIVE Final    Comment: (NOTE) The Xpert Xpress SARS-CoV-2/FLU/RSV plus assay is intended as an aid in the diagnosis of influenza from Nasopharyngeal swab specimens and should not be used as a sole basis for treatment. Nasal washings and aspirates are unacceptable for Xpert Xpress SARS-CoV-2/FLU/RSV testing.  Fact Sheet for Patients: BloggerCourse.com  Fact Sheet for Healthcare Providers: SeriousBroker.it  This test is not  yet approved or cleared by the [place]  FDA and has been authorized for detection and/or diagnosis of SARS-CoV-2 by FDA under an Emergency Use Authorization (EUA). This EUA will remain in effect (meaning this test can be used) for the duration of the COVID-19 declaration under Section 564(b)(1) of the Act, 21 U.S.C. section 360bbb-3(b)(1), unless the authorization is terminated or revoked.     Resp Syncytial Virus by PCR NEGATIVE NEGATIVE Final    Comment: (NOTE) Fact Sheet for Patients: BloggerCourse.com  Fact Sheet for Healthcare Providers: SeriousBroker.it  This test is not yet approved or cleared by the [place]  FDA and has been authorized for detection and/or diagnosis of  SARS-CoV-2 by FDA under an Emergency Use Authorization (EUA). This EUA will remain in effect (meaning this test can be used) for the duration of the COVID-19 declaration under Section 564(b)(1) of the Act, 21 U.S.C. section 360bbb-3(b)(1), unless the authorization is terminated or revoked.  Performed at [facility], [address], [place], [place] [address]          Radiology Studies: No results found.      Scheduled Meds:  docusate sodium   100 mg Oral BID   fluticasone   1 spray Each Nare BID   furosemide   40 mg Oral Daily   guaiFENesin   600 mg Oral BID   losartan  12.5 mg Oral Daily   metoprolol  succinate  12.5 mg Oral Daily   multivitamin  1 tablet Oral BID   pantoprazole   40 mg Oral Daily   phenazopyridine  200 mg Oral TID WC   polyethylene glycol  17 g Oral Daily   potassium chloride   10 mEq Oral Daily   relugolix   120 mg Oral Daily   Rivaroxaban   15 mg Oral Q supper   sodium chloride  flush  3 mL Intravenous Q12H   spironolactone   12.5 mg Oral Daily   Continuous Infusions:   LOS: 6 days      Alphonsus Jeans, MD [facility] Pager [phone]  If 7PM-7AM, please contact night-coverage [email or web address]  08/14/2023, 4:33 PM

## 2023-08-14 NOTE — TOC Progression Note (Signed)
 Transition of Care [facility]) - Progression Note    Patient Details  Name: BRIXON MORIE MRN: [id number] Date of Birth: 03-Aug-1929  Transition of Care [facility]) CM/SW Contact  Baird Bombard, RN Phone Number: 08/14/2023, 1:10 PM  Clinical Narrative:    Message sent to Hali Leventhal at [facility] completed, bed search started.         Expected Discharge Plan and Services                                               Social Determinants of Health (SDOH) Interventions SDOH Screenings   Food Insecurity: No Food Insecurity (08/10/2023)  Housing: Low Risk  (08/10/2023)  Transportation Needs: No Transportation Needs (08/10/2023)  Utilities: Not At Risk (08/10/2023)  Depression (PHQ2-9): Low Risk  (06/30/2021)  Financial Resource Strain: Low Risk  (06/07/2023)   Received from [facility]  Social Connections: Moderately Isolated (08/10/2023)  Tobacco Use: Medium Risk (08/08/2023)    Readmission Risk Interventions    04/18/2023    1:48 PM  Readmission Risk Prevention Plan  Transportation Screening Complete  Medication Review (RN Care Manager) Complete  HRI or Home Care Consult Complete  Palliative Care Screening Not Applicable  Skilled Nursing Facility Complete

## 2023-08-14 NOTE — NC FL2 (Signed)
 [place]  MEDICAID FL2 LEVEL OF CARE FORM     IDENTIFICATION  Patient Name: Dennis Savage Birthdate: 05/04/1929 Sex: male Admission Date (Current Location): 08/08/2023  [facility] and [place] Number:  Chiropodist and Address:  [facility], [address], [place], [place] [address]      Provider Number: [id number]  Attending Physician Name and Address:  Alphonsus Jeans, MD  Relative Name and Phone Number:  Corrion, Smisek (Daughter)  [phone] [facility])    Current Level of Care: Hospital Recommended Level of Care: Skilled Nursing Facility Prior Approval Number:    Date Approved/Denied:   PASRR Number: [id number]  Discharge Plan: SNF    Current Diagnoses: Patient Active Problem List   Diagnosis Date Noted   Pressure injury of skin 08/09/2023   Acute on chronic respiratory failure with hypoxia (HCC) 08/08/2023   NSTEMI (non-ST elevated myocardial infarction) (HCC) 08/08/2023   Overweight (BMI 25.0-29.9) 04/25/2023   Thrombocytopenia (HCC) 04/25/2023   Hypokalemia 04/25/2023   CKD stage 3a, GFR 45-59 ml/min (HCC) 04/08/2023   RSV (respiratory syncytial virus pneumonia) 04/05/2023   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   Exposure to potentially hazardous substance 01/13/2023   Sepsis due to pneumonia (HCC) 10/28/2022   Essential hypertension 10/28/2022   Paroxysmal atrial fibrillation (HCC) 10/28/2022   GERD without esophagitis 10/28/2022   Chronic diastolic CHF (congestive heart failure) (HCC) 10/28/2022   Acute respiratory failure with hypoxia (HCC) 10/28/2022   Generalized weakness 10/28/2022   Obesity (BMI 30-39.9) 06/21/2021   Pleural effusion on left 06/19/2021   CAP (community acquired pneumonia) 06/18/2021   Acute on chronic diastolic CHF (congestive heart failure) (HCC) 06/18/2021   Adrenal mass greater than 4 cm in diameter (HCC) 06/18/2021   Elevated troponin 06/18/2021   CHF (congestive heart failure) (HCC)  06/17/2021   Acute kidney injury superimposed on CKD (HCC) 06/17/2021   Acute CHF (congestive heart failure) (HCC) 06/17/2021   S/P total hip arthroplasty 10/12/2015   Degenerative arthritis of hip 09/12/2015   Atrial fibrillation (HCC) 07/13/2015   Apnea, sleep 07/13/2015   Diarrhea 02/25/2015   Dizziness 02/25/2015   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, 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   Retinal hemorrhage 03/16/2014   Interstitial lung disease (HCC) 10/13/2013   After cataract not obscuring vision 12/21/2011   History of surgical procedure 12/21/2011   Constipation 06/15/2011   Encounter for general adult medical examination without abnormal findings 06/15/2011   Arthralgia of multiple joints 06/15/2011   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   Artificial lens present 12/08/2010   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   Benign essential HTN 11/03/2010   COPD (chronic obstructive pulmonary disease) (HCC) 11/03/2010   Benign hypertension 11/03/2010   Low back pain 11/03/2010   Peripheral vascular disease (HCC) 11/03/2010   Current tobacco use 11/03/2010    Orientation RESPIRATION BLADDER Height & Weight     Self, Time, Situation, Place  O2 (O22L per nasal cannula) Continent Weight: 89.2 kg Height:  5\' 9"  (175.3 cm)  BEHAVIORAL SYMPTOMS/MOOD NEUROLOGICAL BOWEL NUTRITION STATUS  Other (Comment) (n/a)  (n/a) Continent Diet (Heart health/carb modified)  AMBULATORY STATUS COMMUNICATION OF NEEDS Skin   Limited Assist Verbally  (erythema to buttocks)                       Personal Care Assistance Level of Assistance  Bathing, Dressing Bathing Assistance: Limited assistance   Dressing Assistance: Limited assistance     Functional Limitations Info  Sight Sight Info:  Impaired        SPECIAL CARE FACTORS FREQUENCY  PT (By licensed PT), OT (By licensed OT)     PT Frequency: Min 2x weekly OT Frequency: Min 2x weekly            Contractures Contractures Info: Not present     Additional Factors Info  Code Status, Allergies Code Status Info: DNR Allergies Info: Lisinopril, Penicillins           Current Medications (08/14/2023):  This is the current hospital active medication list Current Facility-Administered Medications  Medication Dose Route Frequency Provider Last Rate Last Admin   acetaminophen  (TYLENOL ) tablet 650 mg  650 mg Oral Q6H PRN Newton, Steven J, MD   650 mg at 08/11/23 0818   albuterol  (PROVENTIL ) (2.5 MG/3ML) 0.083% nebulizer solution 2.5 mg  2.5 mg Nebulization Q2H PRN Lorita Rosa, MD       docusate sodium  (COLACE) capsule 100 mg  100 mg Oral BID Lorita Rosa, MD   100 mg at 08/14/23 1012   fluticasone  (FLONASE ) 50 MCG/ACT nasal spray 1 spray  1 spray Each Nare BID Lorita Rosa, MD   1 spray at 08/14/23 1016   furosemide  (LASIX ) tablet 40 mg  40 mg Oral Daily Hudson, Caralyn, PA-C   40 mg at 08/14/23 1011   guaiFENesin  (MUCINEX ) 12 hr tablet 600 mg  600 mg Oral BID Lorita Rosa, MD   600 mg at 08/14/23 1011   hydrOXYzine (ATARAX) tablet 10 mg  10 mg Oral QHS PRN Lorita Rosa, MD       lactulose  (CHRONULAC ) 10 GM/15ML solution 20 g  20 g Oral Daily PRN Lorita Rosa, MD       losartan (COZAAR) tablet 12.5 mg  12.5 mg Oral Daily Hudson, Caralyn, PA-C   12.5 mg at 08/14/23 1011   metoprolol  succinate (TOPROL -XL) 24 hr tablet 12.5 mg  12.5 mg Oral Daily Hudson, Caralyn, PA-C   12.5 mg at 08/14/23 1012   multivitamin (PROSIGHT) tablet 1 tablet  1 tablet Oral BID Lorita Rosa, MD   1 tablet at 08/14/23 1013   nitroGLYCERIN (NITROSTAT) SL tablet 0.4 mg  0.4 mg Sublingual Q5 min PRN Lorita Rosa, MD       ondansetron  (ZOFRAN ) tablet 4 mg  4 mg Oral Q6H PRN Newton, Steven J, MD       Or   ondansetron  (ZOFRAN ) injection 4 mg  4 mg Intravenous Q6H PRN Newton, Steven J, MD       Oral care mouth rinse  15 mL Mouth Rinse PRN Lanetta Pion, MD       pantoprazole  (PROTONIX ) EC tablet 40 mg  40 mg Oral Daily Lorita Rosa, MD    40 mg at 08/14/23 1011   phenazopyridine (PYRIDIUM) tablet 200 mg  200 mg Oral TID WC Williams, Jamiese M, MD   200 mg at 08/14/23 1225   polyethylene glycol (MIRALAX  / GLYCOLAX ) packet 17 g  17 g Oral Daily Lorita Rosa, MD   17 g at 08/14/23 1012   potassium chloride  (KLOR-CON  M) CR tablet 10 mEq  10 mEq Oral Daily Lorita Rosa, MD   10 mEq at 08/14/23 1012   relugolix  (ORGOVYX ) tablet 120 mg  120 mg Oral Daily Lorita Rosa, MD   120 mg at 08/13/23 2333   Rivaroxaban  (XARELTO ) tablet 15 mg  15 mg Oral Q supper Hudson, Caralyn, PA-C   15 mg at 08/13/23 1624   sodium chloride  flush (NS) 0.9 % injection 3 mL  3 mL Intravenous Q12H Corrinne Din, MD   3 mL at 08/14/23 1016   sodium chloride  flush (NS) 0.9 % injection 3 mL  3 mL Intravenous PRN Corrinne Din, MD       spironolactone  (ALDACTONE ) tablet 12.5 mg  12.5 mg Oral Daily Hudson, Caralyn, PA-C   12.5 mg at 08/14/23 1013     Discharge Medications: Please see discharge summary for a list of discharge medications.  Relevant Imaging Results:  Relevant Lab Results:   Additional Information SS #: [id number]  Baird Bombard, RN

## 2023-08-14 NOTE — Plan of Care (Signed)

## 2023-08-15 DIAGNOSIS — J9601 Acute respiratory failure with hypoxia: Secondary | ICD-10-CM | POA: Diagnosis not present

## 2023-08-15 DIAGNOSIS — J810 Acute pulmonary edema: Secondary | ICD-10-CM | POA: Diagnosis not present

## 2023-08-15 DIAGNOSIS — I5033 Acute on chronic diastolic (congestive) heart failure: Secondary | ICD-10-CM | POA: Diagnosis not present

## 2023-08-15 DIAGNOSIS — Z515 Encounter for palliative care: Secondary | ICD-10-CM | POA: Diagnosis not present

## 2023-08-15 DIAGNOSIS — I480 Paroxysmal atrial fibrillation: Secondary | ICD-10-CM

## 2023-08-15 LAB — BASIC METABOLIC PANEL WITH GFR
Anion gap: 11 (ref 5–15)
BUN: 56 mg/dL — ABNORMAL HIGH (ref 8–23)
CO2: 27 mmol/L (ref 22–32)
Calcium: 8.8 mg/dL — ABNORMAL LOW (ref 8.9–10.3)
Chloride: 99 mmol/L (ref 98–111)
Creatinine, Ser: 1.68 mg/dL — ABNORMAL HIGH (ref 0.61–1.24)
GFR, Estimated: 38 mL/min — ABNORMAL LOW (ref >60)
Glucose, Bld: 109 mg/dL — ABNORMAL HIGH (ref 70–99)
Potassium: 3.7 mmol/L (ref 3.5–5.1)
Sodium: 137 mmol/L (ref 135–145)

## 2023-08-15 MED ORDER — CEFUROXIME AXETIL 500 MG PO TABS: 500.0000 mg | ORAL_TABLET | Freq: Two times a day (BID) | ORAL | Status: DC

## 2023-08-15 MED ORDER — LOSARTAN POTASSIUM 25 MG PO TABS
12.5000 mg | ORAL_TABLET | Freq: Every day | ORAL | Status: DC
  Administered 2023-08-16 – 2023-08-23 (×8): 12.5 mg via ORAL
  Filled 2023-08-15 (×8): qty 1

## 2023-08-15 MED ORDER — SPIRONOLACTONE 12.5 MG HALF TABLET
12.5000 mg | ORAL_TABLET | Freq: Every day | ORAL | Status: DC
  Administered 2023-08-16 – 2023-08-23 (×8): 12.5 mg via ORAL
  Filled 2023-08-15 (×8): qty 1

## 2023-08-15 MED ORDER — CEFUROXIME AXETIL 250 MG PO TABS
250.0000 mg | ORAL_TABLET | Freq: Two times a day (BID) | ORAL | Status: AC
  Administered 2023-08-15 – 2023-08-20 (×10): 250 mg via ORAL
  Filled 2023-08-15 (×10): qty 1

## 2023-08-15 NOTE — Progress Notes (Signed)
 Physical Therapy Treatment Patient Details Name: Dennis Savage MRN: [id number] DOB: 06-04-1929 Today's Date: 08/15/2023   History of Present Illness 89 y.o. male  with past medical history of proximal A-fib (Xarelto ), HFpEF, COPD, prostate cancer, CKD (stage III), HTN, GERD, and subdural hygroma admitted on 08/08/2023 with shortness of breath.  Patient had recent admission at [facility] for the same (March 2025). Patient is being treated for acute on chronic HFrEF, acute respiratory failure with volume overload, NSTEMI, and COPD.    PT Comments  Pt received upright in bed agreeable to PT. SPO2 on 2 L/min with sats > 90% at rest and with mobility. Pt needing more assist today at [facility] for bed mobility. Attempts made for 5xSTS for assessment and LE strengthening but upon first rep pt reports urgent need for BM. Urgent BM's has remained a barrier to progress strengthening and endurance training the last couple sessions thus limited to ~24' of gait in room/bathroom. Reliant on NT assist for floor clean up during bathroom use. Pt remains dependent for pericare standing at RW and unable to ambulate except to EOB due to UE fatigue on RW likely to compensate for LE weakness. Pt able to transfer back to bed at supervision with all needs in reach. D/c recs remain appropriate.    If plan is discharge home, recommend the following: A little help with bathing/dressing/bathroom;Assistance with cooking/housework;Assist for transportation;Help with stairs or ramp for entrance;A lot of help with walking and/or transfers   Can travel by private vehicle     Yes  Equipment Recommendations  Other (comment) (TBD)    Recommendations for Other Services       Precautions / Restrictions Precautions Precautions: Fall Recall of Precautions/Restrictions: Intact Precaution/Restrictions Comments: monitor o2 Restrictions Weight Bearing Restrictions Per Provider Order: No     Mobility  Bed Mobility Overal bed mobility: Needs  Assistance Bed Mobility: Supine to Sit, Sit to Supine     Supine to sit: Mod assist, HOB elevated, Used rails       Patient Response: Cooperative  Transfers Overall transfer level: Needs assistance Equipment used: Rolling walker (2 wheels) Transfers: Sit to/from Stand Sit to Stand: Min assist                Ambulation/Gait Ambulation/Gait assistance: Contact guard assist Gait Distance (Feet): 24 Feet Assistive device: Rolling walker (2 wheels)         General Gait Details: Ambulated around bed to bathroom. Unable to progress gait distances due to urgent BM's.   Stairs             Wheelchair Mobility     Tilt Bed Tilt Bed Patient Response: Cooperative  Modified Rankin (Stroke Patients Only)       Balance Overall balance assessment: Needs assistance Sitting-balance support: No upper extremity supported, Feet supported Sitting balance-Leahy Scale: Fair     Standing balance support: During functional activity, Reliant on assistive device for balance, Bilateral upper extremity supported Standing balance-Leahy Scale: Fair                              Hotel manager: Impaired Factors Affecting Communication: Hearing impaired  Cognition Arousal: Alert Behavior During Therapy: WFL for tasks assessed/performed   PT - Cognitive impairments: No apparent impairments                         Following commands: Intact  Cueing Cueing Techniques: Verbal cues  Exercises      General Comments General comments (skin integrity, edema, etc.): SPO2 > 90% on 2 L/min      Pertinent Vitals/Pain Pain Assessment Pain Assessment: No/denies pain    Home Living                          Prior Function            PT Goals (current goals can now be found in the care plan section) Acute Rehab PT Goals Patient Stated Goal: to improve his mobility PT Goal Formulation: With patient/family Time  For Goal Achievement: 08/25/23 Potential to Achieve Goals: Good Progress towards PT goals: Progressing toward goals    Frequency    Min 3X/week      PT Plan      Co-evaluation              AM-PAC PT "6 Clicks" Mobility   Outcome Measure  Help needed turning from your back to your side while in a flat bed without using bedrails?: A Lot Help needed moving from lying on your back to sitting on the side of a flat bed without using bedrails?: A Lot Help needed moving to and from a bed to a chair (including a wheelchair)?: A Little Help needed standing up from a chair using your arms (e.g., wheelchair or bedside chair)?: A Little Help needed to walk in hospital room?: A Little Help needed climbing 3-5 steps with a railing? : A Lot 6 Click Score: 15    End of Session Equipment Utilized During Treatment: Gait belt;Oxygen Activity Tolerance: Patient tolerated treatment well Patient left: in bed;with call bell/phone within reach;with bed alarm set Nurse Communication: Mobility status PT Visit Diagnosis: Unsteadiness on feet (R26.81);Other abnormalities of gait and mobility (R26.89);Muscle weakness (generalized) (M62.81);Difficulty in walking, not elsewhere classified (R26.2)     Time: 1610-9604 PT Time Calculation (min) (ACUTE ONLY): 33 min  Charges:    $Therapeutic Activity: 23-37 mins PT General Charges $$ ACUTE PT VISIT: 1 Visit                     Marc Senior. Fairly IV, PT, DPT Physical Therapist- [place]  [facility]  08/15/2023, 2:27 PM

## 2023-08-15 NOTE — Progress Notes (Signed)
                                                     Palliative Care Progress Note, Assessment & Plan   Patient Name: Dennis Savage       Date: 08/15/2023 DOB: 1930-02-05  Age: 89 y.o. MRN#: [id number] Attending Physician: Alphonsus Jeans, MD Primary Care Physician: Little Riff, MD Admit Date: 08/08/2023  Subjective: Patient is sleeping, in no apparent distress.  He awakens to my presence but returns to sleep.  No family or friends present during my visit.  HPI: 89 y.o. male  with past medical history of proximal A-fib (Xarelto ), HFpEF, COPD, prostate cancer, CKD (stage III), HTN, GERD, and subdural hygroma admitted on 08/08/2023 with shortness of breath.  Patient had recent admission at [facility] for the same (March 2025).   Patient is being treated for acute on chronic HFrEF, acute respiratory failure with volume overload, NSTEMI, and COPD.   PMT was consulted to support patient and family with goals of care discussions.  Summary of counseling/coordination of care: Extensive chart review completed prior to meeting patient including labs, vital signs, imaging, progress notes, orders, and available advanced directive documents from current and previous encounters.   After reviewing the patient's chart I assessed the patient at bedside.  He has no acute complaints at this time.  No adjustment to plan of care or more needed.  From previous discussions, it is important to patient that his daughter Dennis Savage is involved in his medical decision and plan of care.  I attempted to speak with Dennis Savage over the phone.  No answer.  HIPAA compliant voicemail left with PMT contact info given.  No acute palliative needs at this time.  PMT will continue to follow and support patient and family throughout his hospitalization.  Physical Exam Vitals reviewed.  Constitutional:       General: He is not in acute distress.    Appearance: He is normal weight. He is not ill-appearing.  HENT:     Head: Normocephalic.     Mouth/Throat:     Mouth: Mucous membranes are moist.  Eyes:     Pupils: Pupils are equal, round, and reactive to light.  Pulmonary:     Effort: Pulmonary effort is normal.  Abdominal:     Palpations: Abdomen is soft.  Musculoskeletal:     Comments: Generalized weakness, MAETC  Skin:    General: Skin is warm and dry.  Neurological:     Mental Status: He is alert.  Psychiatric:        Mood and Affect: Mood normal.        Behavior: Behavior normal.             Total Time 25 minutes   Time spent includes: Detailed review of medical records (labs, imaging, vital signs), medically appropriate exam (mental status, respiratory, cardiac, skin), discussed with treatment team, counseling and educating patient, family and staff, documenting clinical information, medication management and coordination of care.  Judeen Nose L. Rebbeca Campi, DNP, FNP-BC Palliative Medicine Team

## 2023-08-15 NOTE — Plan of Care (Signed)

## 2023-08-15 NOTE — Plan of Care (Signed)
  Problem: Education: Goal: Knowledge of General Education information will improve Description: Including pain rating scale, medication(s)/side effects and non-pharmacologic comfort measures Outcome: Progressing   Problem: Clinical Measurements: Goal: Respiratory complications will improve Outcome: Progressing   Problem: Clinical Measurements: Goal: Cardiovascular complication will be avoided Outcome: Progressing   Problem: Elimination: Goal: Will not experience complications related to bowel motility Outcome: Progressing   Problem: Elimination: Goal: Will not experience complications related to urinary retention Outcome: Progressing   Problem: Pain Managment: Goal: General experience of comfort will improve and/or be controlled Outcome: Progressing   Problem: Safety: Goal: Ability to remain free from injury will improve Outcome: Progressing

## 2023-08-15 NOTE — Progress Notes (Signed)
 Dennis Savage:[id number] DOB: 23-Sep-1929 DOA: 08/08/2023 PCP: Little Riff, MD   Assessment & Plan:   Principal Problem:   Acute on chronic diastolic CHF (congestive heart failure) (HCC) Active Problems:   Acute respiratory failure with hypoxia (HCC)   COPD (chronic obstructive pulmonary disease) (HCC)   NSTEMI (non-ST elevated myocardial infarction) (HCC)   Essential hypertension   Paroxysmal atrial fibrillation (HCC)   GERD without esophagitis   Benign hypertension   CKD stage 3a, GFR 45-59 ml/min (HCC)   Pressure injury of skin  Assessment and Plan: Acute on chronic diastolic CHF exacerbation: holding lasix , aldactone  & losartan  as Cr is trending up as per cardio.Continue on metoprolol . Monitor I/Os. Likely secondary to medication noncompliance as pt was not taking lasix  at home    Demand ischemia: as per cardio. NSTEMI r/o. No further inpatient work-up recommended as per cardio    Dysuria: UA shows rare bacteria. No improvement w/ pyridium  x 3 doses. Started on ceftin    COPD: w/o exacerbation. Bronchodilators prn     HTN: continue on metoprolol . Holding amlodipine , losartan , aldactone     Chronic a. fib: as per cardio. Continue on metoprolol , xarelto    GERD: continue on PPI    CKDIIIa: Cr is labile. Avoid nephrotoxic meds    Pressure injury: continue w/ wound care    Prostate cancer: continue on home dose of relugolix  for palliation        DVT prophylaxis: xarelto   Code Status: DNR Family Communication: called pt's daughter, [name] x2, no answer so I left a voicemail  Disposition Plan: likely d/c to SNF  Level of care: Telemetry Cardiac  Status is: Inpatient Remains inpatient appropriate because: needs SNF placement     Consultants:  Cardio   Procedures:   Antimicrobials:   Subjective: Pt still c/o dysuria   Objective: Vitals:   08/15/23 0008 08/15/23 0504 08/15/23 0526 08/15/23 0758  BP: (!) 110/57 (!) 101/56  (!)  119/47  Pulse: 60 67  (!) 58  Resp: 20 18  18   Temp: 98.7 F (37.1 C) 97.8 F (36.6 C)  98.2 F (36.8 C)  TempSrc:    Axillary  SpO2: 95% 95%  90%  Weight:   84.4 kg   Height:        Intake/Output Summary (Last 24 hours) at 08/15/2023 0918 Last data filed at 08/15/2023 0551 Gross per 24 hour  Intake 720 ml  Output 1350 ml  Net -630 ml   Filed Weights   08/13/23 0500 08/14/23 0326 08/15/23 0526  Weight: 89.2 kg 89.2 kg 84.4 kg    Examination:  General exam: appears calm but uncomfortable  Respiratory system: decreased breath sounds b/l  Cardiovascular system: irregularly irregular   Gastrointestinal system: Abd is soft, NT, ND & hypoactive bowel sounds Central nervous system: Alert & awake. Moves all extremities  Psychiatry: Judgement and insight appears at baseline. Flat mood and affec    Data Reviewed: I have personally reviewed following labs and imaging studies  CBC: Recent Labs  Lab 08/09/23 0455 08/10/23 0615 08/11/23 0601 08/12/23 0407 08/13/23 0551  WBC 8.2 6.6 6.7 6.7 6.7  NEUTROABS  --   --  3.9 3.8  --   HGB 11.2* 10.9* 11.5* 11.3* 12.1*  HCT 35.7* 33.7* 34.4* 34.0* 36.7*  MCV 89.3 88.2 84.7 84.6 85.9  PLT 155 160 199 211 243   Basic Metabolic Panel: Recent Labs  Lab 08/11/23 0601 08/12/23 0407 08/13/23 0551 08/14/23 0602 08/15/23  9562  NA 134* 136 134* 137 137  K 3.4* 3.6 3.5 4.1 3.7  CL 98 98 96* 97* 99  CO2 30 30 31 29 27   GLUCOSE 119* 123* 114* 114* 109*  BUN 28* 34* 39* 47* 56*  CREATININE 1.41* 1.36* 1.44* 1.51* 1.68*  CALCIUM  8.4* 8.5* 8.6* 9.0 8.8*   GFR: Estimated Creatinine Clearance: 28.6 mL/min (A) (by C-G formula based on SCr of 1.68 mg/dL (H)). Liver Function Tests: Recent Labs  Lab 08/09/23 0455  AST 22  ALT 15  ALKPHOS 48  BILITOT 1.5*  PROT 5.7*  ALBUMIN 2.9*   No results for input(s): "LIPASE", "AMYLASE" in the last 168 hours. No results for input(s): "AMMONIA" in the last 168 hours. Coagulation Profile: No  results for input(s): "INR", "PROTIME" in the last 168 hours. Cardiac Enzymes: No results for input(s): "CKTOTAL", "CKMB", "CKMBINDEX", "TROPONINI" in the last 168 hours. BNP (last 3 results) No results for input(s): "PROBNP" in the last 8760 hours. HbA1C: No results for input(s): "HGBA1C" in the last 72 hours. CBG: No results for input(s): "GLUCAP" in the last 168 hours. Lipid Profile: No results for input(s): "CHOL", "HDL", "LDLCALC", "TRIG", "CHOLHDL", "LDLDIRECT" in the last 72 hours. Thyroid Function Tests: No results for input(s): "TSH", "T4TOTAL", "FREET4", "T3FREE", "THYROIDAB" in the last 72 hours. Anemia Panel: No results for input(s): "VITAMINB12", "FOLATE", "FERRITIN", "TIBC", "IRON", "RETICCTPCT" in the last 72 hours. Sepsis Labs: No results for input(s): "PROCALCITON", "LATICACIDVEN" in the last 168 hours.  Recent Results (from the past 240 hours)  Resp panel by RT-PCR (RSV, Flu A&B, Covid) Anterior Nasal Swab     Status: None   Collection Time: 08/08/23  9:07 AM   Specimen: Anterior Nasal Swab  Result Value Ref Range Status   SARS Coronavirus 2 by RT PCR NEGATIVE NEGATIVE Final    Comment: (NOTE) SARS-CoV-2 target nucleic acids are NOT DETECTED.  The SARS-CoV-2 RNA is generally detectable in upper respiratory specimens during the acute phase of infection. The lowest concentration of SARS-CoV-2 viral copies this assay can detect is 138 copies/mL. A negative result does not preclude SARS-Cov-2 infection and should not be used as the sole basis for treatment or other patient management decisions. A negative result may occur with  improper specimen collection/handling, submission of specimen other than nasopharyngeal swab, presence of viral mutation(s) within the areas targeted by this assay, and inadequate number of viral copies(<138 copies/mL). A negative result must be combined with clinical observations, patient history, and epidemiological information. The  expected result is Negative.  Fact Sheet for Patients:  BloggerCourse.com  Fact Sheet for Healthcare Providers:  SeriousBroker.it  This test is no t yet approved or cleared by the [place]  FDA and  has been authorized for detection and/or diagnosis of SARS-CoV-2 by FDA under an Emergency Use Authorization (EUA). This EUA will remain  in effect (meaning this test can be used) for the duration of the COVID-19 declaration under Section 564(b)(1) of the Act, 21 U.S.C.section 360bbb-3(b)(1), unless the authorization is terminated  or revoked sooner.       Influenza A by PCR NEGATIVE NEGATIVE Final   Influenza B by PCR NEGATIVE NEGATIVE Final    Comment: (NOTE) The Xpert Xpress SARS-CoV-2/FLU/RSV plus assay is intended as an aid in the diagnosis of influenza from Nasopharyngeal swab specimens and should not be used as a sole basis for treatment. Nasal washings and aspirates are unacceptable for Xpert Xpress SARS-CoV-2/FLU/RSV testing.  Fact Sheet for Patients: BloggerCourse.com  Fact Sheet for  Healthcare Providers: SeriousBroker.it  This test is not yet approved or cleared by the [place]  FDA and has been authorized for detection and/or diagnosis of SARS-CoV-2 by FDA under an Emergency Use Authorization (EUA). This EUA will remain in effect (meaning this test can be used) for the duration of the COVID-19 declaration under Section 564(b)(1) of the Act, 21 U.S.C. section 360bbb-3(b)(1), unless the authorization is terminated or revoked.     Resp Syncytial Virus by PCR NEGATIVE NEGATIVE Final    Comment: (NOTE) Fact Sheet for Patients: BloggerCourse.com  Fact Sheet for Healthcare Providers: SeriousBroker.it  This test is not yet approved or cleared by the [place]  FDA and has been authorized for detection and/or  diagnosis of SARS-CoV-2 by FDA under an Emergency Use Authorization (EUA). This EUA will remain in effect (meaning this test can be used) for the duration of the COVID-19 declaration under Section 564(b)(1) of the Act, 21 U.S.C. section 360bbb-3(b)(1), unless the authorization is terminated or revoked.  Performed at [facility], [address], [place], [place] [address]          Radiology Studies: No results found.      Scheduled Meds:  docusate sodium   100 mg Oral BID   fluticasone   1 spray Each Nare BID   guaiFENesin   600 mg Oral BID   [START ON 08/16/2023] losartan   12.5 mg Oral Daily   metoprolol  succinate  12.5 mg Oral Daily   multivitamin  1 tablet Oral BID   pantoprazole   40 mg Oral Daily   polyethylene glycol  17 g Oral Daily   potassium chloride   10 mEq Oral Daily   relugolix   120 mg Oral Daily   Rivaroxaban   15 mg Oral Q supper   sodium chloride  flush  3 mL Intravenous Q12H   [START ON 08/16/2023] spironolactone   12.5 mg Oral Daily   Continuous Infusions:   LOS: 7 days      Alphonsus Jeans, MD [facility] Pager [phone]  If 7PM-7AM, please contact night-coverage [email or web address]  08/15/2023, 9:18 AM

## 2023-08-15 NOTE — Progress Notes (Signed)
 PHARMACY NOTE:  ANTIMICROBIAL RENAL DOSAGE ADJUSTMENT  Current antimicrobial regimen includes a mismatch between antimicrobial dosage and estimated renal function.  As per policy approved by the Pharmacy & Therapeutics and Medical Executive Committees, the antimicrobial dosage will be adjusted accordingly.  Current antimicrobial dosage:  Cefuroxime  500 mg twice daily x 5 days  Indication: UTI?  Renal Function:   Estimated Creatinine Clearance: 28.6 mL/min (A) (by C-G formula based on SCr of 1.68 mg/dL (H)).  []      On intermittent HD, scheduled: []      On CRRT    Antimicrobial dosage has been changed to:  Cefuroxime  250 mg twice daily x 5 days  Additional comments:   Thank you for allowing pharmacy to be a part of this patient's care.  Gilman Lade [place], [facility] 08/15/2023 12:08 PM

## 2023-08-15 NOTE — Progress Notes (Signed)
 [facility] CLINIC CARDIOLOGY PROGRESS NOTE       Patient ID: Dennis Savage MRN: [id number] DOB/AGE: [birth year] 89 y.o.  Admit date: 08/08/2023 Referring Physician Dr. Pablo Boards Primary Physician Little Riff, MD  Primary Cardiologist Bary Likes (last seen in 2023) Reason for Consultation AoC HFpEF  HPI: Dennis Savage is a 89 y.o. male  with a past medical history of chronic HFpEF, chronic atrial fibrillation on Xarelto , COPD who presented to the ED on 08/08/2023 for worsening SOB and LE edema. Cardiology was consulted for further evaluation.   Interval history: -Patient seen and examined this AM, daughter present at bedside. -Continues to report burning sensation when he urinates.  Daughter expresses concern that this has not improved. -He reports breathing is stable today. Still on O2 but this has been weaned.  -Denies CP or palpitations.   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   Dizziness 02/25/2015   GERD (gastroesophageal reflux disease)    Heart murmur    Hypertension    Interstitial lung disease (HCC) 10/13/2013   LBP (low back pain) 11/03/2010   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   Peripheral vascular disease (HCC) 11/03/2010   Retinal hemorrhage 03/16/2014    Past Surgical History:  Procedure Laterality Date   APPENDECTOMY     BACK SURGERY     CARPAL TUNNEL RELEASE Bilateral    EYE SURGERY Bilateral    Cataract Extraction with  IOL   HERNIA REPAIR     Umbilical Hernia X 3   JOINT REPLACEMENT     bilat knees   NASAL SINUS SURGERY     REPLACEMENT TOTAL KNEE Bilateral    SHOULDER SURGERY Right    TONSILLECTOMY     TOTAL HIP ARTHROPLASTY Right 10/12/2015   Procedure: TOTAL HIP ARTHROPLASTY;  Surgeon: Arlyne Lame, MD;  Location: [facility] ORS;  Service: Orthopedics;  Laterality: Right;   TOTAL HIP ARTHROPLASTY Left 02/15/2023   Procedure: TOTAL HIP ARTHROPLASTY;  Surgeon: Arlyne Lame, MD;  Location: [facility] ORS;  Service: Orthopedics;  Laterality: Left;    Medications Prior to Admission  Medication Sig Dispense Refill Last Dose/Taking   acetaminophen  (TYLENOL ) 325 MG tablet Take 650 mg by mouth every 6 (six) hours as needed.   Taking As Needed   albuterol  (VENTOLIN  HFA) 108 (90 Base) MCG/ACT inhaler Inhale 2 puffs into the lungs every 6 (six) hours as needed for wheezing or shortness of breath.   Taking As Needed   amLODipine  (NORVASC ) 10 MG tablet Take 10 mg by mouth daily.   08/07/2023   Calcium  Carb-Cholecalciferol  (CALCIUM  600 + D PO) Take 1 tablet by mouth daily.   08/07/2023   cholecalciferol  (VITAMIN D3) 25 MCG (1000 UNIT) tablet Take 1,000 Units by mouth daily.   08/07/2023   docusate sodium  (COLACE) 100 MG capsule Take 100 mg by mouth 2 (two) times daily.   08/07/2023   fluticasone  (FLONASE ) 50 MCG/ACT nasal spray Place 1 spray into both nostrils  2 (two) times daily.   08/07/2023   guaiFENesin  (MUCINEX ) 600 MG 12 hr tablet Take 600 mg by mouth 2 (two) times daily.   Taking   hydrOXYzine  (ATARAX ) 10 MG tablet Take 10 mg by mouth at bedtime as needed for itching.   Taking As Needed   Lactulose  20 GM/30ML SOLN Take 30 mLs (20 g total) by mouth daily as needed. 450 mL 0 Taking As Needed   Multiple Vitamins-Minerals (PRESERVISION AREDS 2 PO) Take 1 capsule by mouth 2 (two) times daily.   08/07/2023   Omega-3 Fatty Acids (FISH OIL) 300 MG CAPS Take 300 mg by mouth daily.   08/07/2023   omeprazole (PRILOSEC) 20 MG capsule  Take 20 mg by mouth daily.   Past Week   polyethylene glycol (MIRALAX  / GLYCOLAX ) 17 g packet Take 17 g by mouth daily. 14 each 0 Taking   potassium chloride  (KLOR-CON ) 10 MEQ tablet Take by mouth.   Past Week   relugolix  (ORGOVYX ) 120 MG tablet Take 1 tablet (120 mg total) by mouth daily. 30 tablet 11 08/07/2023   rivaroxaban  (XARELTO ) 20 MG TABS tablet Take 20 mg by mouth daily with supper. (Patient not taking: Reported on 08/08/2023)   Not Taking   Social History   Socioeconomic History   Marital status: Single    Spouse name: Not on file   Number of children: Not on file   Years of education: Not on file   Highest education level: Not on file  Occupational History   Not on file  Tobacco Use   Smoking status: Former    Current packs/day: 1.00    Types: Cigarettes   Smokeless tobacco: Never  Vaping Use   Vaping status: Never Used  Substance and Sexual Activity   Alcohol use: No   Drug use: No   Sexual activity: Not on file  Other Topics Concern   Not on file  Social History Narrative   Not on file   Social Drivers of Health   Financial Resource Strain: Low Risk  (06/07/2023)   Received from [facility]   Overall Financial Resource Strain (CARDIA)    Difficulty of Paying Living Expenses: Not hard at all  Food Insecurity: No Food Insecurity (08/10/2023)   Hunger Vital Sign    Worried About Running Out of Food in the Last Year: Never true    Ran Out of Food in the Last Year: Never true  Transportation Needs: No Transportation Needs (08/10/2023)   PRAPARE - Administrator, Civil Service (Medical): No    Lack of Transportation (Non-Medical): No  Physical Activity: Not on file  Stress: Not on file  Social Connections: Moderately Isolated (08/10/2023)   Social Connection and Isolation Panel [NHANES]    Frequency of Communication with Friends and Family: More than three times a week    Frequency of Social Gatherings with Friends and Family: More than three times  a week    Attends Religious Services: More than 4 times per year    Active Member of [employer] or Organizations: No    Attends Banker Meetings: Never    Marital Status: Divorced  Catering manager Violence: Not At Risk (08/10/2023)   Humiliation, Afraid, Rape, and Kick questionnaire    Fear of Current or Ex-Partner: No    Emotionally Abused: No    Physically Abused: No    Sexually Abused: No    Family History  Problem Relation Age of Onset   Bladder  Cancer Neg Hx    Kidney cancer Neg Hx    Prostate cancer Neg Hx      Vitals:   08/15/23 0008 08/15/23 0504 08/15/23 0526 08/15/23 0758  BP: (!) 110/57 (!) 101/56  (!) 119/47  Pulse: 60 67  (!) 58  Resp: 20 18  18   Temp: 98.7 F (37.1 C) 97.8 F (36.6 C)  98.2 F (36.8 C)  TempSrc:    Axillary  SpO2: 95% 95%  90%  Weight:   84.4 kg   Height:        PHYSICAL EXAM General: Ill-appearing elderly male, well nourished, in no acute distress. HEENT: Normocephalic and atraumatic. Neck: No JVD.  Lungs: Normal respiratory effort on 3L [place].  Diminished bilaterally Heart: Irregularly irregular, controlled rate. Normal S1 and S2 without gallops or murmurs.  Abdomen: Non-distended appearing.  Msk: Normal strength and tone for age. Extremities: Warm and well perfused. No clubbing, cyanosis.  No edema bilaterally.  Neuro: Alert and oriented X 3. Psych: Answers questions appropriately.   Labs: Basic Metabolic Panel: Recent Labs    08/14/23 0602 08/15/23 0632  NA 137 137  K 4.1 3.7  CL 97* 99  CO2 29 27  GLUCOSE 114* 109*  BUN 47* 56*  CREATININE 1.51* 1.68*  CALCIUM  9.0 8.8*   Liver Function Tests: No results for input(s): "AST", "ALT", "ALKPHOS", "BILITOT", "PROT", "ALBUMIN" in the last 72 hours.  No results for input(s): "LIPASE", "AMYLASE" in the last 72 hours. CBC: Recent Labs    08/13/23 0551  WBC 6.7  HGB 12.1*  HCT 36.7*  MCV 85.9  PLT 243   Cardiac Enzymes: Recent Labs    08/12/23 1446   TROPONINIHS 129*    BNP: No results for input(s): "BNP" in the last 72 hours.  D-Dimer: No results for input(s): "DDIMER" in the last 72 hours. Hemoglobin A1C: No results for input(s): "HGBA1C" in the last 72 hours. Fasting Lipid Panel: No results for input(s): "CHOL", "HDL", "LDLCALC", "TRIG", "CHOLHDL", "LDLDIRECT" in the last 72 hours. Thyroid Function Tests: No results for input(s): "TSH", "T4TOTAL", "T3FREE", "THYROIDAB" in the last 72 hours.  Invalid input(s): "FREET3" Anemia Panel: No results for input(s): "VITAMINB12", "FOLATE", "FERRITIN", "TIBC", "IRON", "RETICCTPCT" in the last 72 hours.   Radiology: ECHOCARDIOGRAM COMPLETE Result Date: 08/10/2023    ECHOCARDIOGRAM REPORT   Patient Name:   Dennis Savage Date of Exam: 08/10/2023 Medical Rec #:  [id number]      Height:       69.0 in Accession #:    [id number]     Weight:       203.7 lb Date of Birth:  1929/11/24      BSA:          2.082 m Patient Age:    89 years       BP:           128/64 mmHg Patient Gender: M              HR:           68 bpm. Exam Location:  Inpatient Procedure: 2D Echo (Both Spectral and Color Flow Doppler were utilized during            procedure). Indications:     congestive heart failure  History:         Patient has prior history of Echocardiogram examinations, most                  recent 06/18/2021.  Chronic kidney disease and COPD,                  Arrythmias:Atrial Fibrillation, Signs/Symptoms:Shortness of                  Breath; Risk Factors:Former Smoker and Hypertension.  Sonographer:     Dione Franks RDCS Referring Phys:  [id number] Aissatou Fronczak Diagnosing Phys: Constancia Delton MD IMPRESSIONS  1. Left ventricular ejection fraction, by estimation, is 50 to 55%. The left ventricle has low normal function. The left ventricle has no regional wall motion abnormalities. There is moderate concentric left ventricular hypertrophy. Left ventricular diastolic parameters are indeterminate.  2. Right ventricular  systolic function is normal. The right ventricular size is normal. There is normal pulmonary artery systolic pressure.  3. Left atrial size was severely dilated.  4. Right atrial size was severely dilated.  5. The mitral valve is normal in structure. Mild mitral valve regurgitation.  6. The aortic valve is tricuspid. Aortic valve regurgitation is mild. Aortic valve sclerosis/calcification is present, without any evidence of aortic stenosis.  7. Aortic dilatation noted. There is mild dilatation of the aortic root, measuring 40 mm.  8. The inferior vena cava is normal in size with greater than 50% respiratory variability, suggesting right atrial pressure of 3 mmHg. FINDINGS  Left Ventricle: Left ventricular ejection fraction, by estimation, is 50 to 55%. The left ventricle has low normal function. The left ventricle has no regional wall motion abnormalities. The left ventricular internal cavity size was normal in size. There is moderate concentric left ventricular hypertrophy. Left ventricular diastolic parameters are indeterminate. Right Ventricle: The right ventricular size is normal. No increase in right ventricular wall thickness. Right ventricular systolic function is normal. There is normal pulmonary artery systolic pressure. The tricuspid regurgitant velocity is 2.81 m/s, and  with an assumed right atrial pressure of 3 mmHg, the estimated right ventricular systolic pressure is 34.6 mmHg. Left Atrium: Left atrial size was severely dilated. Right Atrium: Right atrial size was severely dilated. Pericardium: Trivial pericardial effusion is present. Mitral Valve: The mitral valve is normal in structure. Mild mitral valve regurgitation. Tricuspid Valve: The tricuspid valve is normal in structure. Tricuspid valve regurgitation is mild. Aortic Valve: The aortic valve is tricuspid. Aortic valve regurgitation is mild. Aortic valve sclerosis/calcification is present, without any evidence of aortic stenosis. Pulmonic  Valve: The pulmonic valve was normal in structure. Pulmonic valve regurgitation is mild. Aorta: Aortic dilatation noted. There is mild dilatation of the aortic root, measuring 40 mm. Venous: The inferior vena cava is normal in size with greater than 50% respiratory variability, suggesting right atrial pressure of 3 mmHg. IAS/Shunts: No atrial level shunt detected by color flow Doppler.  LEFT VENTRICLE PLAX 2D LVIDd:         5.30 cm LVIDs:         3.90 cm LV PW:         1.50 cm LV IVS:        1.50 cm LVOT diam:     2.50 cm LV SV:         71 LV SV Index:   34 LVOT Area:     4.91 cm  RIGHT VENTRICLE             IVC RV Basal diam:  3.60 cm     IVC diam: 2.00 cm RV S prime:     11.40 cm/s LEFT ATRIUM  Index        RIGHT ATRIUM           Index LA diam:        6.00 cm  2.88 cm/m   RA Area:     32.80 cm LA Vol (A2C):   139.0 ml 66.76 ml/m  RA Volume:   109.00 ml 52.35 ml/m LA Vol (A4C):   130.0 ml 62.43 ml/m LA Biplane Vol: 139.0 ml 66.76 ml/m  AORTIC VALVE LVOT Vmax:   76.50 cm/s LVOT Vmean:  49.300 cm/s LVOT VTI:    0.144 m  AORTA Ao Root diam: 4.00 cm Ao Asc diam:  3.80 cm TRICUSPID VALVE TR Peak grad:   31.6 mmHg TR Vmax:        281.00 cm/s  SHUNTS Systemic VTI:  0.14 m Systemic Diam: 2.50 cm Constancia Delton MD Electronically signed by Constancia Delton MD Signature Date/Time: 08/10/2023/2:56:08 PM    Final    DG Chest Port 1 View Result Date: 08/08/2023 CLINICAL DATA:  Shortness of breath EXAM: PORTABLE CHEST 1 VIEW COMPARISON:  04/28/2023 FINDINGS: Cardiac shadow is enlarged but stable. Aortic calcifications are again seen. Patchy airspace opacity is noted in the upper lobes bilaterally. Stable calcified granuloma is noted in the left mid lung. Small effusions are seen bilaterally. IMPRESSION: Multifocal infiltrate with small effusions bilaterally. Electronically Signed   By: Violeta Grey M.D.   On: 08/08/2023 09:49    ECHO as above  TELEMETRY reviewed by me 08/15/2023: Atrial fibrillation  PVCs rate 60s  EKG reviewed by me: atrial fibrillation PVCs, baseline artifact, rate 90 bpm  Data reviewed by me 08/15/2023: last 24h vitals tele labs imaging I/O ED provider note, admission H&P, hospitalist progress note  Principal Problem:   Acute on chronic diastolic CHF (congestive heart failure) (HCC) Active Problems:   COPD (chronic obstructive pulmonary disease) (HCC)   Benign hypertension   Essential hypertension   Paroxysmal atrial fibrillation (HCC)   GERD without esophagitis   Acute respiratory failure with hypoxia (HCC)   CKD stage 3a, GFR 45-59 ml/min (HCC)   NSTEMI (non-ST elevated myocardial infarction) (HCC)   Pressure injury of skin    ASSESSMENT AND PLAN:  Dennis Savage is a 89 y.o. male  with a past medical history of chronic HFpEF, chronic atrial fibrillation on Xarelto , COPD who presented to the ED on 08/08/2023 for worsening SOB and LE edema. Cardiology was consulted for further evaluation.   # Acute hypoxic respiratory failure # Acute on chronic HFpEF # Demand ischemia # Chronic atrial fibrillation # Frequent PVCs Patient brought to the ED by EMS due to respiratory distress.  Per chart review family reported the patient had had worsening shortness of breath and lower extremity edema prior to coming to the ED.  BNP elevated at 1400.  Troponins 395 > 483.  Placed on BiPAP by EMS.  Started on IV Lasix  in the ED. Echo this admission with E 50-55%, no rWMAs, moderate LVH, mild MR, mild AR, mild aortic root dilatation 4.0 cm. - Hold Lasix , losartan , spironolactone  today given uptrending creatinine. -Continue metoprolol  succinate 12.5 mg daily. -Strict intake/output, monitor renal function and electrolytes closely with diuresis. -Continue Xarelto  20 mg daily.  Patient reportedly had not been taking this at home. -Mildly elevated troponin most consistent with demand/supply mismatch and not ACS.  No plan for further cardiac diagnostics at this time. -Continue to wean O2  as able.  -Palliative has been consulted by primary team.   This patient's  plan of care was discussed and created with Dr. Parks Bollman and he is in agreement.  Signed: Hamp Levine, PA-C  08/15/2023, 10:04 AM [facility] Cardiology

## 2023-08-16 DIAGNOSIS — I480 Paroxysmal atrial fibrillation: Secondary | ICD-10-CM | POA: Diagnosis not present

## 2023-08-16 DIAGNOSIS — I5033 Acute on chronic diastolic (congestive) heart failure: Secondary | ICD-10-CM | POA: Diagnosis not present

## 2023-08-16 DIAGNOSIS — J9601 Acute respiratory failure with hypoxia: Secondary | ICD-10-CM | POA: Diagnosis not present

## 2023-08-16 DIAGNOSIS — J810 Acute pulmonary edema: Secondary | ICD-10-CM | POA: Diagnosis not present

## 2023-08-16 LAB — BASIC METABOLIC PANEL WITH GFR
Anion gap: 10 (ref 5–15)
BUN: 53 mg/dL — ABNORMAL HIGH (ref 8–23)
CO2: 26 mmol/L (ref 22–32)
Calcium: 8.8 mg/dL — ABNORMAL LOW (ref 8.9–10.3)
Chloride: 98 mmol/L (ref 98–111)
Creatinine, Ser: 1.44 mg/dL — ABNORMAL HIGH (ref 0.61–1.24)
GFR, Estimated: 46 mL/min — ABNORMAL LOW (ref >60)
Glucose, Bld: 118 mg/dL — ABNORMAL HIGH (ref 70–99)
Potassium: 3.8 mmol/L (ref 3.5–5.1)
Sodium: 134 mmol/L — ABNORMAL LOW (ref 135–145)

## 2023-08-16 NOTE — Plan of Care (Signed)

## 2023-08-16 NOTE — Progress Notes (Signed)
 PROGRESS NOTE    BAYRO TORELL  WUJ:[id number] DOB: 1930/03/10 DOA: 08/08/2023 PCP: [place], MD   Assessment & Plan:   Principal Problem:   Acute on chronic diastolic CHF (congestive heart failure) (HCC) Active Problems:   Acute respiratory failure with hypoxia (HCC)   COPD (chronic obstructive pulmonary disease) (HCC)   NSTEMI (non-ST elevated myocardial infarction) (HCC)   Essential hypertension   Paroxysmal atrial fibrillation (HCC)   GERD without esophagitis   Benign hypertension   CKD stage 3a, GFR 45-59 ml/min (HCC)   Pressure injury of skin  Assessment and Plan: Acute on chronic diastolic CHF exacerbation: restart aldactone , losartan  & likely restart lasix  tomorrow as per cardio. Continue on metoprolol . Monitor I/Os. Likely secondary to medication noncompliance as pt was not taking lasix  at home    Demand ischemia: as per cardio. NSTEMI r/o. No further inpatient work-up recommended as per cardio    Dysuria: UA shows rare bacteria. No improvement w/ pyridium  x 3 doses. Continue on ceftin . Urine cx ordered   COPD: w/o exacerbation. Bronchodilators prn    HTN: continue on metoprolol , aldactone , losartan . Holding amlodipine     Chronic a. fib: as per cardio. Continue on metoprolol , xarelto    GERD: continue on PPI    CKDIIIa: Cr is labile. Avoid nephrotoxic meds   Pressure injury: continue w/ wound care.    Prostate cancer: continue on home dose of relugolix  for palliation. Need to f/u outpatient w/ uro         DVT prophylaxis: xarelto   Code Status: DNR Family Communication: discussed pt's care w/ pt's daughters and answered their questions  Disposition Plan: likely d/c to SNF  Level of care: Telemetry Cardiac  Status is: Inpatient Remains inpatient appropriate because: needs SNF placement     Consultants:  Cardio   Procedures:   Antimicrobials:   Subjective: Pt still c/o dysuria   Objective: Vitals:   08/15/23 2048 08/15/23 2332  08/16/23 0423 08/16/23 0500  BP: (!) 129/58 118/61 116/60   Pulse: (!) 54 (!) 54    Resp: 19 18 20    Temp: 98.4 F (36.9 C) 97.7 F (36.5 C) 98.2 F (36.8 C)   TempSrc: Oral Oral Oral   SpO2: 95% 94% 97%   Weight:    86.7 kg  Height:        Intake/Output Summary (Last 24 hours) at 08/16/2023 0842 Last data filed at 08/15/2023 2117 Gross per 24 hour  Intake 480 ml  Output 500 ml  Net -20 ml   Filed Weights   08/14/23 0326 08/15/23 0526 08/16/23 0500  Weight: 89.2 kg 84.4 kg 86.7 kg    Examination:  General exam: appears calm & comfortable   Respiratory system: diminished breath sounds b/l  Cardiovascular system: irregularly irregular  Gastrointestinal system: Abd is soft, NT, ND & hypoactive bowel sounds Central nervous system: alert & awake. Moves all extremities  Psychiatry: Judgement and insight appears at baseline. Flat mood and affect     Data Reviewed: I have personally reviewed following labs and imaging studies  CBC: Recent Labs  Lab 08/10/23 0615 08/11/23 0601 08/12/23 0407 08/13/23 0551  WBC 6.6 6.7 6.7 6.7  NEUTROABS  --  3.9 3.8  --   HGB 10.9* 11.5* 11.3* 12.1*  HCT 33.7* 34.4* 34.0* 36.7*  MCV 88.2 84.7 84.6 85.9  PLT 160 199 211 243   Basic Metabolic Panel: Recent Labs  Lab 08/12/23 0407 08/13/23 0551 08/14/23 0602 08/15/23 0632 08/16/23 0538  NA 136 134*  137 137 134*  K 3.6 3.5 4.1 3.7 3.8  CL 98 96* 97* 99 98  CO2 30 31 29 27 26   GLUCOSE 123* 114* 114* 109* 118*  BUN 34* 39* 47* 56* 53*  CREATININE 1.36* 1.44* 1.51* 1.68* 1.44*  CALCIUM  8.5* 8.6* 9.0 8.8* 8.8*   GFR: Estimated Creatinine Clearance: 36.4 mL/min (A) (by C-G formula based on SCr of 1.44 mg/dL (H)). Liver Function Tests: No results for input(s): "AST", "ALT", "ALKPHOS", "BILITOT", "PROT", "ALBUMIN" in the last 168 hours.  No results for input(s): "LIPASE", "AMYLASE" in the last 168 hours. No results for input(s): "AMMONIA" in the last 168 hours. Coagulation  Profile: No results for input(s): "INR", "PROTIME" in the last 168 hours. Cardiac Enzymes: No results for input(s): "CKTOTAL", "CKMB", "CKMBINDEX", "TROPONINI" in the last 168 hours. BNP (last 3 results) No results for input(s): "PROBNP" in the last 8760 hours. HbA1C: No results for input(s): "HGBA1C" in the last 72 hours. CBG: No results for input(s): "GLUCAP" in the last 168 hours. Lipid Profile: No results for input(s): "CHOL", "HDL", "LDLCALC", "TRIG", "CHOLHDL", "LDLDIRECT" in the last 72 hours. Thyroid Function Tests: No results for input(s): "TSH", "T4TOTAL", "FREET4", "T3FREE", "THYROIDAB" in the last 72 hours. Anemia Panel: No results for input(s): "VITAMINB12", "FOLATE", "FERRITIN", "TIBC", "IRON", "RETICCTPCT" in the last 72 hours. Sepsis Labs: No results for input(s): "PROCALCITON", "LATICACIDVEN" in the last 168 hours.  Recent Results (from the past 240 hours)  Resp panel by RT-PCR (RSV, Flu A&B, Covid) Anterior Nasal Swab     Status: None   Collection Time: 08/08/23  9:07 AM   Specimen: Anterior Nasal Swab  Result Value Ref Range Status   SARS Coronavirus 2 by RT PCR NEGATIVE NEGATIVE Final    Comment: (NOTE) SARS-CoV-2 target nucleic acids are NOT DETECTED.  The SARS-CoV-2 RNA is generally detectable in upper respiratory specimens during the acute phase of infection. The lowest concentration of SARS-CoV-2 viral copies this assay can detect is 138 copies/mL. A negative result does not preclude SARS-Cov-2 infection and should not be used as the sole basis for treatment or other patient management decisions. A negative result may occur with  improper specimen collection/handling, submission of specimen other than nasopharyngeal swab, presence of viral mutation(s) within the areas targeted by this assay, and inadequate number of viral copies(<138 copies/mL). A negative result must be combined with clinical observations, patient history, and  epidemiological information. The expected result is Negative.  Fact Sheet for Patients:  BloggerCourse.com  Fact Sheet for Healthcare Providers:  SeriousBroker.it  This test is no t yet approved or cleared by the [place]  FDA and  has been authorized for detection and/or diagnosis of SARS-CoV-2 by FDA under an Emergency Use Authorization (EUA). This EUA will remain  in effect (meaning this test can be used) for the duration of the COVID-19 declaration under Section 564(b)(1) of the Act, 21 U.S.C.section 360bbb-3(b)(1), unless the authorization is terminated  or revoked sooner.       Influenza A by PCR NEGATIVE NEGATIVE Final   Influenza B by PCR NEGATIVE NEGATIVE Final    Comment: (NOTE) The Xpert Xpress SARS-CoV-2/FLU/RSV plus assay is intended as an aid in the diagnosis of influenza from Nasopharyngeal swab specimens and should not be used as a sole basis for treatment. Nasal washings and aspirates are unacceptable for Xpert Xpress SARS-CoV-2/FLU/RSV testing.  Fact Sheet for Patients: BloggerCourse.com  Fact Sheet for Healthcare Providers: SeriousBroker.it  This test is not yet approved or cleared by the [place] FDA and has been authorized for detection and/or diagnosis of SARS-CoV-2 by FDA under an Emergency Use Authorization (EUA). This EUA will remain in effect (meaning this test can be used) for the duration of the COVID-19 declaration under Section 564(b)(1) of the Act, 21 U.S.C. section 360bbb-3(b)(1), unless the authorization is terminated or revoked.     Resp Syncytial Virus by PCR NEGATIVE NEGATIVE Final    Comment: (NOTE) Fact Sheet for Patients: BloggerCourse.com  Fact Sheet for Healthcare Providers: SeriousBroker.it  This test is not yet approved or cleared by the [place]  FDA and has been  authorized for detection and/or diagnosis of SARS-CoV-2 by FDA under an Emergency Use Authorization (EUA). This EUA will remain in effect (meaning this test can be used) for the duration of the COVID-19 declaration under Section 564(b)(1) of the Act, 21 U.S.C. section 360bbb-3(b)(1), unless the authorization is terminated or revoked.  Performed at [facility], [address], [place], [place] [address]          Radiology Studies: No results found.      Scheduled Meds:  cefUROXime   250 mg Oral BID WC   docusate sodium   100 mg Oral BID   fluticasone   1 spray Each Nare BID   guaiFENesin   600 mg Oral BID   losartan   12.5 mg Oral Daily   metoprolol  succinate  12.5 mg Oral Daily   multivitamin  1 tablet Oral BID   pantoprazole   40 mg Oral Daily   polyethylene glycol  17 g Oral Daily   potassium chloride   10 mEq Oral Daily   relugolix   120 mg Oral Daily   Rivaroxaban   15 mg Oral Q supper   sodium chloride  flush  3 mL Intravenous Q12H   spironolactone   12.5 mg Oral Daily   Continuous Infusions:   LOS: 8 days      Alphonsus Jeans, MD [facility] Pager [phone]  If 7PM-7AM, please contact night-coverage [email or web address]  08/16/2023, 8:42 AM

## 2023-08-16 NOTE — Progress Notes (Signed)
 OT Cancellation Note  Patient Details Name: Dennis Savage MRN: [id number] DOB: 05-Apr-1930   Cancelled Treatment:    Reason Eval/Treat Not Completed: Medical issues which prohibited therapy.Pt reports chest pain and RN giving meds for chest pain. OT to hold at this time until medically appropriate.   George Kinder, MS, OTR/L , CBIS ascom [phone]  08/16/23, 2:44 PM

## 2023-08-16 NOTE — Progress Notes (Signed)
 [facility] CLINIC CARDIOLOGY PROGRESS NOTE       Patient ID: Dennis Savage MRN: [id number] DOB/AGE: December 28, 1929 89 y.o.  Admit date: 08/08/2023 Referring Physician Dr. Pablo Boards Primary Physician Little Riff, MD  Primary Cardiologist Bary Likes (last seen in 2023) Reason for Consultation AoC HFpEF  HPI: Dennis Savage is a 89 y.o. male  with a past medical history of chronic HFpEF, chronic atrial fibrillation on Xarelto , COPD who presented to the ED on 08/08/2023 for worsening SOB and LE edema. Cardiology was consulted for further evaluation.   Interval history: -Patient seen and examined this AM -Did not complain of dysuria this AM. -He reports breathing is stable today. Still on O2 but this has been weaned.  -Denies CP or palpitations.   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   Dizziness 02/25/2015   GERD (gastroesophageal reflux disease)    Heart murmur    Hypertension    Interstitial lung disease (HCC) 10/13/2013   LBP (low back pain) 11/03/2010   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   Peripheral vascular disease (HCC) 11/03/2010   Retinal hemorrhage 03/16/2014    Past Surgical History:  Procedure Laterality Date   APPENDECTOMY     BACK SURGERY     CARPAL TUNNEL RELEASE Bilateral    EYE SURGERY Bilateral    Cataract Extraction with IOL   HERNIA REPAIR     Umbilical Hernia X 3   JOINT REPLACEMENT     bilat knees   NASAL SINUS  SURGERY     REPLACEMENT TOTAL KNEE Bilateral    SHOULDER SURGERY Right    TONSILLECTOMY     TOTAL HIP ARTHROPLASTY Right 10/12/2015   Procedure: TOTAL HIP ARTHROPLASTY;  Surgeon: Arlyne Lame, MD;  Location: [facility] ORS;  Service: Orthopedics;  Laterality: Right;   TOTAL HIP ARTHROPLASTY Left 02/15/2023   Procedure: TOTAL HIP ARTHROPLASTY;  Surgeon: Arlyne Lame, MD;  Location: [facility] ORS;  Service: Orthopedics;  Laterality: Left;    Medications Prior to Admission  Medication Sig Dispense Refill Last Dose/Taking   acetaminophen  (TYLENOL ) 325 MG tablet Take 650 mg by mouth every 6 (six) hours as needed.   Taking As Needed   albuterol  (VENTOLIN  HFA) 108 (90 Base) MCG/ACT inhaler Inhale 2 puffs into the lungs every 6 (six) hours as needed for wheezing or shortness of breath.   Taking As Needed   amLODipine  (NORVASC ) 10 MG tablet Take 10 mg by mouth daily.   08/07/2023   Calcium  Carb-Cholecalciferol  (CALCIUM  600 + D PO) Take 1 tablet by mouth daily.   08/07/2023   cholecalciferol  (VITAMIN D3) 25 MCG (1000 UNIT) tablet Take 1,000 Units by mouth daily.   08/07/2023   docusate sodium  (COLACE) 100 MG capsule Take 100 mg by mouth 2 (two) times daily.   08/07/2023   fluticasone  (FLONASE ) 50 MCG/ACT nasal spray Place 1 spray into both nostrils 2 (two) times daily.   08/07/2023   guaiFENesin  (MUCINEX ) 600 MG 12  hr tablet Take 600 mg by mouth 2 (two) times daily.   Taking   hydrOXYzine  (ATARAX ) 10 MG tablet Take 10 mg by mouth at bedtime as needed for itching.   Taking As Needed   Lactulose  20 GM/30ML SOLN Take 30 mLs (20 g total) by mouth daily as needed. 450 mL 0 Taking As Needed   Multiple Vitamins-Minerals (PRESERVISION AREDS 2 PO) Take 1 capsule by mouth 2 (two) times daily.   08/07/2023   Omega-3 Fatty Acids (FISH OIL) 300 MG CAPS Take 300 mg by mouth daily.   08/07/2023   omeprazole (PRILOSEC) 20 MG capsule Take 20 mg by mouth daily.   Past Week   polyethylene glycol (MIRALAX  / GLYCOLAX ) 17 g packet Take 17  g by mouth daily. 14 each 0 Taking   potassium chloride  (KLOR-CON ) 10 MEQ tablet Take by mouth.   Past Week   relugolix  (ORGOVYX ) 120 MG tablet Take 1 tablet (120 mg total) by mouth daily. 30 tablet 11 08/07/2023   rivaroxaban  (XARELTO ) 20 MG TABS tablet Take 20 mg by mouth daily with supper. (Patient not taking: Reported on 08/08/2023)   Not Taking   Social History   Socioeconomic History   Marital status: Single    Spouse name: Not on file   Number of children: Not on file   Years of education: Not on file   Highest education level: Not on file  Occupational History   Not on file  Tobacco Use   Smoking status: Former    Current packs/day: 1.00    Types: Cigarettes   Smokeless tobacco: Never  Vaping Use   Vaping status: Never Used  Substance and Sexual Activity   Alcohol use: No   Drug use: No   Sexual activity: Not on file  Other Topics Concern   Not on file  Social History Narrative   Not on file   Social Drivers of Health   Financial Resource Strain: Low Risk  (06/07/2023)   [facility]   Overall Financial Resource Strain (CARDIA)    Difficulty of Paying Living Expenses: Not hard at all  Food Insecurity: No Food Insecurity (08/10/2023)   Hunger Vital Sign    Worried About Running Out of Food in the Last Year: Never true    Ran Out of Food in the Last Year: Never true  Transportation Needs: No Transportation Needs (08/10/2023)   PRAPARE - Administrator, Civil Service (Medical): No    Lack of Transportation (Non-Medical): No  Physical Activity: Not on file  Stress: Not on file  Social Connections: Moderately Isolated (08/10/2023)   Social Connection and Isolation Panel [NHANES]    Frequency of Communication with Friends and Family: More than three times a week    Frequency of Social Gatherings with Friends and Family: More than three times a week    Attends Religious Services: More than 4 times per year    Active Member of [employer] or  Organizations: No    Attends Banker Meetings: Never    Marital Status: Divorced  Catering manager Violence: Not At Risk (08/10/2023)   Humiliation, Afraid, Rape, and Kick questionnaire    Fear of Current or Ex-Partner: No    Emotionally Abused: No    Physically Abused: No    Sexually Abused: No    Family History  Problem Relation Age of Onset   Bladder Cancer Neg Hx    Kidney cancer Neg Hx    Prostate  cancer Neg Hx      Vitals:   08/15/23 2048 08/15/23 2332 08/16/23 0423 08/16/23 0500  BP: (!) 129/58 118/61 116/60   Pulse: (!) 54 (!) 54    Resp: 19 18 20    Temp: 98.4 F (36.9 C) 97.7 F (36.5 C) 98.2 F (36.8 C)   TempSrc: Oral Oral Oral   SpO2: 95% 94% 97%   Weight:    86.7 kg  Height:        PHYSICAL EXAM General: Ill-appearing elderly male, well nourished, in no acute distress. HEENT: Normocephalic and atraumatic. Neck: No JVD.  Lungs: Normal respiratory effort on 2L .  Diminished bilaterally Heart: Irregularly irregular, controlled rate. Normal S1 and S2 without gallops or murmurs.  Abdomen: Non-distended appearing.  Msk: Normal strength and tone for age. Extremities: Warm and well perfused. No clubbing, cyanosis.  No edema bilaterally.  Neuro: Alert and oriented X 3. Psych: Answers questions appropriately.   Labs: Basic Metabolic Panel: Recent Labs    08/15/23 0632 08/16/23 0538  NA 137 134*  K 3.7 3.8  CL 99 98  CO2 27 26  GLUCOSE 109* 118*  BUN 56* 53*  CREATININE 1.68* 1.44*  CALCIUM  8.8* 8.8*   Liver Function Tests: No results for input(s): "AST", "ALT", "ALKPHOS", "BILITOT", "PROT", "ALBUMIN" in the last 72 hours.  No results for input(s): "LIPASE", "AMYLASE" in the last 72 hours. CBC: No results for input(s): "WBC", "NEUTROABS", "HGB", "HCT", "MCV", "PLT" in the last 72 hours.  Cardiac Enzymes: No results for input(s): "CKTOTAL", "CKMB", "CKMBINDEX", "TROPONINIHS" in the last 72 hours.   BNP: No results for input(s):  "BNP" in the last 72 hours.  D-Dimer: No results for input(s): "DDIMER" in the last 72 hours. Hemoglobin A1C: No results for input(s): "HGBA1C" in the last 72 hours. Fasting Lipid Panel: No results for input(s): "CHOL", "HDL", "LDLCALC", "TRIG", "CHOLHDL", "LDLDIRECT" in the last 72 hours. Thyroid Function Tests: No results for input(s): "TSH", "T4TOTAL", "T3FREE", "THYROIDAB" in the last 72 hours.  Invalid input(s): "FREET3" Anemia Panel: No results for input(s): "VITAMINB12", "FOLATE", "FERRITIN", "TIBC", "IRON", "RETICCTPCT" in the last 72 hours.   Radiology: ECHOCARDIOGRAM COMPLETE Result Date: 08/10/2023    ECHOCARDIOGRAM REPORT   Patient Name:   MANOAH RITSEMA Date of Exam: 08/10/2023 Medical Rec #:  [id number]      Height:       69.0 in Accession #:    [id number]     Weight:       203.7 lb Date of Birth:  10/03/1929      BSA:          2.082 m Patient Age:    89 years       BP:           128/64 mmHg Patient Gender: M              HR:           68 bpm. Exam Location:  Inpatient Procedure: 2D Echo (Both Spectral and Color Flow Doppler were utilized during            procedure). Indications:     congestive heart failure  History:         Patient has prior history of Echocardiogram examinations, most                  recent 06/18/2021. Chronic kidney disease and COPD,  Arrythmias:Atrial Fibrillation, Signs/Symptoms:Shortness of                  Breath; Risk Factors:Former Smoker and Hypertension.  Sonographer:     Dione Franks RDCS Referring Phys:  [id number] Marshayla Mitschke Diagnosing Phys: Constancia Delton MD IMPRESSIONS  1. Left ventricular ejection fraction, by estimation, is 50 to 55%. The left ventricle has low normal function. The left ventricle has no regional wall motion abnormalities. There is moderate concentric left ventricular hypertrophy. Left ventricular diastolic parameters are indeterminate.  2. Right ventricular systolic function is normal. The right ventricular size  is normal. There is normal pulmonary artery systolic pressure.  3. Left atrial size was severely dilated.  4. Right atrial size was severely dilated.  5. The mitral valve is normal in structure. Mild mitral valve regurgitation.  6. The aortic valve is tricuspid. Aortic valve regurgitation is mild. Aortic valve sclerosis/calcification is present, without any evidence of aortic stenosis.  7. Aortic dilatation noted. There is mild dilatation of the aortic root, measuring 40 mm.  8. The inferior vena cava is normal in size with greater than 50% respiratory variability, suggesting right atrial pressure of 3 mmHg. FINDINGS  Left Ventricle: Left ventricular ejection fraction, by estimation, is 50 to 55%. The left ventricle has low normal function. The left ventricle has no regional wall motion abnormalities. The left ventricular internal cavity size was normal in size. There is moderate concentric left ventricular hypertrophy. Left ventricular diastolic parameters are indeterminate. Right Ventricle: The right ventricular size is normal. No increase in right ventricular wall thickness. Right ventricular systolic function is normal. There is normal pulmonary artery systolic pressure. The tricuspid regurgitant velocity is 2.81 m/s, and  with an assumed right atrial pressure of 3 mmHg, the estimated right ventricular systolic pressure is 34.6 mmHg. Left Atrium: Left atrial size was severely dilated. Right Atrium: Right atrial size was severely dilated. Pericardium: Trivial pericardial effusion is present. Mitral Valve: The mitral valve is normal in structure. Mild mitral valve regurgitation. Tricuspid Valve: The tricuspid valve is normal in structure. Tricuspid valve regurgitation is mild. Aortic Valve: The aortic valve is tricuspid. Aortic valve regurgitation is mild. Aortic valve sclerosis/calcification is present, without any evidence of aortic stenosis. Pulmonic Valve: The pulmonic valve was normal in structure. Pulmonic  valve regurgitation is mild. Aorta: Aortic dilatation noted. There is mild dilatation of the aortic root, measuring 40 mm. Venous: The inferior vena cava is normal in size with greater than 50% respiratory variability, suggesting right atrial pressure of 3 mmHg. IAS/Shunts: No atrial level shunt detected by color flow Doppler.  LEFT VENTRICLE PLAX 2D LVIDd:         5.30 cm LVIDs:         3.90 cm LV PW:         1.50 cm LV IVS:        1.50 cm LVOT diam:     2.50 cm LV SV:         71 LV SV Index:   34 LVOT Area:     4.91 cm  RIGHT VENTRICLE             IVC RV Basal diam:  3.60 cm     IVC diam: 2.00 cm RV S prime:     11.40 cm/s LEFT ATRIUM              Index        RIGHT ATRIUM  Index LA diam:        6.00 cm  2.88 cm/m   RA Area:     32.80 cm LA Vol (A2C):   139.0 ml 66.76 ml/m  RA Volume:   109.00 ml 52.35 ml/m LA Vol (A4C):   130.0 ml 62.43 ml/m LA Biplane Vol: 139.0 ml 66.76 ml/m  AORTIC VALVE LVOT Vmax:   76.50 cm/s LVOT Vmean:  49.300 cm/s LVOT VTI:    0.144 m  AORTA Ao Root diam: 4.00 cm Ao Asc diam:  3.80 cm TRICUSPID VALVE TR Peak grad:   31.6 mmHg TR Vmax:        281.00 cm/s  SHUNTS Systemic VTI:  0.14 m Systemic Diam: 2.50 cm Constancia Delton MD Electronically signed by Constancia Delton MD Signature Date/Time: 08/10/2023/2:56:08 PM    Final    DG Chest Port 1 View Result Date: 08/08/2023 CLINICAL DATA:  Shortness of breath EXAM: PORTABLE CHEST 1 VIEW COMPARISON:  04/28/2023 FINDINGS: Cardiac shadow is enlarged but stable. Aortic calcifications are again seen. Patchy airspace opacity is noted in the upper lobes bilaterally. Stable calcified granuloma is noted in the left mid lung. Small effusions are seen bilaterally. IMPRESSION: Multifocal infiltrate with small effusions bilaterally. Electronically Signed   By: Violeta Grey M.D.   On: 08/08/2023 09:49    ECHO as above  TELEMETRY reviewed by me 08/16/2023: Atrial fibrillation PVCs rate 50-60s  EKG reviewed by me: atrial fibrillation  PVCs, baseline artifact, rate 90 bpm  Data reviewed by me 08/16/2023: last 24h vitals tele labs imaging I/O ED provider note, admission H&P, hospitalist progress note  Principal Problem:   Acute on chronic diastolic CHF (congestive heart failure) (HCC) Active Problems:   COPD (chronic obstructive pulmonary disease) (HCC)   Benign hypertension   Essential hypertension   Paroxysmal atrial fibrillation (HCC)   GERD without esophagitis   Acute respiratory failure with hypoxia (HCC)   CKD stage 3a, GFR 45-59 ml/min (HCC)   NSTEMI (non-ST elevated myocardial infarction) (HCC)   Pressure injury of skin    ASSESSMENT AND PLAN:  Dennis Savage is a 89 y.o. male  with a past medical history of chronic HFpEF, chronic atrial fibrillation on Xarelto , COPD who presented to the ED on 08/08/2023 for worsening SOB and LE edema. Cardiology was consulted for further evaluation.   # Acute hypoxic respiratory failure # Acute on chronic HFpEF # Demand ischemia # Chronic atrial fibrillation # Frequent PVCs Patient brought to the ED by EMS due to respiratory distress.  Per chart review family reported the patient had had worsening shortness of breath and lower extremity edema prior to coming to the ED.  BNP elevated at 1400.  Troponins 395 > 483.  Placed on BiPAP by EMS.  Started on IV Lasix  in the ED. Echo this admission with E 50-55%, no rWMAs, moderate LVH, mild MR, mild AR, mild aortic root dilatation 4.0 cm. -Resume losartan , spironolactone  today. Likely restart lasix  tomorrow. -Continue metoprolol  succinate 12.5 mg daily. -Strict intake/output, monitor renal function and electrolytes closely with diuresis. -Continue Xarelto  20 mg daily.  Patient reportedly had not been taking this at home. -Mildly elevated troponin most consistent with demand/supply mismatch and not ACS.  No plan for further cardiac diagnostics at this time. -Continue to wean O2 as able.  -Palliative has been consulted by primary  team.  Patient is stable for DC from cardiac perspective today vs tomorrow. Recommend resuming lasix  40 mg daily on 5/3.  This patient's plan of  care was discussed and created with Dr. Parks Bollman and he is in agreement.  Signed: Hamp Levine, PA-C  08/16/2023, 8:08 AM [facility]

## 2023-08-16 NOTE — Progress Notes (Signed)
 PT Cancellation Note  Patient Details Name: Dennis Savage MRN: [id number] DOB: August 05, 1929   Cancelled Treatment:    Reason Eval/Treat Not Completed: Medical issues which prohibited therapy. Upon entry to room, pt with RN. Pt reports chest pain and RN giving meds for chest pain. PT to hold at this time until medically appropriate.    Marc Senior. Fairly IV, PT, DPT Physical Therapist- [place]  [facility]  08/16/2023, 2:37 PM

## 2023-08-16 NOTE — Progress Notes (Signed)
 Palliative Care Progress Note, Assessment & Plan   Patient Name: Dennis Savage       Date: 08/16/2023 DOB: [birth year]  Age: 89 y.o. MRN#: [id number] Attending Physician: Alphonsus Jeans, MD Primary Care Physician: Little Riff, MD Admit Date: 08/08/2023  Subjective: Patient is lying in bed, asleep, but easily awakens to my presence.  He is alert, oriented x 4, and able to make his wishes known.  No family or friends are present during my visit.  HPI: 89 y.o. male  with past medical history of proximal A-fib (Xarelto ), HFpEF, COPD, prostate cancer, CKD (stage III), HTN, GERD, and subdural hygroma admitted on 08/08/2023 with shortness of breath.  Patient had recent admission at [facility] for the same (March 2025).   Patient is being treated for acute on chronic HFrEF, acute respiratory failure with volume overload, NSTEMI, and COPD.   PMT was consulted to support patient and family with goals of care discussions.  Summary of counseling/coordination of care: Extensive chart review completed prior to meeting patient including labs, vital signs, imaging, progress notes, orders, and available advanced directive documents from current and previous encounters.   After reviewing the patient's chart and assessing the patient at bedside, I spoke with patient in regards to symptom management and goals of care.   Symptoms discussed.  Patient continues to endorse that he has a burning and painful sensation with urination.  He endorses pain is not present when he is not urinating.  He shares that he has had this issue continuously in the past and prior to admission.  However, he endorses it is more intense now.  Additionally, he endorses that he often times feels as if he has to urinate and then "nothing comes out". MD  aware. Urine culture pending.  Attempt to elicit values and goals reported to the patient.  He shared he try to get out of the hospital.  However, he notes that his daughter is working with the team to decide where he will be transferred to.  He shares that he believes he should be able to stay in the hospital until he can independently transfer out of the bed.  Discussed that patients remain in the hospital for medical treatment and that rehab can help facilitate increased mobility and functional status.  He showed understanding and deferred decision making for discharge planning to his daughter.  I assured him TOC is following closely.  PMT remains available to patient throughout his hospitalization.  I am off until next week.  Please utilize Amion for palliative provider showed acute palliative needs arise before my return.  Physical Exam Vitals reviewed.  Constitutional:      General: He is not in acute distress.    Appearance: He is normal weight.  HENT:     Head: Normocephalic.     Mouth/Throat:     Mouth: Mucous membranes are moist.  Eyes:     Pupils: Pupils are equal, round, and reactive to light.  Pulmonary:     Effort: Pulmonary effort is normal.  Abdominal:     Palpations: Abdomen is soft.  Musculoskeletal:     Comments: Generalized weakness  Skin:    General: Skin is warm  and dry.  Neurological:     Mental Status: He is alert and oriented to person, place, and time.  Psychiatric:        Mood and Affect: Mood normal.        Behavior: Behavior normal.        Thought Content: Thought content normal.        Judgment: Judgment normal.             Total Time 35 minutes   Time spent includes: Detailed review of medical records (labs, imaging, vital signs), medically appropriate exam (mental status, respiratory, cardiac, skin), discussed with treatment team, counseling and educating patient, family and staff, documenting clinical information, medication management and  coordination of care.  Judeen Nose L. Rebbeca Campi, DNP, FNP-BC Palliative Medicine Team

## 2023-08-16 NOTE — TOC Progression Note (Addendum)
 Transition of Care [facility]) - Progression Note    Patient Details  Name: Dennis Savage MRN: [id number] Date of Birth: October 09, 1929  [facility] CM/SW Contact  Odilia Bennett, LCSW Phone Number: 08/16/2023, 12:34 PM  Clinical Narrative:   Left voicemail for [facility] social worker (ext [phone]) to see if patient is eligible for SNF placement with VA benefits.  3:07 pm: Received call back from [facility]. Patient is not service connected and therefore not eligible for a [facility] SNF. Patient was at [facility] 4/3-4/18 (15 days). He does have a secondary insurance to cover copays. CSW left voicemail for daughter.  Expected Discharge Plan and Services                                               Social Determinants of Health (SDOH) Interventions SDOH Screenings   Food Insecurity: No Food Insecurity (08/10/2023)  Housing: Low Risk  (08/10/2023)  Transportation Needs: No Transportation Needs (08/10/2023)  Utilities: Not At Risk (08/10/2023)  Depression (PHQ2-9): Low Risk  (06/30/2021)  Financial Resource Strain: Low Risk  (06/07/2023)   Received from [facility]  Social Connections: Moderately Isolated (08/10/2023)  Tobacco Use: Medium Risk (08/08/2023)    Readmission Risk Interventions    04/18/2023    1:48 PM  Readmission Risk Prevention Plan  Transportation Screening Complete  Medication Review (RN Care Manager) Complete  HRI or Home Care Consult Complete  Palliative Care Screening Not Applicable  Skilled Nursing Facility Complete

## 2023-08-17 DIAGNOSIS — I5033 Acute on chronic diastolic (congestive) heart failure: Secondary | ICD-10-CM | POA: Diagnosis not present

## 2023-08-17 DIAGNOSIS — I480 Paroxysmal atrial fibrillation: Secondary | ICD-10-CM | POA: Diagnosis not present

## 2023-08-17 LAB — BASIC METABOLIC PANEL WITH GFR
Anion gap: 6 (ref 5–15)
BUN: 56 mg/dL — ABNORMAL HIGH (ref 8–23)
CO2: 29 mmol/L (ref 22–32)
Calcium: 8.5 mg/dL — ABNORMAL LOW (ref 8.9–10.3)
Chloride: 97 mmol/L — ABNORMAL LOW (ref 98–111)
Creatinine, Ser: 1.44 mg/dL — ABNORMAL HIGH (ref 0.61–1.24)
GFR, Estimated: 46 mL/min — ABNORMAL LOW (ref >60)
Glucose, Bld: 124 mg/dL — ABNORMAL HIGH (ref 70–99)
Potassium: 4.4 mmol/L (ref 3.5–5.1)
Sodium: 132 mmol/L — ABNORMAL LOW (ref 135–145)

## 2023-08-17 LAB — URINE CULTURE: Culture: <10000 — AB

## 2023-08-17 LAB — CBC
HCT: 34.6 % — ABNORMAL LOW (ref 39.0–52.0)
Hemoglobin: 11 g/dL — ABNORMAL LOW (ref 13.0–17.0)
MCH: 27.6 pg (ref 26.0–34.0)
MCHC: 31.8 g/dL (ref 30.0–36.0)
MCV: 86.9 fL (ref 80.0–100.0)
Platelets: 328 10*3/uL (ref 150–400)
RBC: 3.98 MIL/uL — ABNORMAL LOW (ref 4.22–5.81)
RDW: 15.8 % — ABNORMAL HIGH (ref 11.5–15.5)
WBC: 7.4 10*3/uL (ref 4.0–10.5)
nRBC: 0 % (ref 0.0–0.2)

## 2023-08-17 NOTE — Plan of Care (Signed)

## 2023-08-17 NOTE — Progress Notes (Signed)
 [facility] Cardiology  SUBJECTIVE: Patient laying in bed, reports less shortness of breath   Vitals:   08/16/23 2032 08/17/23 0001 08/17/23 0441 08/17/23 0847  BP: (!) 101/52 (!) 117/55 107/72 116/61  Pulse: (!) 52 (!) 54 (!) 53 61  Resp: 20 18 20 19   Temp: 98.1 F (36.7 C) 97.8 F (36.6 C) 99.2 F (37.3 C) 98.7 F (37.1 C)  TempSrc:    Oral  SpO2: 95% 96% 99% 97%  Weight:      Height:         Intake/Output Summary (Last 24 hours) at 08/17/2023 1610 Last data filed at 08/17/2023 0100 Gross per 24 hour  Intake 240 ml  Output 400 ml  Net -160 ml      PHYSICAL EXAM  General: Well developed, well nourished, in no acute distress HEENT:  Normocephalic and atramatic Neck:  No JVD.  Lungs: Clear bilaterally to auscultation and percussion. Heart: HRRR . Normal S1 and S2 without gallops or murmurs.  Abdomen: Bowel sounds are positive, abdomen soft and non-tender  Msk:  Back normal, normal gait. Normal strength and tone for age. Extremities: No clubbing, cyanosis or edema.   Neuro: Alert and oriented X 3. Psych:  Good affect, responds appropriately   LABS: Basic Metabolic Panel: Recent Labs    08/16/23 0538 08/17/23 0310  NA 134* 132*  K 3.8 4.4  CL 98 97*  CO2 26 29  GLUCOSE 118* 124*  BUN 53* 56*  CREATININE 1.44* 1.44*  CALCIUM  8.8* 8.5*   Liver Function Tests: No results for input(s): "AST", "ALT", "ALKPHOS", "BILITOT", "PROT", "ALBUMIN" in the last 72 hours. No results for input(s): "LIPASE", "AMYLASE" in the last 72 hours. CBC: Recent Labs    08/17/23 0310  WBC 7.4  HGB 11.0*  HCT 34.6*  MCV 86.9  PLT 328   Cardiac Enzymes: No results for input(s): "CKTOTAL", "CKMB", "CKMBINDEX", "TROPONINI" in the last 72 hours. BNP: Invalid input(s): "POCBNP" D-Dimer: No results for input(s): "DDIMER" in the last 72 hours. Hemoglobin A1C: No results for input(s): "HGBA1C" in the last 72 hours. Fasting Lipid Panel: No results for input(s): "CHOL", "HDL", "LDLCALC",  "TRIG", "CHOLHDL", "LDLDIRECT" in the last 72 hours. Thyroid Function Tests: No results for input(s): "TSH", "T4TOTAL", "T3FREE", "THYROIDAB" in the last 72 hours.  Invalid input(s): "FREET3" Anemia Panel: No results for input(s): "VITAMINB12", "FOLATE", "FERRITIN", "TIBC", "IRON", "RETICCTPCT" in the last 72 hours.  No results found.   Echo LVEF 50-55%, mild mitral regurgitation  TELEMETRY: Atrial fibrillation 57 bpm:  ASSESSMENT AND PLAN:  Principal Problem:   Acute on chronic diastolic CHF (congestive heart failure) (HCC) Active Problems:   COPD (chronic obstructive pulmonary disease) (HCC)   Benign hypertension   Essential hypertension   Paroxysmal atrial fibrillation (HCC)   GERD without esophagitis   Acute respiratory failure with hypoxia (HCC)   CKD stage 3a, GFR 45-59 ml/min (HCC)   NSTEMI (non-ST elevated myocardial infarction) (HCC)   Pressure injury of skin    1.  Acute HFpEF, BNP 1057.8, LVEF 50-55%, clinically improved after initial diuresis 2.  Elevated troponin (395, 43, 129), in the absence of chest pain or new ECG changes, likely demand supply ischemia 3.  Chronic atrial fibrillation, rate controlled, on Xarelto  for stroke prevention 4.  Frequent PVCs 5.  CKD stage IIIa, BUN/creatinine 56 and 1.44 on 08/17/2023  Recommendations  1.  Agree with current therapy 2.  Continue Xarelto  for stroke prevention 3.  Continue to hold diuretics for now, may consider  restarting tomorrow 4.  Continue good medical management (metoprolol  succinate, losartan , spironolactone ) 5.  Defer further cardiac diagnostics at this time   Percival Brace, MD, PhD, [facility] 08/17/2023 8:52 AM

## 2023-08-17 NOTE — Progress Notes (Signed)
 PROGRESS NOTE    Dennis Savage  ZOX:[id number] DOB: 03-Jun-1929 DOA: 08/08/2023 PCP: Little Riff, MD   Assessment & Plan:   Principal Problem:   Acute on chronic diastolic CHF (congestive heart failure) (HCC) Active Problems:   Acute respiratory failure with hypoxia (HCC)   COPD (chronic obstructive pulmonary disease) (HCC)   NSTEMI (non-ST elevated myocardial infarction) (HCC)   Essential hypertension   Paroxysmal atrial fibrillation (HCC)   GERD without esophagitis   Benign hypertension   CKD stage 3a, GFR 45-59 ml/min (HCC)   Pressure injury of skin  Assessment and Plan: Acute on chronic diastolic CHF exacerbation: holding lasix  again today. Continue on losartan , metoprolol  & aldactone . Monitor I/Os. Likely secondary to medication noncompliance as pt was not taking lasix  at home    Demand ischemia: as per cardio. NSTEMI r/o. No further inpatient work-up recommended as per cardio    Dysuria: UA shows rare bacteria. No improvement w/ pyridium  x 3 doses. Continue on ceftin . Urine cx is pending.   COPD: w/o exacerbation. Bronchodilators    HTN: continue on losartan , metoprolol , aldactone . Holding amlodipine    Chronic a. fib: as per cardio. Continue on metoprolol , xarelto  as per cardio   GERD: continue on PPI    CKDIIIa:  Cr is stable. Avoid nephrotoxic meds    Pressure injury: continue w/ wound care    Prostate cancer: continue on home dose of relugolix  for palliation. Need to f/u outpatient w/ uro         DVT prophylaxis: xarelto   Code Status: DNR Family Communication: called pt's daughter, [name], no answer so I left a voicemail  Disposition Plan: likely d/c to SNF  Level of care: Telemetry Cardiac  Status is: Inpatient Remains inpatient appropriate because: needs SNF placement     Consultants:  Cardio   Procedures:   Antimicrobials:   Subjective: Pt still c/o dysuria   Objective: Vitals:   08/16/23 1513 08/16/23 2032 08/17/23 0001 08/17/23  0441  BP: 109/60 (!) 101/52 (!) 117/55 107/72  Pulse: (!) 55 (!) 52 (!) 54 (!) 53  Resp: (!) 21 20 18 20   Temp: 98.6 F (37 C) 98.1 F (36.7 C) 97.8 F (36.6 C) 99.2 F (37.3 C)  TempSrc:      SpO2: 91% 95% 96% 99%  Weight:      Height:        Intake/Output Summary (Last 24 hours) at 08/17/2023 0843 Last data filed at 08/17/2023 0100 Gross per 24 hour  Intake 240 ml  Output 400 ml  Net -160 ml   Filed Weights   08/14/23 0326 08/15/23 0526 08/16/23 0500  Weight: 89.2 kg 84.4 kg 86.7 kg    Examination:  General exam: appears comfortable   Respiratory system: decreased breath sounds b/l  Cardiovascular system: irregularly irregular  Gastrointestinal system: abd is soft, NT, ND & hyponatremia  Central nervous system: alert & awake. Moves all extremities  Psychiatry: Judgement and insight appears at baseline. Flat mood and affect     Data Reviewed: I have personally reviewed following labs and imaging studies  CBC: Recent Labs  Lab 08/11/23 0601 08/12/23 0407 08/13/23 0551 08/17/23 0310  WBC 6.7 6.7 6.7 7.4  NEUTROABS 3.9 3.8  --   --   HGB 11.5* 11.3* 12.1* 11.0*  HCT 34.4* 34.0* 36.7* 34.6*  MCV 84.7 84.6 85.9 86.9  PLT 199 211 243 328   Basic Metabolic Panel: Recent Labs  Lab 08/13/23 0551 08/14/23 0602 08/15/23 8119 08/16/23 1478 08/17/23 0310  NA 134* 137 137 134* 132*  K 3.5 4.1 3.7 3.8 4.4  CL 96* 97* 99 98 97*  CO2 31 29 27 26 29   GLUCOSE 114* 114* 109* 118* 124*  BUN 39* 47* 56* 53* 56*  CREATININE 1.44* 1.51* 1.68* 1.44* 1.44*  CALCIUM  8.6* 9.0 8.8* 8.8* 8.5*   GFR: Estimated Creatinine Clearance: 36.4 mL/min (A) (by C-G formula based on SCr of 1.44 mg/dL (H)). Liver Function Tests: No results for input(s): "AST", "ALT", "ALKPHOS", "BILITOT", "PROT", "ALBUMIN" in the last 168 hours.  No results for input(s): "LIPASE", "AMYLASE" in the last 168 hours. No results for input(s): "AMMONIA" in the last 168 hours. Coagulation Profile: No  results for input(s): "INR", "PROTIME" in the last 168 hours. Cardiac Enzymes: No results for input(s): "CKTOTAL", "CKMB", "CKMBINDEX", "TROPONINI" in the last 168 hours. BNP (last 3 results) No results for input(s): "PROBNP" in the last 8760 hours. HbA1C: No results for input(s): "HGBA1C" in the last 72 hours. CBG: No results for input(s): "GLUCAP" in the last 168 hours. Lipid Profile: No results for input(s): "CHOL", "HDL", "LDLCALC", "TRIG", "CHOLHDL", "LDLDIRECT" in the last 72 hours. Thyroid Function Tests: No results for input(s): "TSH", "T4TOTAL", "FREET4", "T3FREE", "THYROIDAB" in the last 72 hours. Anemia Panel: No results for input(s): "VITAMINB12", "FOLATE", "FERRITIN", "TIBC", "IRON", "RETICCTPCT" in the last 72 hours. Sepsis Labs: No results for input(s): "PROCALCITON", "LATICACIDVEN" in the last 168 hours.  Recent Results (from the past 240 hours)  Resp panel by RT-PCR (RSV, Flu A&B, Covid) Anterior Nasal Swab     Status: None   Collection Time: 08/08/23  9:07 AM   Specimen: Anterior Nasal Swab  Result Value Ref Range Status   SARS Coronavirus 2 by RT PCR NEGATIVE NEGATIVE Final    Comment: (NOTE) SARS-CoV-2 target nucleic acids are NOT DETECTED.  The SARS-CoV-2 RNA is generally detectable in upper respiratory specimens during the acute phase of infection. The lowest concentration of SARS-CoV-2 viral copies this assay can detect is 138 copies/mL. A negative result does not preclude SARS-Cov-2 infection and should not be used as the sole basis for treatment or other patient management decisions. A negative result may occur with  improper specimen collection/handling, submission of specimen other than nasopharyngeal swab, presence of viral mutation(s) within the areas targeted by this assay, and inadequate number of viral copies(<138 copies/mL). A negative result must be combined with clinical observations, patient history, and epidemiological information. The  expected result is Negative.  Fact Sheet for Patients:  BloggerCourse.com  Fact Sheet for Healthcare Providers:  SeriousBroker.it  This test is no t yet approved or cleared by the [place]  FDA and  has been authorized for detection and/or diagnosis of SARS-CoV-2 by FDA under an Emergency Use Authorization (EUA). This EUA will remain  in effect (meaning this test can be used) for the duration of the COVID-19 declaration under Section 564(b)(1) of the Act, 21 U.S.C.section 360bbb-3(b)(1), unless the authorization is terminated  or revoked sooner.       Influenza A by PCR NEGATIVE NEGATIVE Final   Influenza B by PCR NEGATIVE NEGATIVE Final    Comment: (NOTE) The Xpert Xpress SARS-CoV-2/FLU/RSV plus assay is intended as an aid in the diagnosis of influenza from Nasopharyngeal swab specimens and should not be used as a sole basis for treatment. Nasal washings and aspirates are unacceptable for Xpert Xpress SARS-CoV-2/FLU/RSV testing.  Fact Sheet for Patients: BloggerCourse.com  Fact Sheet for Healthcare Providers: SeriousBroker.it  This test is not yet approved or cleared  by the [place]  FDA and has been authorized for detection and/or diagnosis of SARS-CoV-2 by FDA under an Emergency Use Authorization (EUA). This EUA will remain in effect (meaning this test can be used) for the duration of the COVID-19 declaration under Section 564(b)(1) of the Act, 21 U.S.C. section 360bbb-3(b)(1), unless the authorization is terminated or revoked.     Resp Syncytial Virus by PCR NEGATIVE NEGATIVE Final    Comment: (NOTE) Fact Sheet for Patients: BloggerCourse.com  Fact Sheet for Healthcare Providers: SeriousBroker.it  This test is not yet approved or cleared by the [place]  FDA and has been authorized for detection and/or  diagnosis of SARS-CoV-2 by FDA under an Emergency Use Authorization (EUA). This EUA will remain in effect (meaning this test can be used) for the duration of the COVID-19 declaration under Section 564(b)(1) of the Act, 21 U.S.C. section 360bbb-3(b)(1), unless the authorization is terminated or revoked.  Performed at [facility], [address], [place], [place] [address]          Radiology Studies: No results found.      Scheduled Meds:  cefUROXime   250 mg Oral BID WC   docusate sodium   100 mg Oral BID   fluticasone   1 spray Each Nare BID   guaiFENesin   600 mg Oral BID   losartan   12.5 mg Oral Daily   metoprolol  succinate  12.5 mg Oral Daily   multivitamin  1 tablet Oral BID   pantoprazole   40 mg Oral Daily   polyethylene glycol  17 g Oral Daily   potassium chloride   10 mEq Oral Daily   relugolix   120 mg Oral Daily   Rivaroxaban   15 mg Oral Q supper   sodium chloride  flush  3 mL Intravenous Q12H   spironolactone   12.5 mg Oral Daily   Continuous Infusions:   LOS: 9 days      Alphonsus Jeans, MD [facility] Pager [phone]  If 7PM-7AM, please contact night-coverage [email or web address]  08/17/2023, 8:43 AM

## 2023-08-18 DIAGNOSIS — I480 Paroxysmal atrial fibrillation: Secondary | ICD-10-CM | POA: Diagnosis not present

## 2023-08-18 DIAGNOSIS — I5033 Acute on chronic diastolic (congestive) heart failure: Secondary | ICD-10-CM | POA: Diagnosis not present

## 2023-08-18 LAB — BASIC METABOLIC PANEL WITH GFR
Anion gap: 10 (ref 5–15)
BUN: 46 mg/dL — ABNORMAL HIGH (ref 8–23)
CO2: 29 mmol/L (ref 22–32)
Calcium: 9.1 mg/dL (ref 8.9–10.3)
Chloride: 99 mmol/L (ref 98–111)
Creatinine, Ser: 1.44 mg/dL — ABNORMAL HIGH (ref 0.61–1.24)
GFR, Estimated: 46 mL/min — ABNORMAL LOW (ref >60)
Glucose, Bld: 117 mg/dL — ABNORMAL HIGH (ref 70–99)
Potassium: 4.8 mmol/L (ref 3.5–5.1)
Sodium: 138 mmol/L (ref 135–145)

## 2023-08-18 LAB — CBC
HCT: 35.4 % — ABNORMAL LOW (ref 39.0–52.0)
Hemoglobin: 11.3 g/dL — ABNORMAL LOW (ref 13.0–17.0)
MCH: 27.6 pg (ref 26.0–34.0)
MCHC: 31.9 g/dL (ref 30.0–36.0)
MCV: 86.6 fL (ref 80.0–100.0)
Platelets: 350 10*3/uL (ref 150–400)
RBC: 4.09 MIL/uL — ABNORMAL LOW (ref 4.22–5.81)
RDW: 15.9 % — ABNORMAL HIGH (ref 11.5–15.5)
WBC: 9.2 10*3/uL (ref 4.0–10.5)
nRBC: 0 % (ref 0.0–0.2)

## 2023-08-18 LAB — BLOOD GAS, VENOUS
Acid-Base Excess: 0.1 mmol/L (ref 0.0–2.0)
Bicarbonate: 27.6 mmol/L (ref 20.0–28.0)
O2 Saturation: 33.5 %
Patient temperature: 37
pCO2, Ven: 56 mmHg (ref 44–60)
pH, Ven: 7.3 (ref 7.25–7.43)

## 2023-08-18 MED ORDER — FUROSEMIDE 40 MG PO TABS
40.0000 mg | ORAL_TABLET | Freq: Every day | ORAL | Status: DC
Start: 2023-08-18 — End: 2023-08-23
  Administered 2023-08-18 – 2023-08-23 (×6): 40 mg via ORAL
  Filled 2023-08-18 (×6): qty 1

## 2023-08-18 MED ORDER — TAMSULOSIN HCL 0.4 MG PO CAPS
0.4000 mg | ORAL_CAPSULE | Freq: Every day | ORAL | Status: DC
  Administered 2023-08-18 – 2023-08-23 (×6): 0.4 mg via ORAL
  Filled 2023-08-18 (×6): qty 1

## 2023-08-18 NOTE — Plan of Care (Signed)

## 2023-08-18 NOTE — Progress Notes (Signed)
 [facility] Cardiology  SUBJECTIVE: Patient laying in bed, denies chest pain, reports less shortness of breath   Vitals:   08/18/23 0006 08/18/23 0336 08/18/23 0500 08/18/23 0852  BP: (!) 111/50 (!) 101/56  115/75  Pulse: (!) 59 (!) 56  63  Resp: 20 20  19   Temp: 98.5 F (36.9 C) 99 F (37.2 C)  98.1 F (36.7 C)  TempSrc:    Oral  SpO2: 95% 92%  94%  Weight:   87.8 kg   Height:         Intake/Output Summary (Last 24 hours) at 08/18/2023 0915 Last data filed at 08/18/2023 0400 Gross per 24 hour  Intake 840 ml  Output 100 ml  Net 740 ml      PHYSICAL EXAM  General: Well developed, well nourished, in no acute distress HEENT:  Normocephalic and atramatic Neck:  No JVD.  Lungs: Clear bilaterally to auscultation and percussion. Heart: HRRR . Normal S1 and S2 without gallops or murmurs.  Abdomen: Bowel sounds are positive, abdomen soft and non-tender  Msk:  Back normal, normal gait. Normal strength and tone for age. Extremities: No clubbing, cyanosis or edema.   Neuro: Alert and oriented X 3. Psych:  Good affect, responds appropriately   LABS: Basic Metabolic Panel: Recent Labs    08/17/23 0310 08/18/23 0522  NA 132* 138  K 4.4 4.8  CL 97* 99  CO2 29 29  GLUCOSE 124* 117*  BUN 56* 46*  CREATININE 1.44* 1.44*  CALCIUM  8.5* 9.1   Liver Function Tests: No results for input(s): "AST", "ALT", "ALKPHOS", "BILITOT", "PROT", "ALBUMIN" in the last 72 hours. No results for input(s): "LIPASE", "AMYLASE" in the last 72 hours. CBC: Recent Labs    08/17/23 0310 08/18/23 0522  WBC 7.4 9.2  HGB 11.0* 11.3*  HCT 34.6* 35.4*  MCV 86.9 86.6  PLT 328 350   Cardiac Enzymes: No results for input(s): "CKTOTAL", "CKMB", "CKMBINDEX", "TROPONINI" in the last 72 hours. BNP: Invalid input(s): "POCBNP" D-Dimer: No results for input(s): "DDIMER" in the last 72 hours. Hemoglobin A1C: No results for input(s): "HGBA1C" in the last 72 hours. Fasting Lipid Panel: No results for input(s):  "CHOL", "HDL", "LDLCALC", "TRIG", "CHOLHDL", "LDLDIRECT" in the last 72 hours. Thyroid Function Tests: No results for input(s): "TSH", "T4TOTAL", "T3FREE", "THYROIDAB" in the last 72 hours.  Invalid input(s): "FREET3" Anemia Panel: No results for input(s): "VITAMINB12", "FOLATE", "FERRITIN", "TIBC", "IRON", "RETICCTPCT" in the last 72 hours.  No results found.   Echo LVEF 50-55%  TELEMETRY: Atrial fibrillation 57 bpm:  ASSESSMENT AND PLAN:  Principal Problem:   Acute on chronic diastolic CHF (congestive heart failure) (HCC) Active Problems:   COPD (chronic obstructive pulmonary disease) (HCC)   Benign hypertension   Essential hypertension   Paroxysmal atrial fibrillation (HCC)   GERD without esophagitis   Acute respiratory failure with hypoxia (HCC)   CKD stage 3a, GFR 45-59 ml/min (HCC)   NSTEMI (non-ST elevated myocardial infarction) (HCC)   Pressure injury of skin    1.  Acute HFpEF, BNP 1057.8, LVEF 50-55%, clinically improved after initial diuresis 2.  Elevated troponin (395, 43, 129), in the absence of chest pain or new ECG changes, likely demand supply ischemia 3.  Chronic atrial fibrillation, rate controlled, on Xarelto  for stroke prevention 4.  Frequent PVCs 5.  CKD stage IIIa, BUN/creatinine 46 and 1.44 on 08/18/2023   Recommendations   1.  Agree with current therapy 2.  Continue Xarelto  for stroke prevention 3.  Start furosemide   40 mg daily 4.  Continue good medical management (metoprolol  succinate, losartan , spironolactone ) 5.  Defer further cardiac diagnostics at this time 6.  Consider discharge home later today or in a.m.   Percival Brace, MD, PhD, [facility] 08/18/2023 9:15 AM

## 2023-08-18 NOTE — Progress Notes (Signed)
 PROGRESS NOTE    Dennis Savage  EVO:[id number] DOB: 12-07-1929 DOA: 08/08/2023 PCP: Little Riff, MD   Assessment & Plan:   Principal Problem:   Acute on chronic diastolic CHF (congestive heart failure) (HCC) Active Problems:   Acute respiratory failure with hypoxia (HCC)   COPD (chronic obstructive pulmonary disease) (HCC)   NSTEMI (non-ST elevated myocardial infarction) (HCC)   Essential hypertension   Paroxysmal atrial fibrillation (HCC)   GERD without esophagitis   Benign hypertension   CKD stage 3a, GFR 45-59 ml/min (HCC)   Pressure injury of skin  Assessment and Plan: Acute on chronic diastolic CHF exacerbation: restart lasix  today. Continue on losartan , aldactone , & metoprolol . Monitor I/Os. Likely secondary to medication noncompliance as pt was not taking lasix  at home. Cardio following and recs apprec    Demand ischemia: as per cardio. NSTEMI r/o. No further inpatient work-up recommended as per cardio    Dysuria: UA shows rare bacteria. No improvement w/ pyridium  x 3 doses. Continue on ceftin . Urine cx is finalized showing insignificant growth. Discussed pt w/ uro on call, Dr. Inga Manges, who recommend pt try tamsulosin  & uribel. Dennis Savage is not available at [facility] pharmacy. Will need to f/u outpatient w/ uro   COPD: w/o exacerbation. Bronchodilators prn    HTN: continue on metoprolol , aldactone  & losartan . Holding amlodipine    Chronic a. fib: as per cardio. Continue on xarelto , metoprolol  as per cardio. Cardio cleared pt for d/c from the cardiac standpoint   GERD: continue on PPI    CKDIIIa: Cr is stable. Avoid nephrotoxic meds    Pressure injury: continue w/ wound care    Prostate cancer: continue on home dose of relugolix  for palliation. Need to f/u outpatient w/ uro         DVT prophylaxis: xarelto   Code Status: DNR Family Communication: called pt's daughter, [name], no answer so I left a voicemail  Disposition Plan: likely d/c to SNF  Level of care:  Telemetry Cardiac  Status is: Inpatient Remains inpatient appropriate because: needs SNF placement     Consultants:  Cardio   Procedures:   Antimicrobials:   Subjective: Pt still c/o dysuria   Objective: Vitals:   08/17/23 2029 08/18/23 0006 08/18/23 0336 08/18/23 0500  BP: (!) 102/51 (!) 111/50 (!) 101/56   Pulse: (!) 50 (!) 59 (!) 56   Resp: 18 20 20    Temp: 98.8 F (37.1 C) 98.5 F (36.9 C) 99 F (37.2 C)   TempSrc: Oral     SpO2: 96% 95% 92%   Weight:    87.8 kg  Height:        Intake/Output Summary (Last 24 hours) at 08/18/2023 0844 Last data filed at 08/18/2023 0400 Gross per 24 hour  Intake 840 ml  Output 100 ml  Net 740 ml   Filed Weights   08/15/23 0526 08/16/23 0500 08/18/23 0500  Weight: 84.4 kg 86.7 kg 87.8 kg    Examination:  General exam: Appears calm & comfortable  Respiratory system: diminished breath sounds b/l  Cardiovascular system: irregularly irregular  Gastrointestinal system: abd is soft, NT, ND & hypoactive bowel sounds Central nervous system: alert & oriented. Moves all extremities  Psychiatry: Judgement and insight appears at baseline. Flat mood and affect     Data Reviewed: I have personally reviewed following labs and imaging studies  CBC: Recent Labs  Lab 08/12/23 0407 08/13/23 0551 08/17/23 0310 08/18/23 0522  WBC 6.7 6.7 7.4 9.2  NEUTROABS 3.8  --   --   --  HGB 11.3* 12.1* 11.0* 11.3*  HCT 34.0* 36.7* 34.6* 35.4*  MCV 84.6 85.9 86.9 86.6  PLT 211 243 328 350   Basic Metabolic Panel: Recent Labs  Lab 08/14/23 0602 08/15/23 0632 08/16/23 0538 08/17/23 0310 08/18/23 0522  NA 137 137 134* 132* 138  K 4.1 3.7 3.8 4.4 4.8  CL 97* 99 98 97* 99  CO2 29 27 26 29 29   GLUCOSE 114* 109* 118* 124* 117*  BUN 47* 56* 53* 56* 46*  CREATININE 1.51* 1.68* 1.44* 1.44* 1.44*  CALCIUM  9.0 8.8* 8.8* 8.5* 9.1   GFR: Estimated Creatinine Clearance: 36.6 mL/min (A) (by C-G formula based on SCr of 1.44 mg/dL (H)). Liver  Function Tests: No results for input(s): "AST", "ALT", "ALKPHOS", "BILITOT", "PROT", "ALBUMIN" in the last 168 hours.  No results for input(s): "LIPASE", "AMYLASE" in the last 168 hours. No results for input(s): "AMMONIA" in the last 168 hours. Coagulation Profile: No results for input(s): "INR", "PROTIME" in the last 168 hours. Cardiac Enzymes: No results for input(s): "CKTOTAL", "CKMB", "CKMBINDEX", "TROPONINI" in the last 168 hours. BNP (last 3 results) No results for input(s): "PROBNP" in the last 8760 hours. HbA1C: No results for input(s): "HGBA1C" in the last 72 hours. CBG: No results for input(s): "GLUCAP" in the last 168 hours. Lipid Profile: No results for input(s): "CHOL", "HDL", "LDLCALC", "TRIG", "CHOLHDL", "LDLDIRECT" in the last 72 hours. Thyroid Function Tests: No results for input(s): "TSH", "T4TOTAL", "FREET4", "T3FREE", "THYROIDAB" in the last 72 hours. Anemia Panel: No results for input(s): "VITAMINB12", "FOLATE", "FERRITIN", "TIBC", "IRON", "RETICCTPCT" in the last 72 hours. Sepsis Labs: No results for input(s): "PROCALCITON", "LATICACIDVEN" in the last 168 hours.  Recent Results (from the past 240 hours)  Resp panel by RT-PCR (RSV, Flu A&B, Covid) Anterior Nasal Swab     Status: None   Collection Time: 08/08/23  9:07 AM   Specimen: Anterior Nasal Swab  Result Value Ref Range Status   SARS Coronavirus 2 by RT PCR NEGATIVE NEGATIVE Final    Comment: (NOTE) SARS-CoV-2 target nucleic acids are NOT DETECTED.  The SARS-CoV-2 RNA is generally detectable in upper respiratory specimens during the acute phase of infection. The lowest concentration of SARS-CoV-2 viral copies this assay can detect is 138 copies/mL. A negative result does not preclude SARS-Cov-2 infection and should not be used as the sole basis for treatment or other patient management decisions. A negative result may occur with  improper specimen collection/handling, submission of specimen other than  nasopharyngeal swab, presence of viral mutation(s) within the areas targeted by this assay, and inadequate number of viral copies(<138 copies/mL). A negative result must be combined with clinical observations, patient history, and epidemiological information. The expected result is Negative.  Fact Sheet for Patients:  BloggerCourse.com  Fact Sheet for Healthcare Providers:  SeriousBroker.it  This test is no t yet approved or cleared by the [place]  FDA and  has been authorized for detection and/or diagnosis of SARS-CoV-2 by FDA under an Emergency Use Authorization (EUA). This EUA will remain  in effect (meaning this test can be used) for the duration of the COVID-19 declaration under Section 564(b)(1) of the Act, 21 U.S.C.section 360bbb-3(b)(1), unless the authorization is terminated  or revoked sooner.       Influenza A by PCR NEGATIVE NEGATIVE Final   Influenza B by PCR NEGATIVE NEGATIVE Final    Comment: (NOTE) The Xpert Xpress SARS-CoV-2/FLU/RSV plus assay is intended as an aid in the diagnosis of influenza from Nasopharyngeal swab specimens and  should not be used as a sole basis for treatment. Nasal washings and aspirates are unacceptable for Xpert Xpress SARS-CoV-2/FLU/RSV testing.  Fact Sheet for Patients: BloggerCourse.com  Fact Sheet for Healthcare Providers: SeriousBroker.it  This test is not yet approved or cleared by the [place]  FDA and has been authorized for detection and/or diagnosis of SARS-CoV-2 by FDA under an Emergency Use Authorization (EUA). This EUA will remain in effect (meaning this test can be used) for the duration of the COVID-19 declaration under Section 564(b)(1) of the Act, 21 U.S.C. section 360bbb-3(b)(1), unless the authorization is terminated or revoked.     Resp Syncytial Virus by PCR NEGATIVE NEGATIVE Final    Comment:  (NOTE) Fact Sheet for Patients: BloggerCourse.com  Fact Sheet for Healthcare Providers: SeriousBroker.it  This test is not yet approved or cleared by the [place]  FDA and has been authorized for detection and/or diagnosis of SARS-CoV-2 by FDA under an Emergency Use Authorization (EUA). This EUA will remain in effect (meaning this test can be used) for the duration of the COVID-19 declaration under Section 564(b)(1) of the Act, 21 U.S.C. section 360bbb-3(b)(1), unless the authorization is terminated or revoked.  Performed at [facility], [address] [place], [place] [address]   Urine Culture (for pregnant, neutropenic or urologic patients or patients with an indwelling urinary catheter)     Status: Abnormal   Collection Time: 08/16/23  5:42 PM   Specimen: Urine, Clean Catch  Result Value Ref Range Status   Specimen Description   Final    URINE, CLEAN CATCH Performed at [facility], [address] [place], [place] [address]    Special Requests   Final    NONE Performed at [facility], [address] [place], [place] [address]    Culture (A)  Final    <10,000 COLONIES/mL INSIGNIFICANT GROWTH Performed at [facility], [address], [place], [place] [address]    Report Status 08/17/2023 FINAL  Final         Radiology Studies: No results found.      Scheduled Meds:  cefUROXime   250 mg Oral BID WC   docusate sodium   100 mg Oral BID   fluticasone   1 spray Each Nare BID   guaiFENesin   600 mg Oral BID   losartan   12.5 mg Oral Daily   metoprolol  succinate  12.5 mg Oral Daily   multivitamin  1 tablet Oral BID   pantoprazole   40 mg Oral Daily   polyethylene glycol  17 g Oral Daily   potassium chloride   10 mEq Oral Daily   relugolix   120 mg Oral Daily   Rivaroxaban   15 mg Oral Q supper   sodium chloride  flush  3 mL Intravenous Q12H   spironolactone   12.5 mg Oral Daily    Continuous Infusions:   LOS: 10 days      Alphonsus Jeans, MD [facility] Pager [phone]  If 7PM-7AM, please contact night-coverage [email or web address]  08/18/2023, 8:44 AM

## 2023-08-19 DIAGNOSIS — I480 Paroxysmal atrial fibrillation: Secondary | ICD-10-CM | POA: Diagnosis not present

## 2023-08-19 DIAGNOSIS — I5033 Acute on chronic diastolic (congestive) heart failure: Secondary | ICD-10-CM | POA: Diagnosis not present

## 2023-08-19 LAB — CBC
HCT: 35.8 % — ABNORMAL LOW (ref 39.0–52.0)
Hemoglobin: 11.5 g/dL — ABNORMAL LOW (ref 13.0–17.0)
MCH: 27.7 pg (ref 26.0–34.0)
MCHC: 32.1 g/dL (ref 30.0–36.0)
MCV: 86.3 fL (ref 80.0–100.0)
Platelets: 362 10*3/uL (ref 150–400)
RBC: 4.15 MIL/uL — ABNORMAL LOW (ref 4.22–5.81)
RDW: 15.9 % — ABNORMAL HIGH (ref 11.5–15.5)
WBC: 8.5 10*3/uL (ref 4.0–10.5)
nRBC: 0 % (ref 0.0–0.2)

## 2023-08-19 LAB — BASIC METABOLIC PANEL WITH GFR
Anion gap: 11 (ref 5–15)
BUN: 43 mg/dL — ABNORMAL HIGH (ref 8–23)
CO2: 28 mmol/L (ref 22–32)
Calcium: 9 mg/dL (ref 8.9–10.3)
Chloride: 99 mmol/L (ref 98–111)
Creatinine, Ser: 1.44 mg/dL — ABNORMAL HIGH (ref 0.61–1.24)
GFR, Estimated: 46 mL/min — ABNORMAL LOW (ref >60)
Glucose, Bld: 110 mg/dL — ABNORMAL HIGH (ref 70–99)
Potassium: 4.6 mmol/L (ref 3.5–5.1)
Sodium: 138 mmol/L (ref 135–145)

## 2023-08-19 NOTE — Progress Notes (Signed)
 Physical Therapy Treatment Patient Details Name: Dennis Savage MRN: [id number] DOB: 07/14/1929 Today's Date: 08/19/2023   History of Present Illness 89 y.o. male  with past medical history of proximal A-fib (Xarelto ), HFpEF, COPD, prostate cancer, CKD (stage III), HTN, GERD, and subdural hygroma admitted on 08/08/2023 with shortness of breath.  Patient had recent admission at [facility] for the same (March 2025). Patient is being treated for acute on chronic HFrEF, acute respiratory failure with volume overload, NSTEMI, and COPD.    PT Comments  Pt resting in bed upon PT arrival; pt agreeable to therapy.  During session pt was mod assist progressing to min assist with transfers from regular bed height up to RW; pt also able to ambulate 48 feet and then 24 feet with RW use (limited distance ambulating d/t pt fatigue; sitting rest break between ambulation trials).  SpO2 sats 96% or greater on 2 L O2 via nasal cannula and HR 63-85 bpm during sessions activities.  Will continue to focus on strengthening and progressive functional mobility during hospitalization.    If plan is discharge home, recommend the following: A little help with bathing/dressing/bathroom;Assistance with cooking/housework;Assist for transportation;Help with stairs or ramp for entrance;A lot of help with walking and/or transfers   Can travel by private vehicle        Equipment Recommendations  Rolling walker (2 wheels);BSC/3in1    Recommendations for Other Services       Precautions / Restrictions Precautions Precautions: Fall Recall of Precautions/Restrictions: Intact Precaution/Restrictions Comments: monitor O2 Restrictions Weight Bearing Restrictions Per Provider Order: No     Mobility  Bed Mobility Overal bed mobility: Needs Assistance Bed Mobility: Supine to Sit, Sit to Supine     Supine to sit: Min assist (assist for trunk; vc's for technique) Sit to supine: Supervision   General bed mobility comments: vc's for  technique    Transfers Overall transfer level: Needs assistance Equipment used: Rolling walker (2 wheels) Transfers: Sit to/from Stand Sit to Stand: Mod assist, Min assist           General transfer comment: mod assist 1st trial and min assist 2nd trial standing from bed at regular height; vc's for UE placement and overall technique/positioning    Ambulation/Gait Ambulation/Gait assistance: Contact guard assist Gait Distance (Feet):  (48 feet; 24 feet) Assistive device: Rolling walker (2 wheels) Gait Pattern/deviations: Step-through pattern, Decreased step length - right, Decreased step length - left, Narrow base of support Gait velocity: decreased     General Gait Details: limited d/t pt fatigue   Stairs             Wheelchair Mobility     Tilt Bed    Modified Rankin (Stroke Patients Only)       Balance Overall balance assessment: Needs assistance Sitting-balance support: No upper extremity supported, Feet supported Sitting balance-Leahy Scale: Good Sitting balance - Comments: steady reaching within BOS   Standing balance support: During functional activity, Reliant on assistive device for balance, Bilateral upper extremity supported Standing balance-Leahy Scale: Fair Standing balance comment: steady ambulating with RW use                            Communication Communication Communication: Impaired Factors Affecting Communication: Hearing impaired  Cognition Arousal: Alert Behavior During Therapy: WFL for tasks assessed/performed   PT - Cognitive impairments: No apparent impairments  Following commands: Intact      Cueing Cueing Techniques: Verbal cues  Exercises      General Comments  Nursing cleared pt for participation in physical therapy.  Pt agreeable to PT session.      Pertinent Vitals/Pain Pain Assessment Pain Assessment: No/denies pain Pain Intervention(s): Limited activity within  patient's tolerance, Monitored during session    Home Living        Prior Function            PT Goals (current goals can now be found in the care plan section) Acute Rehab PT Goals Patient Stated Goal: to improve his mobility PT Goal Formulation: With patient Time For Goal Achievement: 08/25/23 Potential to Achieve Goals: Good Progress towards PT goals: Progressing toward goals    Frequency    Min 2X/week      PT Plan      Co-evaluation              AM-PAC PT "6 Clicks" Mobility   Outcome Measure  Help needed turning from your back to your side while in a flat bed without using bedrails?: A Little Help needed moving from lying on your back to sitting on the side of a flat bed without using bedrails?: A Lot Help needed moving to and from a bed to a chair (including a wheelchair)?: A Little Help needed standing up from a chair using your arms (e.g., wheelchair or bedside chair)?: A Lot Help needed to walk in hospital room?: A Little Help needed climbing 3-5 steps with a railing? : A Lot 6 Click Score: 15    End of Session Equipment Utilized During Treatment: Gait belt;Oxygen (2 L via nasal cannula) Activity Tolerance: Patient tolerated treatment well;Patient limited by fatigue Patient left: in bed;with call bell/phone within reach;with bed alarm set;with nursing/sitter in room Nurse Communication: Mobility status;Precautions;Other (comment) (pt's vitals during session) PT Visit Diagnosis: Unsteadiness on feet (R26.81);Other abnormalities of gait and mobility (R26.89);Muscle weakness (generalized) (M62.81);Difficulty in walking, not elsewhere classified (R26.2)     Time: 4098-1191 PT Time Calculation (min) (ACUTE ONLY): 19 min  Charges:    $Therapeutic Activity: 8-22 mins PT General Charges $$ ACUTE PT VISIT: 1 Visit                     Amador Junes, PT 08/19/23, 5:21 PM

## 2023-08-19 NOTE — Progress Notes (Signed)
 PROGRESS NOTE    Dennis Savage  WGN:[id number] DOB: 04/04/1930 DOA: 08/08/2023 PCP: Little Riff, MD   Assessment & Plan:   Principal Problem:   Acute on chronic diastolic CHF (congestive heart failure) (HCC) Active Problems:   Acute respiratory failure with hypoxia (HCC)   COPD (chronic obstructive pulmonary disease) (HCC)   NSTEMI (non-ST elevated myocardial infarction) (HCC)   Essential hypertension   Paroxysmal atrial fibrillation (HCC)   GERD without esophagitis   Benign hypertension   CKD stage 3a, GFR 45-59 ml/min (HCC)   Pressure injury of skin  Assessment and Plan: Acute on chronic diastolic CHF exacerbation: continue on lasix , metoprolol , losartan , & aldactone  as per cardio. Monitor I/Os. Likely secondary to medication noncompliance as pt was not taking lasix  at home. Cardio signed off on 08/19/23    Demand ischemia: as per cardio. NSTEMI r/o. No further inpatient work-up recommended as per cardio    Dysuria: UA shows rare bacteria. No improvement w/ pyridium  x 3 doses. Continue on ceftin . Urine cx is finalized showing insignificant growth. Discussed pt w/ uro on call, Dr. Inga Manges, who recommend pt try tamsulosin  & uribel. Dennis Savage is not available at [facility] pharmacy. Will need to f/u outpatient w/ uro   COPD: w/o exacerbation. Bronchodilators prn     HTN: continue on losartan , metoprolol  & aldactone . D/c amlodipine    Chronic a. fib: as per cardio.Continue on metoprolol , xarelto  as per cardio. Cardio signed off on 08/19/23   GERD: continue on PPI    CKDIIIa: Cr is stable. Avoid nephrotoxic meds    Pressure injury: continue w/ wound care    Prostate cancer: continue on home dose of relugolix  for palliation. Need to f/u outpatient w/ uro         DVT prophylaxis: xarelto   Code Status: DNR Family Communication: Disposition Plan: likely d/c to SNF  Level of care: Telemetry Cardiac  Status is: Inpatient Remains inpatient appropriate because: medically stable.  Still needs SNF placement, messaged CM     Consultants:  Cardio   Procedures:   Antimicrobials:   Subjective: Pt c/o fatigue   Objective: Vitals:   08/19/23 0000 08/19/23 0458 08/19/23 0531 08/19/23 0807  BP: 120/62 118/61  106/70  Pulse: 60 (!) 56  64  Resp:  18    Temp: 97.7 F (36.5 C) 98 F (36.7 C)  97.8 F (36.6 C)  TempSrc: Oral Oral    SpO2: 94% 96%  97%  Weight:   88.7 kg   Height:        Intake/Output Summary (Last 24 hours) at 08/19/2023 0925 Last data filed at 08/19/2023 0539 Gross per 24 hour  Intake 720 ml  Output 1050 ml  Net -330 ml   Filed Weights   08/16/23 0500 08/18/23 0500 08/19/23 0531  Weight: 86.7 kg 87.8 kg 88.7 kg    Examination:  General exam: Appears comfortable  Respiratory system: decreased breath sounds b/l  Cardiovascular system: irregularly irregular  Gastrointestinal system: abd is soft, NT, ND & hypoactive bowel sounds  Central nervous system: alert & oriented. Moves all extremities  Psychiatry: Judgement and insight appears at baseline. Flat mood and affect     Data Reviewed: I have personally reviewed following labs and imaging studies  CBC: Recent Labs  Lab 08/13/23 0551 08/17/23 0310 08/18/23 0522 08/19/23 0551  WBC 6.7 7.4 9.2 8.5  HGB 12.1* 11.0* 11.3* 11.5*  HCT 36.7* 34.6* 35.4* 35.8*  MCV 85.9 86.9 86.6 86.3  PLT 243 328 350 362  Basic Metabolic Panel: Recent Labs  Lab 08/15/23 0632 08/16/23 0538 08/17/23 0310 08/18/23 0522 08/19/23 0551  NA 137 134* 132* 138 138  K 3.7 3.8 4.4 4.8 4.6  CL 99 98 97* 99 99  CO2 27 26 29 29 28   GLUCOSE 109* 118* 124* 117* 110*  BUN 56* 53* 56* 46* 43*  CREATININE 1.68* 1.44* 1.44* 1.44* 1.44*  CALCIUM  8.8* 8.8* 8.5* 9.1 9.0   GFR: Estimated Creatinine Clearance: 36.8 mL/min (A) (by C-G formula based on SCr of 1.44 mg/dL (H)). Liver Function Tests: No results for input(s): "AST", "ALT", "ALKPHOS", "BILITOT", "PROT", "ALBUMIN" in the last 168 hours.  No  results for input(s): "LIPASE", "AMYLASE" in the last 168 hours. No results for input(s): "AMMONIA" in the last 168 hours. Coagulation Profile: No results for input(s): "INR", "PROTIME" in the last 168 hours. Cardiac Enzymes: No results for input(s): "CKTOTAL", "CKMB", "CKMBINDEX", "TROPONINI" in the last 168 hours. BNP (last 3 results) No results for input(s): "PROBNP" in the last 8760 hours. HbA1C: No results for input(s): "HGBA1C" in the last 72 hours. CBG: No results for input(s): "GLUCAP" in the last 168 hours. Lipid Profile: No results for input(s): "CHOL", "HDL", "LDLCALC", "TRIG", "CHOLHDL", "LDLDIRECT" in the last 72 hours. Thyroid Function Tests: No results for input(s): "TSH", "T4TOTAL", "FREET4", "T3FREE", "THYROIDAB" in the last 72 hours. Anemia Panel: No results for input(s): "VITAMINB12", "FOLATE", "FERRITIN", "TIBC", "IRON", "RETICCTPCT" in the last 72 hours. Sepsis Labs: No results for input(s): "PROCALCITON", "LATICACIDVEN" in the last 168 hours.  Recent Results (from the past 240 hours)  Urine Culture (for pregnant, neutropenic or urologic patients or patients with an indwelling urinary catheter)     Status: Abnormal   Collection Time: 08/16/23  5:42 PM   Specimen: Urine, Clean Catch  Result Value Ref Range Status   Specimen Description   Final    URINE, CLEAN CATCH Performed at [facility], [address], [place], [place] [address]    Special Requests   Final    NONE Performed at [facility], [address], [place], [place] [address]    Culture (A)  Final    <10,000 COLONIES/mL INSIGNIFICANT GROWTH Performed at [facility], [address] [place], [place] [address]    Report Status 08/17/2023 FINAL  Final         Radiology Studies: No results found.      Scheduled Meds:  cefUROXime   250 mg Oral BID WC   docusate sodium   100 mg Oral BID   fluticasone   1 spray Each Nare BID   furosemide   40 mg Oral Daily    guaiFENesin   600 mg Oral BID   losartan   12.5 mg Oral Daily   metoprolol  succinate  12.5 mg Oral Daily   multivitamin  1 tablet Oral BID   pantoprazole   40 mg Oral Daily   polyethylene glycol  17 g Oral Daily   potassium chloride   10 mEq Oral Daily   relugolix   120 mg Oral Daily   Rivaroxaban   15 mg Oral Q supper   sodium chloride  flush  3 mL Intravenous Q12H   spironolactone   12.5 mg Oral Daily   tamsulosin   0.4 mg Oral Daily   Continuous Infusions:   LOS: 11 days      Alphonsus Jeans, MD [facility] Pager [phone]  If 7PM-7AM, please contact night-coverage [email or web address]  08/19/2023, 9:25 AM

## 2023-08-19 NOTE — Progress Notes (Addendum)
 [facility] CLINIC CARDIOLOGY PROGRESS NOTE       Patient ID: Dennis Savage MRN: [id number] DOB/AGE: 1929/06/22 89 y.o.  Admit date: 08/08/2023 Referring Physician Dr. Pablo Boards Primary Physician Little Riff, MD  Primary Cardiologist Bary Likes (last seen in 2023) Reason for Consultation AoC HFpEF  HPI: Dennis Savage is a 89 y.o. male  with a past medical history of chronic HFpEF, chronic atrial fibrillation on Xarelto , COPD who presented to the ED on 08/08/2023 for worsening SOB and LE edema. Cardiology was consulted for further evaluation.   Interval history: -Patient seen and examined this AM, resting comfortably in bed.  -States his breathing is good today, no LE edema.  -Continues to report dysuria. -Denies CP or palpitations.   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   Dizziness 02/25/2015   GERD (gastroesophageal reflux disease)    Heart murmur    Hypertension    Interstitial lung disease (HCC) 10/13/2013   LBP (low back pain) 11/03/2010   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   Peripheral vascular disease (HCC) 11/03/2010   Retinal hemorrhage 03/16/2014    Past Surgical History:  Procedure Laterality Date   APPENDECTOMY     BACK SURGERY     CARPAL TUNNEL RELEASE Bilateral    EYE SURGERY Bilateral    Cataract Extraction with IOL   HERNIA REPAIR     Umbilical Hernia X 3   JOINT REPLACEMENT     bilat knees   NASAL SINUS SURGERY      REPLACEMENT TOTAL KNEE Bilateral    SHOULDER SURGERY Right    TONSILLECTOMY     TOTAL HIP ARTHROPLASTY Right 10/12/2015   Procedure: TOTAL HIP ARTHROPLASTY;  Surgeon: Arlyne Lame, MD;  Location: [facility] ORS;  Service: Orthopedics;  Laterality: Right;   TOTAL HIP ARTHROPLASTY Left 02/15/2023   Procedure: TOTAL HIP ARTHROPLASTY;  Surgeon: Arlyne Lame, MD;  Location: [facility] ORS;  Service: Orthopedics;  Laterality: Left;    Medications Prior to Admission  Medication Sig Dispense Refill Last Dose/Taking   acetaminophen  (TYLENOL ) 325 MG tablet Take 650 mg by mouth every 6 (six) hours as needed.   Taking As Needed   albuterol  (VENTOLIN  HFA) 108 (90 Base) MCG/ACT inhaler Inhale 2 puffs into the lungs every 6 (six) hours as needed for wheezing or shortness of breath.   Taking As Needed   amLODipine  (NORVASC ) 10 MG tablet Take 10 mg by mouth daily.   08/07/2023   Calcium  Carb-Cholecalciferol  (CALCIUM  600 + D PO) Take 1 tablet by mouth daily.   08/07/2023   cholecalciferol  (VITAMIN D3) 25 MCG (1000 UNIT) tablet Take 1,000 Units by mouth daily.   08/07/2023   docusate sodium  (COLACE) 100 MG capsule Take 100 mg by mouth 2 (two) times daily.   08/07/2023   fluticasone  (FLONASE ) 50 MCG/ACT nasal spray Place 1 spray into both nostrils 2 (two) times daily.   08/07/2023   guaiFENesin  (MUCINEX ) 600 MG 12 hr tablet Take  600 mg by mouth 2 (two) times daily.   Taking   hydrOXYzine  (ATARAX ) 10 MG tablet Take 10 mg by mouth at bedtime as needed for itching.   Taking As Needed   Lactulose  20 GM/30ML SOLN Take 30 mLs (20 g total) by mouth daily as needed. 450 mL 0 Taking As Needed   Multiple Vitamins-Minerals (PRESERVISION AREDS 2 PO) Take 1 capsule by mouth 2 (two) times daily.   08/07/2023   Omega-3 Fatty Acids (FISH OIL) 300 MG CAPS Take 300 mg by mouth daily.   08/07/2023   omeprazole (PRILOSEC) 20 MG capsule Take 20 mg by mouth daily.   Past Week   polyethylene glycol (MIRALAX  / GLYCOLAX ) 17 g packet Take 17 g by  mouth daily. 14 each 0 Taking   potassium chloride  (KLOR-CON ) 10 MEQ tablet Take by mouth.   Past Week   relugolix  (ORGOVYX ) 120 MG tablet Take 1 tablet (120 mg total) by mouth daily. 30 tablet 11 08/07/2023   rivaroxaban  (XARELTO ) 20 MG TABS tablet Take 20 mg by mouth daily with supper. (Patient not taking: Reported on 08/08/2023)   Not Taking   Social History   Socioeconomic History   Marital status: Single    Spouse name: Not on file   Number of children: Not on file   Years of education: Not on file   Highest education level: Not on file  Occupational History   Not on file  Tobacco Use   Smoking status: Former    Current packs/day: 1.00    Types: Cigarettes   Smokeless tobacco: Never  Vaping Use   Vaping status: Never Used  Substance and Sexual Activity   Alcohol use: No   Drug use: No   Sexual activity: Not on file  Other Topics Concern   Not on file  Social History Narrative   Not on file   Social Drivers of Health   Financial Resource Strain: Low Risk  (06/07/2023)   Received from [facility]   Overall Financial Resource Strain (CARDIA)    Difficulty of Paying Living Expenses: Not hard at all  Food Insecurity: No Food Insecurity (08/10/2023)   Hunger Vital Sign    Worried About Running Out of Food in the Last Year: Never true    Ran Out of Food in the Last Year: Never true  Transportation Needs: No Transportation Needs (08/10/2023)   PRAPARE - Administrator, Civil Service (Medical): No    Lack of Transportation (Non-Medical): No  Physical Activity: Not on file  Stress: Not on file  Social Connections: Moderately Isolated (08/10/2023)   Social Connection and Isolation Panel [NHANES]    Frequency of Communication with Friends and Family: More than three times a week    Frequency of Social Gatherings with Friends and Family: More than three times a week    Attends Religious Services: More than 4 times per year    Active Member of [employer] or  Organizations: No    Attends Banker Meetings: Never    Marital Status: Divorced  Catering manager Violence: Not At Risk (08/10/2023)   Humiliation, Afraid, Rape, and Kick questionnaire    Fear of Current or Ex-Partner: No    Emotionally Abused: No    Physically Abused: No    Sexually Abused: No    Family History  Problem Relation Age of Onset   Bladder Cancer Neg Hx    Kidney cancer Neg Hx    Prostate cancer Neg Hx  Vitals:   08/19/23 0000 08/19/23 0458 08/19/23 0531 08/19/23 0807  BP: 120/62 118/61  106/70  Pulse: 60 (!) 56  64  Resp:  18    Temp: 97.7 F (36.5 C) 98 F (36.7 C)  97.8 F (36.6 C)  TempSrc: Oral Oral    SpO2: 94% 96%  97%  Weight:   88.7 kg   Height:        PHYSICAL EXAM General: Ill-appearing elderly male, well nourished, in no acute distress. HEENT: Normocephalic and atraumatic. Neck: No JVD.  Lungs: Normal respiratory effort on 2L [place].  Diminished bilaterally Heart: Irregularly irregular, controlled rate. Normal S1 and S2 without gallops or murmurs.  Abdomen: Non-distended appearing.  Msk: Normal strength and tone for age. Extremities: Warm and well perfused. No clubbing, cyanosis.  No edema bilaterally.  Neuro: Alert and oriented X 3. Psych: Answers questions appropriately.   Labs: Basic Metabolic Panel: Recent Labs    08/18/23 0522 08/19/23 0551  NA 138 138  K 4.8 4.6  CL 99 99  CO2 29 28  GLUCOSE 117* 110*  BUN 46* 43*  CREATININE 1.44* 1.44*  CALCIUM  9.1 9.0   Liver Function Tests: No results for input(s): "AST", "ALT", "ALKPHOS", "BILITOT", "PROT", "ALBUMIN" in the last 72 hours.  No results for input(s): "LIPASE", "AMYLASE" in the last 72 hours. CBC: Recent Labs    08/18/23 0522 08/19/23 0551  WBC 9.2 8.5  HGB 11.3* 11.5*  HCT 35.4* 35.8*  MCV 86.6 86.3  PLT 350 362    Cardiac Enzymes: No results for input(s): "CKTOTAL", "CKMB", "CKMBINDEX", "TROPONINIHS" in the last 72 hours.   BNP: No results  for input(s): "BNP" in the last 72 hours.  D-Dimer: No results for input(s): "DDIMER" in the last 72 hours. Hemoglobin A1C: No results for input(s): "HGBA1C" in the last 72 hours. Fasting Lipid Panel: No results for input(s): "CHOL", "HDL", "LDLCALC", "TRIG", "CHOLHDL", "LDLDIRECT" in the last 72 hours. Thyroid Function Tests: No results for input(s): "TSH", "T4TOTAL", "T3FREE", "THYROIDAB" in the last 72 hours.  Invalid input(s): "FREET3" Anemia Panel: No results for input(s): "VITAMINB12", "FOLATE", "FERRITIN", "TIBC", "IRON", "RETICCTPCT" in the last 72 hours.   Radiology: ECHOCARDIOGRAM COMPLETE Result Date: 08/10/2023    ECHOCARDIOGRAM REPORT   Patient Name:   Dennis Savage Date of Exam: 08/10/2023 Medical Rec #:  [id number]      Height:       69.0 in Accession #:    [id number]     Weight:       203.7 lb Date of Birth:  Nov 04, 1929      BSA:          2.082 m Patient Age:    89 years       BP:           128/64 mmHg Patient Gender: M              HR:           68 bpm. Exam Location:  Inpatient Procedure: 2D Echo (Both Spectral and Color Flow Doppler were utilized during            procedure). Indications:     congestive heart failure  History:         Patient has prior history of Echocardiogram examinations, most                  recent 06/18/2021. Chronic kidney disease and COPD,  Arrythmias:Atrial Fibrillation, Signs/Symptoms:Shortness of                  Breath; Risk Factors:Former Smoker and Hypertension.  Sonographer:     Dione Franks RDCS Referring Phys:  [id number] Gurleen Larrivee Diagnosing Phys: Constancia Delton MD IMPRESSIONS  1. Left ventricular ejection fraction, by estimation, is 50 to 55%. The left ventricle has low normal function. The left ventricle has no regional wall motion abnormalities. There is moderate concentric left ventricular hypertrophy. Left ventricular diastolic parameters are indeterminate.  2. Right ventricular systolic function is normal. The right  ventricular size is normal. There is normal pulmonary artery systolic pressure.  3. Left atrial size was severely dilated.  4. Right atrial size was severely dilated.  5. The mitral valve is normal in structure. Mild mitral valve regurgitation.  6. The aortic valve is tricuspid. Aortic valve regurgitation is mild. Aortic valve sclerosis/calcification is present, without any evidence of aortic stenosis.  7. Aortic dilatation noted. There is mild dilatation of the aortic root, measuring 40 mm.  8. The inferior vena cava is normal in size with greater than 50% respiratory variability, suggesting right atrial pressure of 3 mmHg. FINDINGS  Left Ventricle: Left ventricular ejection fraction, by estimation, is 50 to 55%. The left ventricle has low normal function. The left ventricle has no regional wall motion abnormalities. The left ventricular internal cavity size was normal in size. There is moderate concentric left ventricular hypertrophy. Left ventricular diastolic parameters are indeterminate. Right Ventricle: The right ventricular size is normal. No increase in right ventricular wall thickness. Right ventricular systolic function is normal. There is normal pulmonary artery systolic pressure. The tricuspid regurgitant velocity is 2.81 m/s, and  with an assumed right atrial pressure of 3 mmHg, the estimated right ventricular systolic pressure is 34.6 mmHg. Left Atrium: Left atrial size was severely dilated. Right Atrium: Right atrial size was severely dilated. Pericardium: Trivial pericardial effusion is present. Mitral Valve: The mitral valve is normal in structure. Mild mitral valve regurgitation. Tricuspid Valve: The tricuspid valve is normal in structure. Tricuspid valve regurgitation is mild. Aortic Valve: The aortic valve is tricuspid. Aortic valve regurgitation is mild. Aortic valve sclerosis/calcification is present, without any evidence of aortic stenosis. Pulmonic Valve: The pulmonic valve was normal in  structure. Pulmonic valve regurgitation is mild. Aorta: Aortic dilatation noted. There is mild dilatation of the aortic root, measuring 40 mm. Venous: The inferior vena cava is normal in size with greater than 50% respiratory variability, suggesting right atrial pressure of 3 mmHg. IAS/Shunts: No atrial level shunt detected by color flow Doppler.  LEFT VENTRICLE PLAX 2D LVIDd:         5.30 cm LVIDs:         3.90 cm LV PW:         1.50 cm LV IVS:        1.50 cm LVOT diam:     2.50 cm LV SV:         71 LV SV Index:   34 LVOT Area:     4.91 cm  RIGHT VENTRICLE             IVC RV Basal diam:  3.60 cm     IVC diam: 2.00 cm RV S prime:     11.40 cm/s LEFT ATRIUM              Index        RIGHT ATRIUM  Index LA diam:        6.00 cm  2.88 cm/m   RA Area:     32.80 cm LA Vol (A2C):   139.0 ml 66.76 ml/m  RA Volume:   109.00 ml 52.35 ml/m LA Vol (A4C):   130.0 ml 62.43 ml/m LA Biplane Vol: 139.0 ml 66.76 ml/m  AORTIC VALVE LVOT Vmax:   76.50 cm/s LVOT Vmean:  49.300 cm/s LVOT VTI:    0.144 m  AORTA Ao Root diam: 4.00 cm Ao Asc diam:  3.80 cm TRICUSPID VALVE TR Peak grad:   31.6 mmHg TR Vmax:        281.00 cm/s  SHUNTS Systemic VTI:  0.14 m Systemic Diam: 2.50 cm Constancia Delton MD Electronically signed by Constancia Delton MD Signature Date/Time: 08/10/2023/2:56:08 PM    Final    DG Chest Port 1 View Result Date: 08/08/2023 CLINICAL DATA:  Shortness of breath EXAM: PORTABLE CHEST 1 VIEW COMPARISON:  04/28/2023 FINDINGS: Cardiac shadow is enlarged but stable. Aortic calcifications are again seen. Patchy airspace opacity is noted in the upper lobes bilaterally. Stable calcified granuloma is noted in the left mid lung. Small effusions are seen bilaterally. IMPRESSION: Multifocal infiltrate with small effusions bilaterally. Electronically Signed   By: Violeta Grey M.D.   On: 08/08/2023 09:49    ECHO as above  TELEMETRY reviewed by me 08/19/2023: Atrial fibrillation rate 50-60s  EKG reviewed by me: atrial  fibrillation PVCs, baseline artifact, rate 90 bpm  Data reviewed by me 08/19/2023: last 24h vitals tele labs imaging I/O ED provider note, admission H&P, hospitalist progress note  Principal Problem:   Acute on chronic diastolic CHF (congestive heart failure) (HCC) Active Problems:   COPD (chronic obstructive pulmonary disease) (HCC)   Benign hypertension   Essential hypertension   Paroxysmal atrial fibrillation (HCC)   GERD without esophagitis   Acute respiratory failure with hypoxia (HCC)   CKD stage 3a, GFR 45-59 ml/min (HCC)   NSTEMI (non-ST elevated myocardial infarction) (HCC)   Pressure injury of skin    ASSESSMENT AND PLAN:  Dennis Savage is a 89 y.o. male  with a past medical history of chronic HFpEF, chronic atrial fibrillation on Xarelto , COPD who presented to the ED on 08/08/2023 for worsening SOB and LE edema. Cardiology was consulted for further evaluation.   # Acute hypoxic respiratory failure # Acute on chronic HFpEF # Demand ischemia # Chronic atrial fibrillation # Frequent PVCs Patient brought to the ED by EMS due to respiratory distress.  Per chart review family reported the patient had had worsening shortness of breath and lower extremity edema prior to coming to the ED.  BNP elevated at 1400.  Troponins 395 > 483.  Placed on BiPAP by EMS.  Started on IV Lasix  in the ED. Echo this admission with E 50-55%, no rWMAs, moderate LVH, mild MR, mild AR, mild aortic root dilatation 4.0 cm. -Continue losartan  12.5 mg daily, spironolactone  12.5 mg daily, metoprolol  succinate 12.5 mg daily. -Continue lasix  40 mg daily. -Strict intake/output, monitor renal function and electrolytes closely with diuresis. -Continue Xarelto  20 mg daily.  Patient reportedly had not been taking this at home. -Mildly elevated troponin most consistent with demand/supply mismatch and not ACS.  No plan for further cardiac diagnostics at this time. -Continue to wean O2 as able.  -Palliative has been  consulted by primary team.  Cardiology will sign off. Please haiku with questions or re-engage if needed.    This patient's plan of care  was discussed and created with Dr. Bob Burn and he is in agreement.  Signed: Hamp Levine, PA-C  08/19/2023, 8:23 AM [facility] Cardiology

## 2023-08-19 NOTE — TOC Progression Note (Signed)
 Transition of Care [facility]) - Progression Note    Patient Details  Name: Dennis Savage MRN: [id number] Date of Birth: 1929/08/15  Transition of [facility] CM/SW Contact  Baird Bombard, RN Phone Number: 08/19/2023, 3:44 PM  Clinical Narrative:    Attempt to contact patient's daughter regarding bed offers. No answer. Left a message requesting return call.          Expected Discharge Plan and Services                                               Social Determinants of Health (SDOH) Interventions SDOH Screenings   Food Insecurity: No Food Insecurity (08/10/2023)  Housing: Low Risk  (08/10/2023)  Transportation Needs: No Transportation Needs (08/10/2023)  Utilities: Not At Risk (08/10/2023)  Depression (PHQ2-9): Low Risk  (06/30/2021)  Financial Resource Strain: Low Risk  (06/07/2023)   Received from [facility]  Social Connections: Moderately Isolated (08/10/2023)  Tobacco Use: Medium Risk (08/08/2023)    Readmission Risk Interventions    04/18/2023    1:48 PM  Readmission Risk Prevention Plan  Transportation Screening Complete  Medication Review (RN Care Manager) Complete  HRI or Home Care Consult Complete  Palliative Care Screening Not Applicable  Skilled Nursing Facility Complete

## 2023-08-19 NOTE — Plan of Care (Signed)

## 2023-08-19 NOTE — Progress Notes (Signed)
 Occupational Therapy Treatment Patient Details Name: Dennis Savage MRN: [id number] DOB: 1929/10/29 Today's Date: 08/19/2023   History of present illness 89 y.o. male  with past medical history of proximal A-fib (Xarelto ), HFpEF, COPD, prostate cancer, CKD (stage III), HTN, GERD, and subdural hygroma admitted on 08/08/2023 with shortness of breath.  Patient had recent admission at [facility] for the same (March 2025). Patient is being treated for acute on chronic HFrEF, acute respiratory failure with volume overload, NSTEMI, and COPD.   OT comments  Upon entering the room, pt supine in bed and agreeable to OT intervention. Pt performs bed mobility with min A to EOB. He stands with min A and use of RW and ambulates 20' in room with close supervision for balance. Pt declines toileting and ADL needs at this time. Pt needing assistance managing O2 line during session. Pt returns to bed with min A for sit >supine with assistance for B LEs. Pt repositions self in bed with min A. Call bell and all needed items within reach. Pt reports feeling fatigued and planning to take a nap.       If plan is discharge home, recommend the following:  A lot of help with walking and/or transfers;A lot of help with bathing/dressing/bathroom;Assistance with cooking/housework;Assist for transportation;Help with stairs or ramp for entrance   Equipment Recommendations  Other (comment) (defer to next venue of care)       Precautions / Restrictions Precautions Precautions: Fall Recall of Precautions/Restrictions: Intact Precaution/Restrictions Comments: monitor o2       Mobility Bed Mobility Overal bed mobility: Needs Assistance Bed Mobility: Supine to Sit, Sit to Supine     Supine to sit: Min assist Sit to supine: Min assist        Transfers Overall transfer level: Needs assistance Equipment used: Rolling walker (2 wheels) Transfers: Sit to/from Stand Sit to Stand: Min assist                 Balance  Overall balance assessment: Needs assistance Sitting-balance support: No upper extremity supported, Feet supported Sitting balance-Leahy Scale: Fair     Standing balance support: During functional activity, Reliant on assistive device for balance, Bilateral upper extremity supported Standing balance-Leahy Scale: Fair Standing balance comment: improved static standing tolerance.                           ADL either performed or assessed with clinical judgement    Extremity/Trunk Assessment Upper Extremity Assessment Upper Extremity Assessment: Generalized weakness   Lower Extremity Assessment Lower Extremity Assessment: Generalized weakness        Vision Patient Visual Report: No change from baseline           Communication Communication Communication: Impaired Factors Affecting Communication: Hearing impaired   Cognition Arousal: Alert Behavior During Therapy: WFL for tasks assessed/performed Cognition: No apparent impairments                               Following commands: Intact        Cueing   Cueing Techniques: Verbal cues             Pertinent Vitals/ Pain       Pain Assessment Pain Assessment: No/denies pain  Home Living Family/patient expects to be discharged to:: Private residence Living Arrangements: Children Available Help at Discharge: Available PRN/intermittently Type of Home: House Home Access: Stairs to enter [employer]  of Steps: 1   Home Layout: One level     Bathroom Shower/Tub: Chief Strategy Officer: Standard     Home Equipment: Rollator (4 wheels);Cane - single point;Wheelchair - manual;Grab bars - tub/shower;Shower seat;Tub bench;Toilet riser              Frequency  Min 2X/week        Progress Toward Goals  OT Goals(current goals can now be found in the care plan section)     Acute Rehab OT Goals Patient Stated Goal: to go to rehab OT Goal Formulation: With  patient Time For Goal Achievement: 08/24/23 Potential to Achieve Goals: Good  Plan      Co-evaluation                 AM-PAC OT "6 Clicks" Daily Activity     Outcome Measure   Help from another person eating meals?: None Help from another person taking care of personal grooming?: None Help from another person toileting, which includes using toliet, bedpan, or urinal?: A Lot Help from another person bathing (including washing, rinsing, drying)?: A Lot Help from another person to put on and taking off regular upper body clothing?: A Little Help from another person to put on and taking off regular lower body clothing?: A Lot 6 Click Score: 17    End of Session Equipment Utilized During Treatment: Rolling walker (2 wheels);Oxygen  OT Visit Diagnosis: Muscle weakness (generalized) (M62.81);Unsteadiness on feet (R26.81)   Activity Tolerance Patient limited by fatigue   Patient Left in bed;with call bell/phone within reach;with bed alarm set   Nurse Communication Mobility status        Time: 9604-5409 OT Time Calculation (min): 18 min  Charges: OT General Charges $OT Visit: 1 Visit OT Treatments $Therapeutic Activity: 8-22 mins   George Kinder, MS, OTR/L , CBIS ascom [phone]  08/19/23, 3:35 PM

## 2023-08-20 ENCOUNTER — Telehealth: Payer: Self-pay

## 2023-08-20 DIAGNOSIS — I480 Paroxysmal atrial fibrillation: Secondary | ICD-10-CM | POA: Diagnosis not present

## 2023-08-20 DIAGNOSIS — I5033 Acute on chronic diastolic (congestive) heart failure: Secondary | ICD-10-CM | POA: Diagnosis not present

## 2023-08-20 LAB — BASIC METABOLIC PANEL WITH GFR
Anion gap: 9 (ref 5–15)
BUN: 47 mg/dL — ABNORMAL HIGH (ref 8–23)
CO2: 27 mmol/L (ref 22–32)
Calcium: 9 mg/dL (ref 8.9–10.3)
Chloride: 102 mmol/L (ref 98–111)
Creatinine, Ser: 1.49 mg/dL — ABNORMAL HIGH (ref 0.61–1.24)
GFR, Estimated: 44 mL/min — ABNORMAL LOW (ref >60)
Glucose, Bld: 113 mg/dL — ABNORMAL HIGH (ref 70–99)
Potassium: 4.5 mmol/L (ref 3.5–5.1)
Sodium: 138 mmol/L (ref 135–145)

## 2023-08-20 LAB — CBC
HCT: 34.2 % — ABNORMAL LOW (ref 39.0–52.0)
Hemoglobin: 10.8 g/dL — ABNORMAL LOW (ref 13.0–17.0)
MCH: 27.5 pg (ref 26.0–34.0)
MCHC: 31.6 g/dL (ref 30.0–36.0)
MCV: 87 fL (ref 80.0–100.0)
Platelets: 364 10*3/uL (ref 150–400)
RBC: 3.93 MIL/uL — ABNORMAL LOW (ref 4.22–5.81)
RDW: 15.9 % — ABNORMAL HIGH (ref 11.5–15.5)
WBC: 7.9 10*3/uL (ref 4.0–10.5)
nRBC: 0 % (ref 0.0–0.2)

## 2023-08-20 NOTE — Telephone Encounter (Signed)
 Dennis Savage, daughter advised, she is not sure if Pyridium  was given but I looked in the chart and there was an order for it 4/30 to 08/16/23.  Did discuss Cysto, daughter wanted to see if Dr Ace Holder was able to do that while patient is in the hospital as she is not sure how long before patient would be able to make it in the office? He will be getting discharged at some point to rehab. He keeps having trouble with this after been at home and then going to the hospital again.

## 2023-08-20 NOTE — Progress Notes (Signed)
 PROGRESS NOTE   HPI was taken from Dr. Daisey Dryer: Dennis Savage is a 89 y.o. male with medical history significant of paroxysmal atrial fibrillation on Xarelto , HFpEF, COPD, prostate cancer (on palliative therapy), CKD III, HTN, GERD, and subdural hygroma presenting with acute respiratory failure with hypoxia on BiPAP, acute on chronic HFpEF, volume overload.  History primarily from family.  Per report, patient with increased work of breathing with past 2 to 3 days.  Noted to have been recently admitted March 2025 in the [facility] system for similar issues.  Family reports worsening shortness of breath at home.  Has been using low-dose Lasix  intermittently.  Still with worsening lower extremity swelling and rales in the lungs.  Family reports that this is a recurring issue.  Has very narrow volume window.  No reported fevers or chills.  No reported wheezing or cough.  No reported chest pain or abdominal pain.  No reported high salt intake or NSAID use. Tmax 99.2, hemodynamically stable.  Initially satting 86% on room air.  Transition to BiPAP.  White count 10.1, hemoglobin 12.1, platelets 157, troponin 3 95-->430.CXR w/ bilateral pleural effusions.    Dennis Savage  FYB:[id number] DOB: 09/14/1929 DOA: 08/08/2023 PCP: Little Riff, MD   Assessment & Plan:   Principal Problem:   Acute on chronic diastolic CHF (congestive heart failure) (HCC) Active Problems:   Acute respiratory failure with hypoxia (HCC)   COPD (chronic obstructive pulmonary disease) (HCC)   NSTEMI (non-ST elevated myocardial infarction) (HCC)   Essential hypertension   Paroxysmal atrial fibrillation (HCC)   GERD without esophagitis   Benign hypertension   CKD stage 3a, GFR 45-59 ml/min (HCC)   Pressure injury of skin  Assessment and Plan: Acute on chronic diastolic CHF exacerbation: continue on lasix , losartan , aldactone  & metoprolol  as per cardio. Monitor I/Os. Likely secondary to medication noncompliance as pt was not taking  lasix  at home. Cardio signed off on 08/19/23    Demand ischemia: as per cardio. NSTEMI r/o. No further inpatient work-up recommended as per cardio    Dysuria: UA shows rare bacteria. No improvement w/ pyridium  x 3 doses. Complete ceftin  course. Urine cx is finalized showing insignificant growth. Discussed pt w/ uro on call, Dr. Inga Manges, who recommend pt try tamsulosin  & uribel. Dennis Savage is not available at [facility] pharmacy. Will need to f/u outpatient w/ uro   COPD: w/o exacerbation. Bronchodilators prn    HTN: continue on metoprolol , losartan , & aldactone . D/c amlodipine     Chronic a. fib: as per cardio.Continue on metoprolol , xarelto . Will need f/u outpatient w/ cardio. Cardio signed off on 08/19/23   GERD: continue on PPI    CKDIIIa: Cr is labile. Avoid nephrotoxic meds   Pressure injury: continue w/ wound care   Prostate cancer: continue on home dose of relugolix  for palliation. Need to f/u outpatient w/ uro         DVT prophylaxis: xarelto   Code Status: DNR Family Communication: Disposition Plan: likely d/c to SNF  Level of care: Telemetry Cardiac  Status is: Inpatient Remains inpatient appropriate because: medically stable. Still needs SNF placement,   Consultants:  Cardio   Procedures:   Antimicrobials:   Subjective: Pt c/o malaise   Objective: Vitals:   08/20/23 0044 08/20/23 0410 08/20/23 0526 08/20/23 0756  BP: (!) 153/123 108/67  111/68  Pulse: (!) 57 (!) 46  61  Resp: (!) 21 20  17   Temp: 98 F (36.7 C) 97.9 F (36.6 C)  97.9 F (36.6 C)  TempSrc:      SpO2: 94% 95%  95%  Weight:   86.7 kg   Height:        Intake/Output Summary (Last 24 hours) at 08/20/2023 0909 Last data filed at 08/20/2023 0645 Gross per 24 hour  Intake 720 ml  Output 845 ml  Net -125 ml   Filed Weights   08/18/23 0500 08/19/23 0531 08/20/23 0526  Weight: 87.8 kg 88.7 kg 86.7 kg    Examination:  General exam: Appears calm & comfortable Respiratory system: decreased breath  sounds b/l  Cardiovascular system: irregularly irregular  Gastrointestinal system: abd is soft, NT, ND & hypoactive bowel sounds  Central nervous system: alert & oriented. Moves all extremities  Psychiatry: Judgement and insight appears at baseline. Flat mood and affect    Data Reviewed: I have personally reviewed following labs and imaging studies  CBC: Recent Labs  Lab 08/17/23 0310 08/18/23 0522 08/19/23 0551 08/20/23 0503  WBC 7.4 9.2 8.5 7.9  HGB 11.0* 11.3* 11.5* 10.8*  HCT 34.6* 35.4* 35.8* 34.2*  MCV 86.9 86.6 86.3 87.0  PLT 328 350 362 364   Basic Metabolic Panel: Recent Labs  Lab 08/16/23 0538 08/17/23 0310 08/18/23 0522 08/19/23 0551 08/20/23 0503  NA 134* 132* 138 138 138  K 3.8 4.4 4.8 4.6 4.5  CL 98 97* 99 99 102  CO2 26 29 29 28 27   GLUCOSE 118* 124* 117* 110* 113*  BUN 53* 56* 46* 43* 47*  CREATININE 1.44* 1.44* 1.44* 1.44* 1.49*  CALCIUM  8.8* 8.5* 9.1 9.0 9.0   GFR: Estimated Creatinine Clearance: 35.2 mL/min (A) (by C-G formula based on SCr of 1.49 mg/dL (H)). Liver Function Tests: No results for input(s): "AST", "ALT", "ALKPHOS", "BILITOT", "PROT", "ALBUMIN" in the last 168 hours.  No results for input(s): "LIPASE", "AMYLASE" in the last 168 hours. No results for input(s): "AMMONIA" in the last 168 hours. Coagulation Profile: No results for input(s): "INR", "PROTIME" in the last 168 hours. Cardiac Enzymes: No results for input(s): "CKTOTAL", "CKMB", "CKMBINDEX", "TROPONINI" in the last 168 hours. BNP (last 3 results) No results for input(s): "PROBNP" in the last 8760 hours. HbA1C: No results for input(s): "HGBA1C" in the last 72 hours. CBG: No results for input(s): "GLUCAP" in the last 168 hours. Lipid Profile: No results for input(s): "CHOL", "HDL", "LDLCALC", "TRIG", "CHOLHDL", "LDLDIRECT" in the last 72 hours. Thyroid Function Tests: No results for input(s): "TSH", "T4TOTAL", "FREET4", "T3FREE", "THYROIDAB" in the last 72  hours. Anemia Panel: No results for input(s): "VITAMINB12", "FOLATE", "FERRITIN", "TIBC", "IRON", "RETICCTPCT" in the last 72 hours. Sepsis Labs: No results for input(s): "PROCALCITON", "LATICACIDVEN" in the last 168 hours.  Recent Results (from the past 240 hours)  Urine Culture (for pregnant, neutropenic or urologic patients or patients with an indwelling urinary catheter)     Status: Abnormal   Collection Time: 08/16/23  5:42 PM   Specimen: Urine, Clean Catch  Result Value Ref Range Status   Specimen Description   Final    URINE, CLEAN CATCH Performed at [facility], [address] [place], [place] [address]    Special Requests   Final    NONE Performed at [facility], [address], [place], [place] [address]    Culture (A)  Final    <10,000 COLONIES/mL INSIGNIFICANT GROWTH Performed at [facility], [address], [place], [place] [address]    Report Status 08/17/2023 FINAL  Final         Radiology Studies:  No results found.      Scheduled Meds:  docusate sodium   100 mg Oral BID   fluticasone   1 spray Each Nare BID   furosemide   40 mg Oral Daily   guaiFENesin   600 mg Oral BID   losartan   12.5 mg Oral Daily   metoprolol  succinate  12.5 mg Oral Daily   multivitamin  1 tablet Oral BID   pantoprazole   40 mg Oral Daily   polyethylene glycol  17 g Oral Daily   potassium chloride   10 mEq Oral Daily   relugolix   120 mg Oral Daily   Rivaroxaban   15 mg Oral Q supper   sodium chloride  flush  3 mL Intravenous Q12H   spironolactone   12.5 mg Oral Daily   tamsulosin   0.4 mg Oral Daily   Continuous Infusions:   LOS: 12 days      Alphonsus Jeans, MD [facility] Pager [phone]  If 7PM-7AM, please contact night-coverage [email or web address]  08/20/2023, 9:09 AM

## 2023-08-20 NOTE — TOC Progression Note (Signed)
 Transition of Care [facility]) - Progression Note    Patient Details  Name: Dennis Savage MRN: [id number] Date of Birth: 1929-08-16  Transition of Care [facility]) CM/SW Contact  Baird Bombard, RN Phone Number: 08/20/2023, 4:13 PM  Clinical Narrative:    Spoke with patient's daughter, Laura,regarding bed offers. She does not feel patient is stable for discharge. She was advised patient would be discharge when medically stable. She inquired about [facility] and was advised patient does not have a bed offer for [facility]. She was provided the bed offers for [facility], [facility], [facility], and [facility]. Jeanann Midget stated she will go by [facility] before making her decision.         Expected Discharge Plan and Services                                               Social Determinants of Health (SDOH) Interventions SDOH Screenings   Food Insecurity: No Food Insecurity (08/10/2023)  Housing: Low Risk  (08/10/2023)  Transportation Needs: No Transportation Needs (08/10/2023)  Utilities: Not At Risk (08/10/2023)  Depression (PHQ2-9): Low Risk  (06/30/2021)  Financial Resource Strain: Low Risk  (06/07/2023)   Received from [facility]  Social Connections: Moderately Isolated (08/10/2023)  Tobacco Use: Medium Risk (08/08/2023)    Readmission Risk Interventions    04/18/2023    1:48 PM  Readmission Risk Prevention Plan  Transportation Screening Complete  Medication Review (RN Care Manager) Complete  HRI or Home Care Consult Complete  Palliative Care Screening Not Applicable  Skilled Nursing Facility Complete

## 2023-08-20 NOTE — Plan of Care (Signed)

## 2023-08-20 NOTE — Telephone Encounter (Signed)
 Pts daughter would like [name] to review IN pt notes and contact her with recommendations. She states pt has been admitted since 4/24. Hospitalist did consult with Dr. Inga Manges who advised continuing with ATB add tamsulosin  and uribel.  Dennis Savage is not available at pharmacy.   Pts daughter is concerned as he is having burning with urination x 10 days. Daughter is aware that [name] is in clinic seeing pts. Will contact as soon as she is able.   Daughter voiced understanding.

## 2023-08-20 NOTE — Telephone Encounter (Signed)
 Pt's daughter LM on triage line, however voicemail cut off before she could state the patient's needs. Called daughter back, no answer.

## 2023-08-20 NOTE — Telephone Encounter (Signed)
 Chart reviewed.  His urinalysis and urine culture were both completely negative so I am not sure why he is having dysuria now.  Agree with outpatient management, may need a cystoscopy to evaluate further.  Perhaps they can try Pyridium ?  Dustin Gimenez, MD

## 2023-08-21 DIAGNOSIS — I5033 Acute on chronic diastolic (congestive) heart failure: Secondary | ICD-10-CM | POA: Diagnosis not present

## 2023-08-21 LAB — CBC
HCT: 34 % — ABNORMAL LOW (ref 39.0–52.0)
Hemoglobin: 10.8 g/dL — ABNORMAL LOW (ref 13.0–17.0)
MCH: 27.1 pg (ref 26.0–34.0)
MCHC: 31.8 g/dL (ref 30.0–36.0)
MCV: 85.2 fL (ref 80.0–100.0)
Platelets: 373 10*3/uL (ref 150–400)
RBC: 3.99 MIL/uL — ABNORMAL LOW (ref 4.22–5.81)
RDW: 15.6 % — ABNORMAL HIGH (ref 11.5–15.5)
WBC: 7.2 10*3/uL (ref 4.0–10.5)
nRBC: 0 % (ref 0.0–0.2)

## 2023-08-21 LAB — BASIC METABOLIC PANEL WITH GFR
Anion gap: 8 (ref 5–15)
BUN: 42 mg/dL — ABNORMAL HIGH (ref 8–23)
CO2: 26 mmol/L (ref 22–32)
Calcium: 9.2 mg/dL (ref 8.9–10.3)
Chloride: 102 mmol/L (ref 98–111)
Creatinine, Ser: 1.49 mg/dL — ABNORMAL HIGH (ref 0.61–1.24)
GFR, Estimated: 44 mL/min — ABNORMAL LOW (ref >60)
Glucose, Bld: 101 mg/dL — ABNORMAL HIGH (ref 70–99)
Potassium: 4.6 mmol/L (ref 3.5–5.1)
Sodium: 136 mmol/L (ref 135–145)

## 2023-08-21 NOTE — Telephone Encounter (Signed)
 We cannot do office procedure (cysto) while in the hospital unfortunately.  He will need to f/u after he is discharged.  Dustin Gimenez, MD

## 2023-08-21 NOTE — Progress Notes (Signed)
 Progress Note   Patient: Dennis Savage [id number] DOB: 1929-09-03 DOA: 08/08/2023     13 DOS: the patient was seen and examined on 08/21/2023   Brief hospital course:    HPI was taken from Dr. Daisey Dryer: Dennis Savage is a 89 y.o. male with medical history significant of paroxysmal atrial fibrillation on Xarelto , HFpEF, COPD, prostate cancer (on palliative therapy), CKD III, HTN, GERD, and subdural hygroma presenting with acute respiratory failure with hypoxia on BiPAP, acute on chronic HFpEF, volume overload.  History primarily from family.  Per report, patient with increased work of breathing with past 2 to 3 days.  Noted to have been recently admitted March 2025 in the [facility] system for similar issues.  Family reports worsening shortness of breath at home.  Has been using low-dose Lasix  intermittently.  Still with worsening lower extremity swelling and rales in the lungs.  Family reports that this is a recurring issue.  Has very narrow volume window.  No reported fevers or chills.  No reported wheezing or cough.  No reported chest pain or abdominal pain.  No reported high salt intake or NSAID use.   Assessment and Plan: Acute on chronic diastolic CHF exacerbation:  Continue on lasix , losartan , aldactone  & metoprolol  as per cardio. Monitor I/Os. Likely secondary to medication noncompliance as pt was not taking lasix  at home. Cardio signed off on 08/19/23    Demand ischemia: as per cardio. NSTEMI r/o. No further inpatient work-up recommended as per cardio    Dysuria: UA shows rare bacteria. No improvement w/ pyridium  x 3 doses. Complete ceftin  course. Urine cx is finalized showing insignificant growth.  Previous provider discussed pt w/ uro on call, Dr. Inga Manges, who recommend pt try tamsulosin  & uribel. Delories Fetter is not available at [facility] pharmacy.  Outpatient follow-up with urologist   COPD: w/o exacerbation.  Continue bronchodilators prn    Essential hypertension  continue on metoprolol , losartan ,  & aldactone . D/c amlodipine      Chronic atrial fibrillation:  According to cardiology continue on metoprolol , xarelto . Will need f/u outpatient w/ cardio. Cardio signed off on 08/19/23    GERD: continue on PPI    CKDIIIa: Cr is labile. Avoid nephrotoxic meds   Pressure injury: continue w/ wound care   Prostate cancer: continue on home dose of relugolix  for palliation. Need to f/u outpatient w/ uro   DVT prophylaxis: xarelto    Code Status: DNR  Family Communication:   Status is: Inpatient Remains inpatient appropriate because: medically stable. Still needs SNF placement,    Subjective:   Patient seen and examined at bedside this morning Denies any acute overnight events  Physical Exam: General exam: Appears calm & comfortable Respiratory system: decreased breath sounds b/l  Cardiovascular system: irregularly irregular  Gastrointestinal system: abd is soft, NT, ND & hypoactive bowel sounds  Central nervous system: alert & oriented. Moves all extremities  Psychiatry: Judgement and insight appears at baseline. Flat mood and affect       Data Reviewed:    Latest Ref Rng & Units 08/21/2023    6:35 AM 08/20/2023    5:03 AM 08/19/2023    5:51 AM  CBC  WBC 4.0 - 10.5 K/uL 7.2  7.9  8.5   Hemoglobin 13.0 - 17.0 g/dL 27.2  53.6  64.4   Hematocrit 39.0 - 52.0 % 34.0  34.2  35.8   Platelets 150 - 400 K/uL 373  364  362        Latest Ref Rng & Units  08/21/2023    6:35 AM 08/20/2023    5:03 AM 08/19/2023    5:51 AM  BMP  Glucose 70 - 99 mg/dL 161  096  045   BUN 8 - 23 mg/dL 42  47  43   Creatinine 0.61 - 1.24 mg/dL 4.09  8.11  9.14   Sodium 135 - 145 mmol/L 136  138  138   Potassium 3.5 - 5.1 mmol/L 4.6  4.5  4.6   Chloride 98 - 111 mmol/L 102  102  99   CO2 22 - 32 mmol/L 26  27  28    Calcium  8.9 - 10.3 mg/dL 9.2  9.0  9.0     Vitals:   08/21/23 0500 08/21/23 0758 08/21/23 1046 08/21/23 1511  BP:  (!) 131/58 (!) 125/57 (!) 99/56  Pulse:  79 (!) 51 62  Resp:  16 19 18   Temp:   97.6 F (36.4 C) 97.9 F (36.6 C) 98.7 F (37.1 C)  TempSrc:      SpO2:  97% 99% 96%  Weight: 85.4 kg     Height:         Author: Ezzard Holms, MD 08/21/2023 5:47 PM  For on call review [email or web address].

## 2023-08-21 NOTE — Plan of Care (Signed)

## 2023-08-21 NOTE — TOC Progression Note (Signed)
 Transition of Care [facility]) - Progression Note    Patient Details  Name: Dennis Savage MRN: [id number] Date of Birth: 1929/05/15  Transition of Care [facility]) CM/SW Contact  Baird Bombard, RN Phone Number: 08/21/2023, 4:24 PM  Clinical Narrative:    Attempt to speak with patient regarding bed offer. Patient sleeping.         Expected Discharge Plan and Services                                               Social Determinants of Health (SDOH) Interventions SDOH Screenings   Food Insecurity: No Food Insecurity (08/10/2023)  Housing: Low Risk  (08/10/2023)  Transportation Needs: No Transportation Needs (08/10/2023)  Utilities: Not At Risk (08/10/2023)  Depression (PHQ2-9): Low Risk  (06/30/2021)  Financial Resource Strain: Low Risk  (06/07/2023)   Received from [facility]  Social Connections: Moderately Isolated (08/10/2023)  Tobacco Use: Medium Risk (08/08/2023)    Readmission Risk Interventions    04/18/2023    1:48 PM  Readmission Risk Prevention Plan  Transportation Screening Complete  Medication Review (RN Care Manager) Complete  HRI or Home Care Consult Complete  Palliative Care Screening Not Applicable  Skilled Nursing Facility Complete

## 2023-08-21 NOTE — Telephone Encounter (Signed)
[name] advised. 

## 2023-08-21 NOTE — Progress Notes (Signed)
 PT Cancellation Note  Patient Details Name: Dennis Savage MRN: [id number] DOB: [birth year]   Cancelled Treatment:    Reason Eval/Treat Not Completed: Fatigue/lethargy limiting ability to participate (Chart reviewed, PT treatment attempted twice. Pt sleeping heavily both attempts, does not awake to voice to light touch. Will continue to follow and resume treatment at later date/time.)  4:43 PM, 08/21/23 Dawn Eth, PT, DPT Physical Therapist - [facility]  [phone] [facility])     Dennis Savage 08/21/2023, 4:43 PM

## 2023-08-22 DIAGNOSIS — I5033 Acute on chronic diastolic (congestive) heart failure: Secondary | ICD-10-CM | POA: Diagnosis not present

## 2023-08-22 LAB — BASIC METABOLIC PANEL WITH GFR
Anion gap: 11 (ref 5–15)
BUN: 42 mg/dL — ABNORMAL HIGH (ref 8–23)
CO2: 24 mmol/L (ref 22–32)
Calcium: 9.1 mg/dL (ref 8.9–10.3)
Chloride: 103 mmol/L (ref 98–111)
Creatinine, Ser: 1.52 mg/dL — ABNORMAL HIGH (ref 0.61–1.24)
GFR, Estimated: 43 mL/min — ABNORMAL LOW (ref >60)
Glucose, Bld: 97 mg/dL (ref 70–99)
Potassium: 4.8 mmol/L (ref 3.5–5.1)
Sodium: 138 mmol/L (ref 135–145)

## 2023-08-22 LAB — CBC WITH DIFFERENTIAL/PLATELET
Abs Immature Granulocytes: 0.09 10*3/uL — ABNORMAL HIGH (ref 0.00–0.07)
Basophils Absolute: 0 10*3/uL (ref 0.0–0.1)
Basophils Relative: 0 %
Eosinophils Absolute: 0.1 10*3/uL (ref 0.0–0.5)
Eosinophils Relative: 1 %
HCT: 35 % — ABNORMAL LOW (ref 39.0–52.0)
Hemoglobin: 10.8 g/dL — ABNORMAL LOW (ref 13.0–17.0)
Immature Granulocytes: 1 %
Lymphocytes Relative: 24 %
Lymphs Abs: 1.9 10*3/uL (ref 0.7–4.0)
MCH: 27.8 pg (ref 26.0–34.0)
MCHC: 30.9 g/dL (ref 30.0–36.0)
MCV: 90 fL (ref 80.0–100.0)
Monocytes Absolute: 0.7 10*3/uL (ref 0.1–1.0)
Monocytes Relative: 9 %
Neutro Abs: 4.9 10*3/uL (ref 1.7–7.7)
Neutrophils Relative %: 65 %
Platelets: 351 10*3/uL (ref 150–400)
RBC: 3.89 MIL/uL — ABNORMAL LOW (ref 4.22–5.81)
RDW: 15.9 % — ABNORMAL HIGH (ref 11.5–15.5)
WBC: 7.7 10*3/uL (ref 4.0–10.5)
nRBC: 0 % (ref 0.0–0.2)

## 2023-08-22 MED ORDER — LOSARTAN POTASSIUM 25 MG PO TABS: 12.5000 mg | ORAL_TABLET | Freq: Every day | ORAL | Status: AC

## 2023-08-22 MED ORDER — FUROSEMIDE 40 MG PO TABS: 40.0000 mg | ORAL_TABLET | Freq: Every day | ORAL | Status: AC

## 2023-08-22 MED ORDER — SPIRONOLACTONE 25 MG PO TABS: 12.5000 mg | ORAL_TABLET | Freq: Every day | ORAL | Status: AC

## 2023-08-22 MED ORDER — TAMSULOSIN HCL 0.4 MG PO CAPS: 0.4000 mg | ORAL_CAPSULE | Freq: Every day | ORAL | Status: AC

## 2023-08-22 MED ORDER — RIVAROXABAN 15 MG PO TABS: 15.0000 mg | ORAL_TABLET | Freq: Every day | ORAL | Status: DC

## 2023-08-22 MED ORDER — METOPROLOL SUCCINATE ER 25 MG PO TB24: 12.5000 mg | ORAL_TABLET | Freq: Every day | ORAL | Status: AC

## 2023-08-22 NOTE — Progress Notes (Signed)
 Physical Therapy Treatment Patient Details Name: Dennis Savage MRN: [id number] DOB: Jun 17, 1929 Today's Date: 08/22/2023   History of Present Illness 89 y.o. male  with past medical history of proximal A-fib (Xarelto ), HFpEF, COPD, prostate cancer, CKD (stage III), HTN, GERD, and subdural hygroma admitted on 08/08/2023 with shortness of breath.  Patient had recent admission at [facility] for the same (March 2025). Patient is being treated for acute on chronic HFrEF, acute respiratory failure with volume overload, NSTEMI, and COPD.    PT Comments  Pt resting in bed upon PT arrival; pt agreeable to therapy.  During session pt was SBA semi-supine to sitting EOB; min assist to stand from bed (mod assist to stand from toilet) up to RW; and CGA to ambulate up to 40 feet with RW use.  Pt requiring min assist for balance when pt attempting to obtain toilet paper for peri-care with B UE's.  Will continue to focus on strengthening, balance, and progressive functional mobility during hospitalization.    If plan is discharge home, recommend the following: A little help with bathing/dressing/bathroom;Assistance with cooking/housework;Assist for transportation;Help with stairs or ramp for entrance;A lot of help with walking and/or transfers   Can travel by private vehicle        Equipment Recommendations  Rolling walker (2 wheels);BSC/3in1    Recommendations for Other Services       Precautions / Restrictions Precautions Precautions: Fall Recall of Precautions/Restrictions: Intact Precaution/Restrictions Comments: monitor O2 Restrictions Weight Bearing Restrictions Per Provider Order: No     Mobility  Bed Mobility Overal bed mobility: Needs Assistance Bed Mobility: Supine to Sit     Supine to sit: Supervision, HOB elevated, Used rails     General bed mobility comments: HOB significantly elevated per pt request; increased time to perform on own    Transfers Overall transfer level: Needs  assistance Equipment used: Rolling walker (2 wheels) Transfers: Sit to/from Stand Sit to Stand: Min assist, Mod assist           General transfer comment: min assist to stand from bed; mod assist to stand from toilet with use of grab bar; vc's for UE positioning    Ambulation/Gait Ambulation/Gait assistance: Contact guard assist Gait Distance (Feet):  (40 feet; 18 feet) Assistive device: Rolling walker (2 wheels) Gait Pattern/deviations: Step-through pattern, Decreased step length - right, Decreased step length - left, Narrow base of support Gait velocity: decreased         Stairs             Wheelchair Mobility     Tilt Bed    Modified Rankin (Stroke Patients Only)       Balance Overall balance assessment: Needs assistance Sitting-balance support: No upper extremity supported, Feet supported Sitting balance-Leahy Scale: Good Sitting balance - Comments: steady reaching within BOS   Standing balance support: No upper extremity supported Standing balance-Leahy Scale: Poor Standing balance comment: min assist for standing balance with pt obtaining toilet paper for peri-care (pt requiring at least single UE support to maintain balance in standing)                            Communication Communication Communication: Impaired Factors Affecting Communication: Hearing impaired  Cognition Arousal: Alert Behavior During Therapy: WFL for tasks assessed/performed   PT - Cognitive impairments: No apparent impairments  Following commands: Intact      Cueing Cueing Techniques: Verbal cues  Exercises      General Comments        Pertinent Vitals/Pain Pain Assessment Pain Assessment: No/denies pain Pain Intervention(s): Limited activity within patient's tolerance, Monitored during session SpO2 sats 96% or greater on 2 L O2 via nasal cannula during session.    Home Living                           Prior Function            PT Goals (current goals can now be found in the care plan section) Acute Rehab PT Goals Patient Stated Goal: to improve his mobility PT Goal Formulation: With patient Time For Goal Achievement: 08/25/23 Potential to Achieve Goals: Good Progress towards PT goals: Progressing toward goals    Frequency    Min 2X/week      PT Plan      Co-evaluation              AM-PAC PT "6 Clicks" Mobility   Outcome Measure  Help needed turning from your back to your side while in a flat bed without using bedrails?: A Little Help needed moving from lying on your back to sitting on the side of a flat bed without using bedrails?: A Little Help needed moving to and from a bed to a chair (including a wheelchair)?: A Little Help needed standing up from a chair using your arms (e.g., wheelchair or bedside chair)?: A Lot Help needed to walk in hospital room?: A Little Help needed climbing 3-5 steps with a railing? : A Lot 6 Click Score: 16    End of Session Equipment Utilized During Treatment: Gait belt;Oxygen (2 L via nasal cannula) Activity Tolerance: Patient tolerated treatment well;Patient limited by fatigue Patient left: in chair;with call bell/phone within reach;with chair alarm set Nurse Communication: Mobility status;Precautions PT Visit Diagnosis: Unsteadiness on feet (R26.81);Other abnormalities of gait and mobility (R26.89);Muscle weakness (generalized) (M62.81);Difficulty in walking, not elsewhere classified (R26.2)     Time: 0960-4540 PT Time Calculation (min) (ACUTE ONLY): 20 min  Charges:    $Therapeutic Activity: 8-22 mins PT General Charges $$ ACUTE PT VISIT: 1 Visit                     Amador Junes, PT 08/22/23, 12:17 PM

## 2023-08-22 NOTE — Discharge Summary (Deleted)
 Physician Discharge Summary   Patient: Dennis Savage MRN: [id number] DOB: [birth year]  Admit date:     08/08/2023  Discharge date: 08/22/23  Discharge Physician: Ezzard Holms   PCP: Little Riff, MD   Recommendations at discharge:  Follow-up with cardiology, urology as well as PCP  Discharge Diagnoses: Acute on chronic diastolic CHF exacerbation:  Demand ischemia:  Dysuria:  COPD: w/o exacerbation.  Essential hypertension  Chronic atrial fibrillation:  GERD: CKDIIIa:  Pressure injury: Prostate cancer:     Hospital Course: Dennis Savage is a 89 y.o. male with medical history significant of paroxysmal atrial fibrillation on Xarelto , HFpEF, COPD, prostate cancer (on palliative therapy), CKD III, HTN, GERD, and subdural hygroma presenting with acute respiratory failure with hypoxia, acute on chronic HFpEF, volume overload.  History primarily from family.  Per report, patient with increased work of breathing for 2 to 3 days before presentation.  Noted to have been recently admitted March 2025 in the [facility] system for similar issues.  Family reports worsening shortness of breath at home.  Has been using low-dose Lasix  intermittently.  Still with worsening lower extremity swelling and rales in the lungs.  Patient was subsequently admitted for acute on chronic diastolic congestive heart failure requiring IV Lasix .  Respiratory function improved and patient underwent physical therapy with recommendation for discharge to skilled nursing facility. Patient also with complaint of dysuria with no improvement after pyridium  x 3 doses. Completed ceftin  course. Urine cx is finalized showing insignificant growth.  This was discussed with urologist Dr. Inga Manges who has recommended trial of tamsulosin  and patient will follow-up as an outpatient.     Consultants: Urology, cardiology Procedures performed: As mentioned above Disposition: Skilled nursing facility Diet recommendation:  Cardiac  diet DISCHARGE MEDICATION: Allergies as of 08/22/2023       Reactions   Lisinopril Cough   Penicillins Hives, Swelling   IgE = 17 ((WNL) on 02/08/2023        Medication List     STOP taking these medications    amLODipine  10 MG tablet Commonly known as: NORVASC    potassium chloride  10 MEQ tablet Commonly known as: KLOR-CON        TAKE these medications    acetaminophen  325 MG tablet Commonly known as: TYLENOL  Take 650 mg by mouth every 6 (six) hours as needed.   albuterol  108 (90 Base) MCG/ACT inhaler Commonly known as: VENTOLIN  HFA Inhale 2 puffs into the lungs every 6 (six) hours as needed for wheezing or shortness of breath.   CALCIUM  600 + D PO Take 1 tablet by mouth daily.   cholecalciferol  25 MCG (1000 UNIT) tablet Commonly known as: VITAMIN D3 Take 1,000 Units by mouth daily.   docusate sodium  100 MG capsule Commonly known as: COLACE Take 100 mg by mouth 2 (two) times daily.   Fish Oil 300 MG Caps Take 300 mg by mouth daily.   fluticasone  50 MCG/ACT nasal spray Commonly known as: FLONASE  Place 1 spray into both nostrils 2 (two) times daily.   furosemide  40 MG tablet Commonly known as: LASIX  Take 1 tablet (40 mg total) by mouth daily. Start taking on: Aug 23, 2023   guaiFENesin  600 MG 12 hr tablet Commonly known as: MUCINEX  Take 600 mg by mouth 2 (two) times daily.   hydrOXYzine  10 MG tablet Commonly known as: ATARAX  Take 10 mg by mouth at bedtime as needed for itching.   Lactulose  20 GM/30ML Soln Take 30 mLs (20 g total) by mouth daily  as needed.   losartan  25 MG tablet Commonly known as: COZAAR  Take 0.5 tablets (12.5 mg total) by mouth daily. Start taking on: Aug 23, 2023   metoprolol  succinate 25 MG 24 hr tablet Commonly known as: TOPROL -XL Take 0.5 tablets (12.5 mg total) by mouth daily.   omeprazole 20 MG capsule Commonly known as: PRILOSEC Take 20 mg by mouth daily.   Orgovyx  120 MG tablet Generic drug: relugolix  Take 1  tablet (120 mg total) by mouth daily.   polyethylene glycol 17 g packet Commonly known as: MIRALAX  / GLYCOLAX  Take 17 g by mouth daily.   PRESERVISION AREDS 2 PO Take 1 capsule by mouth 2 (two) times daily.   Rivaroxaban  15 MG Tabs tablet Commonly known as: XARELTO  Take 1 tablet (15 mg total) by mouth daily with supper. What changed:  medication strength how much to take   spironolactone  25 MG tablet Commonly known as: ALDACTONE  Take 0.5 tablets (12.5 mg total) by mouth daily. Start taking on: Aug 23, 2023   tamsulosin  0.4 MG Caps capsule Commonly known as: FLOMAX  Take 1 capsule (0.4 mg total) by mouth daily. Start taking on: Aug 23, 2023        Follow-up Information     Custovic, Lanell Pinta, [place]. Go in 1 week(s).   Specialty: Cardiology Contact information: [address] [place] [address] [phone]         Homero Luster, MD. Call.   Specialty: Urology Contact information: [address] [place] [address] [phone]                Discharge Exam: Dennis Savage Weights   08/20/23 0526 08/21/23 0500 08/22/23 0500  Weight: 86.7 kg 85.4 kg 86.5 kg   General exam: Appears calm & comfortable Respiratory system: decreased breath sounds b/l  Cardiovascular system: irregularly irregular  Gastrointestinal system: abd is soft, NT, ND & hypoactive bowel sounds  Central nervous system: alert & oriented. Moves all extremities  Psychiatry: Judgement and insight appears at baseline. Flat mood and affect  Condition at discharge: good  The results of significant diagnostics from this hospitalization (including imaging, microbiology, ancillary and laboratory) are listed below for reference.   Imaging Studies: ECHOCARDIOGRAM COMPLETE Result Date: 08/10/2023    ECHOCARDIOGRAM REPORT   Patient Name:   Dennis Savage Date of Exam: 08/10/2023 Medical Rec #:  [id number]      Height:       69.0 in Accession #:    [id number]     Weight:       203.7 lb Date of Birth:   15-Nov-1929      BSA:          2.082 m Patient Age:    89 years       BP:           128/64 mmHg Patient Gender: M              HR:           68 bpm. Exam Location:  Inpatient Procedure: 2D Echo (Both Spectral and Color Flow Doppler were utilized during            procedure). Indications:     congestive heart failure  History:         Patient has prior history of Echocardiogram examinations, most                  recent 06/18/2021. Chronic kidney disease and COPD,  Arrythmias:Atrial Fibrillation, Signs/Symptoms:Shortness of                  Breath; Risk Factors:Former Smoker and Hypertension.  Sonographer:     Dione Franks RDCS Referring Phys:  [id number] CARALYN HUDSON Diagnosing Phys: Constancia Delton MD IMPRESSIONS  1. Left ventricular ejection fraction, by estimation, is 50 to 55%. The left ventricle has low normal function. The left ventricle has no regional wall motion abnormalities. There is moderate concentric left ventricular hypertrophy. Left ventricular diastolic parameters are indeterminate.  2. Right ventricular systolic function is normal. The right ventricular size is normal. There is normal pulmonary artery systolic pressure.  3. Left atrial size was severely dilated.  4. Right atrial size was severely dilated.  5. The mitral valve is normal in structure. Mild mitral valve regurgitation.  6. The aortic valve is tricuspid. Aortic valve regurgitation is mild. Aortic valve sclerosis/calcification is present, without any evidence of aortic stenosis.  7. Aortic dilatation noted. There is mild dilatation of the aortic root, measuring 40 mm.  8. The inferior vena cava is normal in size with greater than 50% respiratory variability, suggesting right atrial pressure of 3 mmHg. FINDINGS  Left Ventricle: Left ventricular ejection fraction, by estimation, is 50 to 55%. The left ventricle has low normal function. The left ventricle has no regional wall motion abnormalities. The left ventricular  internal cavity size was normal in size. There is moderate concentric left ventricular hypertrophy. Left ventricular diastolic parameters are indeterminate. Right Ventricle: The right ventricular size is normal. No increase in right ventricular wall thickness. Right ventricular systolic function is normal. There is normal pulmonary artery systolic pressure. The tricuspid regurgitant velocity is 2.81 m/s, and  with an assumed right atrial pressure of 3 mmHg, the estimated right ventricular systolic pressure is 34.6 mmHg. Left Atrium: Left atrial size was severely dilated. Right Atrium: Right atrial size was severely dilated. Pericardium: Trivial pericardial effusion is present. Mitral Valve: The mitral valve is normal in structure. Mild mitral valve regurgitation. Tricuspid Valve: The tricuspid valve is normal in structure. Tricuspid valve regurgitation is mild. Aortic Valve: The aortic valve is tricuspid. Aortic valve regurgitation is mild. Aortic valve sclerosis/calcification is present, without any evidence of aortic stenosis. Pulmonic Valve: The pulmonic valve was normal in structure. Pulmonic valve regurgitation is mild. Aorta: Aortic dilatation noted. There is mild dilatation of the aortic root, measuring 40 mm. Venous: The inferior vena cava is normal in size with greater than 50% respiratory variability, suggesting right atrial pressure of 3 mmHg. IAS/Shunts: No atrial level shunt detected by color flow Doppler.  LEFT VENTRICLE PLAX 2D LVIDd:         5.30 cm LVIDs:         3.90 cm LV PW:         1.50 cm LV IVS:        1.50 cm LVOT diam:     2.50 cm LV SV:         71 LV SV Index:   34 LVOT Area:     4.91 cm  RIGHT VENTRICLE             IVC RV Basal diam:  3.60 cm     IVC diam: 2.00 cm RV S prime:     11.40 cm/s LEFT ATRIUM              Index        RIGHT ATRIUM  Index LA diam:        6.00 cm  2.88 cm/m   RA Area:     32.80 cm LA Vol (A2C):   139.0 ml 66.76 ml/m  RA Volume:   109.00 ml 52.35 ml/m  LA Vol (A4C):   130.0 ml 62.43 ml/m LA Biplane Vol: 139.0 ml 66.76 ml/m  AORTIC VALVE LVOT Vmax:   76.50 cm/s LVOT Vmean:  49.300 cm/s LVOT VTI:    0.144 m  AORTA Ao Root diam: 4.00 cm Ao Asc diam:  3.80 cm TRICUSPID VALVE TR Peak grad:   31.6 mmHg TR Vmax:        281.00 cm/s  SHUNTS Systemic VTI:  0.14 m Systemic Diam: 2.50 cm Constancia Delton MD Electronically signed by Constancia Delton MD Signature Date/Time: 08/10/2023/2:56:08 PM    Final    DG Chest Port 1 View Result Date: 08/08/2023 CLINICAL DATA:  Shortness of breath EXAM: PORTABLE CHEST 1 VIEW COMPARISON:  04/28/2023 FINDINGS: Cardiac shadow is enlarged but stable. Aortic calcifications are again seen. Patchy airspace opacity is noted in the upper lobes bilaterally. Stable calcified granuloma is noted in the left mid lung. Small effusions are seen bilaterally. IMPRESSION: Multifocal infiltrate with small effusions bilaterally. Electronically Signed   By: Violeta Grey M.D.   On: 08/08/2023 09:49    Microbiology: Results for orders placed or performed during the hospital encounter of 08/08/23  Resp panel by RT-PCR (RSV, Flu A&B, Covid) Anterior Nasal Swab     Status: None   Collection Time: 08/08/23  9:07 AM   Specimen: Anterior Nasal Swab  Result Value Ref Range Status   SARS Coronavirus 2 by RT PCR NEGATIVE NEGATIVE Final    Comment: (NOTE) SARS-CoV-2 target nucleic acids are NOT DETECTED.  The SARS-CoV-2 RNA is generally detectable in upper respiratory specimens during the acute phase of infection. The lowest concentration of SARS-CoV-2 viral copies this assay can detect is 138 copies/mL. A negative result does not preclude SARS-Cov-2 infection and should not be used as the sole basis for treatment or other patient management decisions. A negative result may occur with  improper specimen collection/handling, submission of specimen other than nasopharyngeal swab, presence of viral mutation(s) within the areas targeted by this  assay, and inadequate number of viral copies(<138 copies/mL). A negative result must be combined with clinical observations, patient history, and epidemiological information. The expected result is Negative.  Fact Sheet for Patients:  BloggerCourse.com  Fact Sheet for Healthcare Providers:  SeriousBroker.it  This test is no t yet approved or cleared by the [place]  FDA and  has been authorized for detection and/or diagnosis of SARS-CoV-2 by FDA under an Emergency Use Authorization (EUA). This EUA will remain  in effect (meaning this test can be used) for the duration of the COVID-19 declaration under Section 564(b)(1) of the Act, 21 U.S.C.section 360bbb-3(b)(1), unless the authorization is terminated  or revoked sooner.       Influenza A by PCR NEGATIVE NEGATIVE Final   Influenza B by PCR NEGATIVE NEGATIVE Final    Comment: (NOTE) The Xpert Xpress SARS-CoV-2/FLU/RSV plus assay is intended as an aid in the diagnosis of influenza from Nasopharyngeal swab specimens and should not be used as a sole basis for treatment. Nasal washings and aspirates are unacceptable for Xpert Xpress SARS-CoV-2/FLU/RSV testing.  Fact Sheet for Patients: BloggerCourse.com  Fact Sheet for Healthcare Providers: SeriousBroker.it  This test is not yet approved or cleared by the [place]  FDA and has been authorized for  detection and/or diagnosis of SARS-CoV-2 by FDA under an Emergency Use Authorization (EUA). This EUA will remain in effect (meaning this test can be used) for the duration of the COVID-19 declaration under Section 564(b)(1) of the Act, 21 U.S.C. section 360bbb-3(b)(1), unless the authorization is terminated or revoked.     Resp Syncytial Virus by PCR NEGATIVE NEGATIVE Final    Comment: (NOTE) Fact Sheet for Patients: BloggerCourse.com  Fact Sheet for  Healthcare Providers: SeriousBroker.it  This test is not yet approved or cleared by the [place]  FDA and has been authorized for detection and/or diagnosis of SARS-CoV-2 by FDA under an Emergency Use Authorization (EUA). This EUA will remain in effect (meaning this test can be used) for the duration of the COVID-19 declaration under Section 564(b)(1) of the Act, 21 U.S.C. section 360bbb-3(b)(1), unless the authorization is terminated or revoked.  Performed at [facility], [address], [place], [place] [address]   Urine Culture (for pregnant, neutropenic or urologic patients or patients with an indwelling urinary catheter)     Status: Abnormal   Collection Time: 08/16/23  5:42 PM   Specimen: Urine, Clean Catch  Result Value Ref Range Status   Specimen Description   Final    URINE, CLEAN CATCH Performed at [facility], [address], [place], [place] [address]    Special Requests   Final    NONE Performed at [facility], [address], [place], [place] [address]    Culture (A)  Final    <10,000 COLONIES/mL INSIGNIFICANT GROWTH Performed at [facility], [address] [place], [place] [address]    Report Status 08/17/2023 FINAL  Final    Labs: CBC: Recent Labs  Lab 08/18/23 0522 08/19/23 0551 08/20/23 0503 08/21/23 0635 08/22/23 0604  WBC 9.2 8.5 7.9 7.2 7.7  NEUTROABS  --   --   --   --  4.9  HGB 11.3* 11.5* 10.8* 10.8* 10.8*  HCT 35.4* 35.8* 34.2* 34.0* 35.0*  MCV 86.6 86.3 87.0 85.2 90.0  PLT 350 362 364 373 351   Basic Metabolic Panel: Recent Labs  Lab 08/18/23 0522 08/19/23 0551 08/20/23 0503 08/21/23 0635 08/22/23 0604  NA 138 138 138 136 138  K 4.8 4.6 4.5 4.6 4.8  CL 99 99 102 102 103  CO2 29 28 27 26 24   GLUCOSE 117* 110* 113* 101* 97  BUN 46* 43* 47* 42* 42*  CREATININE 1.44* 1.44* 1.49* 1.49* 1.52*  CALCIUM  9.1 9.0 9.0 9.2 9.1   Liver Function Tests: No results for  input(s): "AST", "ALT", "ALKPHOS", "BILITOT", "PROT", "ALBUMIN" in the last 168 hours. CBG: No results for input(s): "GLUCAP" in the last 168 hours.  Discharge time spent:  36 minutes.  Signed: Ezzard Holms, MD [facility] 08/22/2023

## 2023-08-22 NOTE — Plan of Care (Signed)

## 2023-08-22 NOTE — Progress Notes (Signed)
 Progress Note   Patient: Dennis Savage:[id number] DOB: 09-03-1929 DOA: 08/08/2023     14 DOS: the patient was seen and examined on 08/22/2023     Brief hospital course:     HPI was taken from Dr. Daisey Dryer: Dennis Savage is a 89 y.o. male with medical history significant of paroxysmal atrial fibrillation on Xarelto , HFpEF, COPD, prostate cancer (on palliative therapy), CKD III, HTN, GERD, and subdural hygroma presenting with acute respiratory failure with hypoxia on BiPAP, acute on chronic HFpEF, volume overload.  History primarily from family.  Per report, patient with increased work of breathing with past 2 to 3 days.  Noted to have been recently admitted March 2025 in the [facility] system for similar issues.  Family reports worsening shortness of breath at home.  Has been using low-dose Lasix  intermittently.  Still with worsening lower extremity swelling and rales in the lungs.  Family reports that this is a recurring issue.  Has very narrow volume window.  No reported fevers or chills.  No reported wheezing or cough.  No reported chest pain or abdominal pain.  No reported high salt intake or NSAID use.     Assessment and Plan: Acute on chronic diastolic CHF exacerbation:  Continue on lasix , losartan , aldactone  & metoprolol  as per cardio. Monitor I/Os. Likely secondary to medication noncompliance as pt was not taking lasix  at home. Cardio signed off on 08/19/23    Demand ischemia: as per cardio. NSTEMI r/o. No further inpatient work-up recommended as per cardio    Dysuria: UA shows rare bacteria. No improvement w/ pyridium  x 3 doses. Complete ceftin  course. Urine cx is finalized showing insignificant growth.  Previous provider discussed pt w/ uro on call, Dr. Inga Manges, who recommend pt try tamsulosin  & uribel. Delories Fetter is not available at [facility] pharmacy.  Outpatient follow-up with urologist   COPD: w/o exacerbation.  Continue bronchodilators prn    Essential hypertension  Continue on metoprolol ,  losartan , & aldactone . D/c amlodipine      Chronic atrial fibrillation:  According to cardiology continue on metoprolol , xarelto . Will need f/u outpatient w/ cardio. Cardio signed off on 08/19/23    GERD: continue on PPI    CKDIIIa: Cr is labile. Avoid nephrotoxic meds   Pressure injury: continue w/ wound care   Prostate cancer: continue on home dose of relugolix  for palliation. Need to f/u outpatient w/ uro    DVT prophylaxis: xarelto     Code Status: DNR   Family Communication:   Status is: Inpatient Remains inpatient appropriate because: medically stable. Still needs SNF placement,     Subjective:    Patient seen and examined at bedside this morning Patient was initially planned for discharge today however facility unable to take patient today until tomorrow Denies nausea vomiting abdominal pain chest pain or cough   Physical Exam: General exam: Appears calm & comfortable Respiratory system: decreased breath sounds b/l  Cardiovascular system: irregularly irregular  Gastrointestinal system: abd is soft, NT, ND & hypoactive bowel sounds  Central nervous system: alert & oriented. Moves all extremities  Psychiatry: Judgement and insight appears at baseline. Flat mood and affect       Data Reviewed:    Latest Ref Rng & Units 08/22/2023    6:04 AM 08/21/2023    6:35 AM 08/20/2023    5:03 AM  CBC  WBC 4.0 - 10.5 K/uL 7.7  7.2  7.9   Hemoglobin 13.0 - 17.0 g/dL 81.1  91.4  78.2   Hematocrit 39.0 -  52.0 % 35.0  34.0  34.2   Platelets 150 - 400 K/uL 351  373  364        Latest Ref Rng & Units 08/22/2023    6:04 AM 08/21/2023    6:35 AM 08/20/2023    5:03 AM  BMP  Glucose 70 - 99 mg/dL 97  528  413   BUN 8 - 23 mg/dL 42  42  47   Creatinine 0.61 - 1.24 mg/dL 2.44  0.10  2.72   Sodium 135 - 145 mmol/L 138  136  138   Potassium 3.5 - 5.1 mmol/L 4.8  4.6  4.5   Chloride 98 - 111 mmol/L 103  102  102   CO2 22 - 32 mmol/L 24  26  27    Calcium  8.9 - 10.3 mg/dL 9.1  9.2  9.0       Vitals:   08/22/23 0345 08/22/23 0500 08/22/23 0819 08/22/23 1244  BP: 108/68  117/72 (!) 125/57  Pulse: 65  63 (!) 40  Resp: 17  16 16   Temp: 97.7 F (36.5 C)  99.8 F (37.7 C) 97.7 F (36.5 C)  TempSrc: Oral  Oral   SpO2: 94%  97% 98%  Weight:  86.5 kg    Height:         Author: Ezzard Holms, MD 08/22/2023 4:10 PM  For on call review [email or web address].

## 2023-08-22 NOTE — TOC Progression Note (Addendum)
 Transition of Care [facility]) - Progression Note    Patient Details  Name: Dennis Savage MRN: [id number] Date of Birth: 09-27-1929  [facility]) CM/SW Contact  Baird Bombard, RN Phone Number: 08/22/2023, 10:33 AM  Clinical Narrative:    Attempt to reach patient's daughter, Dennis Savage. No answer. Left a message.   11:45am Spoke with patient at bedside. He was advised he was medically ready for discharge per his MD. Patient stated he wasn't going anywhere because he did not have any facility to discharge to. Patient was advised he in fact had four bed offers. Patient was provided bed offers for [facility], [facility]. He stated he would not discharge today and called his daughter Dennis Savage. RNCM spoke to [place] to advise patient was discharge today per MD due to medical readiness. Dennis Savage stated she was unaware of discharge. RNCM advised of attempts to reach her sister, Dennis Savage for bed offers. She stated she would speak with Dennis Savage and have a choice soon. RNCM provided contact information.   12:05pm Retrieved call from patient's daughter, Dennis Savage stating patient is not discharging because she knew nothing about it. RNCM reiterated medical readiness is determined by the MD. She stated she had not made a choice of a facility. She was advised of previous attempts made to her for choice, facility tours and CMS guidelines for discharging to first available bed. She was advised patient's  right to appeal. She stated she would. She also stated she spoke with the director about patient's benefit and RNCM should have followed up on that. RNCM advised there was no conversation had with with any director yesterday regarding this patient's benefits. Dennis Savage requested to speak with MD. "He's not stable to discharge it's burning when he pees." Dennis Savage stated she would take patient home. She was ask for a choice of a HH agency. "I don't know any of them, and he's not going home. " Dennis Savage  stated she would call back and ended call.  12:15pm RNCM retrieved call from patient's daughter, Dennis Savage. She stated "You people have done nothing and I will come and get him tomorrow." Dennis Savage was advised patient will discharge today and was advised of choice needed for [facility]. "Fine I'll come get him today and you better have him ready in an hour!" Dennis Savage ended call. MD made aware.   3:45pm Per Willard Harman in admissions at facility patient would need to bring home Bipap and Orgovoxy.   3:50pm  Spoke with patient's daughter, Dennis Savage. Patient has not been on home BIPAP and will bring Orgovoyx  that was brought to hospital to facility. Dennis Savage stated she can not transport patient today but cn tomorrow. Willard Harman and MD made aware of discharge plan.       Expected Discharge Plan and Services                                               Social Determinants of Health (SDOH) Interventions SDOH Screenings   Food Insecurity: No Food Insecurity (08/10/2023)  Housing: Low Risk  (08/10/2023)  Transportation Needs: No Transportation Needs (08/10/2023)  Utilities: Not At Risk (08/10/2023)  Depression (PHQ2-9): Low Risk  (06/30/2021)  Financial Resource Strain: Low Risk  (06/07/2023)   Received from [facility]  Social Connections: Moderately Isolated (08/10/2023)  Tobacco Use: Medium Risk (08/08/2023)    Readmission  Risk Interventions    04/18/2023    1:48 PM  Readmission Risk Prevention Plan  Transportation Screening Complete  Medication Review (RN Care Manager) Complete  HRI or Home Care Consult Complete  Palliative Care Screening Not Applicable  Skilled Nursing Facility Complete

## 2023-08-23 DIAGNOSIS — I5033 Acute on chronic diastolic (congestive) heart failure: Secondary | ICD-10-CM | POA: Diagnosis not present

## 2023-08-23 LAB — CBC WITH DIFFERENTIAL/PLATELET
Abs Immature Granulocytes: 0.07 10*3/uL (ref 0.00–0.07)
Basophils Absolute: 0 10*3/uL (ref 0.0–0.1)
Basophils Relative: 0 %
Eosinophils Absolute: 0.1 10*3/uL (ref 0.0–0.5)
Eosinophils Relative: 2 %
HCT: 36.6 % — ABNORMAL LOW (ref 39.0–52.0)
Hemoglobin: 11.4 g/dL — ABNORMAL LOW (ref 13.0–17.0)
Immature Granulocytes: 1 %
Lymphocytes Relative: 28 %
Lymphs Abs: 1.9 10*3/uL (ref 0.7–4.0)
MCH: 27.1 pg (ref 26.0–34.0)
MCHC: 31.1 g/dL (ref 30.0–36.0)
MCV: 86.9 fL (ref 80.0–100.0)
Monocytes Absolute: 0.7 10*3/uL (ref 0.1–1.0)
Monocytes Relative: 10 %
Neutro Abs: 3.8 10*3/uL (ref 1.7–7.7)
Neutrophils Relative %: 59 %
Platelets: 380 10*3/uL (ref 150–400)
RBC: 4.21 MIL/uL — ABNORMAL LOW (ref 4.22–5.81)
RDW: 15.7 % — ABNORMAL HIGH (ref 11.5–15.5)
WBC: 6.6 10*3/uL (ref 4.0–10.5)
nRBC: 0 % (ref 0.0–0.2)

## 2023-08-23 LAB — BASIC METABOLIC PANEL WITH GFR
Anion gap: 12 (ref 5–15)
BUN: 40 mg/dL — ABNORMAL HIGH (ref 8–23)
CO2: 25 mmol/L (ref 22–32)
Calcium: 9.3 mg/dL (ref 8.9–10.3)
Chloride: 101 mmol/L (ref 98–111)
Creatinine, Ser: 1.42 mg/dL — ABNORMAL HIGH (ref 0.61–1.24)
GFR, Estimated: 47 mL/min — ABNORMAL LOW (ref >60)
Glucose, Bld: 115 mg/dL — ABNORMAL HIGH (ref 70–99)
Potassium: 4.4 mmol/L (ref 3.5–5.1)
Sodium: 138 mmol/L (ref 135–145)

## 2023-08-23 NOTE — TOC Transition Note (Signed)
 Transition of Care [facility] - Discharge Note   Patient Details  Name: Dennis Savage MRN: [id number] Date of Birth: [birth year]  Transition of Care [facility]) CM/SW Contact:  Surie Suchocki C Keanu Lesniak, RN Phone Number: 08/23/2023, 11:37 AM   Clinical Narrative:    Eldora Greet with Jearlean Mince from [facility] to advised patient is discharging today. Patient assigned to room E-9  Nurse will call report to [phone] Patient daughter will transport patient to facility  Discharge summary sent in the [facility] with patient's daughter regarding discharge today.  She will transport patient and has his Orgovoyx to provided for the facility.   TOC signing off.            Patient Goals and CMS Choice            Discharge Placement                       Discharge Plan and Services Additional resources added to the After Visit Summary for                                       Social Drivers of Health (SDOH) Interventions SDOH Screenings   Food Insecurity: No Food Insecurity (08/10/2023)  Housing: Low Risk  (08/10/2023)  Transportation Needs: No Transportation Needs (08/10/2023)  Utilities: Not At Risk (08/10/2023)  Depression (PHQ2-9): Low Risk  (06/30/2021)  Financial Resource Strain: Low Risk  (06/07/2023)   Received from [facility]  Social Connections: Moderately Isolated (08/10/2023)  Tobacco Use: Medium Risk (08/08/2023)     Readmission Risk Interventions    04/18/2023    1:48 PM  Readmission Risk Prevention Plan  Transportation Screening Complete  Medication Review (RN Care Manager) Complete  HRI or Home Care Consult Complete  Palliative Care Screening Not Applicable  Skilled Nursing Facility Complete

## 2023-08-23 NOTE — Discharge Summary (Signed)
 Physician Discharge Summary   Patient: JHOAN PIEROTTI MRN: [id number] DOB: 06/16/1929  Admit date:     08/08/2023  Discharge date: 08/23/23  Discharge Physician: Ezzard Holms   PCP: Little Riff, MD  Recommendations at discharge:  Follow-up with cardiology, urology as well as PCP   Discharge Diagnoses: Acute on chronic diastolic CHF exacerbation:  Demand ischemia:  Dysuria:  COPD: w/o exacerbation.  Essential hypertension  Chronic atrial fibrillation:  GERD: CKDIIIa:  Pressure injury: Prostate cancer:      Hospital Course: EMILEO SCHADLER is a 89 y.o. male with medical history significant of paroxysmal atrial fibrillation on Xarelto , HFpEF, COPD, prostate cancer (on palliative therapy), CKD III, HTN, GERD, and subdural hygroma presenting with acute respiratory failure with hypoxia, acute on chronic HFpEF, volume overload.  History primarily from family.  Per report, patient with increased work of breathing for 2 to 3 days before presentation.  Noted to have been recently admitted March 2025 in the [facility] system for similar issues.  Family reports worsening shortness of breath at home.  Has been using low-dose Lasix  intermittently.  Still with worsening lower extremity swelling and rales in the lungs.  Patient was subsequently admitted for acute on chronic diastolic congestive heart failure requiring IV Lasix .  Respiratory function improved and patient underwent physical therapy with recommendation for discharge to skilled nursing facility. Patient also with complaint of dysuria with no improvement after pyridium  x 3 doses. Completed ceftin  course. Urine cx is finalized showing insignificant growth.  This was discussed with urologist Dr. Inga Manges who has recommended trial of tamsulosin  and patient will follow-up as an outpatient.       Consultants: Urology, cardiology Procedures performed: As mentioned above Disposition: Skilled nursing facility Diet recommendation:  Cardiac  diet DISCHARGE MEDICATION: Allergies as of 08/23/2023       Reactions   Lisinopril Cough   Penicillins Hives, Swelling   IgE = 17 ((WNL) on 02/08/2023        Medication List     STOP taking these medications    amLODipine  10 MG tablet Commonly known as: NORVASC    potassium chloride  10 MEQ tablet Commonly known as: KLOR-CON        TAKE these medications    acetaminophen  325 MG tablet Commonly known as: TYLENOL  Take 650 mg by mouth every 6 (six) hours as needed.   albuterol  108 (90 Base) MCG/ACT inhaler Commonly known as: VENTOLIN  HFA Inhale 2 puffs into the lungs every 6 (six) hours as needed for wheezing or shortness of breath.   CALCIUM  600 + D PO Take 1 tablet by mouth daily.   cholecalciferol  25 MCG (1000 UNIT) tablet Commonly known as: VITAMIN D3 Take 1,000 Units by mouth daily.   docusate sodium  100 MG capsule Commonly known as: COLACE Take 100 mg by mouth 2 (two) times daily.   Fish Oil 300 MG Caps Take 300 mg by mouth daily.   fluticasone  50 MCG/ACT nasal spray Commonly known as: FLONASE  Place 1 spray into both nostrils 2 (two) times daily.   furosemide  40 MG tablet Commonly known as: LASIX  Take 1 tablet (40 mg total) by mouth daily.   guaiFENesin  600 MG 12 hr tablet Commonly known as: MUCINEX  Take 600 mg by mouth 2 (two) times daily.   hydrOXYzine  10 MG tablet Commonly known as: ATARAX  Take 10 mg by mouth at bedtime as needed for itching.   Lactulose  20 GM/30ML Soln Take 30 mLs (20 g total) by mouth daily as needed.  losartan  25 MG tablet Commonly known as: COZAAR  Take 0.5 tablets (12.5 mg total) by mouth daily.   metoprolol  succinate 25 MG 24 hr tablet Commonly known as: TOPROL -XL Take 0.5 tablets (12.5 mg total) by mouth daily.   omeprazole 20 MG capsule Commonly known as: PRILOSEC Take 20 mg by mouth daily.   Orgovyx  120 MG tablet Generic drug: relugolix  Take 1 tablet (120 mg total) by mouth daily.   polyethylene glycol 17  g packet Commonly known as: MIRALAX  / GLYCOLAX  Take 17 g by mouth daily.   PRESERVISION AREDS 2 PO Take 1 capsule by mouth 2 (two) times daily.   Rivaroxaban  15 MG Tabs tablet Commonly known as: XARELTO  Take 1 tablet (15 mg total) by mouth daily with supper. What changed:  medication strength how much to take   spironolactone  25 MG tablet Commonly known as: ALDACTONE  Take 0.5 tablets (12.5 mg total) by mouth daily.   tamsulosin  0.4 MG Caps capsule Commonly known as: FLOMAX  Take 1 capsule (0.4 mg total) by mouth daily.        Contact information for follow-up providers     Custovic, Sabina, DO. Go in 1 week(s).   Specialty: Cardiology Contact information: [address] [place] [address] [phone]         Homero Luster, MD. Call.   Specialty: Urology Contact information: [address] [place] [address] [phone]              Contact information for after-discharge care     Destination     [facility] .   Service: Skilled Nursing Contact information: [address] [place]  [address] [phone]                    Discharge Exam: Filed Weights   08/21/23 0500 08/22/23 0500 08/23/23 0500  Weight: 85.4 kg 86.5 kg 83.8 kg   General exam: Appears calm & comfortable Respiratory system: decreased breath sounds b/l  Cardiovascular system: irregularly irregular  Gastrointestinal system: abd is soft, NT, ND & hypoactive bowel sounds  Central nervous system: alert & oriented. Moves all extremities  Psychiatry: Judgement and insight appears at baseline. Flat mood and affect   Condition at discharge: good  The results of significant diagnostics from this hospitalization (including imaging, microbiology, ancillary and laboratory) are listed below for reference.   Imaging Studies: ECHOCARDIOGRAM COMPLETE Result Date: 08/10/2023    ECHOCARDIOGRAM REPORT   Patient Name:   MARKJOSEPH CHEE  Date of Exam: 08/10/2023 Medical Rec #:  [id number]      Height:       69.0 in Accession #:    [id number]     Weight:       203.7 lb Date of Birth:  October 15, 1929      BSA:          2.082 m Patient Age:    89 years       BP:           128/64 mmHg Patient Gender: M              HR:           68 bpm. Exam Location:  Inpatient Procedure: 2D Echo (Both Spectral and Color Flow Doppler were utilized during            procedure). Indications:     congestive heart failure  History:         Patient has prior  history of Echocardiogram examinations, most                  recent 06/18/2021. Chronic kidney disease and COPD,                  Arrythmias:Atrial Fibrillation, Signs/Symptoms:Shortness of                  Breath; Risk Factors:Former Smoker and Hypertension.  Sonographer:     Dione Franks RDCS Referring Phys:  [id number] CARALYN HUDSON Diagnosing Phys: Constancia Delton MD IMPRESSIONS  1. Left ventricular ejection fraction, by estimation, is 50 to 55%. The left ventricle has low normal function. The left ventricle has no regional wall motion abnormalities. There is moderate concentric left ventricular hypertrophy. Left ventricular diastolic parameters are indeterminate.  2. Right ventricular systolic function is normal. The right ventricular size is normal. There is normal pulmonary artery systolic pressure.  3. Left atrial size was severely dilated.  4. Right atrial size was severely dilated.  5. The mitral valve is normal in structure. Mild mitral valve regurgitation.  6. The aortic valve is tricuspid. Aortic valve regurgitation is mild. Aortic valve sclerosis/calcification is present, without any evidence of aortic stenosis.  7. Aortic dilatation noted. There is mild dilatation of the aortic root, measuring 40 mm.  8. The inferior vena cava is normal in size with greater than 50% respiratory variability, suggesting right atrial pressure of 3 mmHg. FINDINGS  Left Ventricle: Left ventricular ejection fraction, by  estimation, is 50 to 55%. The left ventricle has low normal function. The left ventricle has no regional wall motion abnormalities. The left ventricular internal cavity size was normal in size. There is moderate concentric left ventricular hypertrophy. Left ventricular diastolic parameters are indeterminate. Right Ventricle: The right ventricular size is normal. No increase in right ventricular wall thickness. Right ventricular systolic function is normal. There is normal pulmonary artery systolic pressure. The tricuspid regurgitant velocity is 2.81 m/s, and  with an assumed right atrial pressure of 3 mmHg, the estimated right ventricular systolic pressure is 34.6 mmHg. Left Atrium: Left atrial size was severely dilated. Right Atrium: Right atrial size was severely dilated. Pericardium: Trivial pericardial effusion is present. Mitral Valve: The mitral valve is normal in structure. Mild mitral valve regurgitation. Tricuspid Valve: The tricuspid valve is normal in structure. Tricuspid valve regurgitation is mild. Aortic Valve: The aortic valve is tricuspid. Aortic valve regurgitation is mild. Aortic valve sclerosis/calcification is present, without any evidence of aortic stenosis. Pulmonic Valve: The pulmonic valve was normal in structure. Pulmonic valve regurgitation is mild. Aorta: Aortic dilatation noted. There is mild dilatation of the aortic root, measuring 40 mm. Venous: The inferior vena cava is normal in size with greater than 50% respiratory variability, suggesting right atrial pressure of 3 mmHg. IAS/Shunts: No atrial level shunt detected by color flow Doppler.  LEFT VENTRICLE PLAX 2D LVIDd:         5.30 cm LVIDs:         3.90 cm LV PW:         1.50 cm LV IVS:        1.50 cm LVOT diam:     2.50 cm LV SV:         71 LV SV Index:   34 LVOT Area:     4.91 cm  RIGHT VENTRICLE             IVC RV Basal diam:  3.60 cm     IVC diam:  2.00 cm RV S prime:     11.40 cm/s LEFT ATRIUM              Index        RIGHT  ATRIUM           Index LA diam:        6.00 cm  2.88 cm/m   RA Area:     32.80 cm LA Vol (A2C):   139.0 ml 66.76 ml/m  RA Volume:   109.00 ml 52.35 ml/m LA Vol (A4C):   130.0 ml 62.43 ml/m LA Biplane Vol: 139.0 ml 66.76 ml/m  AORTIC VALVE LVOT Vmax:   76.50 cm/s LVOT Vmean:  49.300 cm/s LVOT VTI:    0.144 m  AORTA Ao Root diam: 4.00 cm Ao Asc diam:  3.80 cm TRICUSPID VALVE TR Peak grad:   31.6 mmHg TR Vmax:        281.00 cm/s  SHUNTS Systemic VTI:  0.14 m Systemic Diam: 2.50 cm Constancia Delton MD Electronically signed by Constancia Delton MD Signature Date/Time: 08/10/2023/2:56:08 PM    Final    DG Chest Port 1 View Result Date: 08/08/2023 CLINICAL DATA:  Shortness of breath EXAM: PORTABLE CHEST 1 VIEW COMPARISON:  04/28/2023 FINDINGS: Cardiac shadow is enlarged but stable. Aortic calcifications are again seen. Patchy airspace opacity is noted in the upper lobes bilaterally. Stable calcified granuloma is noted in the left mid lung. Small effusions are seen bilaterally. IMPRESSION: Multifocal infiltrate with small effusions bilaterally. Electronically Signed   By: Violeta Grey M.D.   On: 08/08/2023 09:49    Microbiology: Results for orders placed or performed during the hospital encounter of 08/08/23  Resp panel by RT-PCR (RSV, Flu A&B, Covid) Anterior Nasal Swab     Status: None   Collection Time: 08/08/23  9:07 AM   Specimen: Anterior Nasal Swab  Result Value Ref Range Status   SARS Coronavirus 2 by RT PCR NEGATIVE NEGATIVE Final    Comment: (NOTE) SARS-CoV-2 target nucleic acids are NOT DETECTED.  The SARS-CoV-2 RNA is generally detectable in upper respiratory specimens during the acute phase of infection. The lowest concentration of SARS-CoV-2 viral copies this assay can detect is 138 copies/mL. A negative result does not preclude SARS-Cov-2 infection and should not be used as the sole basis for treatment or other patient management decisions. A negative result may occur with   improper specimen collection/handling, submission of specimen other than nasopharyngeal swab, presence of viral mutation(s) within the areas targeted by this assay, and inadequate number of viral copies(<138 copies/mL). A negative result must be combined with clinical observations, patient history, and epidemiological information. The expected result is Negative.  Fact Sheet for Patients:  BloggerCourse.com  Fact Sheet for Healthcare Providers:  SeriousBroker.it  This test is no t yet approved or cleared by the [place]  FDA and  has been authorized for detection and/or diagnosis of SARS-CoV-2 by FDA under an Emergency Use Authorization (EUA). This EUA will remain  in effect (meaning this test can be used) for the duration of the COVID-19 declaration under Section 564(b)(1) of the Act, 21 U.S.C.section 360bbb-3(b)(1), unless the authorization is terminated  or revoked sooner.       Influenza A by PCR NEGATIVE NEGATIVE Final   Influenza B by PCR NEGATIVE NEGATIVE Final    Comment: (NOTE) The Xpert Xpress SARS-CoV-2/FLU/RSV plus assay is intended as an aid in the diagnosis of influenza from Nasopharyngeal swab specimens and should not be used as a sole  basis for treatment. Nasal washings and aspirates are unacceptable for Xpert Xpress SARS-CoV-2/FLU/RSV testing.  Fact Sheet for Patients: BloggerCourse.com  Fact Sheet for Healthcare Providers: SeriousBroker.it  This test is not yet approved or cleared by the [place]  FDA and has been authorized for detection and/or diagnosis of SARS-CoV-2 by FDA under an Emergency Use Authorization (EUA). This EUA will remain in effect (meaning this test can be used) for the duration of the COVID-19 declaration under Section 564(b)(1) of the Act, 21 U.S.C. section 360bbb-3(b)(1), unless the authorization is terminated or revoked.      Resp Syncytial Virus by PCR NEGATIVE NEGATIVE Final    Comment: (NOTE) Fact Sheet for Patients: BloggerCourse.com  Fact Sheet for Healthcare Providers: SeriousBroker.it  This test is not yet approved or cleared by the [place]  FDA and has been authorized for detection and/or diagnosis of SARS-CoV-2 by FDA under an Emergency Use Authorization (EUA). This EUA will remain in effect (meaning this test can be used) for the duration of the COVID-19 declaration under Section 564(b)(1) of the Act, 21 U.S.C. section 360bbb-3(b)(1), unless the authorization is terminated or revoked.  Performed at [facility], [address], [place], [place] [address]   Urine Culture (for pregnant, neutropenic or urologic patients or patients with an indwelling urinary catheter)     Status: Abnormal   Collection Time: 08/16/23  5:42 PM   Specimen: Urine, Clean Catch  Result Value Ref Range Status   Specimen Description   Final    URINE, CLEAN CATCH Performed at [facility], [address] [place], [place] [address]    Special Requests   Final    NONE Performed at [facility], [address] [place], [place] [address]    Culture (A)  Final    <10,000 COLONIES/mL INSIGNIFICANT GROWTH Performed at [facility], [address] [place], [place] [address]    Report Status 08/17/2023 FINAL  Final    Labs: CBC: Recent Labs  Lab 08/19/23 0551 08/20/23 0503 08/21/23 5284 08/22/23 0604 08/23/23 0528  WBC 8.5 7.9 7.2 7.7 6.6  NEUTROABS  --   --   --  4.9 3.8  HGB 11.5* 10.8* 10.8* 10.8* 11.4*  HCT 35.8* 34.2* 34.0* 35.0* 36.6*  MCV 86.3 87.0 85.2 90.0 86.9  PLT 362 364 373 351 380   Basic Metabolic Panel: Recent Labs  Lab 08/19/23 0551 08/20/23 0503 08/21/23 0635 08/22/23 0604 08/23/23 0528  NA 138 138 136 138 138  K 4.6 4.5 4.6 4.8 4.4  CL 99 102 102 103 101  CO2 28 27 26 24 25   GLUCOSE  110* 113* 101* 97 115*  BUN 43* 47* 42* 42* 40*  CREATININE 1.44* 1.49* 1.49* 1.52* 1.42*  CALCIUM  9.0 9.0 9.2 9.1 9.3   Liver Function Tests: No results for input(s): "AST", "ALT", "ALKPHOS", "BILITOT", "PROT", "ALBUMIN" in the last 168 hours. CBG: No results for input(s): "GLUCAP" in the last 168 hours.  Discharge time spent:  38 minutes.  Signed: Ezzard Holms, MD [facility] 08/23/2023

## 2023-08-23 NOTE — Plan of Care (Signed)

## 2023-09-04 ENCOUNTER — Ambulatory Visit: Admitting: Urology

## 2023-09-20 ENCOUNTER — Ambulatory Visit: Admitting: Urology

## 2023-10-04 ENCOUNTER — Ambulatory Visit: Admitting: Physician Assistant

## 2023-10-04 ENCOUNTER — Encounter: Payer: Self-pay | Admitting: Physician Assistant

## 2023-10-04 VITALS — BP 138/60 | HR 46 | Ht 69.5 in | Wt 185.0 lb

## 2023-10-04 DIAGNOSIS — R350 Frequency of micturition: Secondary | ICD-10-CM | POA: Diagnosis not present

## 2023-10-04 DIAGNOSIS — N471 Phimosis: Secondary | ICD-10-CM

## 2023-10-04 DIAGNOSIS — C61 Malignant neoplasm of prostate: Secondary | ICD-10-CM | POA: Diagnosis not present

## 2023-10-04 DIAGNOSIS — R300 Dysuria: Secondary | ICD-10-CM | POA: Diagnosis not present

## 2023-10-04 DIAGNOSIS — N401 Enlarged prostate with lower urinary tract symptoms: Secondary | ICD-10-CM

## 2023-10-04 LAB — MICROSCOPIC EXAMINATION

## 2023-10-04 LAB — URINALYSIS, COMPLETE
Bilirubin, UA: NEGATIVE
Glucose, UA: NEGATIVE
Ketones, UA: NEGATIVE
Leukocytes,UA: NEGATIVE
Nitrite, UA: NEGATIVE
RBC, UA: NEGATIVE
Specific Gravity, UA: 1.015 (ref 1.005–1.030)
Urobilinogen, Ur: 0.2 mg/dL (ref 0.2–1.0)
pH, UA: 6 (ref 5.0–7.5)

## 2023-10-04 LAB — BLADDER SCAN AMB NON-IMAGING

## 2023-10-04 NOTE — Progress Notes (Unsigned)
 10/04/2023 2:14 PM   [name] 04-08-1930 [id number]  CC: Chief Complaint  Patient presents with   Urinary Frequency   HPI: Dennis Savage is a 89 y.o. male with PMH clinically diagnosed metastatic prostate cancer on palliative ADT with Orgovyx , small right lower pole renal mass, and left adrenal mass on surveillance who presents today for evaluation of dysuria.  He is accompanied today by his daughter, Dennis Savage, who contributes to HPI.  Today he reports several months of dysuria that can be so severe that it interferes with his voiding.  He has been treated with multiple rounds of antibiotics for possible UTI, most recently about 2 weeks ago.  His dysuria has slightly improved, but not fully resolved.  He denies gross hematuria.  He remains on Flomax  and is tolerating it well without orthostasis.  Notably, his most recent PSA in February had increased slightly to 3.7.  Dr. Ace Holder plan for repeat PSA about a month later, but this was missed due to hospitalization.  In-office UA today positive for 1+ protein; urine microscopy pan negative. PVR >66mL.  PMH: 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   Apnea, sleep 07/13/2015   Arthralgia of multiple joints 06/15/2011   Arthritis    Artificial lens present 12/08/2010   Atrial fibrillation (HCC)    Benign essential HTN 11/03/2010   CHF (congestive heart failure) (HCC)    Chronic obstructive pulmonary disease (HCC) 11/03/2010   CN (constipation) 06/15/2011   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   Dizziness 02/25/2015   GERD (gastroesophageal reflux disease)    Heart murmur    Hypertension    Interstitial lung disease (HCC) 10/13/2013   LBP (low back pain) 11/03/2010   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   Peripheral vascular disease (HCC) 11/03/2010   Retinal hemorrhage 03/16/2014    Surgical History: Past Surgical  History:  Procedure Laterality Date   APPENDECTOMY     BACK SURGERY     CARPAL TUNNEL RELEASE Bilateral    EYE SURGERY Bilateral    Cataract Extraction with IOL   HERNIA REPAIR     Umbilical Hernia X 3   JOINT REPLACEMENT     bilat knees   NASAL SINUS SURGERY     REPLACEMENT TOTAL KNEE Bilateral    SHOULDER SURGERY Right    TONSILLECTOMY     TOTAL HIP ARTHROPLASTY Right 10/12/2015   Procedure: TOTAL HIP ARTHROPLASTY;  Surgeon: Arlyne Lame, MD;  Location: [facility] ORS;  Service: Orthopedics;  Laterality: Right;   TOTAL HIP ARTHROPLASTY Left 02/15/2023   Procedure: TOTAL HIP ARTHROPLASTY;  Surgeon: Arlyne Lame, MD;  Location: [facility] ORS;  Service: Orthopedics;  Laterality: Left;    Home Medications:  Allergies as of 10/04/2023       Reactions   Lisinopril Cough   Penicillins Hives, Swelling   IgE = 17 ((WNL) on 02/08/2023        Medication List        Accurate as of October 04, 2023  2:14 PM. If you have any questions, ask your nurse or doctor.          acetaminophen  325 MG tablet Commonly known as: TYLENOL  Take 650 mg by mouth every 6 (six) hours as needed.   albuterol  108 (90 Base) MCG/ACT inhaler Commonly known as: VENTOLIN  HFA Inhale 2 puffs into the lungs every 6 (six) hours as needed for wheezing or shortness  of breath.   CALCIUM  600 + D PO Take 1 tablet by mouth daily.   cholecalciferol  25 MCG (1000 UNIT) tablet Commonly known as: VITAMIN D3 Take 1,000 Units by mouth daily.   docusate sodium  100 MG capsule Commonly known as: COLACE Take 100 mg by mouth 2 (two) times daily.   Fish Oil 300 MG Caps Take 300 mg by mouth daily.   fluticasone  50 MCG/ACT nasal spray Commonly known as: FLONASE  Place 1 spray into both nostrils 2 (two) times daily.   furosemide  40 MG tablet Commonly known as: LASIX  Take 1 tablet (40 mg total) by mouth daily.   guaiFENesin  600 MG 12 hr tablet Commonly known as: MUCINEX  Take 600 mg by mouth 2 (two) times daily.    hydrOXYzine  10 MG tablet Commonly known as: ATARAX  Take 10 mg by mouth at bedtime as needed for itching.   Lactulose  20 GM/30ML Soln Take 30 mLs (20 g total) by mouth daily as needed.   losartan  25 MG tablet Commonly known as: COZAAR  Take 0.5 tablets (12.5 mg total) by mouth daily.   metoprolol  succinate 25 MG 24 hr tablet Commonly known as: TOPROL -XL Take 0.5 tablets (12.5 mg total) by mouth daily.   omeprazole 20 MG capsule Commonly known as: PRILOSEC Take 20 mg by mouth daily.   Orgovyx  120 MG tablet Generic drug: relugolix  Take 1 tablet (120 mg total) by mouth daily.   polyethylene glycol 17 g packet Commonly known as: MIRALAX  / GLYCOLAX  Take 17 g by mouth daily.   PRESERVISION AREDS 2 PO Take 1 capsule by mouth 2 (two) times daily.   Rivaroxaban  15 MG Tabs tablet Commonly known as: XARELTO  Take 1 tablet (15 mg total) by mouth daily with supper.   spironolactone  25 MG tablet Commonly known as: ALDACTONE  Take 0.5 tablets (12.5 mg total) by mouth daily.   tamsulosin  0.4 MG Caps capsule Commonly known as: FLOMAX  Take 1 capsule (0.4 mg total) by mouth daily.        Allergies:  Allergies  Allergen Reactions   Lisinopril Cough   Penicillins Hives and Swelling    IgE = 17 ((WNL) on 02/08/2023    Family History: Family History  Problem Relation Age of Onset   Bladder Cancer Neg Hx    Kidney cancer Neg Hx    Prostate cancer Neg Hx     Social History:   reports that he has quit smoking. His smoking use included cigarettes. He has never used smokeless tobacco. He reports that he does not drink alcohol and does not use drugs.  Physical Exam: BP 138/60   Pulse (!) 46   Ht 5' 9.5 (1.765 m)   Wt 185 lb (83.9 kg)   BMI 26.93 kg/m   Constitutional:  Alert, no acute distress, nontoxic appearing HEENT: [place], AT Cardiovascular: No clubbing, cyanosis, or edema Respiratory: Normal respiratory effort, no increased work of breathing GU: Phimotic foreskin,  unable to retract or visualize the meatus Skin: No rashes, bruises or suspicious lesions Neurologic: Grossly intact, no focal deficits, moving all 4 extremities Psychiatric: Normal mood and affect  Laboratory Data: Results for orders placed or performed in visit on 10/04/23  Bladder Scan (Post Void Residual) in office   Collection Time: 10/04/23  1:43 PM  Result Value Ref Range   Scan Result >96ml    Assessment & Plan:   1. Dysuria (Primary) UA bland and he is emptying reasonably well.  Unclear etiology.  If PSA is stable as below, will consider  cystoscopy for further evaluation. - Urinalysis, Complete - Bladder Scan (Post Void Residual) in office  2. Prostate cancer [facility]) Overdue for repeat PSA, question possible contribution to dysuria as above.  Will contact him with results.  Continue Orgovyx  in the meantime. - PSA  3. Phimosis Not bothersome.  UA bland.  We discussed that if he has frequent culture positive UTIs, we may consider circumcision versus dorsal slit.  He is going to be at high risk for contaminated urine samples due to this.  Return for Will call with results.  Kathreen Pare, PA-C  [facility] [address] [place], [place] [address] [phone]

## 2023-10-05 LAB — PSA: Prostate Specific Ag, Serum: 55.7 ng/mL — ABNORMAL HIGH (ref 0.0–4.0)

## 2023-10-08 ENCOUNTER — Ambulatory Visit: Payer: Self-pay | Admitting: Physician Assistant

## 2023-10-08 DIAGNOSIS — C61 Malignant neoplasm of prostate: Secondary | ICD-10-CM

## 2023-10-11 ENCOUNTER — Inpatient Hospital Stay

## 2023-10-11 ENCOUNTER — Inpatient Hospital Stay: Admitting: Oncology

## 2023-10-16 ENCOUNTER — Other Ambulatory Visit

## 2023-10-16 ENCOUNTER — Inpatient Hospital Stay: Attending: Oncology | Admitting: Oncology

## 2023-10-16 ENCOUNTER — Encounter: Payer: Self-pay | Admitting: Oncology

## 2023-10-16 VITALS — BP 141/61 | HR 63 | Temp 97.2°F | Resp 16 | Ht 69.0 in | Wt 185.0 lb

## 2023-10-16 DIAGNOSIS — D649 Anemia, unspecified: Secondary | ICD-10-CM | POA: Insufficient documentation

## 2023-10-16 DIAGNOSIS — I130 Hypertensive heart and chronic kidney disease with heart failure and stage 1 through stage 4 chronic kidney disease, or unspecified chronic kidney disease: Secondary | ICD-10-CM | POA: Diagnosis not present

## 2023-10-16 DIAGNOSIS — C61 Malignant neoplasm of prostate: Secondary | ICD-10-CM | POA: Diagnosis not present

## 2023-10-16 DIAGNOSIS — R972 Elevated prostate specific antigen [PSA]: Secondary | ICD-10-CM

## 2023-10-16 DIAGNOSIS — C775 Secondary and unspecified malignant neoplasm of intrapelvic lymph nodes: Secondary | ICD-10-CM | POA: Diagnosis not present

## 2023-10-16 DIAGNOSIS — Z7901 Long term (current) use of anticoagulants: Secondary | ICD-10-CM | POA: Diagnosis not present

## 2023-10-16 DIAGNOSIS — Z87891 Personal history of nicotine dependence: Secondary | ICD-10-CM | POA: Diagnosis not present

## 2023-10-16 DIAGNOSIS — N1832 Chronic kidney disease, stage 3b: Secondary | ICD-10-CM

## 2023-10-16 DIAGNOSIS — I4891 Unspecified atrial fibrillation: Secondary | ICD-10-CM | POA: Insufficient documentation

## 2023-10-16 DIAGNOSIS — N1831 Chronic kidney disease, stage 3a: Secondary | ICD-10-CM | POA: Diagnosis not present

## 2023-10-16 DIAGNOSIS — N189 Chronic kidney disease, unspecified: Secondary | ICD-10-CM | POA: Insufficient documentation

## 2023-10-16 LAB — CMP (CANCER CENTER ONLY)
ALT: 13 U/L (ref 0–44)
AST: 21 U/L (ref 15–41)
Albumin: 3.6 g/dL (ref 3.5–5.0)
Alkaline Phosphatase: 67 U/L (ref 38–126)
Anion gap: 7 (ref 5–15)
BUN: 36 mg/dL — ABNORMAL HIGH (ref 8–23)
CO2: 23 mmol/L (ref 22–32)
Calcium: 9.2 mg/dL (ref 8.9–10.3)
Chloride: 108 mmol/L (ref 98–111)
Creatinine: 1.51 mg/dL — ABNORMAL HIGH (ref 0.61–1.24)
GFR, Estimated: 43 mL/min — ABNORMAL LOW (ref >60)
Glucose, Bld: 103 mg/dL — ABNORMAL HIGH (ref 70–99)
Potassium: 3.7 mmol/L (ref 3.5–5.1)
Sodium: 138 mmol/L (ref 135–145)
Total Bilirubin: 1 mg/dL (ref 0.0–1.2)
Total Protein: 6.7 g/dL (ref 6.5–8.1)

## 2023-10-16 LAB — CBC WITH DIFFERENTIAL (CANCER CENTER ONLY)
Abs Immature Granulocytes: 0.03 10*3/uL (ref 0.00–0.07)
Basophils Absolute: 0 10*3/uL (ref 0.0–0.1)
Basophils Relative: 0 %
Eosinophils Absolute: 0.1 10*3/uL (ref 0.0–0.5)
Eosinophils Relative: 2 %
HCT: 37 % — ABNORMAL LOW (ref 39.0–52.0)
Hemoglobin: 11.7 g/dL — ABNORMAL LOW (ref 13.0–17.0)
Immature Granulocytes: 1 %
Lymphocytes Relative: 27 %
Lymphs Abs: 1.4 10*3/uL (ref 0.7–4.0)
MCH: 28.4 pg (ref 26.0–34.0)
MCHC: 31.6 g/dL (ref 30.0–36.0)
MCV: 89.8 fL (ref 80.0–100.0)
Monocytes Absolute: 0.5 10*3/uL (ref 0.1–1.0)
Monocytes Relative: 9 %
Neutro Abs: 3.3 10*3/uL (ref 1.7–7.7)
Neutrophils Relative %: 61 %
Platelet Count: 244 10*3/uL (ref 150–400)
RBC: 4.12 MIL/uL — ABNORMAL LOW (ref 4.22–5.81)
RDW: 16.6 % — ABNORMAL HIGH (ref 11.5–15.5)
WBC Count: 5.3 10*3/uL (ref 4.0–10.5)
nRBC: 0 % (ref 0.0–0.2)

## 2023-10-16 LAB — PSA: Prostatic Specific Antigen: 66.51 ng/mL — ABNORMAL HIGH (ref 0.00–4.00)

## 2023-10-16 NOTE — Assessment & Plan Note (Addendum)
 Hemoglobin has been stable.  Likely due to chronic kidney disease

## 2023-10-16 NOTE — Progress Notes (Signed)
 Patient is here for having burning sensation when he urinates.

## 2023-10-16 NOTE — Progress Notes (Signed)
 Hematology/Oncology Consult Note [phone] [phone]     REFERRING PROVIDER: Maurine Lukes,*    CHIEF COMPLAINTS/PURPOSE OF CONSULTATION:  Prostate cancer  ASSESSMENT & PLAN:   Prostate cancer (HCC) Presumed stage IVA prostate cancer with nodal metastatic disease to the right iliac, common iliac, external, and internal iliacs, along with a perirectal lesion.  Patient elected to defer biopsy previously. On androgen deprivation therapy with Orgovyx   PSA has significantly increased. Recommend repeat PSA, testosterone level. Check PSMA. Consider biopsy, radiation, adding androgen deprivation pathway inhibitors. Referred to palliative care service  Normocytic anemia Hemoglobin has been stable.  Likely due to chronic kidney disease  CKD stage 3a, GFR 45-59 ml/min (HCC) Encourage oral hydration and avoid nephrotoxins.    Orders Placed This Encounter  Procedures   NM PET (PSMA) SKULL TO MID THIGH    Standing Status:   Future    Expiration Date:   10/15/2024    If indicated for the ordered procedure, I authorize the administration of a radiopharmaceutical per Radiology protocol:   Yes    Preferred imaging location?:   [facility]   PSA    Standing Status:   Future    Number of Occurrences:   1    Expected Date:   10/16/2023    Expiration Date:   01/14/2024   Testosterone    Standing Status:   Future    Number of Occurrences:   1    Expected Date:   10/16/2023    Expiration Date:   01/14/2024   CBC with Differential (Cancer Center Only)    Standing Status:   Future    Number of Occurrences:   1    Expected Date:   10/16/2023    Expiration Date:   01/14/2024   CMP (Cancer Center only)    Standing Status:   Future    Number of Occurrences:   1    Expected Date:   10/16/2023    Expiration Date:   01/14/2024   Follow-up 1 week after PSMA. All questions were answered. The patient knows to call the clinic with any problems, questions or  concerns.  Zelphia Cap, MD, PhD [facility] Hematology Oncology 10/16/2023    HISTORY OF PRESENTING ILLNESS:  Dennis Savage 89 y.o. male presents to establish care for prostate cancer I have reviewed his chart and materials related to his cancer extensively and collaborated history with the patient. Summary of oncologic history is as follows: Oncology History  Prostate cancer (HCC)  02/09/2022 Imaging   PSMA PET scan showed 1. No discrete prostate carcinoma within the prostate gland. 2. PSMA avid prostate cancer nodal metastasis to the RIGHT iliac vessels including common iliac, external iliac and internal iliac. 3. Probable serosal implant along the RIGHT border of the rectum. 4. No evidence of metastatic adenopathy outside the pelvis. No visceral metastasis. 5. No evidence of skeletal metastasis. 6. interval enlargement of LEFT adrenal mass. Indeterminate on comparison MRI. Low FDG activity. No PSMA PET activity.   06/13/2022 Tumor Marker   PSA 0.2   01/18/2023 Tumor Marker   PSA 41.7.   05/22/2023 Tumor Marker   PSA 3.7   10/04/2023 Tumor Marker   PSA 55.7   10/16/2023 Initial Diagnosis   Prostate cancer [facility]) Previously managed by urology.  Dr. Penne Patient has presumed stage IV prostate cancer, nodal metastatic disease to the right iliac, common iliac, external, and internal iliacs, along with a perirectal lesion, elected to defer biopsy.  Patient has been  on androgen deprivation with Orgovyx    10/16/2023 Cancer Staging   Staging form: Prostate, AJCC 8th Edition - Clinical stage from 10/16/2023: Stage Unknown (rcTX, rcNX) - Signed by Babara Call, MD on 10/16/2023 Stage prefix: Recurrence    Patient has a past medical history of sleep apnea, arthritis, atrial fibrillation on Xarelto , history of CHF, COPD, GERD, interstitial lung disease peripheral vascular disease.  She also has a small right lower pole renal mass and left adrenal mass on surveillance. 04/22/2023, CT abdomen  pelvis without contrast showed enlarging left supra renal mass, concerning for relatively indolent adrenal neoplasm.  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  Patient has complained dysuria symptoms.  He is urinary and urinalysis were both negative.  PSA was tested which was found to be significantly elevated.  Patient has current left ankle recently and has had worse ankle swelling. Patient was accompanied by daughter.  MEDICAL HISTORY:  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   Apnea, sleep 07/13/2015   Arthralgia of multiple joints 06/15/2011   Arthritis    Artificial lens present 12/08/2010   Atrial fibrillation (HCC)    Benign essential HTN 11/03/2010   CHF (congestive heart failure) (HCC)    Chronic obstructive pulmonary disease (HCC) 11/03/2010   CN (constipation) 06/15/2011   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   Dizziness 02/25/2015   GERD (gastroesophageal reflux disease)    Heart murmur    Hypertension    Interstitial lung disease (HCC) 10/13/2013   LBP (low back pain) 11/03/2010   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   Peripheral vascular disease (HCC) 11/03/2010   Retinal hemorrhage 03/16/2014    SURGICAL HISTORY: Past Surgical History:  Procedure Laterality Date   APPENDECTOMY     BACK SURGERY     CARPAL TUNNEL RELEASE Bilateral    EYE SURGERY Bilateral    Cataract Extraction with IOL   HERNIA REPAIR     Umbilical Hernia X 3   JOINT REPLACEMENT     bilat knees   NASAL SINUS SURGERY     REPLACEMENT TOTAL KNEE Bilateral    SHOULDER SURGERY Right    TONSILLECTOMY     TOTAL HIP ARTHROPLASTY Right 10/12/2015   Procedure: TOTAL HIP ARTHROPLASTY;  Surgeon: Lynwood SHAUNNA Hue, MD;  Location: [facility] ORS;  Service: Orthopedics;  Laterality: Right;   TOTAL HIP ARTHROPLASTY Left 02/15/2023   Procedure: TOTAL HIP  ARTHROPLASTY;  Surgeon: Hue Lynwood SHAUNNA, MD;  Location: [facility] ORS;  Service: Orthopedics;  Laterality: Left;    SOCIAL HISTORY: Social History   Socioeconomic History   Marital status: Single    Spouse name: Not on file   Number of children: Not on file   Years of education: Not on file   Highest education level: Not on file  Occupational History   Not on file  Tobacco Use   Smoking status: Former    Current packs/day: 1.00    Types: Cigarettes   Smokeless tobacco: Never  Vaping Use   Vaping status: Never Used  Substance and Sexual Activity   Alcohol use: No   Drug use: No   Sexual activity: Not Currently  Other Topics Concern   Not on file  Social History Narrative   Not on file   Social Drivers of Health   Financial Resource Strain: Low Risk  (06/07/2023)   [facility]  Overall Financial Resource Strain (CARDIA)    Difficulty of Paying Living Expenses: Not hard at all  Food Insecurity: No Food Insecurity (10/16/2023)   Hunger Vital Sign    Worried About Running Out of Food in the Last Year: Never true    Ran Out of Food in the Last Year: Never true  Transportation Needs: No Transportation Needs (10/16/2023)   PRAPARE - Administrator, Civil Service (Medical): No    Lack of Transportation (Non-Medical): No  Physical Activity: Not on file  Stress: Not on file  Social Connections: Moderately Isolated (08/10/2023)   Social Connection and Isolation Panel    Frequency of Communication with Friends and Family: More than three times a week    Frequency of Social Gatherings with Friends and Family: More than three times a week    Attends Religious Services: More than 4 times per year    Active Member of [employer] or Organizations: No    Attends Banker Meetings: Never    Marital Status: Divorced  Catering manager Violence: Not At Risk (10/16/2023)   Humiliation, Afraid, Rape, and Kick questionnaire    Fear of Current or Ex-Partner: No     Emotionally Abused: No    Physically Abused: No    Sexually Abused: No    FAMILY HISTORY: Family History  Problem Relation Age of Onset   Bladder Cancer Neg Hx    Kidney cancer Neg Hx    Prostate cancer Neg Hx     ALLERGIES:  is allergic to lisinopril and penicillins.  MEDICATIONS:  Current Outpatient Medications  Medication Sig Dispense Refill   acetaminophen  (TYLENOL ) 325 MG tablet Take 650 mg by mouth every 6 (six) hours as needed.     albuterol  (VENTOLIN  HFA) 108 (90 Base) MCG/ACT inhaler Inhale 2 puffs into the lungs every 6 (six) hours as needed for wheezing or shortness of breath.     Calcium  Carb-Cholecalciferol  (CALCIUM  600 + D PO) Take 1 tablet by mouth daily.     cholecalciferol  (VITAMIN D3) 25 MCG (1000 UNIT) tablet Take 1,000 Units by mouth daily.     docusate sodium  (COLACE) 100 MG capsule Take 100 mg by mouth 2 (two) times daily.     fluticasone  (FLONASE ) 50 MCG/ACT nasal spray Place 1 spray into both nostrils 2 (two) times daily.     furosemide  (LASIX ) 40 MG tablet Take 1 tablet (40 mg total) by mouth daily.     guaiFENesin  (MUCINEX ) 600 MG 12 hr tablet Take 600 mg by mouth 2 (two) times daily.     hydrOXYzine  (ATARAX ) 10 MG tablet Take 10 mg by mouth at bedtime as needed for itching.     Lactulose  20 GM/30ML SOLN Take 30 mLs (20 g total) by mouth daily as needed. 450 mL 0   losartan  (COZAAR ) 25 MG tablet Take 0.5 tablets (12.5 mg total) by mouth daily.     metoprolol  succinate (TOPROL -XL) 25 MG 24 hr tablet Take 0.5 tablets (12.5 mg total) by mouth daily.     Multiple Vitamins-Minerals (PRESERVISION AREDS 2 PO) Take 1 capsule by mouth 2 (two) times daily.     Omega-3 Fatty Acids (FISH OIL) 300 MG CAPS Take 300 mg by mouth daily.     omeprazole (PRILOSEC) 20 MG capsule Take 20 mg by mouth daily.     polyethylene glycol (MIRALAX  / GLYCOLAX ) 17 g packet Take 17 g by mouth daily. 14 each 0   relugolix  (ORGOVYX ) 120 MG tablet Take 1  tablet (120 mg total) by mouth daily.  30 tablet 11   Rivaroxaban  (XARELTO ) 15 MG TABS tablet Take 1 tablet (15 mg total) by mouth daily with supper.     spironolactone  (ALDACTONE ) 25 MG tablet Take 0.5 tablets (12.5 mg total) by mouth daily.     tamsulosin  (FLOMAX ) 0.4 MG CAPS capsule Take 1 capsule (0.4 mg total) by mouth daily.     No current facility-administered medications for this visit.    Review of Systems  Constitutional:  Positive for fatigue. Negative for appetite change, chills, fever and unexpected weight change.  HENT:   Negative for hearing loss and voice change.   Eyes:  Negative for eye problems and icterus.  Respiratory:  Negative for chest tightness, cough and shortness of breath.   Cardiovascular:  Positive for leg swelling. Negative for chest pain.  Gastrointestinal:  Negative for abdominal distention and abdominal pain.  Endocrine: Negative for hot flashes.  Genitourinary:  Positive for dysuria. Negative for difficulty urinating and frequency.   Musculoskeletal:  Negative for arthralgias.  Skin:  Negative for itching and rash.  Neurological:  Negative for light-headedness and numbness.  Hematological:  Negative for adenopathy. Does not bruise/bleed easily.  Psychiatric/Behavioral:  Negative for confusion.      PHYSICAL EXAMINATION: ECOG PERFORMANCE STATUS: 3 - Symptomatic, >50% confined to bed  Vitals:   10/16/23 1117  BP: (!) 141/61  Pulse: 63  Resp: 16  Temp: (!) 97.2 F (36.2 C)  SpO2: 100%   Filed Weights   10/16/23 1117  Weight: 185 lb (83.9 kg)    Physical Exam Constitutional:      General: He is not in acute distress.    Comments: Patient states in the wheelchair  HENT:     Head: Normocephalic and atraumatic.  Eyes:     General: No scleral icterus. Cardiovascular:     Rate and Rhythm: Normal rate and regular rhythm.     Heart sounds: No murmur heard. Pulmonary:     Effort: Pulmonary effort is normal. No respiratory distress.     Breath sounds: No wheezing.     Comments:  Decreased breath sound bilaterally Abdominal:     General: Bowel sounds are normal. There is no distension.     Palpations: Abdomen is soft.     Tenderness: There is no abdominal tenderness.  Musculoskeletal:        General: Normal range of motion.     Cervical back: Normal range of motion and neck supple.     Right lower leg: Edema present.     Left lower leg: Edema present.  Skin:    General: Skin is warm and dry.     Findings: No erythema.  Neurological:     Mental Status: He is alert. Mental status is at baseline.     Cranial Nerves: No cranial nerve deficit.     Motor: No abnormal muscle tone.  Psychiatric:        Mood and Affect: Mood and affect normal.      LABORATORY DATA:  I have reviewed the data as listed    Latest Ref Rng & Units 10/16/2023   11:53 AM 08/23/2023    5:28 AM 08/22/2023    6:04 AM  CBC  WBC 4.0 - 10.5 K/uL 5.3  6.6  7.7   Hemoglobin 13.0 - 17.0 g/dL 88.2  88.5  89.1   Hematocrit 39.0 - 52.0 % 37.0  36.6  35.0   Platelets 150 - 400 K/uL 244  380  351       Latest Ref Rng & Units 10/16/2023   11:52 AM 08/23/2023    5:28 AM 08/22/2023    6:04 AM  CMP  Glucose 70 - 99 mg/dL 896  884  97   BUN 8 - 23 mg/dL 36  40  42   Creatinine 0.61 - 1.24 mg/dL 8.48  8.57  8.47   Sodium 135 - 145 mmol/L 138  138  138   Potassium 3.5 - 5.1 mmol/L 3.7  4.4  4.8   Chloride 98 - 111 mmol/L 108  101  103   CO2 22 - 32 mmol/L 23  25  24    Calcium  8.9 - 10.3 mg/dL 9.2  9.3  9.1   Total Protein 6.5 - 8.1 g/dL 6.7     Total Bilirubin 0.0 - 1.2 mg/dL 1.0     Alkaline Phos 38 - 126 U/L 67     AST 15 - 41 U/L 21     ALT 0 - 44 U/L 13        RADIOGRAPHIC STUDIES: I have personally reviewed the radiological images as listed and agreed with the findings in the report. No results found.

## 2023-10-16 NOTE — Assessment & Plan Note (Signed)
 Encourage oral hydration and avoid nephrotoxins.

## 2023-10-16 NOTE — Assessment & Plan Note (Addendum)
 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  Patient elected to defer biopsy previously. On androgen deprivation therapy with Orgovyx   PSA has significantly increased. Recommend repeat PSA, testosterone level. Check PSMA. Consider biopsy, radiation, adding androgen deprivation pathway inhibitors. Referred to palliative care service

## 2023-10-17 LAB — TESTOSTERONE: Testosterone: 8 ng/dL — ABNORMAL LOW (ref 264–916)

## 2023-11-06 ENCOUNTER — Ambulatory Visit
Admission: RE | Admit: 2023-11-06 | Discharge: 2023-11-06 | Disposition: A | Discharge status: Home / Self Care | Source: Ambulatory Visit | Attending: Oncology | Admitting: Oncology

## 2023-11-06 DIAGNOSIS — C61 Malignant neoplasm of prostate: Secondary | ICD-10-CM | POA: Insufficient documentation

## 2023-11-06 MED ORDER — FLOTUFOLASTAT F 18 GALLIUM 296-5846 MBQ/ML IV SOLN
8.0000 | Freq: Once | INTRAVENOUS | Status: AC
  Administered 2023-11-06: 8.5 via INTRAVENOUS
  Filled 2023-11-06: qty 8

## 2023-11-15 ENCOUNTER — Encounter: Payer: Self-pay | Admitting: Oncology

## 2023-11-15 ENCOUNTER — Inpatient Hospital Stay: Attending: Oncology | Admitting: Oncology

## 2023-11-15 VITALS — BP 121/53 | HR 51 | Temp 97.2°F | Resp 18 | Wt 186.7 lb

## 2023-11-15 DIAGNOSIS — I130 Hypertensive heart and chronic kidney disease with heart failure and stage 1 through stage 4 chronic kidney disease, or unspecified chronic kidney disease: Secondary | ICD-10-CM | POA: Diagnosis not present

## 2023-11-15 DIAGNOSIS — D649 Anemia, unspecified: Secondary | ICD-10-CM | POA: Insufficient documentation

## 2023-11-15 DIAGNOSIS — N1831 Chronic kidney disease, stage 3a: Secondary | ICD-10-CM | POA: Diagnosis not present

## 2023-11-15 DIAGNOSIS — C61 Malignant neoplasm of prostate: Secondary | ICD-10-CM | POA: Diagnosis not present

## 2023-11-15 DIAGNOSIS — C7951 Secondary malignant neoplasm of bone: Secondary | ICD-10-CM | POA: Diagnosis not present

## 2023-11-15 DIAGNOSIS — M899 Disorder of bone, unspecified: Secondary | ICD-10-CM

## 2023-11-15 NOTE — Progress Notes (Signed)
 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  Patient declines prostate biopsy.  He is also not interested in radiation therapy. Recommend patient to continue androgen deprivation therapy with Orgovyx   Add ARPI, recommend dose reduced Xtandi 120 mg daily.-Check insurance coverage is.  Rationale and potential side effects were reviewed with patient daughter and they agreed with the plan. Referred to palliative care service  CKD stage 3a, GFR 45-59 ml/min (HCC) Encourage oral hydration and avoid nephrotoxins.    Normocytic anemia Hemoglobin has been stable.  Likely due to chronic kidney disease  Bone lesion He is not interested in prostate radiation but is open to palliative radiation to bone lesions.  Refer to radiation oncology.   Orders Placed This Encounter  Procedures   Ambulatory referral to Radiation Oncology    Referral Priority:   Routine    Referral Type:   Consultation    Referral Reason:   Specialty Services Required    Requested Specialty:   Radiation Oncology    Number of Visits Requested:   1   Follow-up 1 to 2 weeks after starting on Xtandi. All questions were answered. The patient knows to call the clinic with any problems, questions or concerns.  Zelphia Cap, MD, PhD [facility] Hematology Oncology 11/15/2023    HISTORY OF PRESENTING ILLNESS:  Dennis Savage 89 y.o. male presents to establish care for prostate cancer I have reviewed his chart and materials related to his cancer extensively and collaborated history with  the patient. Summary of oncologic history is as follows: Oncology History  Prostate cancer (HCC)  02/09/2022 Imaging   PSMA PET scan showed 1. No discrete prostate carcinoma within the prostate gland. 2. PSMA avid prostate cancer nodal metastasis to the RIGHT iliac vessels including common iliac, external iliac and internal iliac. 3. Probable serosal implant along the RIGHT border of the rectum. 4. No evidence of metastatic adenopathy outside the pelvis. No visceral metastasis. 5. No evidence of skeletal metastasis. 6. interval enlargement of LEFT adrenal mass. Indeterminate on comparison MRI. Low FDG activity. No PSMA PET activity.   06/13/2022 Tumor Marker   PSA 0.2   01/18/2023 Tumor Marker   PSA 41.7.   05/22/2023 Tumor Marker   PSA 3.7   10/04/2023 Tumor Marker   PSA 55.7   10/16/2023 Initial Diagnosis   Prostate cancer [facility]) Previously managed by urology.  Dr. Penne Patient has presumed stage IV prostate cancer, nodal metastatic disease to the right iliac, common iliac, external, and internal iliacs, along with a perirectal lesion, elected to defer biopsy.  Patient has been on androgen deprivation with Orgovyx    10/16/2023 Cancer Staging   Staging form: Prostate, AJCC 8th Edition - Clinical stage from 10/16/2023: Stage Unknown (rcTX, rcNX) - Signed by Cap Zelphia, MD on 10/16/2023 Stage prefix: Recurrence   11/06/2023 Imaging   PSMA PET scan showed 1. New intense radiotracer activity in the RIGHT lobe of the prostate gland consistent with local prostate adenocarcinoma recurrence. 2. New intense radiotracer activity at the level of the RIGHT seminal vesicle consistent with local seminal vesicle invasion. 3. New large retroperitoneal lymph  node LEFT of the aorta with mild radiotracer activity. Findings concerning for nodal metastasis. 4. Several new small radiotracer avid SKELETAL METASTASIS. 5. Interval resolution of radiotracer avid RIGHT iliac nodes. 6. Stable irregular mass  extending from the lower pole of the RIGHT kidney. Stable marked enlargement LEFT adrenal gland. Findings concerning for renal and adrenal neoplasm. 7. Small bilateral pleural effusions.    Patient has a past medical history of sleep apnea, arthritis, atrial fibrillation on Xarelto , history of CHF, COPD, GERD, interstitial lung disease peripheral vascular disease.  he also has a small right lower pole renal mass and left adrenal mass on surveillance. 04/22/2023, CT abdomen pelvis without contrast showed enlarging left supra renal mass, concerning for relatively indolent adrenal neoplasm.  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  Patient has complained dysuria symptoms.  He takes Azo, previously urinary and urinalysis were both negative.  Accompanied by his daughter. + Back pain.  He takes Tylenol  as needed.    MEDICAL HISTORY:  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   Apnea, sleep 07/13/2015   Arthralgia of multiple joints 06/15/2011   Arthritis    Artificial lens present 12/08/2010   Atrial fibrillation (HCC)    Benign essential HTN 11/03/2010   CHF (congestive heart failure) (HCC)    Chronic obstructive pulmonary disease (HCC) 11/03/2010   CN (constipation) 06/15/2011   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   Dizziness 02/25/2015   GERD (gastroesophageal reflux disease)    Heart murmur    Hypertension    Interstitial lung disease (HCC) 10/13/2013   LBP (low back pain) 11/03/2010   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   Peripheral vascular disease (HCC) 11/03/2010   Retinal hemorrhage 03/16/2014    SURGICAL HISTORY: Past Surgical History:  Procedure Laterality Date   APPENDECTOMY     BACK SURGERY     CARPAL TUNNEL RELEASE Bilateral    EYE SURGERY Bilateral    Cataract Extraction with IOL   HERNIA REPAIR      Umbilical Hernia X 3   JOINT REPLACEMENT     bilat knees   NASAL SINUS SURGERY     REPLACEMENT TOTAL KNEE Bilateral    SHOULDER SURGERY Right    TONSILLECTOMY     TOTAL HIP ARTHROPLASTY Right 10/12/2015   Procedure: TOTAL HIP ARTHROPLASTY;  Surgeon: Lynwood SHAUNNA Hue, MD;  Location: [facility] ORS;  Service: Orthopedics;  Laterality: Right;   TOTAL HIP ARTHROPLASTY Left 02/15/2023   Procedure: TOTAL HIP ARTHROPLASTY;  Surgeon: Hue Lynwood SHAUNNA, MD;  Location: [facility] ORS;  Service: Orthopedics;  Laterality: Left;    SOCIAL HISTORY: Social History   Socioeconomic History   Marital status: Single    Spouse name: Not on file   Number of children: Not on file   Years of education: Not on file   Highest education level: Not on file  Occupational History   Not on file  Tobacco Use   Smoking status: Former    Current packs/day: 1.00    Types: Cigarettes   Smokeless tobacco: Never  Vaping Use   Vaping status: Never Used  Substance and Sexual Activity   Alcohol use: No   Drug use: No   Sexual activity: Not Currently  Other Topics Concern   Not on file  Social History Narrative   Not on file   Social Drivers of Health   Financial Resource Strain:  Low Risk  (10/29/2023)   Received from [facility]   Overall Financial Resource Strain (CARDIA)    Difficulty of Paying Living Expenses: Not hard at all  Food Insecurity: No Food Insecurity (10/29/2023)   Received from [facility]   Hunger Vital Sign    Within the past 12 months, you worried that your food would run out before you got the money to buy more.: Never true    Within the past 12 months, the food you bought just didn't last and you didn't have money to get more.: Never true  Transportation Needs: No Transportation Needs (10/29/2023)   Received from [facility] Transportation    In the past 12 months, has lack of transportation kept you from medical appointments or from  getting medications?: No    Lack of Transportation (Non-Medical): No  Physical Activity: Not on file  Stress: Not on file  Social Connections: Moderately Isolated (08/10/2023)   Social Connection and Isolation Panel    Frequency of Communication with Friends and Family: More than three times a week    Frequency of Social Gatherings with Friends and Family: More than three times a week    Attends Religious Services: More than 4 times per year    Active Member of [employer] or Organizations: No    Attends Banker Meetings: Never    Marital Status: Divorced  Catering manager Violence: Not At Risk (10/16/2023)   Humiliation, Afraid, Rape, and Kick questionnaire    Fear of Current or Ex-Partner: No    Emotionally Abused: No    Physically Abused: No    Sexually Abused: No    FAMILY HISTORY: Family History  Problem Relation Age of Onset   Bladder Cancer Neg Hx    Kidney cancer Neg Hx    Prostate cancer Neg Hx     ALLERGIES:  is allergic to lisinopril and penicillins.  MEDICATIONS:  Current Outpatient Medications  Medication Sig Dispense Refill   acetaminophen  (TYLENOL ) 325 MG tablet Take 650 mg by mouth every 6 (six) hours as needed.     albuterol  (VENTOLIN  HFA) 108 (90 Base) MCG/ACT inhaler Inhale 2 puffs into the lungs every 6 (six) hours as needed for wheezing or shortness of breath.     Calcium  Carb-Cholecalciferol  (CALCIUM  600 + D PO) Take 1 tablet by mouth daily.     cholecalciferol  (VITAMIN D3) 25 MCG (1000 UNIT) tablet Take 1,000 Units by mouth daily.     docusate sodium  (COLACE) 100 MG capsule Take 100 mg by mouth 2 (two) times daily.     fluticasone  (FLONASE ) 50 MCG/ACT nasal spray Place 1 spray into both nostrils 2 (two) times daily.     furosemide  (LASIX ) 40 MG tablet Take 1 tablet (40 mg total) by mouth daily.     guaiFENesin  (MUCINEX ) 600 MG 12 hr tablet Take 600 mg by mouth 2 (two) times daily.     hydrOXYzine  (ATARAX ) 10 MG tablet Take 10 mg by mouth at bedtime  as needed for itching.     Lactulose  20 GM/30ML SOLN Take 30 mLs (20 g total) by mouth daily as needed. 450 mL 0   losartan  (COZAAR ) 25 MG tablet Take 0.5 tablets (12.5 mg total) by mouth daily.     metoprolol  succinate (TOPROL -XL) 25 MG 24 hr tablet Take 0.5 tablets (12.5 mg total) by mouth daily.     Multiple Vitamins-Minerals (PRESERVISION AREDS 2 PO) Take 1 capsule by  mouth 2 (two) times daily.     Omega-3 Fatty Acids (FISH OIL) 300 MG CAPS Take 300 mg by mouth daily.     omeprazole (PRILOSEC) 20 MG capsule Take 20 mg by mouth daily.     polyethylene glycol (MIRALAX  / GLYCOLAX ) 17 g packet Take 17 g by mouth daily. 14 each 0   relugolix  (ORGOVYX ) 120 MG tablet Take 1 tablet (120 mg total) by mouth daily. 30 tablet 11   Rivaroxaban  (XARELTO ) 15 MG TABS tablet Take 1 tablet (15 mg total) by mouth daily with supper.     spironolactone  (ALDACTONE ) 25 MG tablet Take 0.5 tablets (12.5 mg total) by mouth daily.     tamsulosin  (FLOMAX ) 0.4 MG CAPS capsule Take 1 capsule (0.4 mg total) by mouth daily.     No current facility-administered medications for this visit.    Review of Systems  Constitutional:  Positive for fatigue. Negative for appetite change, chills, fever and unexpected weight change.  HENT:   Negative for hearing loss and voice change.   Eyes:  Negative for eye problems and icterus.  Respiratory:  Negative for chest tightness, cough and shortness of breath.   Cardiovascular:  Positive for leg swelling. Negative for chest pain.  Gastrointestinal:  Negative for abdominal distention and abdominal pain.  Endocrine: Negative for hot flashes.  Genitourinary:  Positive for dysuria. Negative for difficulty urinating and frequency.   Musculoskeletal:  Negative for arthralgias.  Skin:  Negative for itching and rash.  Neurological:  Negative for light-headedness and numbness.  Hematological:  Negative for adenopathy. Does not bruise/bleed easily.  Psychiatric/Behavioral:  Negative for  confusion.      PHYSICAL EXAMINATION: ECOG PERFORMANCE STATUS: 3 - Symptomatic, >50% confined to bed  Vitals:   11/15/23 1142  BP: (!) 121/53  Pulse: (!) 51  Resp: 18  Temp: (!) 97.2 F (36.2 C)   Filed Weights   11/15/23 1142  Weight: 186 lb 11.2 oz (84.7 kg)    Physical Exam Constitutional:      General: He is not in acute distress.    Comments: Patient states in the wheelchair  HENT:     Head: Normocephalic and atraumatic.  Eyes:     General: No scleral icterus. Cardiovascular:     Rate and Rhythm: Normal rate and regular rhythm.     Heart sounds: No murmur heard. Pulmonary:     Effort: Pulmonary effort is normal. No respiratory distress.     Breath sounds: No wheezing.     Comments: Decreased breath sound bilaterally Abdominal:     General: Bowel sounds are normal. There is no distension.     Palpations: Abdomen is soft.     Tenderness: There is no abdominal tenderness.  Musculoskeletal:        General: Normal range of motion.     Cervical back: Normal range of motion and neck supple.     Right lower leg: Edema present.     Left lower leg: Edema present.  Skin:    General: Skin is warm and dry.     Findings: No erythema.  Neurological:     Mental Status: He is alert. Mental status is at baseline.     Cranial Nerves: No cranial nerve deficit.     Motor: No abnormal muscle tone.  Psychiatric:        Mood and Affect: Mood and affect normal.      LABORATORY DATA:  I have reviewed the data as listed  Latest Ref Rng & Units 10/16/2023   11:53 AM 08/23/2023    5:28 AM 08/22/2023    6:04 AM  CBC  WBC 4.0 - 10.5 K/uL 5.3  6.6  7.7   Hemoglobin 13.0 - 17.0 g/dL 88.2  88.5  89.1   Hematocrit 39.0 - 52.0 % 37.0  36.6  35.0   Platelets 150 - 400 K/uL 244  380  351       Latest Ref Rng & Units 10/16/2023   11:52 AM 08/23/2023    5:28 AM 08/22/2023    6:04 AM  CMP  Glucose 70 - 99 mg/dL 896  884  97   BUN 8 - 23 mg/dL 36  40  42   Creatinine 0.61 - 1.24 mg/dL  8.48  8.57  8.47   Sodium 135 - 145 mmol/L 138  138  138   Potassium 3.5 - 5.1 mmol/L 3.7  4.4  4.8   Chloride 98 - 111 mmol/L 108  101  103   CO2 22 - 32 mmol/L 23  25  24    Calcium  8.9 - 10.3 mg/dL 9.2  9.3  9.1   Total Protein 6.5 - 8.1 g/dL 6.7     Total Bilirubin 0.0 - 1.2 mg/dL 1.0     Alkaline Phos 38 - 126 U/L 67     AST 15 - 41 U/L 21     ALT 0 - 44 U/L 13        RADIOGRAPHIC STUDIES: I have personally reviewed the radiological images as listed and agreed with the findings in the report. NM PET (PSMA) SKULL TO MID THIGH Result Date: 11/12/2023 CLINICAL DATA:  Prostate carcinoma with biochemical recurrence. EXAM: NUCLEAR MEDICINE PET SKULL BASE TO THIGH TECHNIQUE: 8.5 mCi Flotufolastat (Posluma ) was injected intravenously. Full-ring PET imaging was performed from the skull base to thigh after the radiotracer. CT data was obtained and used for attenuation correction and anatomic localization. COMPARISON:  PSA PET scan 02/07/2022 FINDINGS: NECK No radiotracer activity in neck lymph nodes. Incidental CT finding: None. CHEST No radiotracer accumulation within mediastinal or hilar lymph nodes. No suspicious pulmonary nodules on the CT scan. Incidental CT finding: Small bilateral pleural effusions ABDOMEN/PELVIS Prostate: New focal activity within the RIGHT lobe of the prostate gland SUV max 18. Three on image 142. There is intense radiotracer activity at the level of the RIGHT seminal vesicle SUV max equal 25. This activity is new from prior. Interval resolution of the previously enlarged radiotracer avid RIGHT iliac nodes. Large retroperitoneal lymph node LEFT of the aorta measuring 2.7 cm (105 with relatively mild activity (SUV max equal 7.0. Lymph nodes: No abnormal radiotracer accumulation within pelvic or abdominal nodes. Liver: No evidence of liver metastasis. Incidental CT finding: Several small liver cysts. Multiple renal cyst unchanged. There is irregular mass extending from the lower  pole of the RIGHT kidney measuring 5.3 cm (100/6). Marked enlargement LEFT adrenal gland to 7.6 cm unchanged from comparison CT 04/22/2023. These lesions of the RIGHT kidney and LEFT adrenal gland do not have radiotracer activity SKELETON Several small radiotracer avid lesions within the skeleton. Example lesion in the RIGHT sacral ala with SUV max equal 15 on image 120. This lesion measures less than 1 cm. Lesion in the cortex of the RIGHT humerus with SUV max equal 18.3 on image 68. Lesion in the T 5 vertebral body image 55 is radiotracer avid. L1 body lesion is intense radiotracer avid on image 19. IMPRESSION: 1. New intense radiotracer  activity in the RIGHT lobe of the prostate gland consistent with local prostate adenocarcinoma recurrence. 2. New intense radiotracer activity at the level of the RIGHT seminal vesicle consistent with local seminal vesicle invasion. 3. New large retroperitoneal lymph node LEFT of the aorta with mild radiotracer activity. Findings concerning for nodal metastasis. 4. Several new small radiotracer avid SKELETAL METASTASIS. 5. Interval resolution of radiotracer avid RIGHT iliac nodes. 6. Stable irregular mass extending from the lower pole of the RIGHT kidney. Stable marked enlargement LEFT adrenal gland. Findings concerning for renal and adrenal neoplasm. 7. Small bilateral pleural effusions. Electronically Signed   By: Jackquline Boxer M.D.   On: 11/12/2023 13:32

## 2023-11-15 NOTE — Assessment & Plan Note (Signed)
 Hemoglobin has been stable.  Likely due to chronic kidney disease

## 2023-11-15 NOTE — Assessment & Plan Note (Addendum)
 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  Patient declines prostate biopsy.  He is also not interested in radiation therapy. Recommend patient to continue androgen deprivation therapy with Orgovyx   Add ARPI, recommend dose reduced Xtandi 120 mg daily.-Check insurance coverage is.  Rationale and potential side effects were reviewed with patient daughter and they agreed with the plan. Referred to palliative care service

## 2023-11-15 NOTE — Assessment & Plan Note (Signed)
 Encourage oral hydration and avoid nephrotoxins.

## 2023-11-15 NOTE — Assessment & Plan Note (Signed)
 He is not interested in prostate radiation but is open to palliative radiation to bone lesions.  Refer to radiation oncology.

## 2023-11-18 ENCOUNTER — Other Ambulatory Visit (HOSPITAL_COMMUNITY): Payer: Self-pay

## 2023-11-18 ENCOUNTER — Encounter: Payer: Self-pay | Admitting: Urology

## 2023-11-18 ENCOUNTER — Telehealth: Payer: Self-pay | Admitting: Pharmacy Technician

## 2023-11-18 ENCOUNTER — Telehealth: Payer: Self-pay | Admitting: Pharmacist

## 2023-11-18 DIAGNOSIS — I480 Paroxysmal atrial fibrillation: Secondary | ICD-10-CM

## 2023-11-18 DIAGNOSIS — C61 Malignant neoplasm of prostate: Secondary | ICD-10-CM

## 2023-11-18 NOTE — Telephone Encounter (Signed)
 Oral Oncology Patient Advocate Encounter   Received notification that prior authorization for Xtandi  is required.   PA submitted on CMM on 11/18/2023 Key [id number] Status is pending     Veida Spira (Patty) Chet Burnet, CPhT  [facility], [place], [place] Oral Chemotherapy Patient Advocate Specialist III Phone: [phone]  Fax: [phone]

## 2023-11-18 NOTE — Telephone Encounter (Unsigned)
 Clinical Pharmacist Practitioner Encounter   Received new prescription for Xtandi  (enzalutamide ) for the treatment of stage IV castration resistant prostate cancer in conjunction with ADT (relugolix ), planned duration until disease progression or unacceptable drug toxicity.  Prescription dose and frequency assessed. MD plans to start patient on a reduced dose of 120mg   Current medication list in Epic reviewed, a few DDIs with enzalutamide  identified: Enzalutamide  may decrease the serum concentration of omeprazole. Monitor patient for decreased effectiveness of omeprazole. No baseline dose adjustment needed.  Edoxaban: Enzalutamide  may increase serum concentration of edoxaban. Monitor patients for evidence of bleeding. No baseline dose adjustment needed. Of note patient was switched from rivaroxaban  to edoxaban due to a category D interaction between rivaroxaban  and enzalutamide . Enzalutamide  may decrease serum concentration of rivaroxaban , so it is recommended to avoid that combination.   Evaluated chart and no patient barriers to medication adherence identified.   Prescription has been e-scribed to the [facility] for benefits analysis and approval.  Oral Oncology Clinic will continue to follow for insurance authorization, copayment issues, initial counseling and start date.   Brionne Mertz N. Kenslee Achorn, PharmD, BCOP, CPP Hematology/Oncology Clinical Pharmacist [facility] Oral Chemotherapy Navigation Clinic [phone]  11/18/2023 10:25 AM

## 2023-11-18 NOTE — Telephone Encounter (Signed)
 Oral Oncology Patient Advocate Encounter  Prior Authorization for Xtandi  has been approved.    PA# [id number] Effective dates: 04/17/2023 through 04/15/2024  Patients co-pay is $0.    [place] (Patty) Chet Burnet, CPhT  [facility], [place], [place] Oral Chemotherapy Patient Advocate Specialist III Phone: [phone]  Fax: [phone]

## 2023-11-19 ENCOUNTER — Telehealth: Payer: Self-pay

## 2023-11-19 MED ORDER — ENZALUTAMIDE 40 MG PO TABS
120.0000 mg | ORAL_TABLET | Freq: Every day | ORAL | 2 refills | Status: DC
Start: 2023-11-19 — End: 2024-02-06
  Filled 2023-11-21: qty 90, 30d supply, fill #0
  Filled 2023-12-13 (×2): qty 90, 30d supply, fill #1
  Filled 2024-01-14: qty 90, 30d supply, fill #2

## 2023-11-19 NOTE — Telephone Encounter (Signed)
 Patient has a drug interaction between rivaroxaban  (Xarelto ) and enzalutamide , so alternatives are recommended. Options include switching the anticoagulant to edoxaban or using a different oral chemotherapy. If abiraterone is chosen instead, spironolactone  must be stopped, but due to the patient's extensive cardiovascular disease, abiraterone is avoided. The plan is to switch the patient from rivaroxaban  to edoxaban for atrial fibrillation. Dr. Babara requests that cardiology, specifically Dr. Sabina Custovic, be contacted to discuss switching the anticoagulant.  Covering provider for Dr. Dewane was contacted and per Gabriella Decoste, PA-C Per Dr. Ammon can go ahead and switch to edoxaban. Sent reduced dose of 30 mg daily due to lower CrCl. sent to [facility] pharmacy on [address]

## 2023-11-20 NOTE — Telephone Encounter (Signed)
 Clinical Pharmacist Practitioner Encounter   Patient Education I spoke with patient's daughter [name] for overview of new oral chemotherapy medication: Xtandi  (enzalutamide ) for the treatment of stage IV castration resistant prostate cancer in conjunction with ADT (relugolix ), planned duration until disease progression or unacceptable drug toxicity.   Counseled [name] on administration, dosing, side effects, monitoring, drug-food interactions, safe handling, storage, and disposal. Patient will take 3 tablets (120 mg total) by mouth daily.   [name] knows that he will need to switch from rivaroxaban  to edoxaban before starting his enzalutamide .  Side effects include but not limited to: fatigue.    Reviewed with [name] importance of keeping a medication schedule and plan for any missed doses.  After discussion with [name] no patient barriers to medication adherence identified.   Distress evaluation: Distress thermometer not completed during telephone call as patient has been on previous lines of therapy.   [name] voiced understanding and appreciation. All questions answered. Medication handout provided.  Provided [name] with Oral Chemotherapy Navigation Clinic phone number. [name] knows to call the office with questions or concerns. Oral Chemotherapy Navigation Clinic will continue to follow.  Anajulia Leyendecker N. Lamarius Dirr, PharmD, BCOP, CPP Hematology/Oncology Clinical Pharmacist [facility] Oral Chemotherapy Navigation Clinic [phone]  11/20/2023 1:30 PM

## 2023-11-20 NOTE — Telephone Encounter (Signed)
 Oral Oncology Patient Advocate Encounter  Called to onboard patient with the pharmacy a total of three times, two have been with the patient directly and one time I tried calling his daughter's number.  The patient is hard of hearing and requested that oral chemo clinic speak with his daughter to set him up for delivery of his medication.I attempted to contact his daughter, as stated above, but she did not answer, I lvm for her to call me back and included my office hours.  Dennis Savage (Dennis) [name], CPhT  Dennis Savage, Dennis Savage, Dennis Savage Oral Chemotherapy Patient Advocate Specialist III Phone: [phone]  Fax: [phone]

## 2023-11-21 ENCOUNTER — Other Ambulatory Visit: Payer: Self-pay

## 2023-11-21 ENCOUNTER — Other Ambulatory Visit: Payer: Self-pay | Admitting: Pharmacy Technician

## 2023-11-21 ENCOUNTER — Telehealth: Payer: Self-pay | Admitting: Pharmacy Technician

## 2023-11-21 ENCOUNTER — Other Ambulatory Visit (HOSPITAL_COMMUNITY): Payer: Self-pay

## 2023-11-21 NOTE — Progress Notes (Unsigned)
 Specialty Pharmacy Initial Fill Coordination Note  Dennis Savage is a 89 y.o. male contacted today regarding refills of specialty medication(s) Enzalutamide  (XTANDI ) .  Patient requested Delivery  on 11/25/23  to verified address [address]  [place] [address] (leave at back door)   Medication will be filled on 11/22/2023.   Patient is aware of $0 copayment.   Lan Entsminger (Patty) Chet Burnet, CPhT  [facility], [place], [facility] Patient Advocate Specialist III Phone: [phone]  Fax: [phone]

## 2023-11-21 NOTE — Telephone Encounter (Signed)
 Patient successfully OnBoarded and drug education provided by pharmacist. Medication scheduled to be shipped on 11/22/2023 for delivery on 11/25/2023 from [facility] to patient's address. Patient also knows to call me at [phone] with any questions or concerns regarding receiving medication or if there is any unexpected change in co-pay.   Dennis Savage (Patty) Chet Burnet, CPhT  [facility], [place], Zelda [place]mon, [facility] Patient Advocate Specialist III Phone: [phone]  Fax: [phone]

## 2023-11-22 ENCOUNTER — Telehealth: Payer: Self-pay | Admitting: *Deleted

## 2023-11-22 NOTE — Progress Notes (Signed)
 Patient education documented in EPIC note on 11/22/23.

## 2023-11-22 NOTE — Telephone Encounter (Signed)
 Patient daughter called over wanting to know the medicine that he is going to be going on.  I told her that it is Xtandi .  Then she says that he cannot be on his quest with that medicine.   Dr. Ammon, him a new blood thinner named edoxaban .  Daughter saying that it cost $500 and insurance does not help at all for the medication.  I asked her that she should call over to Dr. Raynald office and see if there is anything else that they could use for him.  She will try.

## 2023-11-25 ENCOUNTER — Telehealth: Payer: Self-pay

## 2023-11-25 DIAGNOSIS — C61 Malignant neoplasm of prostate: Secondary | ICD-10-CM

## 2023-11-25 NOTE — Telephone Encounter (Signed)
-----   Message from Alyson N Leonard sent at 11/22/2023 10:34 AM EDT ----- Walterine! This patient will get stared on his Xtandi  on 11/25/23. ----- Message ----- From: Babara Call, MD Sent: 11/15/2023   8:57 PM EDT To: Almarie JINNY Nett, RN; Fonda KANDICE Sax, CMA#  Hi All,  I plan to start patient on Xtandi  120 mg daily.  Would you please check insurance approval.  He can start once he gets the supply.  I plan to  see him 1 to 2 weeks after starting on medication.  Lab MD cbc cmp PSA  Thank you  Call

## 2023-11-25 NOTE — Telephone Encounter (Signed)
 Please schedule appt 2 weeks after 8/11 and notify pt of appt details    lab /MD (cbc cmp PSA)

## 2023-11-29 ENCOUNTER — Telehealth: Payer: Self-pay | Admitting: Physician Assistant

## 2023-11-29 NOTE — Telephone Encounter (Signed)
 Received fax from [facility] that they have been unable to reach patient regarding refill of Orgovyx . They requested that we contact patient and ask them to contact [facility]. I called patient's daughter ([name]; who is on DPR), and gave her information. She stated she will call them today.

## 2023-12-13 ENCOUNTER — Other Ambulatory Visit: Payer: Self-pay

## 2023-12-13 ENCOUNTER — Other Ambulatory Visit: Payer: Self-pay | Admitting: Pharmacy Technician

## 2023-12-13 NOTE — Progress Notes (Signed)
 Specialty Pharmacy Ongoing Clinical Assessment Note  Dennis Savage is a 89 y.o. male who is being followed by the specialty pharmacy service for RxSp Oncology   Patient's specialty medication(s) reviewed today: Enzalutamide  (XTANDI )   Missed doses in the last 4 weeks: 0   Patient/Caregiver did not have any additional questions or concerns.   Therapeutic benefit summary: Unable to assess   Adverse events/side effects summary: Experienced adverse events/side effects (fatigue (better after taking it at night) and infrequent/short dizzy spells)   Patient's therapy is appropriate to: Continue    Goals Addressed             This Visit's Progress    Slow Disease Progression       Patient is unable to be assessed as therapy was recently initiated. Patient will maintain adherence         Follow up: 3 months  [facility] Specialty Pharmacist

## 2023-12-13 NOTE — Progress Notes (Signed)
 Specialty Pharmacy Refill Coordination Note  Dennis Savage is a 89 y.o. male contacted today regarding refills of specialty medication(s) Enzalutamide  (XTANDI )  Spoke with Family member  Patient requested Delivery   Delivery date: 12/20/23   Verified address: [address] [place]   Medication will be filled on 12/19/23.

## 2023-12-18 ENCOUNTER — Inpatient Hospital Stay: Attending: Oncology

## 2023-12-18 ENCOUNTER — Encounter: Payer: Self-pay | Admitting: Oncology

## 2023-12-18 ENCOUNTER — Inpatient Hospital Stay (HOSPITAL_BASED_OUTPATIENT_CLINIC_OR_DEPARTMENT_OTHER): Admitting: Oncology

## 2023-12-18 VITALS — BP 132/60 | HR 54 | Temp 97.6°F | Resp 16 | Wt 188.2 lb

## 2023-12-18 DIAGNOSIS — M899 Disorder of bone, unspecified: Secondary | ICD-10-CM

## 2023-12-18 DIAGNOSIS — D649 Anemia, unspecified: Secondary | ICD-10-CM | POA: Diagnosis not present

## 2023-12-18 DIAGNOSIS — C775 Secondary and unspecified malignant neoplasm of intrapelvic lymph nodes: Secondary | ICD-10-CM | POA: Diagnosis not present

## 2023-12-18 DIAGNOSIS — N1831 Chronic kidney disease, stage 3a: Secondary | ICD-10-CM | POA: Diagnosis not present

## 2023-12-18 DIAGNOSIS — C7951 Secondary malignant neoplasm of bone: Secondary | ICD-10-CM | POA: Insufficient documentation

## 2023-12-18 DIAGNOSIS — C61 Malignant neoplasm of prostate: Secondary | ICD-10-CM | POA: Insufficient documentation

## 2023-12-18 LAB — CBC WITH DIFFERENTIAL (CANCER CENTER ONLY)
Abs Immature Granulocytes: 0.05 10*3/uL (ref 0.00–0.07)
Basophils Absolute: 0 10*3/uL (ref 0.0–0.1)
Basophils Relative: 0 %
Eosinophils Absolute: 0.1 10*3/uL (ref 0.0–0.5)
Eosinophils Relative: 2 %
HCT: 39.1 % (ref 39.0–52.0)
Hemoglobin: 12.1 g/dL — ABNORMAL LOW (ref 13.0–17.0)
Immature Granulocytes: 1 %
Lymphocytes Relative: 21 %
Lymphs Abs: 1.4 10*3/uL (ref 0.7–4.0)
MCH: 28.1 pg (ref 26.0–34.0)
MCHC: 30.9 g/dL (ref 30.0–36.0)
MCV: 90.7 fL (ref 80.0–100.0)
Monocytes Absolute: 0.7 10*3/uL (ref 0.1–1.0)
Monocytes Relative: 11 %
Neutro Abs: 4.2 10*3/uL (ref 1.7–7.7)
Neutrophils Relative %: 65 %
Platelet Count: 202 10*3/uL (ref 150–400)
RBC: 4.31 MIL/uL (ref 4.22–5.81)
RDW: 14.9 % (ref 11.5–15.5)
WBC Count: 6.5 10*3/uL (ref 4.0–10.5)
nRBC: 0 % (ref 0.0–0.2)

## 2023-12-18 LAB — CMP (CANCER CENTER ONLY)
ALT: 10 U/L (ref 0–44)
AST: 22 U/L (ref 15–41)
Albumin: 3.5 g/dL (ref 3.5–5.0)
Alkaline Phosphatase: 75 U/L (ref 38–126)
Anion gap: 8 (ref 5–15)
BUN: 37 mg/dL — ABNORMAL HIGH (ref 8–23)
CO2: 24 mmol/L (ref 22–32)
Calcium: 9.4 mg/dL (ref 8.9–10.3)
Chloride: 108 mmol/L (ref 98–111)
Creatinine: 1.51 mg/dL — ABNORMAL HIGH (ref 0.61–1.24)
GFR, Estimated: 43 mL/min — ABNORMAL LOW (ref >60)
Glucose, Bld: 119 mg/dL — ABNORMAL HIGH (ref 70–99)
Potassium: 4.4 mmol/L (ref 3.5–5.1)
Sodium: 140 mmol/L (ref 135–145)
Total Bilirubin: 1.2 mg/dL (ref 0.0–1.2)
Total Protein: 5.9 g/dL — ABNORMAL LOW (ref 6.5–8.1)

## 2023-12-18 LAB — PSA: Prostatic Specific Antigen: 6.41 ng/mL — ABNORMAL HIGH (ref 0.00–4.00)

## 2023-12-18 NOTE — Assessment & Plan Note (Signed)
 Hemoglobin has been stable.  Likely due to chronic kidney disease

## 2023-12-18 NOTE — Assessment & Plan Note (Signed)
 Encourage oral hydration and avoid nephrotoxins.

## 2023-12-18 NOTE — Progress Notes (Signed)
 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  Patient declines prostate biopsy.  He is also not interested in radiation therapy. Recommend patient to continue androgen deprivation therapy with Orgovyx   He is on dose reduced Xtandi  120 mg daily.- so far he tolerated well.  Continue the current regimen.  Labs reviewed and discussed with patient.  Bone lesion He is not interested in prostate radiation but is open to palliative radiation to bone lesions.  Refer to radiation oncology. He has castration resistant disease.   Discussed about rationale potential side effects of bone stress management.  He agrees.  Will start him on Xgeva in the next visit.  CKD stage 3a, GFR 45-59 ml/min (HCC) Encourage oral hydration and avoid nephrotoxins.    Normocytic anemia Hemoglobin has been stable.  Likely due to chronic kidney disease   Orders Placed This Encounter  Procedures   CMP (Cancer Center only)    Standing Status:   Future    Expected Date:   01/17/2024    Expiration Date:   04/16/2024   CBC with Differential (Cancer Center Only)    Standing Status:   Future    Expected Date:   01/17/2024    Expiration Date:   04/16/2024   PSA    Standing Status:   Future    Expected Date:   01/17/2024    Expiration Date:   04/16/2024   Ambulatory referral to Radiation Oncology    Referral Priority:   Routine    Referral Type:   Consultation    Referral Reason:   Specialty Services Required    Requested Specialty:   Radiation Oncology     Number of Visits Requested:   1   Follow-up 1 month. All questions were answered. The patient knows to call the clinic with any problems, questions or concerns.  Zelphia Cap, MD, PhD [facility] Hematology Oncology 12/18/2023    HISTORY OF PRESENTING ILLNESS:  Dennis Savage 89 y.o. male presents to establish care for prostate cancer I have reviewed his chart and materials related to his cancer extensively and collaborated history with the patient. Summary of oncologic history is as follows: Oncology History  Prostate cancer (HCC)  02/09/2022 Imaging   PSMA PET scan showed 1. No discrete prostate carcinoma within the prostate gland. 2. PSMA avid prostate cancer nodal metastasis to the RIGHT iliac vessels including common iliac, external iliac and internal iliac. 3. Probable serosal implant along the RIGHT border of the rectum. 4. No evidence of metastatic adenopathy outside the pelvis. No visceral metastasis. 5. No evidence of skeletal metastasis. 6. interval enlargement of LEFT adrenal mass. Indeterminate on comparison MRI. Low FDG activity. No PSMA PET activity.   06/13/2022 Tumor Marker   PSA 0.2   01/18/2023 Tumor Marker   PSA 41.7.   05/22/2023 Tumor Marker   PSA 3.7   10/04/2023 Tumor Marker   PSA 55.7   10/16/2023 Initial Diagnosis   Prostate cancer [facility]) Previously managed by urology.  Dr. Penne Patient has presumed stage IV prostate cancer, nodal metastatic disease to the right iliac, common iliac,  external, and internal iliacs, along with a perirectal lesion, elected to defer biopsy.  Patient has been on androgen deprivation with Orgovyx    10/16/2023 Cancer Staging   Staging form: Prostate, AJCC 8th Edition - Clinical stage from 10/16/2023: Stage Unknown (rcTX, rcNX) - Signed by Babara Call, MD on 10/16/2023 Stage prefix: Recurrence   11/06/2023 Imaging   PSMA PET scan showed 1. New intense radiotracer activity in the RIGHT lobe of the prostate gland consistent with local  prostate adenocarcinoma recurrence. 2. New intense radiotracer activity at the level of the RIGHT seminal vesicle consistent with local seminal vesicle invasion. 3. New large retroperitoneal lymph node LEFT of the aorta with mild radiotracer activity. Findings concerning for nodal metastasis. 4. Several new small radiotracer avid SKELETAL METASTASIS. 5. Interval resolution of radiotracer avid RIGHT iliac nodes. 6. Stable irregular mass extending from the lower pole of the RIGHT kidney. Stable marked enlargement LEFT adrenal gland. Findings concerning for renal and adrenal neoplasm. 7. Small bilateral pleural effusions.    Patient has a past medical history of sleep apnea, arthritis, atrial fibrillation on Xarelto , history of CHF, COPD, GERD, interstitial lung disease peripheral vascular disease.  he also has a small right lower pole renal mass and left adrenal mass on surveillance. 04/22/2023, CT abdomen pelvis without contrast showed enlarging left supra renal mass, concerning for relatively indolent adrenal neoplasm.  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  Patient has complained dysuria symptoms.  He takes Azo, previously urinary and urinalysis were both negative.  Accompanied by his daughter. + Back pain.  He takes Tylenol  as needed.  INTERVAL HISTORY Dennis Savage is a 89 y.o. male who has above history reviewed by me today presents for follow up visit for metastatic prostate cancer.  11/25/2023, started on dose reduced Xtandi  120 mg daily.  He tolerated well.  No new complaints.   MEDICAL HISTORY:  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   Apnea, sleep 07/13/2015   Arthralgia of multiple joints 06/15/2011   Arthritis    Artificial lens present 12/08/2010   Atrial fibrillation (HCC)    Benign essential HTN  11/03/2010   CHF (congestive heart failure) (HCC)    Chronic obstructive pulmonary disease (HCC) 11/03/2010   CN (constipation) 06/15/2011   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   Dizziness 02/25/2015   GERD (gastroesophageal reflux disease)    Heart murmur    Hypertension    Interstitial lung disease (HCC) 10/13/2013   LBP (low back pain) 11/03/2010   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   Peripheral vascular disease (HCC) 11/03/2010   Retinal hemorrhage 03/16/2014    SURGICAL HISTORY: Past Surgical History:  Procedure Laterality Date   APPENDECTOMY     BACK SURGERY     CARPAL TUNNEL RELEASE Bilateral    EYE SURGERY Bilateral    Cataract Extraction with IOL   HERNIA REPAIR     Umbilical Hernia X 3   JOINT REPLACEMENT     bilat knees   NASAL SINUS SURGERY     REPLACEMENT TOTAL KNEE Bilateral    SHOULDER SURGERY Right    TONSILLECTOMY     TOTAL HIP ARTHROPLASTY Right 10/12/2015   Procedure: TOTAL HIP ARTHROPLASTY;  Surgeon: Lynwood SHAUNNA Hue, MD;  Location: [facility] ORS;  Service: Orthopedics;  Laterality: Right;   TOTAL HIP ARTHROPLASTY Left 02/15/2023   Procedure: TOTAL HIP ARTHROPLASTY;  Surgeon:  Hooten, Lynwood SQUIBB, MD;  Location: [facility] ORS;  Service: Orthopedics;  Laterality: Left;    SOCIAL HISTORY: Social History   Socioeconomic History   Marital status: Single    Spouse name: Not on file   Number of children: Not on file   Years of education: Not on file   Highest education level: Not on file  Occupational History   Not on file  Tobacco Use   Smoking status: Former    Current packs/day: 1.00    Types: Cigarettes   Smokeless tobacco: Never  Vaping Use   Vaping status: Never Used  Substance and Sexual Activity   Alcohol use: No   Drug use: No   Sexual activity: Not Currently  Other Topics Concern   Not on file  Social History Narrative   Not on file   Social Drivers of Health   Financial Resource Strain: Low Risk  (10/29/2023)   Received from [facility]    Overall Financial Resource Strain (CARDIA)    Difficulty of Paying Living Expenses: Not hard at all  Food Insecurity: No Food Insecurity (10/29/2023)   Received from [facility]   Hunger Vital Sign    Within the past 12 months, you worried that your food would run out before you got the money to buy more.: Never true    Within the past 12 months, the food you bought just didn't last and you didn't have money to get more.: Never true  Transportation Needs: No Transportation Needs (10/29/2023)   Received from [facility] - Transportation    In the past 12 months, has lack of transportation kept you from medical appointments or from getting medications?: No    Lack of Transportation (Non-Medical): No  Physical Activity: Not on file  Stress: Not on file  Social Connections: Moderately Isolated (08/10/2023)   Social Connection and Isolation Panel    Frequency of Communication with Friends and Family: More than three times a week    Frequency of Social Gatherings with Friends and Family: More than three times a week    Attends Religious Services: More than 4 times per year    Active Member of [employer] or Organizations: No    Attends Banker Meetings: Never    Marital Status: Divorced  Catering manager Violence: Not At Risk (10/16/2023)   Humiliation, Afraid, Rape, and Kick questionnaire    Fear of Current or Ex-Partner: No    Emotionally Abused: No    Physically Abused: No    Sexually Abused: No    FAMILY HISTORY: Family History  Problem Relation Age of Onset   Bladder Cancer Neg Hx    Kidney cancer Neg Hx    Prostate cancer Neg Hx     ALLERGIES:  is allergic to lisinopril and penicillins.  MEDICATIONS:  Current Outpatient Medications  Medication Sig Dispense Refill   acetaminophen  (TYLENOL ) 325 MG tablet Take 650 mg by mouth every 6 (six) hours as needed.     albuterol  (VENTOLIN  HFA) 108 (90 Base) MCG/ACT inhaler Inhale 2  puffs into the lungs every 6 (six) hours as needed for wheezing or shortness of breath.     Calcium  Carb-Cholecalciferol  (CALCIUM  600 + D PO) Take 1 tablet by mouth daily.     cholecalciferol  (VITAMIN D3) 25 MCG (1000 UNIT) tablet Take 1,000 Units by mouth daily.     docusate sodium  (COLACE) 100 MG capsule Take 100 mg by mouth 2 (two)  times daily.     edoxaban (SAVAYSA) 30 MG TABS tablet Take 30 mg by mouth daily.     enzalutamide  (XTANDI ) 40 MG tablet Take 3 tablets (120 mg total) by mouth daily. 90 tablet 2   fluticasone  (FLONASE ) 50 MCG/ACT nasal spray Place 1 spray into both nostrils 2 (two) times daily.     furosemide  (LASIX ) 40 MG tablet Take 1 tablet (40 mg total) by mouth daily.     guaiFENesin  (MUCINEX ) 600 MG 12 hr tablet Take 600 mg by mouth 2 (two) times daily.     hydrOXYzine  (ATARAX ) 10 MG tablet Take 10 mg by mouth at bedtime as needed for itching.     Lactulose  20 GM/30ML SOLN Take 30 mLs (20 g total) by mouth daily as needed. 450 mL 0   losartan  (COZAAR ) 25 MG tablet Take 0.5 tablets (12.5 mg total) by mouth daily.     metoprolol  succinate (TOPROL -XL) 25 MG 24 hr tablet Take 0.5 tablets (12.5 mg total) by mouth daily.     Multiple Vitamins-Minerals (PRESERVISION AREDS 2 PO) Take 1 capsule by mouth 2 (two) times daily.     Omega-3 Fatty Acids (FISH OIL) 300 MG CAPS Take 300 mg by mouth daily.     omeprazole (PRILOSEC) 20 MG capsule Take 20 mg by mouth daily.     polyethylene glycol (MIRALAX  / GLYCOLAX ) 17 g packet Take 17 g by mouth daily. 14 each 0   relugolix  (ORGOVYX ) 120 MG tablet Take 1 tablet (120 mg total) by mouth daily. 30 tablet 11   spironolactone  (ALDACTONE ) 25 MG tablet Take 0.5 tablets (12.5 mg total) by mouth daily.     tamsulosin  (FLOMAX ) 0.4 MG CAPS capsule Take 1 capsule (0.4 mg total) by mouth daily.     No current facility-administered medications for this visit.    Review of Systems  Constitutional:  Positive for fatigue. Negative for appetite change,  chills, fever and unexpected weight change.  HENT:   Negative for hearing loss and voice change.   Eyes:  Negative for eye problems and icterus.  Respiratory:  Negative for chest tightness, cough and shortness of breath.   Cardiovascular:  Positive for leg swelling. Negative for chest pain.  Gastrointestinal:  Negative for abdominal distention and abdominal pain.  Endocrine: Negative for hot flashes.  Genitourinary:  Positive for dysuria. Negative for difficulty urinating and frequency.   Musculoskeletal:  Negative for arthralgias.  Skin:  Negative for itching and rash.  Neurological:  Negative for light-headedness and numbness.  Hematological:  Negative for adenopathy. Does not bruise/bleed easily.  Psychiatric/Behavioral:  Negative for confusion.      PHYSICAL EXAMINATION: ECOG PERFORMANCE STATUS: 3 - Symptomatic, >50% confined to bed  Vitals:   12/18/23 1005  BP: 132/60  Pulse: (!) 54  Resp: 16  Temp: 97.6 F (36.4 C)  SpO2: 97%   Filed Weights   12/18/23 1005  Weight: 188 lb 3.2 oz (85.4 kg)    Physical Exam Constitutional:      General: He is not in acute distress.    Comments: Patient states in the wheelchair  HENT:     Head: Normocephalic and atraumatic.  Eyes:     General: No scleral icterus. Cardiovascular:     Rate and Rhythm: Normal rate and regular rhythm.     Heart sounds: No murmur heard. Pulmonary:     Effort: Pulmonary effort is normal. No respiratory distress.     Breath sounds: No wheezing.     Comments:  Decreased breath sound bilaterally Abdominal:     General: Bowel sounds are normal. There is no distension.     Palpations: Abdomen is soft.     Tenderness: There is no abdominal tenderness.  Musculoskeletal:        General: Normal range of motion.     Cervical back: Normal range of motion and neck supple.     Right lower leg: Edema present.     Left lower leg: Edema present.  Skin:    General: Skin is warm and dry.     Findings: No  erythema.  Neurological:     Mental Status: He is alert. Mental status is at baseline.     Cranial Nerves: No cranial nerve deficit.     Motor: No abnormal muscle tone.  Psychiatric:        Mood and Affect: Mood and affect normal.      LABORATORY DATA:  I have reviewed the data as listed    Latest Ref Rng & Units 12/18/2023    9:48 AM 10/16/2023   11:53 AM 08/23/2023    5:28 AM  CBC  WBC 4.0 - 10.5 K/uL 6.5  5.3  6.6   Hemoglobin 13.0 - 17.0 g/dL 87.8  88.2  88.5   Hematocrit 39.0 - 52.0 % 39.1  37.0  36.6   Platelets 150 - 400 K/uL 202  244  380       Latest Ref Rng & Units 12/18/2023    9:49 AM 10/16/2023   11:52 AM 08/23/2023    5:28 AM  CMP  Glucose 70 - 99 mg/dL 880  896  884   BUN 8 - 23 mg/dL 37  36  40   Creatinine 0.61 - 1.24 mg/dL 8.48  8.48  8.57   Sodium 135 - 145 mmol/L 140  138  138   Potassium 3.5 - 5.1 mmol/L 4.4  3.7  4.4   Chloride 98 - 111 mmol/L 108  108  101   CO2 22 - 32 mmol/L 24  23  25    Calcium  8.9 - 10.3 mg/dL 9.4  9.2  9.3   Total Protein 6.5 - 8.1 g/dL 5.9  6.7    Total Bilirubin 0.0 - 1.2 mg/dL 1.2  1.0    Alkaline Phos 38 - 126 U/L 75  67    AST 15 - 41 U/L 22  21    ALT 0 - 44 U/L 10  13       RADIOGRAPHIC STUDIES: I have personally reviewed the radiological images as listed and agreed with the findings in the report. No results found.

## 2023-12-18 NOTE — Assessment & Plan Note (Signed)
 He is not interested in prostate radiation but is open to palliative radiation to bone lesions.  Refer to radiation oncology. He has castration resistant disease.   Discussed about rationale potential side effects of bone stress management.  He agrees.  Will start him on Xgeva in the next visit.

## 2023-12-18 NOTE — Assessment & Plan Note (Addendum)
 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  Patient declines prostate biopsy.  He is also not interested in radiation therapy. Recommend patient to continue androgen deprivation therapy with Orgovyx   He is on dose reduced Xtandi  120 mg daily.- so far he tolerated well.  Continue the current regimen.  Labs reviewed and discussed with patient.

## 2023-12-19 ENCOUNTER — Telehealth: Payer: Self-pay | Admitting: *Deleted

## 2023-12-19 NOTE — Telephone Encounter (Signed)
 The daughter called today and wanted to have PT from [facility]. I spoke to Dr Babara and Almarie, She will put in order for this and Almarie also said maybe get nursing  she thinks.called  and left message that it may have nursing possibility also.

## 2023-12-19 NOTE — Telephone Encounter (Signed)
 Orders for Home health nursing and PT faxed to [facility].   Fax: [phone] Ph: [phone]

## 2023-12-20 ENCOUNTER — Encounter: Payer: Self-pay | Admitting: Oncology

## 2023-12-23 ENCOUNTER — Ambulatory Visit: Admitting: Radiation Oncology

## 2023-12-23 NOTE — H&P (Signed)
 ------------------------------------------------------------------------------- Attestation signed by [place] Velma Hamilton, MD at 12/23/23 1709 It was medically necessary for me to see the patient in addition to the APP. I personally performed the substantive portion of this visit which included 45 minutes spent face-to-face and non-face-to-face in the care of this patient, which includes all pre, intra, and post visit time on the date of service which was specific to E/M visit and does not include any procedures that may have been performed.  Medical decision making: 89 year old male with a past medical history of atrial fibrillation and mild reduced ejection fraction who presents with acute heart failure exacerbation.  Patient with 3 or probably more days of dyspnea on exertion, orthopnea and volume overload.  Has responded well to Lasix  therapy will continue to diurese with net negative goal of 1.5 L.  Will plan on repeating his echocardiogram for reassessment of ejection fraction but would go ahead and treat him for heart failure with mildly reduced ejection fraction/HFpEF.  Will initiate Jardiance  10 mg daily and consider addition of spironolactone  as well.   Velma GORMAN [place], MD  -------------------------------------------------------------------------------   Cardiology History & Physical  Date of Admission:  12/23/2023 Date of Service: 12/23/2023  Reason for Admission:  Dennis Savage is a 89 y.o. male with pertinent PMH of HFmrEF, HTN,  atrial fibrillation, aortic atherosclerosis, obstructive sleep apnea, arthritis, COPD, GERD, ILD, PVD, CKD, retinal hemorrhage, lymphangioendothelioma, prostate cancer, and previous tobacco use who presents with acute on chronic heart failure with mid range ejection fraction.   Principal Problem:   Acute on chronic diastolic (congestive) heart failure    (CMS-HCC) Active Problems:   Atrial fibrillation    (CMS-HCC)   Constipation   Emphysema/COPD    (CMS-HCC)    Hypertension, benign   Peripheral vascular disease   Diastolic heart failure    (CMS-HCC)   CKD (chronic kidney disease)     Assessment and Plan:   Acute on chronic diastolic heart failure  Patient presents with three day history of worsening heart failure symptoms including SOB/DOE and orthopnea.  He is volume up on exam today. BNP 17,916.0. He was started on IV diuresis with IV lasix  40mg  daily.  Most recent echo 08/10/23 with EF 50-55%, moderate concentric LVH, severe biatrial enlargement.  Repeat echo pending.  Update echo Continue IV lasix  40mg  daily Monitor I/Os, daily standing wt Goal net negative 1.5 to 2L per day  Daily BMET Maintain K >= 4.0, Mg >=2.0 Start Jardiance  25mg  daily  Iron studies resulted - message sent to pharmacy.  Prophylaxis: Xarelto  15mg  daily Dispo: PT/OT  Code Status: Full  Chronic Problems  Essential hypertension Continue Amlodipine  10mg  daily   Atrial Fibrillation Not on any rate controlling medication with ventricular rate currently well controlled Continue Xarelto  15mg  daily   Mild aortic dilation, aortic atherosclerosis, PVD Will get a lipid panel  - ordered for tomorrow AM. He is not currently on a statin.  Anemia Iron studies just resulted - message sent to pharmacy   Obstructive sleep apnea CPAP ordered for bed  COPD PTA inhalers  PRN Bruni oxygen  GERD Protonix  20mg  daily   IBS/Constipation PTA Linzess  PRN Colace PRN lactulose  PRN Miralax   [facility] has indicated that we do not have Relugolix  or Xtandi  on formulary; therefore, we will be using his home medication (brought in) during admission to ensure that he does not have any interruption in therapy.  Continue home Orgovyx  / Relugolix   Continue home Xtandi    CKD Daily BMET  Jacquelyn D Visser, PA 12/23/2023 4:11 PM   ---------------------------------------------------------------------------------------------------------------------  Chief  Concern:   Chief Complaint  Patient presents with  . Shortness of Breath    History of Present Illness:  Dennis Savage is a 89 y.o. male with PMH of HFmrEF, HTN,  atrial fibrillation, aortic atherosclerosis, obstructive sleep apnea, arthritis, COPD, GERD, ILD, PVD, retinal hemorrhage, lymphangioendothelioma, prostate cancer, and previous tobacco use who presents with acute on chronic heart failure with mid range ejection fraction.   The patient reports for the past 3 days he has had progressive symptoms of volume overload.  Specifically, he mentions 4 hours of shortness of breath on Saturday this past weekend.  He also experienced another 4 hours of shortness of breath on Sunday.  He states he then went to the restroom on Sunday night.  When he came back from the restroom, he was unable to lay flat, describing orthopnea.  He also describes early satiety over the last few day.  He states that he has been having some chest pain/pressure that is mainly positional.  The chest pain/pressure is central and extends into his shoulders.  It does not radiate up his neck or into his arms.  It is worse when lying flat.  It resolves with sitting up.  He states that he is taking his medications as administered by his daughter, who is in charge of his medications.  On review of the emergency department note 9/8 at 8:52 AM, the patient's daughter notes that over the past 5 months, the patient has been taking Lasix  infrequently.  The medication has been administered mostly every 2 days.  She has decreased the amount of medication due to the patient having subjectively less fluid, as well as the Lasix  appearing to give him diarrhea each time he takes the medication.   Primary cardiologist:  Dr. Sabina Custovic, DO at [facility]  PCP:  Rudolpho Norleen BIRCH, MD  Medical History: Past Medical History[1]  Past Surgical History[2]  Prior to Admission medications  Medication Dose, Route, Frequency   amLODIPine  (NORVASC ) 10 MG tablet 10 mg, Oral, Daily (standard)  fluticasone -umeclidin-vilanter (TRELEGY ELLIPTA) 100-62.5-25 mcg inhaler 1 puff, Inhalation, Daily (standard)  furosemide  (LASIX ) 20 MG tablet 20 mg, Oral, Daily (standard)  gabapentin  (NEURONTIN ) 100 MG capsule 100 mg, Oral, Nightly  [Paused] hydrOXYzine  (ATARAX ) 10 MG tablet 10 mg, Oral, At bedtime as needed Paused since Thu 07/18/2023 until manually resumed  lactulose  10 gram/15 mL solution 20 g, Oral, Daily PRN  linaclotide  (LINZESS ) 145 mcg capsule 145 mcg, Daily before breakfast  multivitamin,tx-iron-minerals (COMPLETE MULTIVITAMIN) Tab Take by mouth. Frequency:QD   Dosage:1   TABLET  Instructions:  Note:Dose: 1 TABLET  omeprazole (PRILOSEC) 20 MG capsule 20 mg, Oral, Daily (standard)  ORGOVYX  120 mg tablet 120 mg, Oral, Daily (standard)  rivaroxaban  (XARELTO ) 15 mg Tab Discontinue warfarin. Take 1 tablet once daily with evening meal.  empagliflozin  (JARDIANCE ) 10 mg tablet 10 mg, Oral, Daily (standard), For benefit inquiry only. Please send Epic chat message to Joesph Arias with coverage information, then discontinue Rx.    ALLERGIES:  Penicillins  Family History:  family history includes Diabetes in his mother; Hypertension in his father and mother.  Social History:   Lives with his daughter and her son He  reports that he has quit smoking. He has never used smokeless tobacco. He reports that he does not drink alcohol and does not use drugs.  Review of Systems:  Rest of the review of systems is negative  or unremarkable except as stated above.  Designated Environmental health practitioner: Mr. Morella currently has decisional capacity for healthcare decision-making and is able to designate a surrogate healthcare decision maker. Mr. Norden designated healthcare decision maker(s) is/are Boda-Smith,[name] (the patient's adult child) as denoted by stated patient preference.   Physical Exam BP 181/83   Pulse 112   Temp 36.5 C  (97.7 F) (Oral)   Resp 21   Ht 176.5 cm (5' 9.5)   Wt 85.4 kg (188 lb 4.4 oz)   SpO2 97%   BMI 27.40 kg/m  Wt Readings from Last 3 Encounters:  12/23/23 85.4 kg (188 lb 4.4 oz)  07/12/23 92 kg (202 lb 13.2 oz)  06/07/23 87.1 kg (192 lb 1.6 oz)       Body mass index is 27.4 kg/m.   General:  Alert, cooperative, no distress  HEENT: Atraumatic, PERRL, EOM intact, moist mucous membranes  Neck: No carotid bruits. marked JVD. JVP at the level of the mandible.  Heart: Irregular rate and rhythm. normal S1/S2, 1/6 systolic murmur at the apex, 1/4 diastolic murmur RUSB, no rub or gallop.  Lungs:   Reduced breath sounds bilaterally with Bibasilar crackles appreciated   Abdomen:   Soft & non-tender, active bowel sounds  Extremities: 1-2+ bilateral lower extremity edema extending up the tibia to the level of the knee, no cyanosis. Decreased at 1+ and symmetric pedal pulses. Cool bilateral lower extremities with mottling and discoloration suggestive of known PVD  Skin: Normal skin color, texture and turgor, no rashes or lesions.  Neurologic: Alert, oriented to person, place, and time     Labs & Imaging:  Reviewed in Epic, and notable for hemoglobin 12.8, hematocrit 38.1, sodium 145, potassium 4.8, creatinine 1.27, BUN 33, high-sensitivity troponin 54 --53--59, proBNP 17,916.0, TSH 2.451.   EKG: Atrial fibrillation with controlled ventricular rate at 62bpm, occasional PVCs, RBBB, nonspecific ST/T changes, no acute changes from previous Chest x-ray:  12/23/23 Lungs hypoinflated. Mild pulmonary edema and small bilateral pleural effusions with bilateral medial basilar atelectasis. No pneumothorax.    Enlarged cardiomediastinal silhouette.   Most recent pertinent Cardiac Studies: Echocardiogram  08/10/23  1. Left ventricular ejection fraction, by estimation, is 50 to 55%. The left ventricle has low normal function. The left ventricle has no regional wall motion abnormalities. There is moderate  concentric left ventricular hypertrophy. Left ventricular  diastolic parameters are indeterminate.   2. Right ventricular systolic function is normal. The right ventricular size is normal. There is normal pulmonary artery systolic pressure.   3. Left atrial size was severely dilated.   4. Right atrial size was severely dilated.   5. The mitral valve is normal in structure. Mild mitral valve regurgitation.   6. The aortic valve is tricuspid. Aortic valve regurgitation is mild. Aortic valve sclerosis/calcification is present, without any evidence of aortic stenosis.   7. Aortic dilatation noted. There is mild dilatation of the aortic root, measuring 40 mm.   8. The inferior vena cava is normal in size with greater than 50% respiratory variability, suggesting right atrial pressure of 3 mmHg.         [1] Past Medical History: Diagnosis Date  . Atrial fibrillation    (CMS-HCC)   . CHF (congestive heart failure)    (CMS-HCC)   . COPD (chronic obstructive pulmonary disease)    (CMS-HCC)   . GERD (gastroesophageal reflux disease)   . Hypertension   [2] Past Surgical History: Procedure Laterality Date  . APPENDECTOMY    .  BACK SURGERY N/A   . EYE SURGERY    . HERNIA REPAIR    . SHOULDER SURGERY N/A   . TONSILLECTOMY N/A   . TOTAL HIP ARTHROPLASTY Left 02/15/2023  . TOTAL HIP ARTHROPLASTY Right 10/12/2015  . TOTAL KNEE ARTHROPLASTY Right 02/08/2010  . TOTAL KNEE ARTHROPLASTY Left

## 2023-12-24 NOTE — Telephone Encounter (Signed)
 The previous fax number is [facility]. Orders have been faxed to [facility] @ [phone]. Fax confirmation received.

## 2023-12-25 NOTE — Progress Notes (Signed)
 ------------------------------------------------------------------------------- Attestation signed by [place] Velma Hamilton, MD at 12/25/23 1605 It was medically necessary for me to see the patient in addition to the APP. I personally performed the substantive portion of this visit which included 30 minutes spent face-to-face and non-face-to-face in the care of this patient, which includes all pre, intra, and post visit time on the date of service which was specific to E/M visit and does not include any procedures that may have been performed.  Medical decision making: 89 year old male admitted with heart failure with mild reduced ejection fraction who had been observed to have possible wall motion abnormality on echocardiogram.  Patient with left heart catheterization today without significant coronary artery disease.  Patient has continued diuresed well and her symptoms are improving.  Recommend initial day of diuresis with continuation of Jardiance  and spironolactone .  Patient should complete his IV iron dose.  Anticipate transitioning to oral diuretic tomorrow and as long as his white count trends downward anticipate discharge home on 12/26/2023.   Velma GORMAN [place], MD  -------------------------------------------------------------------------------   Cardiology Progress Note  Patient Name: Dennis Savage MRN: [id number] Date of Admission: 11/22/23 Date of Service: 12/25/2023  Reason for Admission:  Dennis Savage is a 89 y.o. male admitted for HFmrEF with newest echo showing HFrEF (EF 40-45%), HTN,  atrial fibrillation, aortic atherosclerosis, obstructive sleep apnea, arthritis, COPD, GERD, ILD, PVD, CKD, retinal hemorrhage, lymphangioendothelioma, prostate cancer, and previous tobacco use who presents with acute on chronic heart failure with newly reduced ejection fraction by most recent echo.     Assessment and Plan:   Exacerbation of heart failure with newly reduced ejection fraction (EF now 40-45%,  12/23/23) Patient presents with three day history of worsening heart failure symptoms including SOB/DOE and orthopnea.   BNP 17,916.0 at admission with pt started on IV lasix  40mg  daily with net -4.6L for the admission and volume status significantly improved on exam today. Most recent echo shows LVEF 40 to 45%, down from previous EF 50-55% and with new RWMA. LHC subsequently performed 12/25/23 with mild to moderate diffuse CAD and 25-50%s of mLAD. No intervention. He will continue to IV diurese, and we will ensure medical management / RF modification. Continue IV lasix  40mg  daily at least one more day  Suspect can transition him over to oral diuresis with po lasix  tomorrow 12/26/23 Monitor I/Os, daily weights  Net -1.2L yesterday (9/9-9/10) Wt 85.4kg (9/8) --> 82.0kg (9/10) Daily BMET; Maintain K >= 4.0, Mg >=2.0 Cr 1.27 (9/8) --> 1.28 (9/10). K 4.5.  Continue Jardiance  10mg  daily  Continue Spironolactone  25mg  daily  Continue IV iron (one dose remaining, then we will recheck his panel)   Leukocytosis WBC 6.8 --> 12.6. Will continue to monitor with recheck CBC with diff tomorrow AM. Pt denies any concerning symptoms and not febrile on vital checks. CBC with diff scheduled for tomorrow AM   Prophylaxis: Xarelto  15mg  daily (holding at this time - message sent to pharmacy to determine if acceptable to restart with Xtandi . Pt's daughter reports oncologist recommended Savaysa; however, they are unable to afford this medication, so they cannot fill it. If we are unable to restart Xarelto , we will speak with the pt regarding the Watchman).  Dispo: Has [facility]. Redell (husband of daughter) can help to transport him home given EDD of tomorrow 9/11 Code Status: Full   Chronic Problems   Essential hypertension BP currently well controlled.  Continue Amlodipine  10mg  daily, Jardiance  10mg  daily, Spironolactone  25mg  daily   Atrial Fibrillation with controlled  ventricular rate Long standing history of  Afib. Ventricular rate well controlled. Had bradycardia overnight in the setting of known OSA.  Not on any rate controlling medication with ventricular rate currently well controlled Holding Xarelto  15mg  daily - message sent to pharmacy to determine if acceptable to restart with Xtandi . Pt's daughter reports oncologist recommended Savaysa; however, they are unable to afford this medication, so they cannot fill it. If we are unable to restart Xarelto , we will speak with the pt regarding the Watchman.    Mild aortic dilation, aortic atherosclerosis, PVD Lipid panel resulted - LDL is 58  Continue atorvastatin 10mg  daily (can discharge on Crestor 5mg  daily - it is not on formulary for admission)   Anemia Continue IV iron with Ferrlecit 125mg  q12h for four doses. One dose remaining. Recheck iron panel and ferritin after these four doses. Will schedule recheck Fe panel for tomorrow AM.   Anxiety Changed hydroxyzine  from PRN to daily given reported anxiety  Obstructive sleep apnea Discontinued CPAP- CPAP was ordered for bedtime 9/8. Pt refused (see 9/8 documentation in chart); therefore, this order was discontinued. We did discuss treatment of OSA today.   COPD PTA inhalers  PRN [place] oxygen   Cough Suspect cough with clear phlegm as 2/2 his volume overload as discussed with pt PRN Robitussin   GERD Protonix  20mg  daily    IBS/Constipation PTA Linzess  PRN Colace PRN lactulose  PRN Miralax    [facility] has indicated that we do not have Relugolix  or Xtandi  on formulary; therefore, we will be using his home medication (brought in) during admission to ensure that he does not have any interruption in therapy. We are asking pharmacy about the possible interaction between Xarelto  and Xtandi  Continue home Orgovyx  / Relugolix   Continue home Xtandi     CKD Daily BMET  Unsteadiness on feet / Abnormal gait  Risk of deconditioning during admission Pt uses a walker at home.  PT has  evaluated with recommendation for PT 1-2x per day and 3-4 days per week. No DME recommendations.  OT to evaluate.   Appreciate both PT and OT recommendations.  Pt has [facility] at home to assist    Ritta JONETTA Ports, PA  ---------------------------------------------------------------------------------------------------------------------    Interval History/Subjective:   Patient feels well today following his LHC.  Breathing improved. No chest pain.  He is joined by his daughter. We discussed Xarelto  (see above) and EDD.   Objective:   Medications: Scheduled Medications[1] Infusions Meds[2] PRN Medications[3]  Physical Examination: Temp:  [36 C (96.8 F)-36.7 C (98.1 F)] 36.7 C (98.1 F) Pulse:  [37-76] 63 SpO2 Pulse:  [50-71] 50 Resp:  [16-26] 24 BP: (115-165)/(66-83) 120/68 MAP (mmHg):  [81-102] 102 SpO2:  [92 %-96 %] 95 %  Date/Time Resp SpO2 O2 Device FiO2 (%) O2 Flow Rate (L/min)   12/25/23 0830 24  95 %  None (Room air)  -- --   12/25/23 0815 --  --  None (Room air)  -- --   12/25/23 0800 16  93 %  None (Room air)  -- --   12/25/23 0745 26  96 %  None (Room air)  -- --   12/25/23 0744 19  95 %  --  -- --   12/25/23 0558 18  93 %  None (Room air)  -- --   Height: 176.5 cm (5' 9.5) Body mass index is 26.3 kg/m. Wt Readings from Last 3 Encounters:  12/25/23 82 kg (180 lb 11.2 oz)  07/12/23 92 kg (202  lb 13.2 oz)  06/07/23 87.1 kg (192 lb 1.6 oz)      Intake/Output Summary (Last 24 hours) at 12/25/2023 0851 Last data filed at 12/25/2023 0557 Gross per 24 hour  Intake --  Output 1200 ml  Net -1200 ml   I/O last 3 completed shifts: In: 200 [P.O.:200] Out: 3100 [Urine:3100] I/O       09/08 0701 09/09 0700 09/09 0701 09/10 0700 09/10 0701 09/11 0700   P.O. 320     Total Intake 320     Urine (mL/kg/hr) 3700 1200 (0.6)    Stool 0     Total Output(mL/kg) 3700 (44.8) 1200 (14.6)    Net -3380 -1200          Stool Occurrence 1 x           No results for input(s): O2SATVEN in the last 24 hours.   Physical Exam: VITAL SIGNS: Temp:  [36 C (96.8 F)-36.7 C (98.1 F)] 36.7 C (98.1 F) Pulse:  [37-76] 63 SpO2 Pulse:  [50-71] 50 Resp:  [16-26] 24 BP: (115-165)/(66-83) 120/68 SpO2:  [92 %-96 %] 95 % General:  Alert, cooperative, no distress  HEENT: Atraumatic, PERRL, EOM intact, moist mucous membranes  Neck: No carotid bruits. JVP ~8-9cm  Heart: IRIR with controlled ventricular rate. normal S1/S2, 1/6 systolic murmur at apex and 1/4 diastolic murmur RUSB, no rub or gallop. TR band in place on the R wrist /Right radial arteriotomy site   Lungs:   Improved breath sounds bilaterally -slight reduced breath sounds at the bases  Abdomen:   Soft & non-tender, active bowel sounds  Extremities: No lower extremity edema, no cyanosis. 2+ and symmetric pedal pulses.  Skin: Normal skin color, texture and turgor, no rashes or lesions.  Neurologic: Alert, oriented to person, place, and time    Echo  12/23/23 Summary   1. The left ventricle is mildly dilated in size with mildly increased wall thickness.   2. The left ventricular systolic function is mildly to moderately decreased, LVEF is visually estimated at 40-45%.   3. There is hypokinesis of the mid-basal anteroseptum and mid-basal inferoseptum.   4. The right ventricle is normal in size, with normal systolic function.   5. The right atrium is moderately dilated in size.   6. The left atrium is severely dilated in size.   7. The aortic valve is trileaflet with moderately thickened leaflets with moderately reduced excursion.    Labs & Imaging: Reviewed in EPIC.  Lab Results  Component Value Date   WBC 12.6 (H) 12/25/2023   HGB 13.3 12/25/2023   HCT 40.8 12/25/2023   PLT 130 (L) 12/25/2023   Lab Results  Component Value Date   NA 146 (H) 12/25/2023   K 4.5 12/25/2023   CL 106 12/25/2023   CO2 19.9 (L) 12/25/2023   BUN 22 12/25/2023   CREATININE 1.28 (H) 12/25/2023    GLU 105 12/25/2023   CALCIUM  9.9 12/25/2023   MG 2.1 12/25/2023   Lab Results  Component Value Date   BILITOT 1.1 12/23/2023   BILIDIR 0.50 (H) 07/08/2023   PROT 6.7 12/23/2023   ALBUMIN 3.6 12/23/2023   ALT <7 (L) 12/23/2023   AST 21 12/23/2023   ALKPHOS 86 12/23/2023   Lab Results  Component Value Date   INR 1.77 07/09/2023   APTT 35.6 07/09/2023   Lab Results  Component Value Date   PRO-BNP 17,916.0 (H) 12/23/2023   PRO-BNP 775 10/06/2012   LDH 224 07/09/2023           [  1] . Una Hold] amlodipine   10 mg Oral Daily  . [Transfer Hold] atorvastatin  10 mg Oral Daily  . [Transfer Hold] empagliflozin   10 mg Oral Daily  . [Transfer Hold] fluticasone  furoate-vilanterol  1 puff Inhalation Daily (RT)   And  . [Transfer Hold] umeclidinium  1 puff Inhalation Daily (RT)  . [Transfer Hold] furosemide   40 mg Intravenous Daily  . [Transfer Hold] gabapentin   100 mg Oral Nightly  . [Transfer Hold] hydrOXYzine   10 mg Oral Daily with lunch  . [Transfer Hold] linaclotide   145 mcg Oral Daily before breakfast  . [Transfer Hold] multivitamins (ADULT)  1 tablet Oral Daily  . [Transfer Hold] pantoprazole   20 mg Oral Daily before breakfast  . [Transfer Hold] relugolix   120 mg Oral Daily  . [Provider Hold] rivaroxaban   15 mg Oral Daily  . [Transfer Hold] sodium ferric gluconate IVPB  125 mg Intravenous Q12H SCH  . [Transfer Hold] spironolactone   25 mg Oral Daily  [2] [3] [Transfer Hold] acetaminophen , [Transfer Hold] docusate sodium , [Transfer Hold] guaiFENesin , [Transfer Hold] lactulose , [Transfer Hold] ondansetron , [Transfer Hold] polyethylene glycol

## 2023-12-26 ENCOUNTER — Telehealth: Payer: Self-pay | Admitting: *Deleted

## 2023-12-26 ENCOUNTER — Other Ambulatory Visit: Payer: Self-pay

## 2023-12-26 DIAGNOSIS — C61 Malignant neoplasm of prostate: Secondary | ICD-10-CM

## 2023-12-26 NOTE — Telephone Encounter (Signed)
 Attempted call to patient's daughter, left message for her to return my call.

## 2023-12-26 NOTE — Telephone Encounter (Signed)
 Daughter has questions regarding the side effects of Xtandi .

## 2023-12-26 NOTE — Telephone Encounter (Signed)
 Alyson, would you be able to contact pt's daughter.

## 2023-12-27 ENCOUNTER — Other Ambulatory Visit

## 2023-12-27 ENCOUNTER — Ambulatory Visit: Payer: Self-pay | Admitting: Urology

## 2023-12-30 ENCOUNTER — Ambulatory Visit: Admitting: Urology

## 2023-12-30 ENCOUNTER — Ambulatory Visit: Admitting: Radiation Oncology

## 2024-01-01 ENCOUNTER — Other Ambulatory Visit

## 2024-01-01 ENCOUNTER — Ambulatory Visit: Admitting: Urology

## 2024-01-01 DIAGNOSIS — C61 Malignant neoplasm of prostate: Secondary | ICD-10-CM

## 2024-01-02 LAB — PSA: Prostate Specific Ag, Serum: 4.7 ng/mL — ABNORMAL HIGH (ref 0.0–4.0)

## 2024-01-02 NOTE — Progress Notes (Signed)
 01/03/2024 10:49 AM   Dennis Savage 07/29/1929 [id number]  Referring provider: Rudolpho Norleen BIRCH, MD [address] [facility] [address] [place],  [place] [address]  Urological history: 1. Prostate cancer - PSA (12/2023) 4.7  - castrate resistant metastatic prostate cancer  - PET scan (10/2023) New intense radiotracer activity in the RIGHT lobe of the prostate gland consistent with local prostate adenocarcinoma recurrence.  New intense radiotracer activity at the level of the RIGHT seminal vesicle consistent with local seminal vesicle invasion.  New large retroperitoneal lymph node LEFT of the aorta with mild radiotracer activity. Findings concerning for nodal metastasis.  Several new small radiotracer avid SKELETAL METASTASIS. Interval resolution of radiotracer avid RIGHT iliac nodes.  Stable irregular mass extending from the lower pole of the RIGHT kidney. Stable marked enlargement LEFT adrenal gland. Findings concerning for renal and adrenal neoplasm.  Small bilateral pleural effusions. - palliative radiation  - Orgovyx  and Xtandi  120 mg daily and Xgeva   2. Adrenal mass - PET scan (10/2023) -  Stable marked enlargement LEFT adrenal gland. Findings concerning for adrenal neoplasm.  3. Renal mass - PET scan (10/2023) -  Stable irregular mass extending from the lower pole of the RIGHT kidney  4. Phimosis  Chief Complaint  Patient presents with   Prostate Cancer   Follow-up   HPI: Dennis Savage is a 89 y.o. man who presents today for one year follow up with his daughter, [name].    Previous records reviewed.  Recently admitted for exacerbation of heart failure.    He is having issues with nocturia x 4, a weak urinary stream, he has to sit to void, postvoid dribbling, urgency and frequency.  He states he can sit for about a couple hours and watch TV without having to use the restroom, but when he stands he immediately has to find the bathroom. Patient denies any modifying or  aggravating factors.  Patient denies any recent UTI's, gross hematuria, dysuria or suprapubic/flank pain.  Patient denies any fevers, chills, nausea or vomiting.    He is taking tamsulosin  0.4 mg daily.  PSA is 4.7, down from  55.7 three months ago.    UA (09/2023) Bland  Serum creatinine (12/2023) 1.62, eGFR 40   PVR 12 mL   PMH: 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   Apnea, sleep 07/13/2015   Arthralgia of multiple joints 06/15/2011   Arthritis    Artificial lens present 12/08/2010   Atrial fibrillation (HCC)    Benign essential HTN 11/03/2010   CHF (congestive heart failure) (HCC)    Chronic obstructive pulmonary disease (HCC) 11/03/2010   CN (constipation) 06/15/2011   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   Dizziness 02/25/2015   GERD (gastroesophageal reflux disease)    Heart murmur    Hypertension    Interstitial lung disease (HCC) 10/13/2013   LBP (low back pain) 11/03/2010   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   Peripheral vascular disease (HCC) 11/03/2010   Retinal hemorrhage 03/16/2014    Surgical History: Past Surgical History:  Procedure Laterality Date   APPENDECTOMY     BACK SURGERY     CARPAL TUNNEL RELEASE Bilateral    EYE SURGERY Bilateral    Cataract Extraction with IOL   HERNIA REPAIR     Umbilical Hernia X 3   JOINT REPLACEMENT     bilat knees   NASAL SINUS SURGERY  REPLACEMENT TOTAL KNEE Bilateral    SHOULDER SURGERY Right    TONSILLECTOMY     TOTAL HIP ARTHROPLASTY Right 10/12/2015   Procedure: TOTAL HIP ARTHROPLASTY;  Surgeon: Lynwood SHAUNNA Hue, MD;  Location: [facility] ORS;  Service: Orthopedics;  Laterality: Right;   TOTAL HIP ARTHROPLASTY Left 02/15/2023   Procedure: TOTAL HIP ARTHROPLASTY;  Surgeon: Hue Lynwood SHAUNNA, MD;  Location: [facility] ORS;  Service: Orthopedics;  Laterality: Left;    Home Medications:  Allergies as of 01/03/2024        Reactions   Lisinopril Cough   Penicillins Hives, Swelling, Dermatitis   IgE = 17 ((WNL) on 02/08/2023        Medication List        Accurate as of January 03, 2024 11:59 PM. If you have any questions, ask your nurse or doctor.          STOP taking these medications    edoxaban 30 MG Tabs tablet Commonly known as: SAVAYSA       TAKE these medications    acetaminophen  325 MG tablet Commonly known as: TYLENOL  Take 650 mg by mouth every 6 (six) hours as needed.   albuterol  108 (90 Base) MCG/ACT inhaler Commonly known as: VENTOLIN  HFA Inhale 2 puffs into the lungs every 6 (six) hours as needed for wheezing or shortness of breath.   amLODipine  10 MG tablet Commonly known as: NORVASC  Take 10 mg by mouth.   apixaban 2.5 MG Tabs tablet Commonly known as: ELIQUIS Take 2.5 mg by mouth.   CALCIUM  600 + D PO Take 1 tablet by mouth daily.   cholecalciferol  25 MCG (1000 UNIT) tablet Commonly known as: VITAMIN D3 Take 1,000 Units by mouth daily.   docusate sodium  100 MG capsule Commonly known as: COLACE Take 100 mg by mouth 2 (two) times daily.   Fish Oil 300 MG Caps Take 300 mg by mouth daily.   fluticasone  50 MCG/ACT nasal spray Commonly known as: FLONASE  Place 1 spray into both nostrils 2 (two) times daily.   furosemide  40 MG tablet Commonly known as: LASIX  Take 1 tablet (40 mg total) by mouth daily.   Gemtesa  75 MG Tabs Generic drug: Vibegron  Take one tablet every other day   guaiFENesin  600 MG 12 hr tablet Commonly known as: MUCINEX  Take 600 mg by mouth 2 (two) times daily.   hydrOXYzine  10 MG tablet Commonly known as: ATARAX  Take 10 mg by mouth at bedtime as needed for itching.   Lactulose  20 GM/30ML Soln Take 30 mLs (20 g total) by mouth daily as needed.   losartan  25 MG tablet Commonly known as: COZAAR  Take 0.5 tablets (12.5 mg total) by mouth daily.   metoprolol  succinate 25 MG 24 hr tablet Commonly known as: TOPROL -XL Take 0.5  tablets (12.5 mg total) by mouth daily.   omeprazole 20 MG capsule Commonly known as: PRILOSEC Take 20 mg by mouth daily.   Orgovyx  120 MG tablet Generic drug: relugolix  Take 1 tablet (120 mg total) by mouth daily.   polyethylene glycol 17 g packet Commonly known as: MIRALAX  / GLYCOLAX  Take 17 g by mouth daily.   PRESERVISION AREDS 2 PO Take 1 capsule by mouth 2 (two) times daily.   spironolactone  25 MG tablet Commonly known as: ALDACTONE  Take 0.5 tablets (12.5 mg total) by mouth daily.   tamsulosin  0.4 MG Caps capsule Commonly known as: FLOMAX  Take 1 capsule (0.4 mg total) by mouth daily.   Xtandi  40 MG tablet Generic drug: enzalutamide  Take  3 tablets (120 mg total) by mouth daily.        Allergies:  Allergies  Allergen Reactions   Lisinopril Cough   Penicillins Hives, Swelling and Dermatitis    IgE = 17 ((WNL) on 02/08/2023    Family History: Family History  Problem Relation Age of Onset   Bladder Cancer Neg Hx    Kidney cancer Neg Hx    Prostate cancer Neg Hx     Social History:  reports that he has quit smoking. His smoking use included cigarettes. He has never used smokeless tobacco. He reports that he does not drink alcohol and does not use drugs.  ROS: Pertinent ROS in HPI  Physical Exam: BP (!) 132/57 (BP Location: Left Arm, Patient Position: Sitting, Cuff Size: Normal)   Pulse 76   Wt 181 lb 1.6 oz (82.1 kg)   SpO2 96%   BMI 26.74 kg/m   Constitutional:  Well nourished. Alert and oriented, No acute distress. HEENT: [place] AT, moist mucus membranes.  Trachea midline Cardiovascular: No clubbing, cyanosis, or edema. Respiratory: Normal respiratory effort, no increased work of breathing. Neurologic: Grossly intact, no focal deficits, moving all 4 extremities. Psychiatric: Normal mood and affect.  Laboratory Data: Lab Results  Component Value Date   WBC 6.5 12/18/2023   HGB 12.1 (L) 12/18/2023   HCT 39.1 12/18/2023   MCV 90.7 12/18/2023    PLT 202 12/18/2023    Lab Results  Component Value Date   CREATININE 1.51 (H) 12/18/2023    Lab Results  Component Value Date   TESTOSTERONE  8 (L) 10/16/2023  I have reviewed the labs.   Pertinent Imaging:  01/03/24 09:34  Scan Result 12mL    Assessment & Plan:    1. Castrate resistant metastatic prostate cancer - PSA reduced to 4.7 from 55.7 - followed by the cancer center as well  - Continue Orgovyx , Xtandi  and Xgeva - follow up in 6 months for PSA and PVR - Encouraged to continue follow-up with the cancer center  2. Adrenal mass - no surgical intervention planned due to high surgical risk  3. Renal mass - no further intervention or imaging for this to be pursued as it will not likely impact his morbidity and mortality   4. BPH with LU TS - worsening severe symptoms  - UA benign  - PVR < 300 cc  - most bothersome symptoms are nocturia x 4 - encouraged avoiding bladder irritants, fluid restriction before bedtime and timed voiding's - Continue tamsulosin  0.4 mg daily  5. Nocturia - I explained to the patient that nocturia is often multi-factorial and difficult to treat.  Sleeping disorders, heart conditions, peripheral vascular disease, diabetes, an enlarged prostate for men, an urethral stricture causing bladder outlet obstruction and/or certain medications can contribute to nocturia. - I have suggested that the patient avoid caffeine after noon and alcohol in the evening.  He or she may also benefit from fluid restrictions after 6:00 in the evening and voiding just prior to bedtime. - start Gemtesa  75 mg every other day as I am concerned that some of his symptoms appear to be obstructive in nature as well and I do not want him to go into urinary retention, I educated the patient and his daughter of signs of impending retention and to discontinue the Gemtesa  immediately if he should experience any of the symptoms and to contact the office - He will also continue the  tamsulosin  0.4 mg daily - They will contact me via MyChart  regarding the effectiveness of the Gemtesa  and addressing his urinary symptoms  No follow-ups on file.  These notes generated with voice recognition software. I apologize for typographical errors.  CLOTILDA HELON RIGGERS  [facility] [address]  [address] [place], [place] [address] [phone]

## 2024-01-03 ENCOUNTER — Ambulatory Visit (INDEPENDENT_AMBULATORY_CARE_PROVIDER_SITE_OTHER): Admitting: Urology

## 2024-01-03 VITALS — BP 132/57 | HR 76 | Wt 181.1 lb

## 2024-01-03 DIAGNOSIS — N401 Enlarged prostate with lower urinary tract symptoms: Secondary | ICD-10-CM

## 2024-01-03 DIAGNOSIS — E278 Other specified disorders of adrenal gland: Secondary | ICD-10-CM | POA: Diagnosis not present

## 2024-01-03 DIAGNOSIS — R351 Nocturia: Secondary | ICD-10-CM | POA: Diagnosis not present

## 2024-01-03 DIAGNOSIS — N3281 Overactive bladder: Secondary | ICD-10-CM

## 2024-01-03 DIAGNOSIS — N138 Other obstructive and reflux uropathy: Secondary | ICD-10-CM

## 2024-01-03 DIAGNOSIS — C61 Malignant neoplasm of prostate: Secondary | ICD-10-CM

## 2024-01-03 DIAGNOSIS — N2889 Other specified disorders of kidney and ureter: Secondary | ICD-10-CM | POA: Diagnosis not present

## 2024-01-03 LAB — BLADDER SCAN AMB NON-IMAGING

## 2024-01-03 MED ORDER — GEMTESA 75 MG PO TABS: ORAL_TABLET | ORAL | Status: AC

## 2024-01-03 NOTE — Patient Instructions (Signed)
 Take the Gemtesa  75 mg tablet every other day.  If you start to notice it is more difficult to urinate, stop the medication immediately and notify us .  Otherwise continue the medication and after being on the medication for 2 weeks you should notice a difference.

## 2024-01-05 ENCOUNTER — Encounter: Payer: Self-pay | Admitting: Urology

## 2024-01-08 ENCOUNTER — Ambulatory Visit
Admission: RE | Admit: 2024-01-08 | Discharge: 2024-01-08 | Disposition: A | Discharge status: Home / Self Care | Source: Ambulatory Visit | Attending: Radiation Oncology | Admitting: Radiation Oncology

## 2024-01-08 ENCOUNTER — Encounter: Payer: Self-pay | Admitting: Radiation Oncology

## 2024-01-08 ENCOUNTER — Other Ambulatory Visit: Payer: Self-pay | Admitting: *Deleted

## 2024-01-08 VITALS — BP 97/55 | HR 53 | Temp 97.5°F | Resp 16

## 2024-01-08 DIAGNOSIS — C61 Malignant neoplasm of prostate: Secondary | ICD-10-CM

## 2024-01-08 NOTE — Consult Note (Signed)
 NEW PATIENT EVALUATION  Name: Dennis Savage  MRN: [id number]  Date:   01/08/2024     DOB: Sep 14, 1929   This 89 y.o. male patient presents to the clinic for initial evaluation of palliative radiation therapy to spine and patient with castrate resistant metastatic prostate cancer with decree strength in his lower extremities as well as subtle back pain.  REFERRING PHYSICIAN: Babara Call, MD  CHIEF COMPLAINT:  Chief Complaint  Patient presents with   Prostate Cancer    DIAGNOSIS: The encounter diagnosis was Prostate cancer [facility]).   PREVIOUS INVESTIGATIONS:  [facility] PET scan reviewed bone scan ordered Clinical notes reviewed Pathology reviewed  HPI: Patient is a 89 year old male with stage IV castrate resistant prostate cancer.  He had a PET CT scan [facility] back in July showing an radiotracer activity in the right lobe of the prostate and right seminal vesicle.  He also a large retroperitoneal lymph node left of the aorta with mild radiotracer activity concerning for nodal metastasis.  There are also skeletal metastasis and several in his spine with close proximity to the spinal canal.  He is currently being treated withOrgovyx and Xtandi  120 mg daily and Xgeva.  He is seen today for consideration of palliative radiation therapy.  He is having difficulty ambulating with decree strength in his bilateral lower extremities.  PLANNED TREATMENT REGIMEN: Bone scan followed by possible palliative radiation therapy to his spine  PAST MEDICAL HISTORY:  has a past medical history of Acute diarrhea (02/25/2015), After cataract not obscuring vision (12/21/2011), Anterior lid margin disease (12/08/2010), Apnea, sleep (07/13/2015), Arthralgia of multiple joints (06/15/2011), Arthritis, Artificial lens present (12/08/2010), Atrial fibrillation (HCC), Benign essential HTN (11/03/2010), CHF (congestive heart failure) (HCC), Chronic obstructive pulmonary disease (HCC) (11/03/2010), CN (constipation) (06/15/2011),  Cornea disorder (12/08/2010), Dizziness (02/25/2015), GERD (gastroesophageal reflux disease), Heart murmur, Hypertension, Interstitial lung disease (HCC) (10/13/2013), LBP (low back pain) (11/03/2010), Lymphangioendothelioma (02/17/2015), Peripheral vascular disease (11/03/2010), and Retinal hemorrhage (03/16/2014).    PAST SURGICAL HISTORY:  Past Surgical History:  Procedure Laterality Date   APPENDECTOMY     BACK SURGERY     CARPAL TUNNEL RELEASE Bilateral    EYE SURGERY Bilateral    Cataract Extraction with IOL   HERNIA REPAIR     Umbilical Hernia X 3   JOINT REPLACEMENT     bilat knees   NASAL SINUS SURGERY     REPLACEMENT TOTAL KNEE Bilateral    SHOULDER SURGERY Right    TONSILLECTOMY     TOTAL HIP ARTHROPLASTY Right 10/12/2015   Procedure: TOTAL HIP ARTHROPLASTY;  Surgeon: Lynwood SHAUNNA Hue, MD;  Location: [facility] ORS;  Service: Orthopedics;  Laterality: Right;   TOTAL HIP ARTHROPLASTY Left 02/15/2023   Procedure: TOTAL HIP ARTHROPLASTY;  Surgeon: Hue Lynwood SHAUNNA, MD;  Location: [facility] ORS;  Service: Orthopedics;  Laterality: Left;    FAMILY HISTORY: family history is not on file.  SOCIAL HISTORY:  reports that he has quit smoking. His smoking use included cigarettes. He has never used smokeless tobacco. He reports that he does not drink alcohol and does not use drugs.  ALLERGIES: Lisinopril and Penicillins  MEDICATIONS:  Current Outpatient Medications  Medication Sig Dispense Refill   acetaminophen  (TYLENOL ) 325 MG tablet Take 650 mg by mouth every 6 (six) hours as needed.     albuterol  (VENTOLIN  HFA) 108 (90 Base) MCG/ACT inhaler Inhale 2 puffs into the lungs every 6 (six) hours as needed for wheezing or shortness of breath.     amLODipine  (NORVASC ) 10  MG tablet Take 10 mg by mouth.     apixaban (ELIQUIS) 2.5 MG TABS tablet Take 2.5 mg by mouth.     Calcium  Carb-Cholecalciferol  (CALCIUM  600 + D PO) Take 1 tablet by mouth daily.     cholecalciferol  (VITAMIN D3) 25 MCG (1000 UNIT)  tablet Take 1,000 Units by mouth daily.     docusate sodium  (COLACE) 100 MG capsule Take 100 mg by mouth 2 (two) times daily.     enzalutamide  (XTANDI ) 40 MG tablet Take 3 tablets (120 mg total) by mouth daily. 90 tablet 2   fluticasone  (FLONASE ) 50 MCG/ACT nasal spray Place 1 spray into both nostrils 2 (two) times daily.     furosemide  (LASIX ) 40 MG tablet Take 1 tablet (40 mg total) by mouth daily.     guaiFENesin  (MUCINEX ) 600 MG 12 hr tablet Take 600 mg by mouth 2 (two) times daily.     hydrOXYzine  (ATARAX ) 10 MG tablet Take 10 mg by mouth at bedtime as needed for itching.     Lactulose  20 GM/30ML SOLN Take 30 mLs (20 g total) by mouth daily as needed. 450 mL 0   losartan  (COZAAR ) 25 MG tablet Take 0.5 tablets (12.5 mg total) by mouth daily.     metoprolol  succinate (TOPROL -XL) 25 MG 24 hr tablet Take 0.5 tablets (12.5 mg total) by mouth daily.     Multiple Vitamins-Minerals (PRESERVISION AREDS 2 PO) Take 1 capsule by mouth 2 (two) times daily.     Omega-3 Fatty Acids (FISH OIL) 300 MG CAPS Take 300 mg by mouth daily.     omeprazole (PRILOSEC) 20 MG capsule Take 20 mg by mouth daily.     polyethylene glycol (MIRALAX  / GLYCOLAX ) 17 g packet Take 17 g by mouth daily. 14 each 0   relugolix  (ORGOVYX ) 120 MG tablet Take 1 tablet (120 mg total) by mouth daily. 30 tablet 11   spironolactone  (ALDACTONE ) 25 MG tablet Take 0.5 tablets (12.5 mg total) by mouth daily.     tamsulosin  (FLOMAX ) 0.4 MG CAPS capsule Take 1 capsule (0.4 mg total) by mouth daily.     Vibegron  (GEMTESA ) 75 MG TABS Take one tablet every other day     No current facility-administered medications for this encounter.    ECOG PERFORMANCE STATUS:  2 - Symptomatic, <50% confined to bed  REVIEW OF SYSTEMS: Patient denies any weight loss, fatigue, weakness, fever, chills or night sweats. Patient denies any loss of vision, blurred vision. Patient denies any ringing  of the ears or hearing loss. No irregular heartbeat. Patient denies  heart murmur or history of fainting. Patient denies any chest pain or pain radiating to her upper extremities. Patient denies any shortness of breath, difficulty breathing at night, cough or hemoptysis. Patient denies any swelling in the lower legs. Patient denies any nausea vomiting, vomiting of blood, or coffee ground material in the vomitus. Patient denies any stomach pain. Patient states has had normal bowel movements no significant constipation or diarrhea. Patient denies any dysuria, hematuria or significant nocturia. Patient denies any problems walking, swelling in the joints or loss of balance. Patient denies any skin changes, loss of hair or loss of weight. Patient denies any excessive worrying or anxiety or significant depression. Patient denies any problems with insomnia. Patient denies excessive thirst, polyuria, polydipsia. Patient denies any swollen glands, patient denies easy bruising or easy bleeding. Patient denies any recent infections, allergies or URI. Patient s visual fields have not changed significantly in recent time.  PHYSICAL EXAM: BP (!) 97/55   Pulse (!) 53   Temp (!) 97.5 F (36.4 C) (Tympanic)   Resp 16  Does have decree strength bilaterally in his lower extremities.  Proprioception is intact.  Sensory levels appears equal and symmetric in the lower extremities.  Deep palpation of the spine does not elicit pain.  Well-developed well-nourished patient in NAD. HEENT reveals PERLA, EOMI, discs not visualized.  Oral cavity is clear. No oral mucosal lesions are identified. Neck is clear without evidence of cervical or supraclavicular adenopathy. Lungs are clear to A&P. Cardiac examination is essentially unremarkable with regular rate and rhythm without murmur rub or thrill. Abdomen is benign with no organomegaly or masses noted. Motor sensory and DTR levels are equal and symmetric in the upper and lower extremities. Cranial nerves II through XII are grossly intact. Proprioception  is intact. No peripheral adenopathy or edema is identified. No motor or sensory levels are noted. Crude visual fields are within normal range.  LABORATORY DATA: Pathology and labs reviewed    RADIOLOGY RESULTS: PSMA PET scan reviewed bone scan ordered   IMPRESSION: Stage IV castrate resistant prostate cancer with decreased strength and pain in his lower extremities and intermittent pain in his back in 89 year old male  PLAN: At this time I ordered a bone scan to see if there is been progression of disease in his spine since his PSMA PET scan back in July.  Should this show progressive disease would opt for palliative radiation therapy to his spine since some of these lesions are in close proximity to his spinal canal.  I would plan on delivering 30 Gray in 10 fractions.  Risks and benefits of treatment including possible diarrhea fatigue alteration blood counts and skin reaction all were reviewed in detail with the patient and his daughter.  I have arranged bone scan and a follow-up appointment slightly thereafter.  Patient and daughter both comprehend my recommendations well.  I would like to take this opportunity to thank you for allowing me to participate in the care of your patient.SABRA Marcey Penton, MD

## 2024-01-14 ENCOUNTER — Other Ambulatory Visit: Payer: Self-pay

## 2024-01-15 ENCOUNTER — Inpatient Hospital Stay (HOSPITAL_BASED_OUTPATIENT_CLINIC_OR_DEPARTMENT_OTHER): Admitting: Oncology

## 2024-01-15 ENCOUNTER — Inpatient Hospital Stay: Attending: Oncology

## 2024-01-15 ENCOUNTER — Inpatient Hospital Stay

## 2024-01-15 ENCOUNTER — Encounter: Payer: Self-pay | Admitting: Oncology

## 2024-01-15 VITALS — BP 126/58 | HR 69 | Temp 97.7°F | Resp 18 | Wt 185.0 lb

## 2024-01-15 DIAGNOSIS — Z23 Encounter for immunization: Secondary | ICD-10-CM

## 2024-01-15 DIAGNOSIS — M899 Disorder of bone, unspecified: Secondary | ICD-10-CM

## 2024-01-15 DIAGNOSIS — C61 Malignant neoplasm of prostate: Secondary | ICD-10-CM | POA: Insufficient documentation

## 2024-01-15 DIAGNOSIS — N1831 Chronic kidney disease, stage 3a: Secondary | ICD-10-CM

## 2024-01-15 DIAGNOSIS — C7951 Secondary malignant neoplasm of bone: Secondary | ICD-10-CM | POA: Insufficient documentation

## 2024-01-15 DIAGNOSIS — D649 Anemia, unspecified: Secondary | ICD-10-CM

## 2024-01-15 DIAGNOSIS — C775 Secondary and unspecified malignant neoplasm of intrapelvic lymph nodes: Secondary | ICD-10-CM | POA: Diagnosis not present

## 2024-01-15 LAB — CBC WITH DIFFERENTIAL (CANCER CENTER ONLY)
Abs Immature Granulocytes: 0.04 K/uL (ref 0.00–0.07)
Basophils Absolute: 0 K/uL (ref 0.0–0.1)
Basophils Relative: 0 %
Eosinophils Absolute: 0.1 K/uL (ref 0.0–0.5)
Eosinophils Relative: 1 %
HCT: 37.9 % — ABNORMAL LOW (ref 39.0–52.0)
Hemoglobin: 12.2 g/dL — ABNORMAL LOW (ref 13.0–17.0)
Immature Granulocytes: 1 %
Lymphocytes Relative: 25 %
Lymphs Abs: 1.6 K/uL (ref 0.7–4.0)
MCH: 29.3 pg (ref 26.0–34.0)
MCHC: 32.2 g/dL (ref 30.0–36.0)
MCV: 90.9 fL (ref 80.0–100.0)
Monocytes Absolute: 0.7 K/uL (ref 0.1–1.0)
Monocytes Relative: 11 %
Neutro Abs: 3.9 K/uL (ref 1.7–7.7)
Neutrophils Relative %: 62 %
Platelet Count: 181 K/uL (ref 150–400)
RBC: 4.17 MIL/uL — ABNORMAL LOW (ref 4.22–5.81)
RDW: 15.7 % — ABNORMAL HIGH (ref 11.5–15.5)
WBC Count: 6.3 K/uL (ref 4.0–10.5)
nRBC: 0 % (ref 0.0–0.2)

## 2024-01-15 LAB — CMP (CANCER CENTER ONLY)
ALT: 10 U/L (ref 0–44)
AST: 23 U/L (ref 15–41)
Albumin: 3.9 g/dL (ref 3.5–5.0)
Alkaline Phosphatase: 67 U/L (ref 38–126)
Anion gap: 8 (ref 5–15)
BUN: 43 mg/dL — ABNORMAL HIGH (ref 8–23)
CO2: 21 mmol/L — ABNORMAL LOW (ref 22–32)
Calcium: 9.5 mg/dL (ref 8.9–10.3)
Chloride: 106 mmol/L (ref 98–111)
Creatinine: 1.74 mg/dL — ABNORMAL HIGH (ref 0.61–1.24)
GFR, Estimated: 36 mL/min — ABNORMAL LOW (ref >60)
Glucose, Bld: 97 mg/dL (ref 70–99)
Potassium: 3.9 mmol/L (ref 3.5–5.1)
Sodium: 135 mmol/L (ref 135–145)
Total Bilirubin: 1.4 mg/dL — ABNORMAL HIGH (ref 0.0–1.2)
Total Protein: 6.4 g/dL — ABNORMAL LOW (ref 6.5–8.1)

## 2024-01-15 LAB — PSA: Prostatic Specific Antigen: 4.21 ng/mL — ABNORMAL HIGH (ref 0.00–4.00)

## 2024-01-15 MED ORDER — DENOSUMAB 120 MG/1.7ML ~~LOC~~ SOLN
120.0000 mg | Freq: Once | SUBCUTANEOUS | Status: AC
  Administered 2024-01-15: 120 mg via SUBCUTANEOUS
  Filled 2024-01-15: qty 1.7

## 2024-01-15 MED ORDER — INFLUENZA VAC SPLIT HIGH-DOSE 0.5 ML IM SUSY
0.5000 mL | PREFILLED_SYRINGE | INTRAMUSCULAR | Status: AC
  Administered 2024-01-15: 0.5 mL via INTRAMUSCULAR

## 2024-01-15 NOTE — Assessment & Plan Note (Addendum)
 He is not interested in prostate radiation but is open to palliative radiation to bone lesions.  He was evaluated radiation oncology and was recommend to get bone scan, If he has progression, plan palliative RT.  He has castration resistant disease.   Patient reports back pain has resolved, declined further bone scan and radiation.   Recommend bone strengthening agent. Rationale and side effects were reviewed with patient.   Recommend Xgeva Q 1- 2 months.

## 2024-01-15 NOTE — Progress Notes (Signed)
 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  Castration-resistant Patient declines prostate biopsy.  He is also not interested in radiation therapy. Recommend patient to continue androgen deprivation therapy with Orgovyx   Labs reviewed and discussed with patient. He is on dose reduced Xtandi  120 mg daily.- so far he tolerated well.   PSA has responded well.  Continue the current regimen.   Repeat PSMA in Dec 2025  Bone lesion He is not interested in prostate radiation but is open to palliative radiation to bone lesions.  He was evaluated radiation oncology and was recommend to get bone scan, If he has progression, plan palliative RT.  He has castration resistant disease.   Patient reports back pain has resolved, declined further bone scan and radiation.   Recommend bone strengthening agent. Rationale and side effects were reviewed with patient.   Recommend Xgeva Q 1- 2 months.  CKD stage 3a, GFR 45-59 ml/min (HCC) Encourage oral hydration and avoid nephrotoxins.    Normocytic anemia Hemoglobin has been stable.  Likely due to chronic kidney disease  Need for prophylactic vaccination and inoculation against influenza Influenza vaccination today   Orders Placed This Encounter  Procedures   NM PET (PSMA) SKULL TO MID THIGH    Standing Status:   Future    Expected Date:   04/02/2024    Expiration Date:   01/14/2025    If indicated for the ordered procedure, I authorize the administration of a radiopharmaceutical per Radiology protocol:   Yes    Preferred imaging location?:   [facility]   CMP (Cancer Center only)     Standing Status:   Future    Expected Date:   03/16/2024    Expiration Date:   06/14/2024   CBC with Differential (Cancer Center Only)    Standing Status:   Future    Expected Date:   03/16/2024    Expiration Date:   06/14/2024   PSA    Standing Status:   Future    Expected Date:   03/16/2024    Expiration Date:   06/14/2024   Follow-up 2 month. All questions were answered. The patient knows to call the clinic with any problems, questions or concerns.  Zelphia Cap, MD, PhD [facility] Hematology Oncology 01/15/2024    HISTORY OF PRESENTING ILLNESS:  Dennis Savage 89 y.o. male presents to establish care for prostate cancer I have reviewed his chart and materials related to his cancer extensively and collaborated history with the patient. Summary of oncologic history is as follows: Oncology History  Prostate cancer (HCC)  02/09/2022 Imaging   PSMA PET scan showed 1. No discrete prostate carcinoma within the prostate gland. 2. PSMA avid prostate cancer nodal metastasis to the RIGHT iliac vessels including common iliac, external iliac and internal iliac. 3. Probable serosal implant along the RIGHT border of the rectum. 4. No evidence of metastatic adenopathy outside the pelvis. No visceral metastasis. 5. No evidence of skeletal metastasis. 6. interval enlargement of LEFT adrenal mass. Indeterminate on comparison MRI. Low FDG activity. No PSMA PET activity.   06/13/2022 Tumor Marker   PSA 0.2   01/18/2023 Tumor Marker   PSA 41.7.  05/22/2023 Tumor Marker   PSA 3.7   10/04/2023 Tumor Marker   PSA 55.7   10/16/2023 Initial Diagnosis   Prostate cancer [facility]) Previously managed by urology.  Dr. Penne Patient has presumed stage IV prostate cancer, nodal metastatic disease to the right iliac, common iliac, external, and internal iliacs, along with a perirectal lesion, elected to defer biopsy.  Patient has been on androgen deprivation with Orgovyx    10/16/2023 Cancer Staging   Staging form:  Prostate, AJCC 8th Edition - Clinical stage from 10/16/2023: Stage Unknown (rcTX, rcNX) - Signed by Babara Call, MD on 10/16/2023 Stage prefix: Recurrence   11/06/2023 Imaging   PSMA PET scan showed 1. New intense radiotracer activity in the RIGHT lobe of the prostate gland consistent with local prostate adenocarcinoma recurrence. 2. New intense radiotracer activity at the level of the RIGHT seminal vesicle consistent with local seminal vesicle invasion. 3. New large retroperitoneal lymph node LEFT of the aorta with mild radiotracer activity. Findings concerning for nodal metastasis. 4. Several new small radiotracer avid SKELETAL METASTASIS. 5. Interval resolution of radiotracer avid RIGHT iliac nodes. 6. Stable irregular mass extending from the lower pole of the RIGHT kidney. Stable marked enlargement LEFT adrenal gland. Findings concerning for renal and adrenal neoplasm. 7. Small bilateral pleural effusions.    Patient has a past medical history of sleep apnea, arthritis, atrial fibrillation on Xarelto , history of CHF, COPD, GERD, interstitial lung disease peripheral vascular disease.  he also has a small right lower pole renal mass and left adrenal mass on surveillance. 04/22/2023, CT abdomen pelvis without contrast showed enlarging left supra renal mass, concerning for relatively indolent adrenal neoplasm.  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  Patient has complained dysuria symptoms.  He takes Azo, previously urinary and urinalysis were both negative.  Accompanied by his daughter. + Back pain.  He takes Tylenol  as needed.  INTERVAL HISTORY Dennis Savage is a 89 y.o. male who has above history reviewed by me today presents for follow up visit for metastatic prostate cancer.  11/25/2023, started on dose reduced Xtandi  120 mg daily.  He tolerated well.  No new complaints. Back pain has resolved.    MEDICAL HISTORY:  Past Medical  History:  Diagnosis Date   Acute diarrhea 02/25/2015   After cataract not obscuring vision 12/21/2011   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   Apnea, sleep 07/13/2015   Arthralgia of multiple joints 06/15/2011   Arthritis    Artificial lens present 12/08/2010   Atrial fibrillation (HCC)    Benign essential HTN 11/03/2010   CHF (congestive heart failure) (HCC)    Chronic obstructive pulmonary disease (HCC) 11/03/2010   CN (constipation) 06/15/2011   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   Dizziness 02/25/2015   GERD (gastroesophageal reflux disease)    Heart murmur    Hypertension    Interstitial lung disease (HCC) 10/13/2013   LBP (low back pain) 11/03/2010   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   Peripheral vascular disease 11/03/2010   Retinal hemorrhage 03/16/2014    SURGICAL HISTORY: Past Surgical History:  Procedure Laterality Date   APPENDECTOMY     BACK SURGERY     CARPAL TUNNEL RELEASE Bilateral    EYE SURGERY Bilateral    Cataract Extraction with IOL   HERNIA REPAIR     Umbilical Hernia X 3   JOINT REPLACEMENT     bilat knees   NASAL SINUS SURGERY     REPLACEMENT TOTAL KNEE Bilateral  SHOULDER SURGERY Right    TONSILLECTOMY     TOTAL HIP ARTHROPLASTY Right 10/12/2015   Procedure: TOTAL HIP ARTHROPLASTY;  Surgeon: Lynwood SHAUNNA Hue, MD;  Location: [facility] ORS;  Service: Orthopedics;  Laterality: Right;   TOTAL HIP ARTHROPLASTY Left 02/15/2023   Procedure: TOTAL HIP ARTHROPLASTY;  Surgeon: Hue Lynwood SHAUNNA, MD;  Location: [facility] ORS;  Service: Orthopedics;  Laterality: Left;    SOCIAL HISTORY: Social History   Socioeconomic History   Marital status: Single    Spouse name: Not on file   Number of children: Not on file   Years of education: Not on file   Highest education level: Not on file  Occupational History   Not on file  Tobacco Use   Smoking status: Former    Current packs/day: 1.00    Types: Cigarettes   Smokeless tobacco: Never  Vaping Use   Vaping status:  Never Used  Substance and Sexual Activity   Alcohol use: No   Drug use: No   Sexual activity: Not Currently  Other Topics Concern   Not on file  Social History Narrative   Not on file   Social Drivers of Health   Financial Resource Strain: Low Risk  (10/29/2023)   Received from [facility]   Overall Financial Resource Strain (CARDIA)    Difficulty of Paying Living Expenses: Not hard at all  Food Insecurity: No Food Insecurity (10/29/2023)   [facility]   Hunger Vital Sign    Within the past 12 months, you worried that your food would run out before you got the money to buy more.: Never true    Within the past 12 months, the food you bought just didn't last and you didn't have money to get more.: Never true  Transportation Needs: No Transportation Needs (10/29/2023)   Received from [facility] - Transportation    In the past 12 months, has lack of transportation kept you from medical appointments or from getting medications?: No    Lack of Transportation (Non-Medical): No  Physical Activity: Not on file  Stress: Not on file  Social Connections: Moderately Isolated (08/10/2023)   Social Connection and Isolation Panel    Frequency of Communication with Friends and Family: More than three times a week    Frequency of Social Gatherings with Friends and Family: More than three times a week    Attends Religious Services: More than 4 times per year    Active Member of [employer] or Organizations: No    Attends Banker Meetings: Never    Marital Status: Divorced  Catering manager Violence: Not At Risk (10/16/2023)   Humiliation, Afraid, Rape, and Kick questionnaire    Fear of Current or Ex-Partner: No    Emotionally Abused: No    Physically Abused: No    Sexually Abused: No    FAMILY HISTORY: Family History  Problem Relation Age of Onset   Bladder Cancer Neg Hx    Kidney cancer Neg Hx    Prostate  cancer Neg Hx     ALLERGIES:  is allergic to lisinopril and penicillins.  MEDICATIONS:  Current Outpatient Medications  Medication Sig Dispense Refill   acetaminophen  (TYLENOL ) 325 MG tablet Take 650 mg by mouth every 6 (six) hours as needed.     albuterol  (VENTOLIN  HFA) 108 (90 Base) MCG/ACT inhaler Inhale 2 puffs into the lungs every 6 (six) hours as needed for wheezing or shortness of  breath.     amLODipine  (NORVASC ) 10 MG tablet Take 10 mg by mouth.     apixaban (ELIQUIS) 2.5 MG TABS tablet Take 2.5 mg by mouth.     Calcium  Carb-Cholecalciferol  (CALCIUM  600 + D PO) Take 1 tablet by mouth daily.     cholecalciferol  (VITAMIN D3) 25 MCG (1000 UNIT) tablet Take 1,000 Units by mouth daily.     docusate sodium  (COLACE) 100 MG capsule Take 100 mg by mouth 2 (two) times daily.     enzalutamide  (XTANDI ) 40 MG tablet Take 3 tablets (120 mg total) by mouth daily. 90 tablet 2   fluticasone  (FLONASE ) 50 MCG/ACT nasal spray Place 1 spray into both nostrils 2 (two) times daily.     furosemide  (LASIX ) 40 MG tablet Take 1 tablet (40 mg total) by mouth daily.     guaiFENesin  (MUCINEX ) 600 MG 12 hr tablet Take 600 mg by mouth 2 (two) times daily.     hydrOXYzine  (ATARAX ) 10 MG tablet Take 10 mg by mouth at bedtime as needed for itching.     Lactulose  20 GM/30ML SOLN Take 30 mLs (20 g total) by mouth daily as needed. 450 mL 0   losartan  (COZAAR ) 25 MG tablet Take 0.5 tablets (12.5 mg total) by mouth daily.     metoprolol  succinate (TOPROL -XL) 25 MG 24 hr tablet Take 0.5 tablets (12.5 mg total) by mouth daily.     Multiple Vitamins-Minerals (PRESERVISION AREDS 2 PO) Take 1 capsule by mouth 2 (two) times daily.     Omega-3 Fatty Acids (FISH OIL) 300 MG CAPS Take 300 mg by mouth daily.     omeprazole (PRILOSEC) 20 MG capsule Take 20 mg by mouth daily.     polyethylene glycol (MIRALAX  / GLYCOLAX ) 17 g packet Take 17 g by mouth daily. 14 each 0   relugolix  (ORGOVYX ) 120 MG tablet Take 1 tablet (120 mg total)  by mouth daily. 30 tablet 11   spironolactone  (ALDACTONE ) 25 MG tablet Take 0.5 tablets (12.5 mg total) by mouth daily.     tamsulosin  (FLOMAX ) 0.4 MG CAPS capsule Take 1 capsule (0.4 mg total) by mouth daily.     Vibegron  (GEMTESA ) 75 MG TABS Take one tablet every other day     No current facility-administered medications for this visit.    Review of Systems  Constitutional:  Positive for fatigue. Negative for appetite change, chills, fever and unexpected weight change.  HENT:   Negative for hearing loss and voice change.   Eyes:  Negative for eye problems and icterus.  Respiratory:  Negative for chest tightness, cough and shortness of breath.   Cardiovascular:  Positive for leg swelling. Negative for chest pain.  Gastrointestinal:  Negative for abdominal distention and abdominal pain.  Endocrine: Negative for hot flashes.  Genitourinary:  Positive for dysuria. Negative for difficulty urinating and frequency.   Musculoskeletal:  Negative for arthralgias.  Skin:  Negative for itching and rash.  Neurological:  Negative for light-headedness and numbness.  Hematological:  Negative for adenopathy. Does not bruise/bleed easily.  Psychiatric/Behavioral:  Negative for confusion.      PHYSICAL EXAMINATION: ECOG PERFORMANCE STATUS: 3 - Symptomatic, >50% confined to bed  Vitals:   01/15/24 1106  BP: (!) 126/58  Pulse: 69  Resp: 18  Temp: 97.7 F (36.5 C)  SpO2: 100%   Filed Weights   01/15/24 1106  Weight: 185 lb (83.9 kg)    Physical Exam Constitutional:      General: He is not in  acute distress.    Comments: Patient states in the wheelchair  HENT:     Head: Normocephalic and atraumatic.  Eyes:     General: No scleral icterus. Cardiovascular:     Rate and Rhythm: Normal rate and regular rhythm.     Heart sounds: No murmur heard. Pulmonary:     Effort: Pulmonary effort is normal. No respiratory distress.     Breath sounds: No wheezing.     Comments: Decreased breath  sound bilaterally Abdominal:     General: Bowel sounds are normal. There is no distension.     Palpations: Abdomen is soft.     Tenderness: There is no abdominal tenderness.  Musculoskeletal:        General: Normal range of motion.     Cervical back: Normal range of motion and neck supple.     Right lower leg: Edema present.     Left lower leg: Edema present.  Skin:    General: Skin is warm and dry.     Findings: No erythema.  Neurological:     Mental Status: He is alert. Mental status is at baseline.     Cranial Nerves: No cranial nerve deficit.     Motor: No abnormal muscle tone.  Psychiatric:        Mood and Affect: Mood and affect normal.      LABORATORY DATA:  I have reviewed the data as listed    Latest Ref Rng & Units 01/15/2024   10:46 AM 12/18/2023    9:48 AM 10/16/2023   11:53 AM  CBC  WBC 4.0 - 10.5 K/uL 6.3  6.5  5.3   Hemoglobin 13.0 - 17.0 g/dL 87.7  87.8  88.2   Hematocrit 39.0 - 52.0 % 37.9  39.1  37.0   Platelets 150 - 400 K/uL 181  202  244       Latest Ref Rng & Units 01/15/2024   10:46 AM 12/18/2023    9:49 AM 10/16/2023   11:52 AM  CMP  Glucose 70 - 99 mg/dL 97  880  896   BUN 8 - 23 mg/dL 43  37  36   Creatinine 0.61 - 1.24 mg/dL 8.25  8.48  8.48   Sodium 135 - 145 mmol/L 135  140  138   Potassium 3.5 - 5.1 mmol/L 3.9  4.4  3.7   Chloride 98 - 111 mmol/L 106  108  108   CO2 22 - 32 mmol/L 21  24  23    Calcium  8.9 - 10.3 mg/dL 9.5  9.4  9.2   Total Protein 6.5 - 8.1 g/dL 6.4  5.9  6.7   Total Bilirubin 0.0 - 1.2 mg/dL 1.4  1.2  1.0   Alkaline Phos 38 - 126 U/L 67  75  67   AST 15 - 41 U/L 23  22  21    ALT 0 - 44 U/L 10  10  13       RADIOGRAPHIC STUDIES: I have personally reviewed the radiological images as listed and agreed with the findings in the report. No results found.

## 2024-01-15 NOTE — Assessment & Plan Note (Signed)
 Encourage oral hydration and avoid nephrotoxins.

## 2024-01-15 NOTE — Assessment & Plan Note (Signed)
Influenza vaccination today 

## 2024-01-15 NOTE — Assessment & Plan Note (Signed)
 Hemoglobin has been stable.  Likely due to chronic kidney disease

## 2024-01-15 NOTE — Assessment & Plan Note (Addendum)
 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  Castration-resistant Patient declines prostate biopsy.  He is also not interested in radiation therapy. Recommend patient to continue androgen deprivation therapy with Orgovyx   Labs reviewed and discussed with patient. He is on dose reduced Xtandi  120 mg daily.- so far he tolerated well.   PSA has responded well.  Continue the current regimen.   Repeat PSMA in Dec 2025

## 2024-01-16 ENCOUNTER — Other Ambulatory Visit: Payer: Self-pay

## 2024-01-16 NOTE — Progress Notes (Signed)
 Specialty Pharmacy Refill Coordination Note  Dennis Savage is a 89 y.o. male contacted today regarding refills of specialty medication(s) Enzalutamide  (XTANDI )   Patient requested Delivery   Delivery date: 01/20/24   Verified address: [address] [place]   Medication will be filled on 01/17/24.

## 2024-01-21 ENCOUNTER — Other Ambulatory Visit

## 2024-01-22 ENCOUNTER — Other Ambulatory Visit (HOSPITAL_COMMUNITY): Payer: Self-pay

## 2024-01-22 ENCOUNTER — Other Ambulatory Visit: Payer: Self-pay

## 2024-01-22 NOTE — Progress Notes (Signed)
 LVM for patient's daughter to expect package today. Same-Day courier (NO signature required) sending 01/22/24.  Control Number: [id number]  Patient's daughter, [name] called to inform us  that they did not receive patient's Xtandi . Per tracking number, package was delivered on 10/06 at 2:19pm. Driver image was unclear. Christen/Rai informed of driver image recent issues with [employer]. Ne'Asia from [employer] confirmed that the medication was delivered to the incorrect address. A driver will return that package back to our pharmacy. All shipping error forms have been completed. Inventory will be adjusted appropriately.

## 2024-01-23 ENCOUNTER — Other Ambulatory Visit (HOSPITAL_COMMUNITY): Payer: Self-pay

## 2024-01-24 ENCOUNTER — Other Ambulatory Visit (HOSPITAL_COMMUNITY): Payer: Self-pay

## 2024-01-27 ENCOUNTER — Ambulatory Visit: Admitting: Radiation Oncology

## 2024-02-03 ENCOUNTER — Telehealth: Payer: Self-pay | Admitting: Urology

## 2024-02-03 DIAGNOSIS — R300 Dysuria: Secondary | ICD-10-CM

## 2024-02-03 DIAGNOSIS — R351 Nocturia: Secondary | ICD-10-CM

## 2024-02-03 NOTE — Telephone Encounter (Signed)
 Pt daughter called into the office and stated that her father was given Gemtesa  at his last visit to try and it seems to be working and he would like to get a prescription sent to his pharmacy. If you have any questions she can be reached at [phone].

## 2024-02-04 MED ORDER — GEMTESA 75 MG PO TABS: 75.0000 mg | ORAL_TABLET | Freq: Every day | ORAL | 3 refills | Status: AC

## 2024-02-04 NOTE — Addendum Note (Signed)
 Addended by: CLEOTILDE HELLER L on: 02/04/2024 01:21 PM   Modules accepted: Orders

## 2024-02-05 ENCOUNTER — Telehealth: Payer: Self-pay

## 2024-02-05 NOTE — Telephone Encounter (Signed)
 Started PA on Cover my meds for Gemtesa 

## 2024-02-05 NOTE — Telephone Encounter (Signed)
 Patient's daughter Dennis Savage called and stated that she contacted the pharmacy regarding patient's Gemtesa . She was told by pharmacy that they did receive script but need a verbal confirmation from provider in order for insurance to cover medication. Please advise.

## 2024-02-06 ENCOUNTER — Other Ambulatory Visit: Payer: Self-pay | Admitting: Oncology

## 2024-02-06 ENCOUNTER — Other Ambulatory Visit (HOSPITAL_COMMUNITY): Payer: Self-pay

## 2024-02-06 ENCOUNTER — Other Ambulatory Visit: Payer: Self-pay

## 2024-02-06 DIAGNOSIS — C61 Malignant neoplasm of prostate: Secondary | ICD-10-CM

## 2024-02-06 MED ORDER — ENZALUTAMIDE 40 MG PO TABS
120.0000 mg | ORAL_TABLET | Freq: Every day | ORAL | 2 refills | Status: DC
  Filled 2024-02-06 – 2024-02-11 (×2): qty 90, 30d supply, fill #0
  Filled 2024-03-05 – 2024-03-09 (×2): qty 90, 30d supply, fill #1

## 2024-02-07 ENCOUNTER — Other Ambulatory Visit: Payer: Self-pay

## 2024-02-11 ENCOUNTER — Other Ambulatory Visit: Payer: Self-pay

## 2024-02-11 NOTE — Progress Notes (Signed)
 Specialty Pharmacy Refill Coordination Note  Dennis Savage is a 89 y.o. male contacted today regarding refills of specialty medication(s) Enzalutamide  (XTANDI )   Patient requested Delivery   Delivery date: 02/17/24   Verified address: [address] [place]   Medication will be filled on: 02/14/24

## 2024-02-13 ENCOUNTER — Other Ambulatory Visit: Payer: Self-pay

## 2024-02-26 ENCOUNTER — Other Ambulatory Visit: Payer: Self-pay

## 2024-02-26 MED ORDER — ORGOVYX 120 MG PO TABS: 120.0000 mg | ORAL_TABLET | Freq: Every day | ORAL | 3 refills | Status: AC

## 2024-02-27 ENCOUNTER — Other Ambulatory Visit: Payer: Self-pay

## 2024-03-03 ENCOUNTER — Other Ambulatory Visit: Payer: Self-pay

## 2024-03-05 ENCOUNTER — Other Ambulatory Visit: Payer: Self-pay

## 2024-03-05 ENCOUNTER — Other Ambulatory Visit (HOSPITAL_COMMUNITY): Payer: Self-pay

## 2024-03-09 ENCOUNTER — Other Ambulatory Visit: Payer: Self-pay

## 2024-03-09 NOTE — Progress Notes (Signed)
 Specialty Pharmacy Ongoing Clinical Assessment Note  Dennis Savage is a 89 y.o. male who is being followed by the specialty pharmacy service for RxSp Oncology   Patient's specialty medication(s) reviewed today: Enzalutamide  (XTANDI )   Missed doses in the last 4 weeks: 0   Patient/Caregiver did not have any additional questions or concerns.   Therapeutic benefit summary: Patient is achieving benefit   Adverse events/side effects summary: No adverse events/side effects   Patient's therapy is appropriate to: Continue    Goals Addressed             This Visit's Progress    Slow Disease Progression   On track    Patient is on track. Patient will maintain adherence. Patient's PSA has dropped to 4.21 ng/mL as of 01/15/24         Follow up: 3 months  [facility] Specialty Pharmacist

## 2024-03-09 NOTE — Progress Notes (Signed)
 Specialty Pharmacy Refill Coordination Note  Dennis Savage is a 89 y.o. male contacted today regarding refills of specialty medication(s) Enzalutamide  (XTANDI )   Patient requested Delivery   Delivery date: 03/18/24   Verified address: [address], [place]   Medication will be filled on: 03/17/24

## 2024-03-16 ENCOUNTER — Other Ambulatory Visit: Payer: Self-pay

## 2024-03-18 ENCOUNTER — Other Ambulatory Visit (HOSPITAL_COMMUNITY): Payer: Self-pay

## 2024-03-18 ENCOUNTER — Other Ambulatory Visit: Payer: Self-pay

## 2024-03-18 ENCOUNTER — Encounter: Payer: Self-pay | Admitting: Oncology

## 2024-03-18 ENCOUNTER — Inpatient Hospital Stay

## 2024-03-18 ENCOUNTER — Inpatient Hospital Stay: Admitting: Oncology

## 2024-03-18 ENCOUNTER — Inpatient Hospital Stay: Attending: Oncology

## 2024-03-18 VITALS — BP 128/69 | HR 50 | Temp 96.0°F | Resp 18 | Wt 187.4 lb

## 2024-03-18 DIAGNOSIS — C61 Malignant neoplasm of prostate: Secondary | ICD-10-CM

## 2024-03-18 DIAGNOSIS — C7951 Secondary malignant neoplasm of bone: Secondary | ICD-10-CM | POA: Diagnosis present

## 2024-03-18 DIAGNOSIS — D649 Anemia, unspecified: Secondary | ICD-10-CM

## 2024-03-18 DIAGNOSIS — M899 Disorder of bone, unspecified: Secondary | ICD-10-CM

## 2024-03-18 DIAGNOSIS — N1831 Chronic kidney disease, stage 3a: Secondary | ICD-10-CM | POA: Diagnosis not present

## 2024-03-18 LAB — CBC WITH DIFFERENTIAL (CANCER CENTER ONLY)
Abs Immature Granulocytes: 0.1 K/uL — ABNORMAL HIGH (ref 0.00–0.07)
Basophils Absolute: 0 K/uL (ref 0.0–0.1)
Basophils Relative: 0 %
Eosinophils Absolute: 0.1 K/uL (ref 0.0–0.5)
Eosinophils Relative: 1 %
HCT: 43.5 % (ref 39.0–52.0)
Hemoglobin: 14.1 g/dL (ref 13.0–17.0)
Immature Granulocytes: 1 %
Lymphocytes Relative: 24 %
Lymphs Abs: 2 K/uL (ref 0.7–4.0)
MCH: 30.2 pg (ref 26.0–34.0)
MCHC: 32.4 g/dL (ref 30.0–36.0)
MCV: 93.1 fL (ref 80.0–100.0)
Monocytes Absolute: 0.9 K/uL (ref 0.1–1.0)
Monocytes Relative: 11 %
Neutro Abs: 5.2 K/uL (ref 1.7–7.7)
Neutrophils Relative %: 63 %
Platelet Count: 183 K/uL (ref 150–400)
RBC: 4.67 MIL/uL (ref 4.22–5.81)
RDW: 14.4 % (ref 11.5–15.5)
WBC Count: 8.3 K/uL (ref 4.0–10.5)
nRBC: 0 % (ref 0.0–0.2)

## 2024-03-18 LAB — CMP (CANCER CENTER ONLY)
ALT: 9 U/L (ref 0–44)
AST: 21 U/L (ref 15–41)
Albumin: 4 g/dL (ref 3.5–5.0)
Alkaline Phosphatase: 70 U/L (ref 38–126)
Anion gap: 12 (ref 5–15)
BUN: 41 mg/dL — ABNORMAL HIGH (ref 8–23)
CO2: 23 mmol/L (ref 22–32)
Calcium: 9.3 mg/dL (ref 8.9–10.3)
Chloride: 105 mmol/L (ref 98–111)
Creatinine: 1.78 mg/dL — ABNORMAL HIGH (ref 0.61–1.24)
GFR, Estimated: 35 mL/min — ABNORMAL LOW (ref >60)
Glucose, Bld: 96 mg/dL (ref 70–99)
Potassium: 4.4 mmol/L (ref 3.5–5.1)
Sodium: 140 mmol/L (ref 135–145)
Total Bilirubin: 0.7 mg/dL (ref 0.0–1.2)
Total Protein: 6.6 g/dL (ref 6.5–8.1)

## 2024-03-18 LAB — PSA: Prostatic Specific Antigen: 7.17 ng/mL — ABNORMAL HIGH (ref 0.00–4.00)

## 2024-03-18 MED ORDER — DENOSUMAB 120 MG/1.7ML ~~LOC~~ SOLN
120.0000 mg | Freq: Once | SUBCUTANEOUS | Status: AC
  Administered 2024-03-18: 120 mg via SUBCUTANEOUS
  Filled 2024-03-18: qty 1.7

## 2024-03-18 MED ORDER — ENZALUTAMIDE 40 MG PO TABS
120.0000 mg | ORAL_TABLET | Freq: Every day | ORAL | 2 refills | Status: DC
  Filled 2024-03-18 – 2024-04-20 (×3): qty 90, 30d supply, fill #0

## 2024-03-18 NOTE — Assessment & Plan Note (Addendum)
 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  Castration-resistant Patient declines prostate biopsy.  He is also not interested in radiation therapy. Recommend patient to continue androgen deprivation therapy with Orgovyx   Labs reviewed and discussed with patient. He is on dose reduced Xtandi  120 mg daily.- so far he tolerated well.   PSA has responded well. Today's level is pending Continue the current regimen.   Repeat PSMA in Dec 2025

## 2024-03-18 NOTE — Assessment & Plan Note (Signed)
 Encourage oral hydration and avoid nephrotoxins.

## 2024-03-18 NOTE — Progress Notes (Signed)
 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  Castration-resistant Patient declines prostate biopsy.  He is also not interested in radiation therapy. Recommend patient to continue androgen deprivation therapy with Orgovyx   Labs reviewed and discussed with patient. He is on dose reduced Xtandi  120 mg daily.- so far he tolerated well.   PSA has responded well. Today's level is pending Continue the current regimen.   Repeat PSMA in Dec 2025  Bone lesion He is not interested in prostate radiation but is open to palliative radiation to bone lesions.  He was evaluated radiation oncology and was recommend to get bone scan, If he has progression, plan palliative RT.  He has castration resistant disease.   Patient reports back pain has resolved, declined further bone scan and radiation.   Xgeva  Q 2 months.  CKD stage 3a, GFR 45-59 ml/min (HCC) Encourage oral hydration and avoid nephrotoxins.    Normocytic anemia Hemoglobin has been stable.  Likely due to chronic kidney disease   Orders Placed This Encounter  Procedures   CBC with Differential (Cancer Center Only)    Standing Status:   Future    Expected Date:   05/19/2024    Expiration Date:   08/17/2024   CMP (Cancer Center only)    Standing Status:   Future    Expected Date:   05/19/2024    Expiration Date:   08/17/2024   PSA    Standing Status:   Future    Expected Date:   05/19/2024    Expiration Date:   08/17/2024   Follow-up 2 month. All questions were answered. The patient knows to call the clinic with any problems, questions or concerns.  Zelphia Cap, MD, PhD [facility]  Hematology Oncology 03/18/2024    HISTORY OF PRESENTING ILLNESS:  Dennis Savage 89 y.o. male presents to establish care for prostate cancer I have reviewed his chart and materials related to his cancer extensively and collaborated history with the patient. Summary of oncologic history is as follows: Oncology History  Prostate cancer (HCC)  02/09/2022 Imaging   PSMA PET scan showed 1. No discrete prostate carcinoma within the prostate gland. 2. PSMA avid prostate cancer nodal metastasis to the RIGHT iliac vessels including common iliac, external iliac and internal iliac. 3. Probable serosal implant along the RIGHT border of the rectum. 4. No evidence of metastatic adenopathy outside the pelvis. No visceral metastasis. 5. No evidence of skeletal metastasis. 6. interval enlargement of LEFT adrenal mass. Indeterminate on comparison MRI. Low FDG activity. No PSMA PET activity.   06/13/2022 Tumor Marker   PSA 0.2   01/18/2023 Tumor Marker   PSA 41.7.   05/22/2023 Tumor Marker   PSA 3.7   10/04/2023 Tumor Marker   PSA 55.7   10/16/2023 Initial Diagnosis   Prostate cancer [facility]) Previously managed by urology.  Dr. Penne Patient has presumed stage IV prostate cancer, nodal metastatic disease to the right iliac, common iliac, external, and internal iliacs, along with a perirectal lesion, elected to defer biopsy.  Patient has been on androgen deprivation with Orgovyx    10/16/2023 Cancer Staging   Staging form: Prostate, AJCC 8th Edition - Clinical stage from 10/16/2023:  Stage Unknown (rcTX, rcNX) - Signed by Babara Call, MD on 10/16/2023 Stage prefix: Recurrence   11/06/2023 Imaging   PSMA PET scan showed 1. New intense radiotracer activity in the RIGHT lobe of the prostate gland consistent with local prostate adenocarcinoma recurrence. 2. New intense radiotracer activity at the level of the RIGHT seminal vesicle consistent with local seminal vesicle invasion. 3. New large retroperitoneal  lymph node LEFT of the aorta with mild radiotracer activity. Findings concerning for nodal metastasis. 4. Several new small radiotracer avid SKELETAL METASTASIS. 5. Interval resolution of radiotracer avid RIGHT iliac nodes. 6. Stable irregular mass extending from the lower pole of the RIGHT kidney. Stable marked enlargement LEFT adrenal gland. Findings concerning for renal and adrenal neoplasm. 7. Small bilateral pleural effusions.    Patient has a past medical history of sleep apnea, arthritis, atrial fibrillation on Xarelto , history of CHF, COPD, GERD, interstitial lung disease peripheral vascular disease.  he also has a small right lower pole renal mass and left adrenal mass on surveillance. 04/22/2023, CT abdomen pelvis without contrast showed enlarging left supra renal mass, concerning for relatively indolent adrenal neoplasm.  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  Patient has complained dysuria symptoms.  He takes Azo, previously urinary and urinalysis were both negative.  Accompanied by his daughter. + Back pain.  He takes Tylenol  as needed.  INTERVAL HISTORY Dennis Savage is a 89 y.o. male who has above history reviewed by me today presents for follow up visit for metastatic prostate cancer.  11/25/2023, started on dose reduced Xtandi  120 mg daily.  He tolerated well.  No new complaints. Back pain has resolved.    MEDICAL HISTORY:  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   Apnea, sleep 07/13/2015   Arthralgia of multiple joints 06/15/2011   Arthritis    Artificial lens present 12/08/2010   Atrial fibrillation (HCC)    Benign essential HTN 11/03/2010   CHF (congestive heart failure) (HCC)    Chronic obstructive pulmonary disease (HCC) 11/03/2010   CN (constipation) 06/15/2011   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    Dizziness 02/25/2015   GERD (gastroesophageal reflux disease)    Heart murmur    Hypertension    Interstitial lung disease (HCC) 10/13/2013   LBP (low back pain) 11/03/2010   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   Peripheral vascular disease 11/03/2010   Retinal hemorrhage 03/16/2014    SURGICAL HISTORY: Past Surgical History:  Procedure Laterality Date   APPENDECTOMY     BACK SURGERY     CARPAL TUNNEL RELEASE Bilateral    EYE SURGERY Bilateral    Cataract Extraction with IOL   HERNIA REPAIR     Umbilical Hernia X 3   JOINT REPLACEMENT     bilat knees   NASAL SINUS SURGERY     REPLACEMENT TOTAL KNEE Bilateral    SHOULDER SURGERY Right    TONSILLECTOMY     TOTAL HIP ARTHROPLASTY Right 10/12/2015   Procedure: TOTAL HIP ARTHROPLASTY;  Surgeon: Lynwood SHAUNNA Hue, MD;  Location: [facility] ORS;  Service: Orthopedics;  Laterality: Right;   TOTAL HIP ARTHROPLASTY Left 02/15/2023   Procedure: TOTAL HIP ARTHROPLASTY;  Surgeon: Hue Lynwood SHAUNNA, MD;  Location: [facility] ORS;  Service: Orthopedics;  Laterality: Left;    SOCIAL HISTORY: Social History   Socioeconomic History   Marital status: Single    Spouse name: Not  on file   Number of children: Not on file   Years of education: Not on file   Highest education level: Not on file  Occupational History   Not on file  Tobacco Use   Smoking status: Former    Current packs/day: 1.00    Types: Cigarettes   Smokeless tobacco: Never  Vaping Use   Vaping status: Never Used  Substance and Sexual Activity   Alcohol use: No   Drug use: No   Sexual activity: Not Currently  Other Topics Concern   Not on file  Social History Narrative   Not on file   Social Drivers of Health   Financial Resource Strain: Low Risk  (02/05/2024)   [facility]   Overall Financial Resource Strain (CARDIA)    Difficulty of Paying Living Expenses: Not hard at all  Food Insecurity: No Food Insecurity (02/05/2024)   Received from [facility]   Hunger Vital Sign    Within the past 12 months, you worried that your food would run out before you got the money to buy more.: Never true    Within the past 12 months, the food you bought just didn't last and you didn't have money to get more.: Never true  Transportation Needs: No Transportation Needs (02/05/2024)   Received from [facility] - Transportation    In the past 12 months, has lack of transportation kept you from medical appointments or from getting medications?: No    Lack of Transportation (Non-Medical): No  Physical Activity: Not on file  Stress: Not on file  Social Connections: Moderately Isolated (08/10/2023)   Social Connection and Isolation Panel    Frequency of Communication with Friends and Family: More than three times a week    Frequency of Social Gatherings with Friends and Family: More than three times a week    Attends Religious Services: More than 4 times per year    Active Member of [employer] or Organizations: No    Attends Banker Meetings: Never    Marital Status: Divorced  Catering Manager Violence: Not At Risk (10/16/2023)   Humiliation, Afraid, Rape, and Kick questionnaire    Fear of Current or Ex-Partner: No    Emotionally Abused: No    Physically Abused: No    Sexually Abused: No    FAMILY HISTORY: Family History  Problem Relation Age of Onset   Bladder Cancer Neg Hx    Kidney cancer Neg Hx    Prostate cancer Neg Hx     ALLERGIES:  is allergic to lisinopril and penicillins.  MEDICATIONS:  Current Outpatient Medications  Medication Sig Dispense Refill   acetaminophen  (TYLENOL ) 325 MG tablet Take 650 mg by mouth every 6 (six) hours as needed.     albuterol  (VENTOLIN  HFA) 108 (90 Base) MCG/ACT inhaler Inhale 2 puffs into the lungs every 6 (six) hours as needed for wheezing or shortness of breath.     apixaban (ELIQUIS) 2.5 MG TABS tablet Take 2.5 mg by mouth.     Calcium   Carb-Cholecalciferol  (CALCIUM  600 + D PO) Take 1 tablet by mouth daily.     cholecalciferol  (VITAMIN D3) 25 MCG (1000 UNIT) tablet Take 1,000 Units by mouth daily.     docusate sodium  (COLACE) 100 MG capsule Take 100 mg by mouth 2 (two) times daily.     fluticasone  (FLONASE ) 50 MCG/ACT nasal spray Place 1 spray into both nostrils 2 (two) times  daily.     furosemide  (LASIX ) 40 MG tablet Take 1 tablet (40 mg total) by mouth daily.     guaiFENesin  (MUCINEX ) 600 MG 12 hr tablet Take 600 mg by mouth 2 (two) times daily.     hydrOXYzine  (ATARAX ) 10 MG tablet Take 10 mg by mouth at bedtime as needed for itching.     Lactulose  20 GM/30ML SOLN Take 30 mLs (20 g total) by mouth daily as needed. 450 mL 0   losartan  (COZAAR ) 25 MG tablet Take 0.5 tablets (12.5 mg total) by mouth daily.     metoprolol  succinate (TOPROL -XL) 25 MG 24 hr tablet Take 0.5 tablets (12.5 mg total) by mouth daily.     Multiple Vitamins-Minerals (PRESERVISION AREDS 2 PO) Take 1 capsule by mouth 2 (two) times daily.     Omega-3 Fatty Acids (FISH OIL) 300 MG CAPS Take 300 mg by mouth daily.     omeprazole (PRILOSEC) 20 MG capsule Take 20 mg by mouth daily.     polyethylene glycol (MIRALAX  / GLYCOLAX ) 17 g packet Take 17 g by mouth daily. 14 each 0   relugolix  (ORGOVYX ) 120 MG tablet Take 1 tablet (120 mg total) by mouth daily. 30 tablet 3   spironolactone  (ALDACTONE ) 25 MG tablet Take 0.5 tablets (12.5 mg total) by mouth daily.     tamsulosin  (FLOMAX ) 0.4 MG CAPS capsule Take 1 capsule (0.4 mg total) by mouth daily.     Vibegron  (GEMTESA ) 75 MG TABS Take one tablet every other day     Vibegron  (GEMTESA ) 75 MG TABS Take 1 tablet (75 mg total) by mouth daily. 30 tablet 3   enzalutamide  (XTANDI ) 40 MG tablet Take 3 tablets (120 mg total) by mouth daily. 90 tablet 2   No current facility-administered medications for this visit.    Review of Systems  Constitutional:  Positive for fatigue. Negative for appetite change, chills, fever and  unexpected weight change.  HENT:   Negative for hearing loss and voice change.   Eyes:  Negative for eye problems and icterus.  Respiratory:  Negative for chest tightness, cough and shortness of breath.   Cardiovascular:  Positive for leg swelling. Negative for chest pain.  Gastrointestinal:  Negative for abdominal distention and abdominal pain.  Endocrine: Negative for hot flashes.  Genitourinary:  Positive for dysuria. Negative for difficulty urinating and frequency.   Musculoskeletal:  Negative for arthralgias.  Skin:  Negative for itching and rash.  Neurological:  Negative for light-headedness and numbness.  Hematological:  Negative for adenopathy. Does not bruise/bleed easily.  Psychiatric/Behavioral:  Negative for confusion.      PHYSICAL EXAMINATION: ECOG PERFORMANCE STATUS: 3 - Symptomatic, >50% confined to bed  Vitals:   03/18/24 1312  BP: 128/69  Pulse: (!) 50  Resp: 18  Temp: (!) 96 F (35.6 C)  SpO2: 97%   Filed Weights   03/18/24 1312  Weight: 187 lb 6.4 oz (85 kg)    Physical Exam Constitutional:      General: He is not in acute distress.    Comments: Patient states in the wheelchair  HENT:     Head: Normocephalic and atraumatic.  Eyes:     General: No scleral icterus. Cardiovascular:     Rate and Rhythm: Normal rate and regular rhythm.     Heart sounds: No murmur heard. Pulmonary:     Effort: Pulmonary effort is normal. No respiratory distress.     Breath sounds: No wheezing.     Comments: Decreased breath  sound bilaterally Abdominal:     General: Bowel sounds are normal. There is no distension.     Palpations: Abdomen is soft.     Tenderness: There is no abdominal tenderness.  Musculoskeletal:        General: Normal range of motion.     Cervical back: Normal range of motion and neck supple.     Right lower leg: Edema present.     Left lower leg: Edema present.  Skin:    General: Skin is warm and dry.     Findings: No erythema.  Neurological:      Mental Status: He is alert. Mental status is at baseline.     Cranial Nerves: No cranial nerve deficit.     Motor: No abnormal muscle tone.  Psychiatric:        Mood and Affect: Mood and affect normal.      LABORATORY DATA:  I have reviewed the data as listed    Latest Ref Rng & Units 03/18/2024   12:36 PM 01/15/2024   10:46 AM 12/18/2023    9:48 AM  CBC  WBC 4.0 - 10.5 K/uL 8.3  6.3  6.5   Hemoglobin 13.0 - 17.0 g/dL 85.8  87.7  87.8   Hematocrit 39.0 - 52.0 % 43.5  37.9  39.1   Platelets 150 - 400 K/uL 183  181  202       Latest Ref Rng & Units 03/18/2024   12:36 PM 01/15/2024   10:46 AM 12/18/2023    9:49 AM  CMP  Glucose 70 - 99 mg/dL 96  97  880   BUN 8 - 23 mg/dL 41  43  37   Creatinine 0.61 - 1.24 mg/dL 8.21  8.25  8.48   Sodium 135 - 145 mmol/L 140  135  140   Potassium 3.5 - 5.1 mmol/L 4.4  3.9  4.4   Chloride 98 - 111 mmol/L 105  106  108   CO2 22 - 32 mmol/L 23  21  24    Calcium  8.9 - 10.3 mg/dL 9.3  9.5  9.4   Total Protein 6.5 - 8.1 g/dL 6.6  6.4  5.9   Total Bilirubin 0.0 - 1.2 mg/dL 0.7  1.4  1.2   Alkaline Phos 38 - 126 U/L 70  67  75   AST 15 - 41 U/L 21  23  22    ALT 0 - 44 U/L 9  10  10       RADIOGRAPHIC STUDIES: I have personally reviewed the radiological images as listed and agreed with the findings in the report. No results found.

## 2024-03-18 NOTE — Assessment & Plan Note (Signed)
 Hemoglobin has been stable.  Likely due to chronic kidney disease

## 2024-03-18 NOTE — Assessment & Plan Note (Addendum)
 He is not interested in prostate radiation but is open to palliative radiation to bone lesions.  He was evaluated radiation oncology and was recommend to get bone scan, If he has progression, plan palliative RT.  He has castration resistant disease.   Patient reports back pain has resolved, declined further bone scan and radiation.   Xgeva  Q 2 months.

## 2024-03-31 ENCOUNTER — Other Ambulatory Visit: Payer: Self-pay | Admitting: Physician Assistant

## 2024-04-01 ENCOUNTER — Ambulatory Visit: Admission: RE | Admit: 2024-04-01 | Discharge: 2024-04-01 | Attending: Oncology

## 2024-04-01 DIAGNOSIS — N2889 Other specified disorders of kidney and ureter: Secondary | ICD-10-CM | POA: Diagnosis not present

## 2024-04-01 DIAGNOSIS — E279 Disorder of adrenal gland, unspecified: Secondary | ICD-10-CM | POA: Insufficient documentation

## 2024-04-01 DIAGNOSIS — C7951 Secondary malignant neoplasm of bone: Secondary | ICD-10-CM | POA: Insufficient documentation

## 2024-04-01 DIAGNOSIS — J90 Pleural effusion, not elsewhere classified: Secondary | ICD-10-CM | POA: Insufficient documentation

## 2024-04-01 DIAGNOSIS — R350 Frequency of micturition: Secondary | ICD-10-CM | POA: Insufficient documentation

## 2024-04-01 DIAGNOSIS — C61 Malignant neoplasm of prostate: Secondary | ICD-10-CM | POA: Diagnosis present

## 2024-04-01 MED ORDER — FLOTUFOLASTAT F 18 GALLIUM 296-5846 MBQ/ML IV SOLN
8.0000 | Freq: Once | INTRAVENOUS | Status: AC
  Administered 2024-04-01: 13:00:00 8.41 via INTRAVENOUS
  Filled 2024-04-01: qty 8

## 2024-04-04 ENCOUNTER — Ambulatory Visit: Payer: Self-pay | Admitting: Oncology

## 2024-04-06 NOTE — Telephone Encounter (Signed)
 Called and left VM on [name]'s phone (daughter). Informed her that Dr. Babara would like an appt set up in 2-3 weeks to discuss rising PSA and PET scan results. CB# provided for her to call and set up MD appt.

## 2024-04-06 NOTE — Telephone Encounter (Signed)
-----   Message from Zelphia Cap, MD sent at 04/04/2024  8:15 PM EST ----- Please let patient and daughter know that his PSA has increased and there are some PET scan findings I would like to further discuss with them. Please add MD visit in next 2-3 weeks  Thanks.

## 2024-04-07 ENCOUNTER — Other Ambulatory Visit: Payer: Self-pay

## 2024-04-08 ENCOUNTER — Other Ambulatory Visit: Payer: Self-pay

## 2024-04-13 ENCOUNTER — Other Ambulatory Visit (HOSPITAL_COMMUNITY): Payer: Self-pay

## 2024-04-15 ENCOUNTER — Other Ambulatory Visit: Payer: Self-pay

## 2024-04-20 ENCOUNTER — Other Ambulatory Visit: Payer: Self-pay

## 2024-04-20 ENCOUNTER — Other Ambulatory Visit: Payer: Self-pay | Admitting: Pharmacy Technician

## 2024-04-20 NOTE — Progress Notes (Signed)
 Specialty Pharmacy Refill Coordination Note  Dennis Savage is a 89 y.o. male contacted today regarding refills of specialty medication(s) Enzalutamide  (XTANDI )  Spoke with Daughter & request to [place].  Patient requested Delivery   Delivery date: 04/21/24   Verified address: [address]  [place]   Medication will be filled on: 04/20/24

## 2024-05-08 ENCOUNTER — Inpatient Hospital Stay: Attending: Oncology | Admitting: Oncology

## 2024-05-08 ENCOUNTER — Encounter: Payer: Self-pay | Admitting: Oncology

## 2024-05-08 ENCOUNTER — Other Ambulatory Visit: Payer: Self-pay

## 2024-05-08 VITALS — BP 119/60 | HR 68 | Temp 97.7°F | Resp 18 | Wt 192.0 lb

## 2024-05-08 DIAGNOSIS — C61 Malignant neoplasm of prostate: Secondary | ICD-10-CM | POA: Diagnosis not present

## 2024-05-08 DIAGNOSIS — N1831 Chronic kidney disease, stage 3a: Secondary | ICD-10-CM | POA: Diagnosis not present

## 2024-05-08 DIAGNOSIS — D649 Anemia, unspecified: Secondary | ICD-10-CM | POA: Diagnosis not present

## 2024-05-08 DIAGNOSIS — M899 Disorder of bone, unspecified: Secondary | ICD-10-CM

## 2024-05-08 MED ORDER — ENZALUTAMIDE 40 MG PO TABS
160.0000 mg | ORAL_TABLET | Freq: Every day | ORAL | 2 refills | Status: AC
  Filled 2024-05-08 – 2024-05-18 (×3): qty 120, 30d supply, fill #0

## 2024-05-08 NOTE — Assessment & Plan Note (Addendum)
 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  Castration-resistant Patient declined prostate biopsy.   Recommend patient to continue androgen deprivation therapy with Orgovyx   PET scan results were reviewed with patient and daughter. Mixed response.  increased size and activity of the right midshaft humeral, persistent activity at right external iliac node. He declined radiation. PSA is trending up.  He is on dose reduced Xtandi  120 mg daily.- so far he tolerated well.  He agrees with trying Xtandi  160mg  daily.   Continue the current regimen.   Repeat PSMA in Dec 2025

## 2024-05-09 ENCOUNTER — Encounter: Payer: Self-pay | Admitting: Oncology

## 2024-05-09 NOTE — Progress Notes (Signed)
 " Hematology/Oncology Progress note [phone] [phone]        REFERRING PROVIDER: Rudolpho Norleen BIRCH, MD    CHIEF COMPLAINTS/PURPOSE OF CONSULTATION:  Prostate cancer  ASSESSMENT & PLAN:   Prostate cancer (HCC) Presumed stage IVA prostate cancer with nodal metastatic disease to the right iliac, common iliac, external, and internal iliacs, along with a perirectal lesion.  Castration-resistant Patient declined prostate biopsy.   Recommend patient to continue androgen deprivation therapy with Orgovyx   PET scan results were reviewed with patient and daughter. Mixed response.  increased size and activity of the right midshaft humeral, persistent activity at right external iliac node. He declined radiation. PSA is trending up.  He is on dose reduced Xtandi  120 mg daily.- so far he tolerated well.  He agrees with trying Xtandi  160mg  daily.   Continue the current regimen.   Repeat PSMA in Dec 2025  Bone lesion He is not interested in prostate radiation, and initially he was interested in palliative radiation to bone lesions.  Today he declined palliative radiation to progressive bone lesions.  Xgeva  Q 2-3 months.  CKD stage 3a, GFR 45-59 ml/min (HCC) Encourage oral hydration and avoid nephrotoxins.    Normocytic anemia Hemoglobin has been stable.  Likely due to chronic kidney disease   No orders of the defined types were placed in this encounter.  Follow-up 2 month. All questions were answered. The patient knows to call the clinic with any problems, questions or concerns.  Zelphia Cap, MD, PhD [facility] Hematology Oncology 05/08/2024    HISTORY OF PRESENTING ILLNESS:  Dennis Savage 89 y.o. male presents to establish care for prostate cancer I have reviewed his chart and materials related to his cancer extensively and collaborated history with the patient. Summary of oncologic history is as follows: Oncology History  Prostate cancer (HCC)  02/09/2022  Imaging   PSMA PET scan showed 1. No discrete prostate carcinoma within the prostate gland. 2. PSMA avid prostate cancer nodal metastasis to the RIGHT iliac vessels including common iliac, external iliac and internal iliac. 3. Probable serosal implant along the RIGHT border of the rectum. 4. No evidence of metastatic adenopathy outside the pelvis. No visceral metastasis. 5. No evidence of skeletal metastasis. 6. interval enlargement of LEFT adrenal mass. Indeterminate on comparison MRI. Low FDG activity. No PSMA PET activity.   06/13/2022 Tumor Marker   PSA 0.2   01/18/2023 Tumor Marker   PSA 41.7.   05/22/2023 Tumor Marker   PSA 3.7   10/04/2023 Tumor Marker   PSA 55.7   10/16/2023 Initial Diagnosis   Prostate cancer [facility]) Previously managed by urology.  Dr. Penne Patient has presumed stage IV prostate cancer, nodal metastatic disease to the right iliac, common iliac, external, and internal iliacs, along with a perirectal lesion, elected to defer biopsy.  Patient has been on androgen deprivation with Orgovyx    10/16/2023 Cancer Staging   Staging form: Prostate, AJCC 8th Edition - Clinical stage from 10/16/2023: Stage Unknown (rcTX, rcNX) - Signed by Cap Zelphia, MD on 10/16/2023 Stage prefix: Recurrence   11/06/2023 Imaging   PSMA PET scan showed 1. New intense radiotracer activity in the RIGHT lobe of the prostate gland consistent with local prostate adenocarcinoma recurrence. 2. New intense radiotracer activity at the level of the RIGHT seminal vesicle consistent with local seminal vesicle invasion. 3. New large retroperitoneal lymph node LEFT of the aorta with mild radiotracer activity. Findings concerning for nodal metastasis. 4. Several new small radiotracer avid SKELETAL METASTASIS.  5. Interval resolution of radiotracer avid RIGHT iliac nodes. 6. Stable irregular mass extending from the lower pole of the RIGHT kidney. Stable marked enlargement LEFT adrenal gland.  Findings concerning for renal and adrenal neoplasm. 7. Small bilateral pleural effusions.   04/01/2024 Imaging   PSMA PET scan showed  1. Overall positive response to therapy with resolution of PSMA activity within the prostate bed and resolution of PSMA-avid periaortic metastatic lymph nodes. 2. Persistent PSMA-avid right external iliac lymph node. 3. Multifocal PSMA-avid skeletal metastases, essentially stable except for increased size and activity of the right midshaft humeral metastasis. 4. Left pleural thickening with small left pleural effusion, and no PSMA-avid new mediastinal lymph nodes. 5. Stable left adrenal mass and stable solid right renal mass     Patient has a past medical history of sleep apnea, arthritis, atrial fibrillation on Xarelto , history of CHF, COPD, GERD, interstitial lung disease peripheral vascular disease.  he also has a small right lower pole renal mass and left adrenal mass on surveillance. 04/22/2023, CT abdomen pelvis without contrast showed enlarging left supra renal mass, concerning for relatively indolent adrenal neoplasm.  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  Patient has complained dysuria symptoms.  He takes Azo, previously urinary and urinalysis were both negative.  Accompanied by his daughter. + Back pain.  He takes Tylenol  as needed.  INTERVAL HISTORY Dennis Savage is a 89 y.o. male who has above history reviewed by me today presents for follow up visit for metastatic prostate cancer.  11/25/2023, started on dose reduced Xtandi  120 mg daily.  He tolerated well.  No new complaints. Back pain has resolved.    MEDICAL HISTORY:  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   Apnea, sleep 07/13/2015   Arthralgia of multiple joints 06/15/2011   Arthritis    Artificial lens present  12/08/2010   Atrial fibrillation (HCC)    Benign essential HTN 11/03/2010   CHF (congestive heart failure) (HCC)    Chronic obstructive pulmonary disease (HCC) 11/03/2010   CN (constipation) 06/15/2011   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   Dizziness 02/25/2015   GERD (gastroesophageal reflux disease)    Heart murmur    Hypertension    Interstitial lung disease (HCC) 10/13/2013   LBP (low back pain) 11/03/2010   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   Peripheral vascular disease 11/03/2010   Retinal hemorrhage 03/16/2014    SURGICAL HISTORY: Past Surgical History:  Procedure Laterality Date   APPENDECTOMY     BACK SURGERY     CARPAL TUNNEL RELEASE Bilateral    EYE SURGERY Bilateral    Cataract Extraction with IOL   HERNIA REPAIR     Umbilical Hernia X 3   JOINT REPLACEMENT     bilat knees   NASAL SINUS SURGERY     REPLACEMENT TOTAL KNEE Bilateral    SHOULDER SURGERY Right    TONSILLECTOMY     TOTAL HIP ARTHROPLASTY Right 10/12/2015   Procedure: TOTAL HIP ARTHROPLASTY;  Surgeon: Lynwood SHAUNNA Hue, MD;  Location: [facility] ORS;  Service: Orthopedics;  Laterality: Right;   TOTAL HIP ARTHROPLASTY Left 02/15/2023   Procedure: TOTAL HIP ARTHROPLASTY;  Surgeon: Hue Lynwood SHAUNNA, MD;  Location: [facility] ORS;  Service: Orthopedics;  Laterality: Left;    SOCIAL HISTORY: Social History   Socioeconomic History   Marital status: Single    Spouse name: Not on  file   Number of children: Not on file   Years of education: Not on file   Highest education level: Not on file  Occupational History   Not on file  Tobacco Use   Smoking status: Former    Current packs/day: 1.00    Types: Cigarettes   Smokeless tobacco: Never  Vaping Use   Vaping status: Never Used  Substance and Sexual Activity   Alcohol use: No   Drug use: No   Sexual activity: Not Currently  Other Topics Concern   Not on file  Social History Narrative   Not on file   Social Drivers of Health   Tobacco Use: Medium Risk (05/08/2024)    Patient History    Smoking Tobacco Use: Former    Smokeless Tobacco Use: Never    Passive Exposure: Not on file  Financial Resource Strain: Low Risk  (02/05/2024)   Received from [facility]   Overall Financial Resource Strain (CARDIA)    Difficulty of Paying Living Expenses: Not hard at all  Food Insecurity: No Food Insecurity (02/05/2024)   Received from [facility]   Epic    Within the past 12 months, you worried that your food would run out before you got the money to buy more.: Never true    Within the past 12 months, the food you bought just didn't last and you didn't have money to get more.: Never true  Transportation Needs: No Transportation Needs (02/05/2024)   Received from [facility] - Transportation    In the past 12 months, has lack of transportation kept you from medical appointments or from getting medications?: No    Lack of Transportation (Non-Medical): No  Physical Activity: Not on file  Stress: Not on file  Social Connections: Moderately Isolated (08/10/2023)   Social Connection and Isolation Panel    Frequency of Communication with Friends and Family: More than three times a week    Frequency of Social Gatherings with Friends and Family: More than three times a week    Attends Religious Services: More than 4 times per year    Active Member of [employer] or Organizations: No    Attends Banker Meetings: Never    Marital Status: Divorced  Catering Manager Violence: Not At Risk (10/16/2023)   Epic    Fear of Current or Ex-Partner: No    Emotionally Abused: No    Physically Abused: No    Sexually Abused: No  Depression (PHQ2-9): Low Risk (01/15/2024)   Depression (PHQ2-9)    PHQ-2 Score: 0  Alcohol Screen: Not on file  Housing: Low Risk  (02/05/2024)   Received from [facility]   Epic    In the last 12 months, was there a time when you were not able to pay the mortgage or  rent on time?: No    In the past 12 months, how many times have you moved where you were living?: 0    At any time in the past 12 months, were you homeless or living in a shelter (including now)?: No  Utilities: Not At Risk (02/05/2024)   Received from [facility]   Epic    In the past 12 months has the electric, gas, oil, or water company threatened to shut off services in your home?: No  Health Literacy: Not on file    FAMILY HISTORY: Family History  Problem Relation Age of Onset  Bladder Cancer Neg Hx    Kidney cancer Neg Hx    Prostate cancer Neg Hx     ALLERGIES:  is allergic to lisinopril and penicillins.  MEDICATIONS:  Current Outpatient Medications  Medication Sig Dispense Refill   acetaminophen  (TYLENOL ) 325 MG tablet Take 650 mg by mouth every 6 (six) hours as needed.     albuterol  (VENTOLIN  HFA) 108 (90 Base) MCG/ACT inhaler Inhale 2 puffs into the lungs every 6 (six) hours as needed for wheezing or shortness of breath.     apixaban (ELIQUIS) 2.5 MG TABS tablet Take 2.5 mg by mouth.     Calcium  Carb-Cholecalciferol  (CALCIUM  600 + D PO) Take 1 tablet by mouth daily.     cholecalciferol  (VITAMIN D3) 25 MCG (1000 UNIT) tablet Take 1,000 Units by mouth daily.     docusate sodium  (COLACE) 100 MG capsule Take 100 mg by mouth 2 (two) times daily.     fluticasone  (FLONASE ) 50 MCG/ACT nasal spray Place 1 spray into both nostrils 2 (two) times daily.     furosemide  (LASIX ) 40 MG tablet Take 1 tablet (40 mg total) by mouth daily.     hydrOXYzine  (ATARAX ) 10 MG tablet Take 10 mg by mouth at bedtime as needed for itching.     Lactulose  20 GM/30ML SOLN Take 30 mLs (20 g total) by mouth daily as needed. 450 mL 0   losartan  (COZAAR ) 25 MG tablet Take 0.5 tablets (12.5 mg total) by mouth daily.     metoprolol  succinate (TOPROL -XL) 25 MG 24 hr tablet Take 0.5 tablets (12.5 mg total) by mouth daily.     Multiple Vitamins-Minerals (PRESERVISION AREDS 2 PO) Take 1 capsule  by mouth 2 (two) times daily.     Omega-3 Fatty Acids (FISH OIL) 300 MG CAPS Take 300 mg by mouth daily.     omeprazole (PRILOSEC) 20 MG capsule Take 20 mg by mouth daily.     polyethylene glycol (MIRALAX  / GLYCOLAX ) 17 g packet Take 17 g by mouth daily. 14 each 0   relugolix  (ORGOVYX ) 120 MG tablet Take 1 tablet (120 mg total) by mouth daily. 30 tablet 3   spironolactone  (ALDACTONE ) 25 MG tablet Take 0.5 tablets (12.5 mg total) by mouth daily.     tamsulosin  (FLOMAX ) 0.4 MG CAPS capsule Take 1 capsule (0.4 mg total) by mouth daily.     Vibegron  (GEMTESA ) 75 MG TABS Take one tablet every other day     Vibegron  (GEMTESA ) 75 MG TABS Take 1 tablet (75 mg total) by mouth daily. 30 tablet 3   enzalutamide  (XTANDI ) 40 MG tablet Take 4 tablets (160 mg total) by mouth daily. 120 tablet 2   No current facility-administered medications for this visit.    Review of Systems  Constitutional:  Positive for fatigue. Negative for appetite change, chills, fever and unexpected weight change.  HENT:   Negative for hearing loss and voice change.   Eyes:  Negative for eye problems and icterus.  Respiratory:  Negative for chest tightness, cough and shortness of breath.   Cardiovascular:  Positive for leg swelling. Negative for chest pain.  Gastrointestinal:  Negative for abdominal distention and abdominal pain.  Endocrine: Negative for hot flashes.  Genitourinary:  Negative for difficulty urinating, dysuria and frequency.   Musculoskeletal:  Negative for arthralgias and back pain.  Skin:  Negative for itching and rash.  Neurological:  Negative for light-headedness and numbness.  Hematological:  Negative for adenopathy. Does not bruise/bleed easily.  Psychiatric/Behavioral:  Negative  for confusion.      PHYSICAL EXAMINATION: ECOG PERFORMANCE STATUS: 3 - Symptomatic, >50% confined to bed  Vitals:   05/08/24 1056  BP: 119/60  Pulse: 68  Resp: 18  Temp: 97.7 F (36.5 C)  SpO2: 98%   Filed Weights    05/08/24 1056  Weight: 192 lb (87.1 kg)    Physical Exam Constitutional:      General: He is not in acute distress.    Comments: Patient states in the wheelchair  HENT:     Head: Normocephalic and atraumatic.  Eyes:     General: No scleral icterus. Cardiovascular:     Rate and Rhythm: Normal rate and regular rhythm.  Pulmonary:     Effort: Pulmonary effort is normal. No respiratory distress.     Breath sounds: No wheezing.     Comments: Decreased breath sound bilaterally Abdominal:     General: There is no distension.     Palpations: Abdomen is soft.  Musculoskeletal:        General: Normal range of motion.     Cervical back: Normal range of motion and neck supple.     Right lower leg: Edema present.     Left lower leg: Edema present.  Skin:    Findings: No erythema.  Neurological:     Mental Status: He is alert. Mental status is at baseline.     Cranial Nerves: No cranial nerve deficit.     Motor: No abnormal muscle tone.  Psychiatric:        Mood and Affect: Mood and affect normal.      LABORATORY DATA:  I have reviewed the data as listed    Latest Ref Rng & Units 03/18/2024   12:36 PM 01/15/2024   10:46 AM 12/18/2023    9:48 AM  CBC  WBC 4.0 - 10.5 K/uL 8.3  6.3  6.5   Hemoglobin 13.0 - 17.0 g/dL 85.8  87.7  87.8   Hematocrit 39.0 - 52.0 % 43.5  37.9  39.1   Platelets 150 - 400 K/uL 183  181  202       Latest Ref Rng & Units 03/18/2024   12:36 PM 01/15/2024   10:46 AM 12/18/2023    9:49 AM  CMP  Glucose 70 - 99 mg/dL 96  97  880   BUN 8 - 23 mg/dL 41  43  37   Creatinine 0.61 - 1.24 mg/dL 8.21  8.25  8.48   Sodium 135 - 145 mmol/L 140  135  140   Potassium 3.5 - 5.1 mmol/L 4.4  3.9  4.4   Chloride 98 - 111 mmol/L 105  106  108   CO2 22 - 32 mmol/L 23  21  24    Calcium  8.9 - 10.3 mg/dL 9.3  9.5  9.4   Total Protein 6.5 - 8.1 g/dL 6.6  6.4  5.9   Total Bilirubin 0.0 - 1.2 mg/dL 0.7  1.4  1.2   Alkaline Phos 38 - 126 U/L 70  67  75   AST 15 - 41 U/L 21  23   22    ALT 0 - 44 U/L 9  10  10       RADIOGRAPHIC STUDIES: I have personally reviewed the radiological images as listed and agreed with the findings in the report. No results found.   "

## 2024-05-09 NOTE — Assessment & Plan Note (Signed)
 Hemoglobin has been stable.  Likely due to chronic kidney disease

## 2024-05-09 NOTE — Assessment & Plan Note (Signed)
 Encourage oral hydration and avoid nephrotoxins.

## 2024-05-09 NOTE — Assessment & Plan Note (Addendum)
 He is not interested in prostate radiation, and initially he was interested in palliative radiation to bone lesions.  Today he declined palliative radiation to progressive bone lesions.  Xgeva  Q 2-3 months.

## 2024-05-11 ENCOUNTER — Other Ambulatory Visit: Payer: Self-pay

## 2024-05-13 ENCOUNTER — Other Ambulatory Visit: Payer: Self-pay

## 2024-05-15 ENCOUNTER — Other Ambulatory Visit: Payer: Self-pay

## 2024-05-18 ENCOUNTER — Other Ambulatory Visit: Payer: Self-pay

## 2024-05-18 NOTE — Progress Notes (Signed)
 Specialty Pharmacy Refill Coordination Note  Dennis Savage is a 89 y.o. male contacted today regarding refills of specialty medication(s) Enzalutamide  (XTANDI )   Patient requested Delivery   Delivery date: 05/20/24   Verified address: [address] [place]   Medication will be filled on: 05/18/24

## 2024-05-20 ENCOUNTER — Inpatient Hospital Stay

## 2024-05-20 ENCOUNTER — Inpatient Hospital Stay: Admitting: Oncology

## 2024-06-10 ENCOUNTER — Inpatient Hospital Stay

## 2024-06-10 ENCOUNTER — Inpatient Hospital Stay: Admitting: Oncology

## 2024-06-26 ENCOUNTER — Other Ambulatory Visit

## 2024-07-01 ENCOUNTER — Ambulatory Visit: Admitting: Urology

## 2024-07-02 ENCOUNTER — Ambulatory Visit: Admitting: Urology
